# Patient Record
Sex: Female | Born: 1985 | Race: White | Hispanic: No | Marital: Married | Smoking: Former smoker
Health system: Southern US, Community
[De-identification: ages and names within clinical notes are randomized; demographics above are authoritative.]

## PROBLEM LIST (undated history)

## (undated) ENCOUNTER — Inpatient Hospital Stay (HOSPITAL_COMMUNITY): Payer: Self-pay

## (undated) DIAGNOSIS — G43909 Migraine, unspecified, not intractable, without status migrainosus: Secondary | ICD-10-CM

## (undated) DIAGNOSIS — E669 Obesity, unspecified: Secondary | ICD-10-CM

## (undated) DIAGNOSIS — D649 Anemia, unspecified: Secondary | ICD-10-CM

## (undated) DIAGNOSIS — N83209 Unspecified ovarian cyst, unspecified side: Secondary | ICD-10-CM

## (undated) DIAGNOSIS — I729 Aneurysm of unspecified site: Secondary | ICD-10-CM

## (undated) DIAGNOSIS — E079 Disorder of thyroid, unspecified: Secondary | ICD-10-CM

## (undated) DIAGNOSIS — E039 Hypothyroidism, unspecified: Secondary | ICD-10-CM

## (undated) DIAGNOSIS — N63 Unspecified lump in unspecified breast: Secondary | ICD-10-CM

## (undated) DIAGNOSIS — B9689 Other specified bacterial agents as the cause of diseases classified elsewhere: Secondary | ICD-10-CM

## (undated) DIAGNOSIS — N760 Acute vaginitis: Secondary | ICD-10-CM

## (undated) DIAGNOSIS — F32A Depression, unspecified: Secondary | ICD-10-CM

## (undated) DIAGNOSIS — F329 Major depressive disorder, single episode, unspecified: Secondary | ICD-10-CM

## (undated) DIAGNOSIS — K760 Fatty (change of) liver, not elsewhere classified: Secondary | ICD-10-CM

## (undated) DIAGNOSIS — N644 Mastodynia: Secondary | ICD-10-CM

## (undated) DIAGNOSIS — E063 Autoimmune thyroiditis: Secondary | ICD-10-CM

## (undated) DIAGNOSIS — N926 Irregular menstruation, unspecified: Secondary | ICD-10-CM

## (undated) DIAGNOSIS — Z3490 Encounter for supervision of normal pregnancy, unspecified, unspecified trimester: Secondary | ICD-10-CM

## (undated) DIAGNOSIS — K5090 Crohn's disease, unspecified, without complications: Secondary | ICD-10-CM

## (undated) DIAGNOSIS — M8580 Other specified disorders of bone density and structure, unspecified site: Secondary | ICD-10-CM

## (undated) DIAGNOSIS — M4850XA Collapsed vertebra, not elsewhere classified, site unspecified, initial encounter for fracture: Secondary | ICD-10-CM

## (undated) HISTORY — DX: Acute vaginitis: N76.0

## (undated) HISTORY — PX: COLONOSCOPY: SHX174

## (undated) HISTORY — DX: Primary biliary cirrhosis: K74.3

## (undated) HISTORY — DX: Unspecified lump in unspecified breast: N63.0

## (undated) HISTORY — DX: Other specified bacterial agents as the cause of diseases classified elsewhere: B96.89

## (undated) HISTORY — PX: WISDOM TOOTH EXTRACTION: SHX21

## (undated) HISTORY — DX: Autoimmune thyroiditis: E06.3

## (undated) HISTORY — DX: Unspecified ovarian cyst, unspecified side: N83.209

## (undated) HISTORY — DX: Other specified disorders of bone density and structure, unspecified site: M85.80

## (undated) HISTORY — DX: Irregular menstruation, unspecified: N92.6

## (undated) HISTORY — DX: Obesity, unspecified: E66.9

## (undated) HISTORY — DX: Mastodynia: N64.4

## (undated) HISTORY — DX: Fatty (change of) liver, not elsewhere classified: K76.0

## (undated) HISTORY — DX: Aneurysm of unspecified site: I72.9

## (undated) HISTORY — PX: TONSILLECTOMY: SUR1361

## (undated) HISTORY — DX: Hypothyroidism, unspecified: E03.9

## (undated) HISTORY — DX: Disorder of thyroid, unspecified: E07.9

## (undated) HISTORY — DX: Encounter for supervision of normal pregnancy, unspecified, unspecified trimester: Z34.90

## (undated) HISTORY — DX: Collapsed vertebra, not elsewhere classified, site unspecified, initial encounter for fracture: M48.50XA

## (undated) HISTORY — DX: Crohn's disease, unspecified, without complications: K50.90

## (undated) HISTORY — DX: Migraine, unspecified, not intractable, without status migrainosus: G43.909

## (undated) HISTORY — DX: Anemia, unspecified: D64.9

---

## 2007-07-23 ENCOUNTER — Other Ambulatory Visit: Admission: RE | Admit: 2007-07-23 | Discharge: 2007-07-23 | Payer: Self-pay | Admitting: Obstetrics and Gynecology

## 2009-10-09 ENCOUNTER — Other Ambulatory Visit: Admission: RE | Admit: 2009-10-09 | Discharge: 2009-10-09 | Payer: Self-pay | Admitting: Obstetrics & Gynecology

## 2009-11-03 ENCOUNTER — Ambulatory Visit (HOSPITAL_COMMUNITY): Admission: RE | Admit: 2009-11-03 | Discharge: 2009-11-03 | Payer: Self-pay | Admitting: Obstetrics & Gynecology

## 2009-11-08 ENCOUNTER — Ambulatory Visit (HOSPITAL_COMMUNITY): Admission: RE | Admit: 2009-11-08 | Discharge: 2009-11-08 | Payer: Self-pay | Admitting: Obstetrics & Gynecology

## 2009-12-11 ENCOUNTER — Ambulatory Visit (HOSPITAL_COMMUNITY): Admission: RE | Admit: 2009-12-11 | Discharge: 2009-12-11 | Payer: Self-pay | Admitting: Obstetrics & Gynecology

## 2009-12-26 ENCOUNTER — Ambulatory Visit (HOSPITAL_COMMUNITY): Admission: RE | Admit: 2009-12-26 | Discharge: 2009-12-26 | Payer: Self-pay | Admitting: Obstetrics & Gynecology

## 2009-12-27 ENCOUNTER — Inpatient Hospital Stay (HOSPITAL_COMMUNITY): Admission: AD | Admit: 2009-12-27 | Discharge: 2009-12-28 | Payer: Self-pay | Admitting: Obstetrics and Gynecology

## 2010-01-09 ENCOUNTER — Ambulatory Visit (HOSPITAL_COMMUNITY)
Admission: RE | Admit: 2010-01-09 | Discharge: 2010-01-09 | Payer: Self-pay | Source: Home / Self Care | Admitting: Dentistry

## 2010-01-23 ENCOUNTER — Ambulatory Visit (HOSPITAL_COMMUNITY)
Admission: RE | Admit: 2010-01-23 | Discharge: 2010-01-23 | Payer: Self-pay | Source: Home / Self Care | Attending: Obstetrics & Gynecology | Admitting: Obstetrics & Gynecology

## 2010-02-08 ENCOUNTER — Ambulatory Visit (HOSPITAL_COMMUNITY)
Admission: RE | Admit: 2010-02-08 | Discharge: 2010-02-08 | Payer: Self-pay | Source: Home / Self Care | Attending: Obstetrics & Gynecology | Admitting: Obstetrics & Gynecology

## 2010-02-22 ENCOUNTER — Ambulatory Visit (HOSPITAL_COMMUNITY)
Admission: RE | Admit: 2010-02-22 | Discharge: 2010-02-22 | Payer: Self-pay | Source: Home / Self Care | Attending: Obstetrics & Gynecology | Admitting: Obstetrics & Gynecology

## 2010-03-08 ENCOUNTER — Ambulatory Visit (HOSPITAL_COMMUNITY)
Admission: RE | Admit: 2010-03-08 | Discharge: 2010-03-08 | Payer: Self-pay | Source: Home / Self Care | Attending: Obstetrics & Gynecology | Admitting: Obstetrics & Gynecology

## 2010-03-08 ENCOUNTER — Other Ambulatory Visit: Payer: Self-pay | Admitting: Obstetrics & Gynecology

## 2010-03-08 DIAGNOSIS — IMO0001 Reserved for inherently not codable concepts without codable children: Secondary | ICD-10-CM

## 2010-03-29 ENCOUNTER — Ambulatory Visit (HOSPITAL_COMMUNITY): Payer: Self-pay

## 2010-03-29 ENCOUNTER — Other Ambulatory Visit: Payer: Self-pay | Admitting: Obstetrics & Gynecology

## 2010-03-29 ENCOUNTER — Ambulatory Visit (HOSPITAL_COMMUNITY)
Admission: RE | Admit: 2010-03-29 | Discharge: 2010-03-29 | Disposition: A | Discharge status: Home / Self Care | Payer: 59 | Source: Ambulatory Visit | Attending: Obstetrics & Gynecology | Admitting: Obstetrics & Gynecology

## 2010-03-29 DIAGNOSIS — O30049 Twin pregnancy, dichorionic/diamniotic, unspecified trimester: Secondary | ICD-10-CM

## 2010-03-29 DIAGNOSIS — IMO0001 Reserved for inherently not codable concepts without codable children: Secondary | ICD-10-CM

## 2010-03-29 DIAGNOSIS — Z3689 Encounter for other specified antenatal screening: Secondary | ICD-10-CM | POA: Insufficient documentation

## 2010-03-29 DIAGNOSIS — O30009 Twin pregnancy, unspecified number of placenta and unspecified number of amniotic sacs, unspecified trimester: Secondary | ICD-10-CM | POA: Insufficient documentation

## 2010-03-29 DIAGNOSIS — O26849 Uterine size-date discrepancy, unspecified trimester: Secondary | ICD-10-CM

## 2010-04-03 ENCOUNTER — Inpatient Hospital Stay (HOSPITAL_COMMUNITY)
Admission: AD | Admit: 2010-04-03 | Discharge: 2010-04-03 | Disposition: A | Discharge status: Home / Self Care | Payer: 59 | Source: Ambulatory Visit | Attending: Obstetrics & Gynecology | Admitting: Obstetrics & Gynecology

## 2010-04-03 DIAGNOSIS — F410 Panic disorder [episodic paroxysmal anxiety] without agoraphobia: Secondary | ICD-10-CM

## 2010-04-03 DIAGNOSIS — O99340 Other mental disorders complicating pregnancy, unspecified trimester: Secondary | ICD-10-CM

## 2010-04-19 ENCOUNTER — Ambulatory Visit (HOSPITAL_COMMUNITY)
Admission: RE | Admit: 2010-04-19 | Discharge: 2010-04-19 | Disposition: A | Discharge status: Home / Self Care | Payer: 59 | Source: Ambulatory Visit | Attending: Obstetrics & Gynecology | Admitting: Obstetrics & Gynecology

## 2010-04-19 DIAGNOSIS — IMO0001 Reserved for inherently not codable concepts without codable children: Secondary | ICD-10-CM

## 2010-04-19 DIAGNOSIS — O30009 Twin pregnancy, unspecified number of placenta and unspecified number of amniotic sacs, unspecified trimester: Secondary | ICD-10-CM | POA: Insufficient documentation

## 2010-04-19 DIAGNOSIS — Z3689 Encounter for other specified antenatal screening: Secondary | ICD-10-CM | POA: Insufficient documentation

## 2010-04-24 LAB — GC/CHLAMYDIA PROBE AMP, GENITAL
Chlamydia, DNA Probe: NEGATIVE
GC Probe Amp, Genital: NEGATIVE

## 2010-04-24 LAB — URINALYSIS, ROUTINE W REFLEX MICROSCOPIC
Bilirubin Urine: NEGATIVE
Glucose, UA: NEGATIVE mg/dL
Hgb urine dipstick: NEGATIVE
Specific Gravity, Urine: 1.01 (ref 1.005–1.030)
pH: 6 (ref 5.0–8.0)

## 2010-04-24 LAB — URINE MICROSCOPIC-ADD ON

## 2010-04-24 LAB — WET PREP, GENITAL

## 2010-04-30 ENCOUNTER — Other Ambulatory Visit (HOSPITAL_COMMUNITY): Payer: 59

## 2010-05-01 ENCOUNTER — Other Ambulatory Visit (HOSPITAL_COMMUNITY): Payer: 59

## 2010-05-01 ENCOUNTER — Encounter (HOSPITAL_COMMUNITY)
Admission: RE | Admit: 2010-05-01 | Discharge: 2010-05-01 | Disposition: A | Discharge status: Home / Self Care | Payer: 59 | Source: Ambulatory Visit | Attending: Obstetrics and Gynecology | Admitting: Obstetrics and Gynecology

## 2010-05-01 DIAGNOSIS — Z01812 Encounter for preprocedural laboratory examination: Secondary | ICD-10-CM | POA: Insufficient documentation

## 2010-05-01 LAB — CBC
HCT: 31.9 % — ABNORMAL LOW (ref 36.0–46.0)
Hemoglobin: 9.5 g/dL — ABNORMAL LOW (ref 12.0–15.0)
WBC: 11.1 10*3/uL — ABNORMAL HIGH (ref 4.0–10.5)

## 2010-05-01 LAB — RPR: RPR Ser Ql: NONREACTIVE

## 2010-05-01 LAB — SURGICAL PCR SCREEN: MRSA, PCR: NEGATIVE

## 2010-05-04 ENCOUNTER — Inpatient Hospital Stay (HOSPITAL_COMMUNITY)
Admission: RE | Admit: 2010-05-04 | Discharge: 2010-05-07 | DRG: 766 | Disposition: A | Discharge status: Home / Self Care | Payer: 59 | Source: Ambulatory Visit | Attending: Obstetrics and Gynecology | Admitting: Obstetrics and Gynecology

## 2010-05-04 ENCOUNTER — Inpatient Hospital Stay (HOSPITAL_COMMUNITY): Admission: RE | Admit: 2010-05-04 | Payer: 59 | Source: Ambulatory Visit | Admitting: Obstetrics and Gynecology

## 2010-05-04 DIAGNOSIS — O30039 Twin pregnancy, monochorionic/diamniotic, unspecified trimester: Secondary | ICD-10-CM

## 2010-05-04 DIAGNOSIS — Z01812 Encounter for preprocedural laboratory examination: Secondary | ICD-10-CM

## 2010-05-04 DIAGNOSIS — O3090 Multiple gestation, unspecified, unspecified trimester: Principal | ICD-10-CM | POA: Diagnosis present

## 2010-05-04 DIAGNOSIS — Z01818 Encounter for other preprocedural examination: Secondary | ICD-10-CM

## 2010-05-04 DIAGNOSIS — O30009 Twin pregnancy, unspecified number of placenta and unspecified number of amniotic sacs, unspecified trimester: Secondary | ICD-10-CM

## 2010-05-04 DIAGNOSIS — O329XX0 Maternal care for malpresentation of fetus, unspecified, not applicable or unspecified: Secondary | ICD-10-CM

## 2010-05-04 LAB — TYPE AND SCREEN
ABO/RH(D): O POS
Antibody Screen: NEGATIVE

## 2010-05-05 LAB — CBC
MCH: 23.3 pg — ABNORMAL LOW (ref 26.0–34.0)
MCHC: 30 g/dL (ref 30.0–36.0)
Platelets: 160 10*3/uL (ref 150–400)
RDW: 16 % — ABNORMAL HIGH (ref 11.5–15.5)

## 2010-05-06 NOTE — H&P (Signed)
+NAME:  [name], [name] NO.:  [id number]  MEDICAL RECORD NO.:  [id number]           PATIENT TYPE:  LOCATION:                                [facility]  [facility]  PHYSICIAN:  Tilda Burrow, M.D. DATE OF BIRTH:  17-Jan-1986  DATE OF ADMISSION:  05/04/2010 DATE OF DISCHARGE:  LH                             HISTORY & PHYSICAL   ADMITTING DIAGNOSIS:  Twin pregnancy, 38 weeks and 1 day, diamniotic monochorionic twins with symmetric growth.  Breech baby A.  Scheduled for primary cesarean section.  HISTORY OF PRESENT ILLNESS:  This 25 year old female, gravida 1, para 0- 0-0-0, LMP is August 01, 2009, placing menstrual [facility] May 08, 2010, with first trimester ultrasound 6 weeks 4 days on September 25, 2009, indicating [facility] of May 17, 2010, with ultrasound 10 weeks and 1 day on October 17, 2009, suggesting [facility] of May 14, 2010.  She will be 38 weeks and 1 day by the most reliable earliest ultrasound, and is therefore scheduled for primary cesarean section at 38 weeks and 1 day.  Prenatal course has been followed with serial [facility] with baby A being remaining breech with estimated fetal weight of 49% at 33 weeks.  The baby B was larger at the 71 percentile. The patient is not interested in vaginal delivery even if the babies convert position.  She is, therefore, scheduled for primary cesarean section on May 04, 2010.  Risks of procedure had been reviewed with the patient.  PAST MEDICAL HISTORY:  Positive for headaches, migraines without aura. She has had cold sores orally consistent with HSV-1.  SURGICAL HISTORY:  Positive for tonsillectomy and dental extractions.  SOCIAL HISTORY:  She is a nonsmoker, nondrinker.  Denies recreational drugs.  She is single, lives with baby's father, she is unemployed.  ALLERGIES:  She has an allergy to MAXALT and IMITREX.  MEDICATIONS:  Celexa, intermittently taking naproxen prior to  pregnancy.  FAMILY HISTORY:  Positive for hypertension, diabetes, stroke, heart attacks, and breast cancer.  PRENATAL COURSE:  Notable for serial ultrasounds through [facility] with acceptable growth.  The discordancy and estimated fetal weight is 11% at most recent ultrasound.  PRENATAL LABS:  Blood type O+, rubella immunity present.  Hemoglobin 12, hematocrit 38.  Hepatitis, HIV, RPR, GC/Chlamydia, and hepatitis C all negative.  She has had a Pap smear that was normal.  She has had steady fundal height growth and reactive NSTs done twice weekly on the babies.  PHYSICAL EXAM:  Height 5 feet 2 inches, weight 244.2, blood pressure 104/60 with 42 to 44 cm fundal height, with breech baby A and vertex B. Reactive NSTs with cervix long, thick, and closed.  Group B strep obtained, April 23, 2010.  Extremities are without cyanosis, clubbing, or edema.  ASSESSMENT:  Pregnancy at 65 plus weeks' gestation, twin gestation, breech baby A.  Fetal malpresentation, therefore, scheduled for cesarean section.     Tilda Burrow, M.D.     JVF/MEDQ  D:  04/27/2010  T:  04/28/2010  Job:  [id number]  cc:   [facility]  Electronically Signed  by Christin Bach M.D. on 05/06/2010 09:25:14 PM

## 2010-05-07 ENCOUNTER — Other Ambulatory Visit: Payer: Self-pay | Admitting: Obstetrics and Gynecology

## 2010-05-18 ENCOUNTER — Emergency Department (HOSPITAL_COMMUNITY)
Admission: EM | Admit: 2010-05-18 | Discharge: 2010-05-18 | Disposition: A | Discharge status: Home / Self Care | Payer: 59 | Attending: Emergency Medicine | Admitting: Emergency Medicine

## 2010-05-18 DIAGNOSIS — L299 Pruritus, unspecified: Secondary | ICD-10-CM | POA: Insufficient documentation

## 2010-05-18 DIAGNOSIS — L03319 Cellulitis of trunk, unspecified: Secondary | ICD-10-CM | POA: Insufficient documentation

## 2010-05-18 DIAGNOSIS — Z79899 Other long term (current) drug therapy: Secondary | ICD-10-CM | POA: Insufficient documentation

## 2010-05-18 DIAGNOSIS — L02219 Cutaneous abscess of trunk, unspecified: Secondary | ICD-10-CM | POA: Insufficient documentation

## 2010-05-18 LAB — DIFFERENTIAL
Basophils Relative: 1 % (ref 0–1)
Eosinophils Absolute: 0.8 10*3/uL — ABNORMAL HIGH (ref 0.0–0.7)
Monocytes Relative: 5 % (ref 3–12)
Neutrophils Relative %: 49 % (ref 43–77)

## 2010-05-18 LAB — CBC
MCH: 24.7 pg — ABNORMAL LOW (ref 26.0–34.0)
Platelets: 343 10*3/uL (ref 150–400)
RBC: 4.94 MIL/uL (ref 3.87–5.11)

## 2010-05-18 LAB — BASIC METABOLIC PANEL
Calcium: 8.8 mg/dL (ref 8.4–10.5)
Chloride: 103 mEq/L (ref 96–112)
Creatinine, Ser: 0.66 mg/dL (ref 0.4–1.2)
GFR calc Af Amer: >60 mL/min (ref >60)

## 2010-05-21 NOTE — Discharge Summary (Signed)
  NAMEJENNYLEE, [name]             ACCOUNT NO.:  [id number]  MEDICAL RECORD NO.:  [id number]           PATIENT TYPE:  I  LOCATION:  9105                          [facility]  [facility]  PHYSICIAN:  Tilda Burrow, M.D. DATE OF BIRTH:  1985-10-23  DATE OF ADMISSION:  05/04/2010 DATE OF DISCHARGE:  05/07/2010                              DISCHARGE SUMMARY   REASON FOR ADMISSION:  Twin pregnancy at 38 weeks and 1 day with vertex and breech presentation for low transverse cesarean section.  PROCEDURE:  The patient had a low transverse cesarean section by Dr. Emelda Fear and Dr. Orvan Falconer with 2 viable infants, female x2 with Apgars of 8 and 9 for baby A and have Apgars of 9 and 9 for baby B.  Postop course has been uneventful.  The patient is up ambulating well, taking p.o. fluids and solids well, and is ready for discharge today. She has been afebrile.  Discharge hemoglobin is 8.1, discharge hematocrit 27, white count 12.9.  DIET:  Diet as tolerated.  ACTIVITY LEVEL:  No heavy lifting or driving for at least 2 weeks.  FOLLOWUP:  She is to follow up at [facility] in 1 week for an incision check or p.r.n. any problems.  PHYSICAL EXAMINATION:  VITAL SIGNS:  Today, vital signs were stable. CARDIAC:  Heart is regular rhythm and rate. LUNGS:  Clear to auscultation bilaterally. ABDOMEN:  Soft.  Bowel sounds present x4.  Incision is intact.  There is no redness, swelling, or drainage.  Fundus is firm.  Lochia scant amount. EXTREMITIES:  Trace edema in the lower extremities.  ASSESSMENT:  Stable postop day #3.  PLAN:  We are going to discharge her home.  Follow up with [facility] in 1 week.  DISCHARGE MEDICATIONS:  As follows: 1. Percocet 5/325 one p.o. q.4 hours p.r.n. pain. 2. Motrin 600 p.o. q.6 hours p.r.n. cramping. 3. FeSO4 325 one p.o. b.i.d.     Zerita Boers, N.M.   ______________________________ Tilda Burrow, M.D.    DL/MEDQ  D:  78/29/5621  T:  05/08/2010   Job:  [id number]  cc:   [facility]  Electronically Signed by Wyvonnia Dusky N.M. on 05/13/2010 10:33:08 AM Electronically Signed by Christin Bach M.D. on 05/21/2010 05:21:29 PM

## 2010-05-21 NOTE — Op Note (Addendum)
[name], [name]             ACCOUNT NO.:  [id number]  MEDICAL RECORD NO.:  [id number]           PATIENT TYPE:  I  LOCATION:  9105                          [facility]  PHYSICIAN:  Tilda Burrow, M.D. DATE OF BIRTH:  1985-07-24  DATE OF PROCEDURE:  05/04/2010 DATE OF DISCHARGE:                              OPERATIVE REPORT   PREOPERATIVE DIAGNOSES: 1. A twin mono-di intrauterine pregnancy at 38 weeks and 1 day. 2. Breech twin A.  POSTOPERATIVE DIAGNOSES: 1. A twin mono-di intrauterine pregnancy at 38 weeks and 1 day. 2. Breech twin A.  PROCEDURE:  Primary low transverse cesarean section via Pfannenstiel.  SURGEON:  Dr. Emelda Fear and Dr. Orvan Falconer.  ANESTHESIA:  Spinal.  IV FLUIDS:  20000 mL.  URINE OUTPUT:  100 mL of clear urine at the end of procedure.  ESTIMATED BLOOD LOSS:  1000 mL.  FINDING:  Viable female infant x2, both in frank breech presentation x2 with clear amniotic fluid x2.  Baby A Apgars were 8 and 9 with a weight 6 pounds 6 ounces, baby B Apgars 9 and 9 with a weight 6 pounds 10 ounces.  Normal uterus and adnexa bilaterally.  SPECIMENS:  Placenta was sent to Pathology.  COMPLICATIONS:  None immediate.  INDICATIONS:  A 25 year old gravida 1, para 0 with an intrauterine pregnancy with mono-di twins, breech twin A who presented with a primary low transverse cesarean section who is currently not in labor for breech presentation and twin intrauterine pregnancy.  PROCEDURE NOTE IN DETAIL:  After informed consent was obtained, the patient was taken back to the operative suite where spinal anesthesia was placed after anesthesia was found to be adequate.  The patient was placed in dorsal supine position with leftward tilt.  The patient was prepped and draped in normal sterile fashion.  Again anesthesia was tested, was found to be adequate.  A Pfannenstiel skin incision was then made with scalpel and carried down through to the underlying  fascia. The fascia was then incised in the midline.  The fascial incision then extended laterally with the Mayo scissors.  The superior aspect of the fascial incision was then grasped with Kocher clamps, elevated, tented up and the rectus muscle was then dissected off bluntly.  Attention was then turned inferiorly to the lower edge of the fascial incision which was grasped with Kocher clamps, elevated, tented up and the rectus muscle then dissected off bluntly.  The rectus muscle was then separated in the midline.  The peritoneum was then entered bluntly.  The incision was then extended manually.  The bladder blade was then inserted in the vesicouterine.  Peritoneum was then identified, grasped with pickups and entered sharply with Metzenbaum scissors.  The bladder flap was then created digitally.  The bladder blade was then reinserted.  The lower uterine segment was then incised in a transverse fashion with a scalpel. The incision was tented laterally.  The.  Amniotomy was then performed, notable with clear fluid.  Baby A was delivered in frank breech presentation and otherwise atraumatically, mouth and nose were bulb suctioned.  Cord was cut and clamped and the infant was  handed off to awaiting NICU.  Apgars were 8 and 9, weight was 6 pounds 6 ounces. Subsequently baby B's amniotomy was then performed, was also found to be in frank breech presentation, was delivered otherwise atraumatically. Mouth and nose were bulb suctioned.  Cord was cut and clamped and infant was handed off to awaiting NICU.  Apgars were 9 and 9.  Weight was 6 pounds 10 ounces.  Cord blood was then sampled from both babies. Placenta was then delivered spontaneously intact with three-vessel cord for both babies.  The uterus was then cleared of all clots and debribed. The uterine incision was then repaired using a 0 chromic in a running locking fashion.  The second layer was then imbricated in a running fashion.  The  uterine incision was found to be hemostatic.  Bilateral adnexa were then visualized, were found to be normal.  The peritoneum was then cleared of all clots and debris.  The peritoneum was then reapproximated using 2-0 Vicryl.  The rectus muscles were then reapproximated as well using the same 2-0 Vicryl with an interrupted sutures.  The fascial incision was then closed using a O Vicryl in a running fashion.  The subcutaneous tissue was then irrigated, additional hemostasis was then found with the electrocautery.  A plain gut suture was then used for reapproximation x3 of the subcutaneous tissues.  The skin was then closed using a 4-0 Vicryl in a subcuticular fashion. Fascial incision was found to be reapproximated well.  Lap, needle, instrument and sponge counts were correct x2.  The patient received antibiotics perioperatively.  The patient was taken back to the recovery room in stable condition.    ______________________________ Maryelizabeth Kaufmann, MD   ______________________________ Tilda Burrow, M.D.    LC/MEDQ  D:  05/04/2010  T:  05/05/2010  Job:  [id number]  Electronically Signed by Maryelizabeth Kaufmann MD on 06/06/2010 03:46:44 PM Electronically Signed by Christin Bach M.D. on 06/10/2010 11:59:48 AM

## 2010-11-13 ENCOUNTER — Other Ambulatory Visit: Payer: Self-pay | Admitting: Adult Health

## 2010-11-13 ENCOUNTER — Other Ambulatory Visit (HOSPITAL_COMMUNITY)
Admission: RE | Admit: 2010-11-13 | Discharge: 2010-11-13 | Disposition: A | Discharge status: Home / Self Care | Payer: 59 | Source: Ambulatory Visit | Attending: Obstetrics and Gynecology | Admitting: Obstetrics and Gynecology

## 2010-11-13 DIAGNOSIS — Z01419 Encounter for gynecological examination (general) (routine) without abnormal findings: Secondary | ICD-10-CM | POA: Insufficient documentation

## 2010-11-13 DIAGNOSIS — Z113 Encounter for screening for infections with a predominantly sexual mode of transmission: Secondary | ICD-10-CM | POA: Insufficient documentation

## 2011-07-30 ENCOUNTER — Other Ambulatory Visit: Payer: Self-pay | Admitting: Advanced Practice Midwife

## 2011-08-01 ENCOUNTER — Other Ambulatory Visit: Payer: Self-pay | Admitting: Advanced Practice Midwife

## 2012-01-22 ENCOUNTER — Other Ambulatory Visit: Payer: Self-pay | Admitting: Adult Health

## 2012-01-22 ENCOUNTER — Other Ambulatory Visit (HOSPITAL_COMMUNITY)
Admission: RE | Admit: 2012-01-22 | Discharge: 2012-01-22 | Disposition: A | Discharge status: Home / Self Care | Payer: 59 | Source: Ambulatory Visit | Attending: Obstetrics and Gynecology | Admitting: Obstetrics and Gynecology

## 2012-01-22 DIAGNOSIS — Z01419 Encounter for gynecological examination (general) (routine) without abnormal findings: Secondary | ICD-10-CM | POA: Insufficient documentation

## 2012-01-22 DIAGNOSIS — Z113 Encounter for screening for infections with a predominantly sexual mode of transmission: Secondary | ICD-10-CM | POA: Insufficient documentation

## 2012-09-10 ENCOUNTER — Encounter: Payer: Self-pay | Admitting: Adult Health

## 2012-09-10 ENCOUNTER — Ambulatory Visit (INDEPENDENT_AMBULATORY_CARE_PROVIDER_SITE_OTHER): Payer: Medicare PPO | Admitting: Adult Health

## 2012-09-10 VITALS — BP 120/80 | Ht 63.0 in | Wt 267.0 lb

## 2012-09-10 DIAGNOSIS — Z3200 Encounter for pregnancy test, result unknown: Secondary | ICD-10-CM

## 2012-09-10 DIAGNOSIS — Z3202 Encounter for pregnancy test, result negative: Secondary | ICD-10-CM

## 2012-09-10 LAB — POCT URINE PREGNANCY: Preg Test, Ur: NEGATIVE

## 2012-09-10 NOTE — Progress Notes (Signed)
Patient ID: [name], female   DOB: Nov 10, 1985, 27 y.o.   MRN: [id number] Pt here today for UPT. UPT is negative. Pt states that she has been having normal periods every month but nothing this month. Pt is not on any BC. We will do a QHCG today to be sure.

## 2012-09-11 ENCOUNTER — Telehealth: Payer: Self-pay | Admitting: Adult Health

## 2012-09-11 LAB — HCG, QUANTITATIVE, PREGNANCY: hCG, Beta Chain, Quant, S: <2 m[IU]/mL

## 2012-09-11 NOTE — Telephone Encounter (Signed)
Pt informed of negative QHCG results.

## 2012-11-10 ENCOUNTER — Ambulatory Visit: Payer: 59 | Admitting: Adult Health

## 2012-11-12 ENCOUNTER — Encounter: Payer: Self-pay | Admitting: Adult Health

## 2012-11-12 ENCOUNTER — Ambulatory Visit (INDEPENDENT_AMBULATORY_CARE_PROVIDER_SITE_OTHER): Payer: Medicare PPO | Admitting: Adult Health

## 2012-11-12 VITALS — BP 112/66 | Ht 62.0 in | Wt 270.0 lb

## 2012-11-12 DIAGNOSIS — A499 Bacterial infection, unspecified: Secondary | ICD-10-CM

## 2012-11-12 DIAGNOSIS — N760 Acute vaginitis: Secondary | ICD-10-CM

## 2012-11-12 DIAGNOSIS — N898 Other specified noninflammatory disorders of vagina: Secondary | ICD-10-CM | POA: Insufficient documentation

## 2012-11-12 DIAGNOSIS — N949 Unspecified condition associated with female genital organs and menstrual cycle: Secondary | ICD-10-CM

## 2012-11-12 DIAGNOSIS — B9689 Other specified bacterial agents as the cause of diseases classified elsewhere: Secondary | ICD-10-CM

## 2012-11-12 DIAGNOSIS — R102 Pelvic and perineal pain: Secondary | ICD-10-CM | POA: Insufficient documentation

## 2012-11-12 HISTORY — DX: Other specified bacterial agents as the cause of diseases classified elsewhere: B96.89

## 2012-11-12 LAB — POCT WET PREP (WET MOUNT)

## 2012-11-12 MED ORDER — METRONIDAZOLE 500 MG PO TABS: 500.0000 mg | ORAL_TABLET | Freq: Two times a day (BID) | ORAL | Status: DC

## 2012-11-12 NOTE — Progress Notes (Signed)
Subjective:     Patient ID: [name], female   DOB: Jan 23, 1986, 27 y.o.   MRN: [id number]  HPI [name] is a 27 year old white female in complaining of vaginal pain and tampons hurt.She says sex is uncomfortable too, she missed a period in July then had one in august and then September heavy. Uses condoms.  Review of Systems See HPI Reviewed past medical,surgical, social and family history. Reviewed medications and allergies.     Objective:   Physical Exam BP 112/66  Ht 5\' 2"  (1.575 m)  Wt 270 lb (122.471 kg)  BMI 49.37 kg/m2  LMP 10/24/2012   Skin warm and dry.Pelvic: external genitalia is normal in appearance, vagina: yellowish creamy discharge with odor, cervix:smooth, uterus: normal size, shape and contour, non tender, no masses felt, adnexa: no masses or tenderness noted. Wet prep: + for clue cells and +WBCs. Has some folliculitis in groin.  Assessment:     Vaginal pain Vaginal discharge BV    Plan:     Rx flagyl 500 mg 1 bid x 7 days, no sex or alcohol x 10 days   Review handout on BV Use dial soap No shaving Follow up prn call any problems

## 2012-11-12 NOTE — Patient Instructions (Addendum)
Bacterial Vaginosis Bacterial vaginosis (BV) is a vaginal infection where the normal balance of bacteria in the vagina is disrupted. The normal balance is then replaced by an overgrowth of certain bacteria. There are several different kinds of bacteria that can cause BV. BV is the most common vaginal infection in women of childbearing age. CAUSES   The cause of BV is not fully understood. BV develops when there is an increase or imbalance of harmful bacteria.  Some activities or behaviors can upset the normal balance of bacteria in the vagina and put women at increased risk including:  Having a new sex partner or multiple sex partners.  Douching.  Using an intrauterine device (IUD) for contraception.  It is not clear what role sexual activity plays in the development of BV. However, women that have never had sexual intercourse are rarely infected with BV. Women do not get BV from toilet seats, bedding, swimming pools or from touching objects around them.  SYMPTOMS   Grey vaginal discharge.  A fish-like odor with discharge, especially after sexual intercourse.  Itching or burning of the vagina and vulva.  Burning or pain with urination.  Some women have no signs or symptoms at all. DIAGNOSIS  Your caregiver must examine the vagina for signs of BV. Your caregiver will perform lab tests and look at the sample of vaginal fluid through a microscope. They will look for bacteria and abnormal cells (clue cells), a pH test higher than 4.5, and a positive amine test all associated with BV.  RISKS AND COMPLICATIONS   Pelvic inflammatory disease (PID).  Infections following gynecology surgery.  Developing HIV.  Developing herpes virus. TREATMENT  Sometimes BV will clear up without treatment. However, all women with symptoms of BV should be treated to avoid complications, especially if gynecology surgery is planned. Female partners generally do not need to be treated. However, BV may spread  between female sex partners so treatment is helpful in preventing a recurrence of BV.   BV may be treated with antibiotics. The antibiotics come in either pill or vaginal cream forms. Either can be used with nonpregnant or pregnant women, but the recommended dosages differ. These antibiotics are not harmful to the baby.  BV can recur after treatment. If this happens, a second round of antibiotics will often be prescribed.  Treatment is important for pregnant women. If not treated, BV can cause a premature delivery, especially for a pregnant woman who had a premature birth in the past. All pregnant women who have symptoms of BV should be checked and treated.  For chronic reoccurrence of BV, treatment with a type of prescribed gel vaginally twice a week is helpful. HOME CARE INSTRUCTIONS   Finish all medication as directed by your caregiver.  Do not have sex until treatment is completed.  Tell your sexual partner that you have a vaginal infection. They should see their caregiver and be treated if they have problems, such as a mild rash or itching.  Practice safe sex. Use condoms. Only have 1 sex partner. PREVENTION  Basic prevention steps can help reduce the risk of upsetting the natural balance of bacteria in the vagina and developing BV:  Do not have sexual intercourse (be abstinent).  Do not douche.  Use all of the medicine prescribed for treatment of BV, even if the signs and symptoms go away.  Tell your sex partner if you have BV. That way, they can be treated, if needed, to prevent reoccurrence. SEEK MEDICAL CARE IF:     Your symptoms are not improving after 3 days of treatment.  You have increased discharge, pain, or fever. MAKE SURE YOU:   Understand these instructions.  Will watch your condition.  Will get help right away if you are not doing well or get worse. FOR MORE INFORMATION  Division of STD Prevention (DSTDP), Centers for Disease Control and Prevention:  SolutionApps.co.za American Social Health Association (ASHA): www.ashastd.org  Document Released: 01/28/2005 Document Revised: 04/22/2011 Document Reviewed: 07/21/2008 [facility] Patient Information 2014 [place], [place]. Try rephresh Follow up prn

## 2013-05-21 ENCOUNTER — Encounter: Payer: Self-pay | Admitting: Adult Health

## 2013-05-21 ENCOUNTER — Ambulatory Visit (INDEPENDENT_AMBULATORY_CARE_PROVIDER_SITE_OTHER): Payer: BC Managed Care – PPO | Admitting: Adult Health

## 2013-05-21 VITALS — BP 102/62 | Ht 62.0 in | Wt 272.5 lb

## 2013-05-21 DIAGNOSIS — N760 Acute vaginitis: Secondary | ICD-10-CM

## 2013-05-21 DIAGNOSIS — B9689 Other specified bacterial agents as the cause of diseases classified elsewhere: Secondary | ICD-10-CM

## 2013-05-21 DIAGNOSIS — A499 Bacterial infection, unspecified: Secondary | ICD-10-CM

## 2013-05-21 DIAGNOSIS — N898 Other specified noninflammatory disorders of vagina: Secondary | ICD-10-CM

## 2013-05-21 DIAGNOSIS — N926 Irregular menstruation, unspecified: Secondary | ICD-10-CM | POA: Insufficient documentation

## 2013-05-21 HISTORY — DX: Irregular menstruation, unspecified: N92.6

## 2013-05-21 LAB — POCT WET PREP (WET MOUNT): WBC WET PREP: POSITIVE

## 2013-05-21 LAB — POCT URINE PREGNANCY: Preg Test, Ur: NEGATIVE

## 2013-05-21 MED ORDER — METRONIDAZOLE 500 MG PO TABS: 500.0000 mg | ORAL_TABLET | Freq: Two times a day (BID) | ORAL | Status: DC

## 2013-05-21 NOTE — Patient Instructions (Addendum)
Bacterial Vaginosis Bacterial vaginosis is a vaginal infection that occurs when the normal balance of bacteria in the vagina is disrupted. It results from an overgrowth of certain bacteria. This is the most common vaginal infection in women of childbearing age. Treatment is important to prevent complications, especially in pregnant women, as it can cause a premature delivery. CAUSES  Bacterial vaginosis is caused by an increase in harmful bacteria that are normally present in smaller amounts in the vagina. Several different kinds of bacteria can cause bacterial vaginosis. However, the reason that the condition develops is not fully understood. RISK FACTORS Certain activities or behaviors can put you at an increased risk of developing bacterial vaginosis, including:  Having a new sex partner or multiple sex partners.  Douching.  Using an intrauterine device (IUD) for contraception. Women do not get bacterial vaginosis from toilet seats, bedding, swimming pools, or contact with objects around them. SIGNS AND SYMPTOMS  Some women with bacterial vaginosis have no signs or symptoms. Common symptoms include:  Grey vaginal discharge.  A fishlike odor with discharge, especially after sexual intercourse.  Itching or burning of the vagina and vulva.  Burning or pain with urination. DIAGNOSIS  Your health care provider will take a medical history and examine the vagina for signs of bacterial vaginosis. A sample of vaginal fluid may be taken. Your health care provider will look at this sample under a microscope to check for bacteria and abnormal cells. A vaginal pH test may also be done.  TREATMENT  Bacterial vaginosis may be treated with antibiotic medicines. These may be given in the form of a pill or a vaginal cream. A second round of antibiotics may be prescribed if the condition comes back after treatment.  HOME CARE INSTRUCTIONS   Only take over-the-counter or prescription medicines as  directed by your health care provider.  If antibiotic medicine was prescribed, take it as directed. Make sure you finish it even if you start to feel better.  Do not have sex until treatment is completed.  Tell all sexual partners that you have a vaginal infection. They should see their health care provider and be treated if they have problems, such as a mild rash or itching.  Practice safe sex by using condoms and only having one sex partner. SEEK MEDICAL CARE IF:   Your symptoms are not improving after 3 days of treatment.  You have increased discharge or pain.  You have a fever. MAKE SURE YOU:   Understand these instructions.  Will watch your condition.  Will get help right away if you are not doing well or get worse. FOR MORE INFORMATION  Centers for Disease Control and Prevention, Division of STD Prevention: SolutionApps.co.zawww.cdc.gov/std American Sexual Health Association (ASHA): www.ashastd.org  Document Released: 01/28/2005 Document Revised: 11/18/2012 Document Reviewed: 09/09/2012 [facility] Patient Information 2014 [place]. Take flagyl Return in 1 week for [place]

## 2013-05-21 NOTE — Progress Notes (Signed)
Subjective:     Patient ID: [name], female   DOB: 15-Feb-1985, 28 y.o.   MRN: [id number]  HPI [name] is a 28 year old white female in complaining of having her period every other month, it lasts 3 days and is light, no pain.This has been occuring since June.Also says can't lose weight.Partner cheated first of year.  Review of Systems See HPI Reviewed past medical,surgical, social and family history. Reviewed medications and allergies.     Objective:   Physical Exam BP 102/62  Ht 5\' 2"  (1.575 m)  Wt 272 lb 8 oz (123.605 kg)  BMI 49.83 kg/m2  LMP 02/12/2015UPT negative, Skin warm and dry.Pelvic: external genitalia is normal in appearance,Has skin tag right groin and top of clitoris,vagina: tannish discharge with odor, cervix:smooth and bulbous, uterus: normal size, shape and contour, mildly tender, no masses felt, adnexa: no masses or tenderness noted. Wet prep: + for clue cells and +WBCs. GC/CHL obtained.    She declines OCs to regulate period at this time.  Assessment:     Irregular periods Vaginal discharge  BV    Plan:     Rx flagyl 500 mg 1 bid x 7 days, no alcohol, review handout on BV   Check GC/CHL and TSH  Return in 1 week for US and see me

## 2013-05-22 LAB — GC/CHLAMYDIA PROBE AMP
CT Probe RNA: NEGATIVE
GC PROBE AMP APTIMA: NEGATIVE

## 2013-05-22 LAB — TSH: TSH: 2.998 u[IU]/mL (ref 0.350–4.500)

## 2013-05-24 ENCOUNTER — Telehealth: Payer: Self-pay | Admitting: Adult Health

## 2013-05-24 NOTE — Telephone Encounter (Signed)
Left message to call.

## 2013-05-31 ENCOUNTER — Encounter: Payer: Self-pay | Admitting: Adult Health

## 2013-05-31 ENCOUNTER — Ambulatory Visit (INDEPENDENT_AMBULATORY_CARE_PROVIDER_SITE_OTHER): Payer: BC Managed Care – PPO | Admitting: Adult Health

## 2013-05-31 ENCOUNTER — Ambulatory Visit (INDEPENDENT_AMBULATORY_CARE_PROVIDER_SITE_OTHER): Payer: BC Managed Care – PPO

## 2013-05-31 VITALS — BP 100/62 | Ht 62.0 in | Wt 271.0 lb

## 2013-05-31 DIAGNOSIS — N801 Endometriosis of ovary: Secondary | ICD-10-CM | POA: Insufficient documentation

## 2013-05-31 DIAGNOSIS — N83209 Unspecified ovarian cyst, unspecified side: Secondary | ICD-10-CM

## 2013-05-31 DIAGNOSIS — N926 Irregular menstruation, unspecified: Secondary | ICD-10-CM

## 2013-05-31 DIAGNOSIS — N80129 Deep endometriosis of ovary, unspecified ovary: Secondary | ICD-10-CM | POA: Insufficient documentation

## 2013-05-31 HISTORY — DX: Unspecified ovarian cyst, unspecified side: N83.209

## 2013-05-31 NOTE — Patient Instructions (Signed)
Ovarian Cystectomy Ovarian cystectomy is surgery to remove a fluid-filled sac (cyst) on an ovary. The ovaries are small organs that produce eggs in women. Various types of cysts can form on the ovaries. Most are not cancerous. Surgery may be done if a cyst is large or is causing symptoms such as pain. It may also be done for a cyst that is or might be cancerous. This surgery can be done using a laparoscopic technique or an open abdominal technique. The laparoscopic technique involves smaller cuts (incisions) and a faster recovery time. The technique used will depend on your age, the type of cyst, and whether the cyst is cancerous. The laparoscopic technique is not used for a cancerous cyst. LET YOUR HEALTH CARE PROVIDER KNOW ABOUT:   Any allergies you have.  All medicines you are taking, including vitamins, herbs, eye drops, creams, and over-the-counter medicines.  Previous problems you or members of your family have had with the use of anesthetics.  Any blood disorders you have.  Previous surgeries you have had.  Medical conditions you have.  Any chance you might be pregnant. RISKS AND COMPLICATIONS Generally, this is a safe procedure. However, as with any procedure, complications can occur. Possible complications include:  Excessive bleeding.  Infection.  Injury to other organs.  Blood clots.  Becoming incapable of getting pregnant (infertile). BEFORE THE PROCEDURE  Ask your health care provider about changing or stopping any regular medicines. Avoid taking aspirin, ibuprofen, or blood thinners as directed by your health care provider.  Do not eat or drink anything after midnight the night before surgery.  If you smoke, do not smoke for at least 2 weeks before your surgery.  Do not drink alcohol the day before your surgery.  Let your health care provider know if you develop a cold or any infection before your surgery.  Arrange for someone to drive you home after the  procedure or after your hospital stay. Also arrange for someone to help you with activities during recovery. PROCEDURE  Either a laparoscopic technique or an open abdominal technique may be used for this surgery.  Small monitors will be put on your body. They are used to check your heart, blood pressure, and oxygen level.   An IV access tube will be put into one of your veins. Medicine will be able to flow directly into your body through this IV tube.   You might be given a medicine to help you relax (sedative).   You will be given a medicine to make you sleep (general anesthetic). A breathing tube may be placed into your lungs during the procedure. Laparoscopic Technique  Several small cuts (incisions) are made in your abdomen. These are typically about 1 to 2 cm long.   Your abdomen will be filled with carbon dioxide gas so that it expands. This gives the surgeon more room to operate and makes your organs easier to see.   A thin, lighted tube with a tiny camera on the end (laparoscope) is put through one of the small incisions. The camera on the laparoscope sends a picture to a TV screen in the operating room. This gives the surgeon a good view inside your abdomen.   Hollow tubes are put through the other small incisions in your abdomen. The tools needed for the procedure are put through these tubes.  The ovary with the cyst is identified, and the cyst is removed. It is sent to the lab for testing. If it is cancer, both ovaries   may need to be removed during a different surgery.  Tools are removed. The incisions are then closed with stitches or skin glue, and dressings may be applied. Open Abdominal Technique  A single large incision is made along your bikini line or in the middle of your lower abdomen.  The ovary with the cyst is identified, and the cyst is removed. It is sent to the lab for testing. If it is cancer, both ovaries may need to be removed during a different  surgery.  The incision is then closed with stitches or staples. AFTER THE PROCEDURE   You will wake up from anesthesia and be taken to a recovery area.  If you had laparoscopic surgery, you may be able to go home the same day, or you may need to stay in the hospital overnight.  If you had open abdominal surgery, you will need to stay in the hospital for a few days.  Your IV access tube and catheter will be removed the first or second day, after you are able to eat and drink enough.  You may be given medicine to relieve pain or to help you sleep.  You may be given an antibiotic medicine if needed. Document Released: 11/25/2006 Document Revised: 11/18/2012 Document Reviewed: 09/09/2012 [facility] Patient Information 2014 [place], [place]. Ovarian Cyst An ovarian cyst is a fluid-filled sac that forms on an ovary. The ovaries are small organs that produce eggs in women. Various types of cysts can form on the ovaries. Most are not cancerous. Many do not cause problems, and they often go away on their own. Some may cause symptoms and require treatment. Common types of ovarian cysts include:  Functional cysts These cysts may occur every month during the menstrual cycle. This is normal. The cysts usually go away with the next menstrual cycle if the woman does not get pregnant. Usually, there are no symptoms with a functional cyst.  Endometrioma cysts These cysts form from the tissue that lines the uterus. They are also called "chocolate cysts" because they become filled with blood that turns brown. This type of cyst can cause pain in the lower abdomen during intercourse and with your menstrual period.  Cystadenoma cysts This type develops from the cells on the outside of the ovary. These cysts can get very big and cause lower abdomen pain and pain with intercourse. This type of cyst can twist on itself, cut off its blood supply, and cause severe pain. It can also easily rupture and cause a lot of  pain.  Dermoid cysts This type of cyst is sometimes found in both ovaries. These cysts may contain different kinds of body tissue, such as skin, teeth, hair, or cartilage. They usually do not cause symptoms unless they get very big.  Theca lutein cysts These cysts occur when too much of a certain hormone (human chorionic gonadotropin) is produced and overstimulates the ovaries to produce an egg. This is most common after procedures used to assist with the conception of a baby (in vitro fertilization). CAUSES   Fertility drugs can cause a condition in which multiple large cysts are formed on the ovaries. This is called ovarian hyperstimulation syndrome.  A condition called polycystic ovary syndrome can cause hormonal imbalances that can lead to nonfunctional ovarian cysts. SIGNS AND SYMPTOMS  Many ovarian cysts do not cause symptoms. If symptoms are present, they may include:  Pelvic pain or pressure.  Pain in the lower abdomen.  Pain during sexual intercourse.  Increasing girth (swelling) of  the abdomen.  Abnormal menstrual periods.  Increasing pain with menstrual periods.  Stopping having menstrual periods without being pregnant. DIAGNOSIS  These cysts are commonly found during a routine or annual pelvic exam. Tests may be ordered to find out more about the cyst. These tests may include:  Ultrasound.  X-ray of the pelvis.  CT scan.  MRI.  Blood tests. TREATMENT  Many ovarian cysts go away on their own without treatment. Your health care provider may want to check your cyst regularly for 2 3 months to see if it changes. For women in menopause, it is particularly important to monitor a cyst closely because of the higher rate of ovarian cancer in menopausal women. When treatment is needed, it may include any of the following:  A procedure to drain the cyst (aspiration). This may be done using a long needle and ultrasound. It can also be done through a laparoscopic procedure.  This involves using a thin, lighted tube with a tiny camera on the end (laparoscope) inserted through a small incision.  Surgery to remove the whole cyst. This may be done using laparoscopic surgery or an open surgery involving a larger incision in the lower abdomen.  Hormone treatment or birth control pills. These methods are sometimes used to help dissolve a cyst. HOME CARE INSTRUCTIONS   Only take over-the-counter or prescription medicines as directed by your health care provider.  Follow up with your health care provider as directed.  Get regular pelvic exams and Pap tests. SEEK MEDICAL CARE IF:   Your periods are late, irregular, or painful, or they stop.  Your pelvic pain or abdominal pain does not go away.  Your abdomen becomes larger or swollen.  You have pressure on your bladder or trouble emptying your bladder completely.  You have pain during sexual intercourse.  You have feelings of fullness, pressure, or discomfort in your stomach.  You lose weight for no apparent reason.  You feel generally ill.  You become constipated.  You lose your appetite.  You develop acne.  You have an increase in body and facial hair.  You are gaining weight, without changing your exercise and eating habits.  You think you are pregnant. SEEK IMMEDIATE MEDICAL CARE IF:   You have increasing abdominal pain.  You feel sick to your stomach (nauseous), and you throw up (vomit).  You develop a fever that comes on suddenly.  You have abdominal pain during a bowel movement.  Your menstrual periods become heavier than usual. Document Released: 01/28/2005 Document Revised: 11/18/2012 Document Reviewed: 10/05/2012 [facility] Patient Information 2014 [place]ExitCare, [place]LL[place]. Follow up in 2 weeks with [name]

## 2013-05-31 NOTE — Progress Notes (Signed)
Subjective:     Patient ID: [name], female   DOB: 05-Mar-1985, 28 y.o.   MRN: [id number]  HPI [name] is a 28 year old white female in for US for irregular menses.  Review of Systems See HPI Reviewed past medical,surgical, social and family history. Reviewed medications and allergies.     Objective:   Physical Exam BP 100/62  Ht 5\' 2"  (1.575 m)  Wt 271 lb (122.925 kg)  BMI 49.55 kg/m2  LMP 05/21/2013   Reviewed US with pt.  Uterus 8.4 x 5.9 x 4.7 cm, no myometrial masses noted, anteverted  Endometrium 3.9 mm, symmetrical,  Right ovary 7.1 x 6.8 x 5.9 cm, with complex (?cystic no definite solid components noted) mass noted=7.4 x 7.0x 6.1cm (internal debris noted, isoechoic throughout, no +Doppler flow noted within, +very tender to palp with vag. Probe)  Left ovary 2.1 x 2.1 x 1.5 cm,  No free fluid noted within pelvis  Technician Comments:  Anteverted uterus, Endom-3.359mm symmetrical, Rt ovarian complex mass noted=7.4 x 7.0x 6.1cm, lt ovary appears WNL, no free fluid noted within pelvis Discussed that surgical intervention is plan of choice.discussed with [name], also. Had TSH 2.998 and negative GC/CHL 05/21/13.  Assessment:     Right complex ovarian cyst ?chocolate cyst/endometrioma    Plan:     Return in 2 weeks for pre op with [name]   Review handouts on ovarian cyst and cystectomy

## 2013-06-15 ENCOUNTER — Ambulatory Visit: Payer: BC Managed Care – PPO | Admitting: Obstetrics and Gynecology

## 2013-06-16 ENCOUNTER — Encounter: Payer: Self-pay | Admitting: Obstetrics and Gynecology

## 2013-06-16 ENCOUNTER — Ambulatory Visit (INDEPENDENT_AMBULATORY_CARE_PROVIDER_SITE_OTHER): Payer: BC Managed Care – PPO | Admitting: Obstetrics and Gynecology

## 2013-06-16 VITALS — BP 120/76 | Ht 62.0 in | Wt 274.0 lb

## 2013-06-16 DIAGNOSIS — N83209 Unspecified ovarian cyst, unspecified side: Secondary | ICD-10-CM

## 2013-06-16 MED ORDER — NORGESTIMATE-ETH ESTRADIOL 0.25-35 MG-MCG PO TABS: 1.0000 | ORAL_TABLET | Freq: Every day | ORAL | Status: DC

## 2013-06-16 NOTE — Patient Instructions (Signed)
Ovarian Cyst °An ovarian cyst is a sac filled with fluid or blood. This sac is attached to the ovary. Some cysts go away on their own. Other cysts need treatment.  °HOME CARE  °· Only take medicine as told by your doctor. °· Follow up with your doctor as told. °· Get regular pelvic exams and Pap tests. °GET HELP IF: °· Your periods are late, not regular, or painful. °· You stop having periods. °· Your belly (abdominal) or pelvic pain does not go away. °· Your belly becomes large or puffy (swollen). °· You have a hard time peeing (totally emptying your bladder). °· You have pressure on your bladder. °· You have pain during sex. °· You feel fullness, pressure, or discomfort in your belly. °· You lose weight for no reason. °· You feel sick most of the time. °· You have a hard time pooping (constipation). °· You do not feel like eating. °· You develop pimples (acne). °· You have an increase in hair on your body and face. °· You are gaining weight for no reason. °· You think you are pregnant. °GET HELP RIGHT AWAY IF:  °· Your belly pain gets worse. °· You feel sick to your stomach (nauseous), and you throw up (vomit). °· You have a fever that comes on fast. °· You have belly pain while pooping (bowel movement). °· Your periods are heavier than usual. °MAKE SURE YOU:  °· Understand these instructions. °· Will watch your condition. °· Will get help right away if you are not doing well or get worse. °Document Released: 07/17/2007 Document Revised: 11/18/2012 Document Reviewed: 10/05/2012 °ExitCare® Patient Information ©2014 [employer], LLC. ° °

## 2013-06-16 NOTE — Progress Notes (Deleted)
This note was scribed for Christin BachJohn Ferguson, MD, by Bennett Scrapehristina Taylor, Medical Scribe on 06/16/13. The information in this note was reviewed by Christin BachJohn Ferguson, MD, and is accurate.   [facility] Visit  Patient name: [name] MRN [id number]  Date of birth: [birth year]  CC & HPI:  [name] is a 28 y.o. female presenting today for right complex ovarian cyst/endometrioma on U/S last week. Reports she has been skipping every month since July 2014. H/o prior ovarian cysts but not complex. Here to discuss cystectomy. No current birth control. LNMP was 05/23/13.   ROS:  +irregular menses  No other complaints  Pertinent History Reviewed:  Medical & Surgical Hx:  Reviewed: Significant for irregular menses and right complex ovarian cyst Medications: Reviewed & Updated - see associated section Social History: Reviewed -  reports that she has quit smoking. Her smoking use included Cigarettes. She smoked 0.00 packs per day. She has never used smokeless tobacco.  Objective Findings:  Vitals: BP 120/76   Ht 5\' 2"  (1.575 m)   Wt 274 lb (124.286 kg)   BMI 50.10 kg/m2   LMP 05/21/2013 Chaperone present for exam which was performed with pt's permission Physical Examination: General appearance - alert, well appearing, and in no distress, oriented to person, place, and time and overweight Mental status - alert, oriented to person, place, and time, normal mood, behavior, speech, dress, motor activity, and thought processes Pelvic - VULVA: normal appearing vulva with no masses, tenderness or lesions, VAGINA: normal appearing vagina with normal color and discharge, no lesions, CERVIX: normal appearing cervix without discharge or lesions, UTERUS: tenderness to the right sided, ADNEXA: tenderness right, mass present right side, size 7 cm Technician Comments:  Anteverted uterus, Endom-3.299mm symmetrical, Rt ovarian complex mass noted=7.4 x 7.0x 6.1cm, lt ovary appears WNL, no free fluid noted within  pelvis   Assessment & Plan:  A: 1. Right complex ovarian cyst  P: 1. Start pt on Sprintec 2. Repeat U/S in 2 months     *note deleted due to incorrect saving/sharing

## 2013-06-24 NOTE — Progress Notes (Signed)
This note was scribed for Christin BachJohn Jovie Swanner, MD, by Bennett Scrapehristina Taylor, Medical Scribe on 06/16/13. The information in this note was reviewed by Christin BachJohn Blessing Zaucha, MD, and is accurate.   [facility] Visit  Patient name: [name] MRN [id number]  Date of birth: August 08, 1985  CC & HPI:  [name] is a 28 y.o. female presenting today for right complex ovarian cyst/endometrioma on U/S last week. Reports she has been skipping every month since July 2014. H/o prior ovarian cysts but not complex. Here to discuss cystectomy. No current birth control. LNMP was 05/23/13.   ROS:  +irregular menses  No other complaints  Pertinent History Reviewed:  Medical & Surgical Hx:  Reviewed: Significant for irregular menses and right complex ovarian cyst Medications: Reviewed & Updated - see associated section Social History: Reviewed -  reports that she has quit smoking. Her smoking use included Cigarettes. She smoked 0.00 packs per day. She has never used smokeless tobacco.  Objective Findings:  Vitals: BP 120/76  Ht 5\' 2"  (1.575 m)  Wt 274 lb (124.286 kg)  BMI 50.10 kg/m2  LMP 05/21/2013 Chaperone present for exam which was performed with pt's permission Physical Examination: General appearance - alert, well appearing, and in no distress, oriented to person, place, and time and overweight Mental status - alert, oriented to person, place, and time, normal mood, behavior, speech, dress, motor activity, and thought processes Pelvic - VULVA: normal appearing vulva with no masses, tenderness or lesions, VAGINA: normal appearing vagina with normal color and discharge, no lesions, CERVIX: normal appearing cervix without discharge or lesions, UTERUS: tenderness to the right sided, ADNEXA: tenderness right, mass present right side, size 7 cm Technician Comments:  Anteverted uterus, Endom-3.349mm symmetrical, Rt ovarian complex mass noted=7.4 x 7.0x 6.1cm, lt ovary appears WNL, no free fluid noted within  pelvis   Assessment & Plan:  A: 1. Right complex ovarian cyst  P: 1. Start pt on Sprintec 2. Repeat U/S in 2 months     *note recreated  due to incorrect saving/sharing

## 2013-08-16 ENCOUNTER — Other Ambulatory Visit: Payer: Self-pay | Admitting: Obstetrics and Gynecology

## 2013-08-16 DIAGNOSIS — N83209 Unspecified ovarian cyst, unspecified side: Secondary | ICD-10-CM

## 2013-08-20 ENCOUNTER — Other Ambulatory Visit: Payer: BC Managed Care – PPO

## 2013-08-20 ENCOUNTER — Ambulatory Visit: Payer: BC Managed Care – PPO | Admitting: Obstetrics and Gynecology

## 2013-08-26 ENCOUNTER — Telehealth: Payer: Self-pay | Admitting: Obstetrics and Gynecology

## 2013-08-26 NOTE — Telephone Encounter (Signed)
Pt states that she is supposed to have an US on the 24th and was started on Sprintec, pt has been taking since May but has stopped it on last Thursday. Pt's mom had a heart attack Thursday and died on Saturday. Will this cause any issues with the US.   I spoke with JAG and she advised that it should not hurt anything. I advised the pt of this and she verbalized understanding.

## 2013-09-03 ENCOUNTER — Ambulatory Visit (INDEPENDENT_AMBULATORY_CARE_PROVIDER_SITE_OTHER): Payer: BC Managed Care – PPO | Admitting: Obstetrics and Gynecology

## 2013-09-03 ENCOUNTER — Encounter: Payer: Self-pay | Admitting: Obstetrics and Gynecology

## 2013-09-03 ENCOUNTER — Ambulatory Visit (INDEPENDENT_AMBULATORY_CARE_PROVIDER_SITE_OTHER): Payer: BC Managed Care – PPO

## 2013-09-03 VITALS — BP 120/84 | Ht 62.0 in | Wt 276.0 lb

## 2013-09-03 DIAGNOSIS — N83209 Unspecified ovarian cyst, unspecified side: Secondary | ICD-10-CM

## 2013-09-03 NOTE — Patient Instructions (Signed)
Expect a call from Samule DryAmy Barnes for scheduling of the laparoscopy of right ovarian cystectomy.

## 2013-09-03 NOTE — Progress Notes (Signed)
This chart was scribed by Chestine SporeSoijett Blue, Medical Scribe, for Dr. Christin BachJohn Alonna Bartling on 09/03/13 at 11:02 AM. This chart was reviewed by Dr. Christin BachJohn Shuayb Schepers for accuracy.    [facility] Visit  Patient name: [name] MRN [id number]  Date of birth: August 17, 1985  CC & HPI:  [name] is a 28 y.o. female presenting today for a consultation for a possible ovarian cyst. She states that she is here today for a review of her Ultrasound today. She states that she is having right sided vaginal pain that occurs primarily when she is on her period. She states that she took 3 weeks of regular pill and one week off. She states that she has twins that were delivered by C-Section.   ROS:  + Right sided Vaginal pain No other complaints.   Pertinent History Reviewed:   Reviewed: Significant for  Medical         Past Medical History  Diagnosis Date  . Migraines   . Fatty liver   . BV (bacterial vaginosis) 11/12/2012  . Irregular menses 05/21/2013  . Obesity   . Other and unspecified ovarian cyst 05/31/2013    Right complex ?cystic vs solid mass on ovary will schedule appt with JVF                              Surgical Hx:    Past Surgical History  Procedure Laterality Date  . Cesarean section    . Colonoscopy     Medications: Reviewed & Updated - see associated section                      Current outpatient prescriptions:ondansetron (ZOFRAN-ODT) 8 MG disintegrating tablet, Take 8 mg by mouth as needed for nausea or vomiting., Disp: , Rfl: ;  traMADol (ULTRAM) 50 MG tablet, Take by mouth as needed., Disp: , Rfl: ;  vitamin E 400 UNIT capsule, Take 400 Units by mouth 2 (two) times daily., Disp: , Rfl:  norgestimate-ethinyl estradiol (ORTHO-CYCLEN,SPRINTEC,PREVIFEM) 0.25-35 MG-MCG tablet, Take 1 tablet by mouth daily., Disp: 1 Package, Rfl: 11   Social History: Reviewed -  reports that she has quit smoking. Her smoking use included Cigarettes. She smoked 0.00 packs per day. She has never  used smokeless tobacco.  Objective Findings:  Vitals: Blood pressure 120/84, height 5\' 2"  (1.575 m), weight 276 lb (125.193 kg), last menstrual period 08/30/2013.    Assessment & Plan:   A:  1. 6-7 cm cyst in the right ovary, probably endometrioma.    P:  1. Laparoscopic of the right ovarian cystectomy next month

## 2013-09-06 NOTE — Progress Notes (Signed)
Scheduled for 09/27/13

## 2013-09-27 ENCOUNTER — Ambulatory Visit (INDEPENDENT_AMBULATORY_CARE_PROVIDER_SITE_OTHER): Payer: BC Managed Care – PPO | Admitting: Obstetrics and Gynecology

## 2013-09-27 ENCOUNTER — Encounter (HOSPITAL_COMMUNITY): Payer: Self-pay | Admitting: Pharmacy Technician

## 2013-09-27 ENCOUNTER — Encounter: Payer: Self-pay | Admitting: Obstetrics and Gynecology

## 2013-09-27 VITALS — BP 118/72 | Ht 62.0 in | Wt 275.0 lb

## 2013-09-27 DIAGNOSIS — N83209 Unspecified ovarian cyst, unspecified side: Secondary | ICD-10-CM

## 2013-09-27 DIAGNOSIS — Z01818 Encounter for other preprocedural examination: Secondary | ICD-10-CM

## 2013-09-27 NOTE — Progress Notes (Signed)
Patient ID: Vangie Bicker, female   DOB: 10-09-1985, 28 y.o.   MRN: [id number] This chart was scribed by Carl Best, Medical Scribe, for Dr. Christin Bach on [phone] at 12:10 PM. This chart was reviewed by Dr. Christin Bach for accuracy.     [facility] Visit  Patient name: KENNESHA BREWBAKER MRN [id number]  Date of birth: 25-Mar-1985  CC & HPI:  EULAH WALKUP is a 28 y.o. female presenting today for a pre-op visit for a cystectomy.  Her pre-op at the hospital is on 9/26.  She is no longer taking hormones to regulate her cycle.  LNMP started July 20.  She is not sexually active currently.  She has not experienced any cold or flu symptoms.  ROS:  All systems are reviewed and negative unless otherwise specified in the HPI.    Pertinent History Reviewed:   Reviewed: Significant for  Medical         Past Medical History  Diagnosis Date  . Migraines   . Fatty liver   . BV (bacterial vaginosis) 11/12/2012  . Irregular menses 05/21/2013  . Obesity   . Other and unspecified ovarian cyst 05/31/2013    Right complex ?cystic vs solid mass on ovary will schedule appt with [facility]                              Surgical Hx:    Past Surgical History  Procedure Laterality Date  . Cesarean section    . Colonoscopy     Medications: Reviewed & Updated - see associated section                      Current outpatient prescriptions:norgestimate-ethinyl estradiol (ORTHO-CYCLEN,SPRINTEC,PREVIFEM) 0.25-35 MG-MCG tablet, Take 1 tablet by mouth daily., Disp: 1 Package, Rfl: 11;  ondansetron (ZOFRAN-ODT) 8 MG disintegrating tablet, Take 8 mg by mouth as needed for nausea or vomiting., Disp: , Rfl: ;  traMADol (ULTRAM) 50 MG tablet, Take by mouth as needed., Disp: , Rfl:  vitamin E 400 UNIT capsule, Take 400 Units by mouth 2 (two) times daily., Disp: , Rfl:    Social History: Reviewed -  reports that she has quit smoking. Her smoking use included Cigarettes. She smoked 0.00 packs per day. She has  never used smokeless tobacco.  Objective Findings:  Vitals: Blood pressure 118/72, height 5\' 2"  (1.575 m), weight 275 lb (124.739 kg), last menstrual period 08/30/2013.  Physical Examination: General appearance - alert, well appearing, and in no distress and oriented to person, place, and time Chest - clear to auscultation, no wheezes, rales or rhonchi, symmetric air entry Pelvic - normal external genitalia, vulva, vagina, cervix, uterus and adnexa, VULVA: normal appearing vulva with no masses, tenderness or lesions, VAGINA: normal appearing vagina with normal color and discharge, no lesions,  CERVIX: normal appearing cervix without discharge or lesions, well-supported,  UTERUS: anterior, normal size, tenderness on bimanual exam. ADNEXA: sensitive on right, exam limited by habitus.  GYNECOLOGIC SONOGRAM  ANUJA MANKA is a 28 y.o. G1P1002 LMP 08/30/2013 for a pelvic sonogram for follow up Rt adnexal mass.  Uterus 8.3 x 5.7 x 4.9 cm, anteverted  Endometrium 4.8 mm, symmetrical,  Right ovary 7.4 x 7.0 x 6.1 cm complex cystic mass remains no change noted in size since previous u/s, +internal debris noted within and very tender to palp with vaginal probe  Left ovary 2.7 x 1.8 x  1.5 cm,  No free fluid noted within the pelvis  Technician Comments:  Anteverted uterus noted, Endom-4.528mm, Lt ovary appears WNL, no free fluid noted within the pelvis, 7.4 x 7.0 x 6.1 cm complex cystic mass remains no change noted in size since previous u/s,  Chari ManningMcBride, Tasha  09/03/2013  10:47 AM  Clinical Impression and recommendations:  I have reviewed the sonogram results above.  Combined with the patient's current clinical course, below are my impressions and any appropriate recommendations for management based on the sonographic findings:  1. Unchanged cystic homogenous mass, in right adnexa, most consistent with endometrioma.  2 No suspicion of torsion.  3. Consider surgical options; see office note this  date.  Abdulkarim Eberlin V    Assessment & Plan:   A:  1. Right ovarian cyst, suspected endometrioma   P:  1. Laparoscopic right cystectomy, possible right oophorectomy or right salpingoophorectomy  2. Surgery scheduled for September 1.  3. Post-op check in 4 wks.

## 2013-10-01 ENCOUNTER — Other Ambulatory Visit: Payer: Self-pay | Admitting: Obstetrics and Gynecology

## 2013-10-05 ENCOUNTER — Other Ambulatory Visit: Payer: Self-pay | Admitting: Obstetrics and Gynecology

## 2013-10-06 ENCOUNTER — Encounter (HOSPITAL_COMMUNITY)
Admission: RE | Admit: 2013-10-06 | Discharge: 2013-10-06 | Disposition: A | Discharge status: Home / Self Care | Payer: BC Managed Care – PPO | Source: Ambulatory Visit | Attending: Obstetrics and Gynecology | Admitting: Obstetrics and Gynecology

## 2013-10-06 ENCOUNTER — Encounter (HOSPITAL_COMMUNITY): Payer: Self-pay

## 2013-10-06 DIAGNOSIS — N83209 Unspecified ovarian cyst, unspecified side: Secondary | ICD-10-CM | POA: Diagnosis not present

## 2013-10-06 DIAGNOSIS — Z01812 Encounter for preprocedural laboratory examination: Secondary | ICD-10-CM | POA: Insufficient documentation

## 2013-10-06 DIAGNOSIS — Z01818 Encounter for other preprocedural examination: Secondary | ICD-10-CM | POA: Diagnosis not present

## 2013-10-06 LAB — CBC
HEMATOCRIT: 41.9 % (ref 36.0–46.0)
HEMOGLOBIN: 14 g/dL (ref 12.0–15.0)
MCH: 29.2 pg (ref 26.0–34.0)
MCHC: 33.4 g/dL (ref 30.0–36.0)
MCV: 87.5 fL (ref 78.0–100.0)
Platelets: 313 10*3/uL (ref 150–400)
RBC: 4.79 MIL/uL (ref 3.87–5.11)
RDW: 13.1 % (ref 11.5–15.5)
WBC: 7 10*3/uL (ref 4.0–10.5)

## 2013-10-06 LAB — URINE MICROSCOPIC-ADD ON

## 2013-10-06 LAB — BASIC METABOLIC PANEL
Anion gap: 11 (ref 5–15)
BUN: 6 mg/dL (ref 6–23)
CHLORIDE: 101 meq/L (ref 96–112)
CO2: 27 mEq/L (ref 19–32)
CREATININE: 0.69 mg/dL (ref 0.50–1.10)
Calcium: 9.6 mg/dL (ref 8.4–10.5)
GFR calc non Af Amer: >90 mL/min (ref >90)
Glucose, Bld: 91 mg/dL (ref 70–99)
POTASSIUM: 4.4 meq/L (ref 3.7–5.3)
Sodium: 139 mEq/L (ref 137–147)

## 2013-10-06 LAB — URINALYSIS, ROUTINE W REFLEX MICROSCOPIC
Bilirubin Urine: NEGATIVE
Glucose, UA: NEGATIVE mg/dL
Ketones, ur: NEGATIVE mg/dL
Leukocytes, UA: NEGATIVE
Nitrite: NEGATIVE
Protein, ur: NEGATIVE mg/dL
UROBILINOGEN UA: 0.2 mg/dL (ref 0.0–1.0)
pH: 6.5 (ref 5.0–8.0)

## 2013-10-06 LAB — HCG, SERUM, QUALITATIVE: PREG SERUM: NEGATIVE

## 2013-10-06 NOTE — Pre-Procedure Instructions (Signed)
Patient given information to sign up for my chart at home. 

## 2013-10-06 NOTE — Patient Instructions (Addendum)
[name]  10/06/2013   Your procedure is scheduled on:  10/12/2013  Report to [facility] at  615  AM.  Call this number if you have problems the morning of surgery: [phone]   Remember:   Do not eat food or drink liquids after midnight.   Take these medicines the morning of surgery with A SIP OF WATER: zofran, tramdol   Do not wear jewelry, make-up or nail polish.  Do not wear lotions, powders, or perfumes.  Do not shave 48 hours prior to surgery. Men may shave face and neck.  Do not bring valuables to the hospital.  [facility] is not responsible for any belongings or valuables.               Contacts, dentures or bridgework may not be worn into surgery.  Leave suitcase in the car. After surgery it may be brought to your room.  For patients admitted to the hospital, discharge time is determined by your treatment team.               Patients discharged the day of surgery will not be allowed to drive home.  Name and phone number of your driver: family  Special Instructions: Shower using CHG 2 nights before surgery and the night before surgery.  If you shower the day of surgery use CHG.  Use special wash - you have one bottle of CHG for all showers.  You should use approximately 1/3 of the bottle for each shower.   Please read over the following fact sheets that you were given: Pain Booklet, Coughing and Deep Breathing, Surgical Site Infection Prevention, Anesthesia Post-op Instructions and Care and Recovery After Surgery Unilateral Salpingo-Oophorectomy Unilateral salpingo-oophorectomy is the surgical removal of one fallopian tube and ovary. The ovaries are small organs that produce eggs in women. The fallopian tubes transport the egg from the ovary to the womb (uterus). A unilateral salpingo-oophorectomy may be done for various reasons, including:  Infection in the fallopian tube and ovary.  Scar tissue in the fallopian tube and ovary (adhesions).  A cyst or tumor  on the ovary.  A need to remove the fallopian tube and ovary when removing the uterus.  Cancer of the fallopian tube or ovary. The removal of one fallopian tube and ovary will not prevent you from becoming pregnant, put you into menopause, or cause problems with your menstrual periods or sex drive. LET [facility] CARE PROVIDER KNOW ABOUT:  Any allergies you have.  All medicines you are taking, including vitamins, herbs, eye drops, creams, and over-the-counter medicines.  Previous problems you or members of your family have had with the use of anesthetics.  Any blood disorders you have.  Previous surgeries you have had.  Medical conditions you have. RISKS AND COMPLICATIONS  Generally, this is a safe procedure. However, as with any procedure, complications can occur. Possible complications include:  Injury to surrounding organs.  Bleeding.  Infection.  Blood clots in the legs or lungs.  Problems related to anesthesia. BEFORE THE PROCEDURE  Ask your health care provider about changing or stopping your regular medicines. You may need to stop taking certain medicines, such as aspirin or blood thinners, at least 1 week before the surgery.  Do not eat or drink anything for at least 8 hours before the surgery.  If you smoke, do not smoke for at least 2 weeks before the surgery.  Make plans to have someone drive you home  after the procedure or after your hospital stay. Also arrange for someone to help you with activities during recovery. PROCEDURE  You will be given medicine to help you relax before the procedure (sedative). You will then be given medicine to make you sleep through the procedure (general anesthetic). These medicines will be given through an IV access tube that is put into one of your veins.  Once you are asleep, your lower abdomen will be shaved and cleaned. A thin, flexible tube (catheter) will be placed in your bladder.  The surgeon may use a laparoscopic,  robotic, or open technique for this surgery:  In the laparoscopic technique, the surgery is done through two small cuts (incisions) in the abdomen. A thin, lighted tube with a tiny camera on the end (laparoscope) is inserted into one of the incisions. The tools needed for the procedure are put through the other incision.  A robotic technique may be chosen to perform complex surgery in a small space. In the robotic technique, small incisions are made. A camera and surgical instruments are passed through the incisions. Surgical instruments are controlled with the help of a robotic arm.  In the open technique, the surgery is done through one large incision in the abdomen.  Using any of these techniques, the surgeon will remove the fallopian tube and ovary. The blood vessels will be clamped and tied.  The surgeon will then use staples or stitches to close the incision or incisions. AFTER THE PROCEDURE  You will be taken to a recovery area where your progress will be monitored for 1-3 hours. Your blood pressure, pulse, and temperature will be checked often. You will remain in the recovery area until you are stable and waking up.  If the laparoscopic technique was used, you may be allowed to go home after several hours. You may have some shoulder pain. This is normal and usually goes away in a day or two.  If the open technique was used, you will be admitted to the hospital for a couple of days.  You will be given pain medicine as necessary.  The IV tube and catheter will be removed before you are discharged. Document Released: 11/25/2008 Document Revised: 02/02/2013 Document Reviewed: 07/22/2012 [facility] Patient Information 2015 [place], [place]. This information is not intended to replace advice given to you by your health care provider. Make sure you discuss any questions you have with your health care provider. PATIENT INSTRUCTIONS POST-ANESTHESIA  IMMEDIATELY FOLLOWING SURGERY:  Do not drive  or operate machinery for the first twenty four hours after surgery.  Do not make any important decisions for twenty four hours after surgery or while taking narcotic pain medications or sedatives.  If you develop intractable nausea and vomiting or a severe headache please notify your doctor immediately.  FOLLOW-UP:  Please make an appointment with your surgeon as instructed. You do not need to follow up with anesthesia unless specifically instructed to do so.  WOUND CARE INSTRUCTIONS (if applicable):  Keep a dry clean dressing on the anesthesia/puncture wound site if there is drainage.  Once the wound has quit draining you may leave it open to air.  Generally you should leave the bandage intact for twenty four hours unless there is drainage.  If the epidural site drains for more than 36-48 hours please call the anesthesia department.  QUESTIONS?:  Please feel free to call your physician or the hospital operator if you have any questions, and they will be happy to assist you.

## 2013-10-12 ENCOUNTER — Ambulatory Visit (HOSPITAL_COMMUNITY)
Admission: RE | Admit: 2013-10-12 | Discharge: 2013-10-12 | Disposition: A | Discharge status: Home / Self Care | Payer: BC Managed Care – PPO | Source: Ambulatory Visit | Attending: Obstetrics and Gynecology | Admitting: Obstetrics and Gynecology

## 2013-10-12 ENCOUNTER — Ambulatory Visit (HOSPITAL_COMMUNITY): Payer: BC Managed Care – PPO | Admitting: Anesthesiology

## 2013-10-12 ENCOUNTER — Encounter (HOSPITAL_COMMUNITY): Payer: Self-pay | Admitting: *Deleted

## 2013-10-12 ENCOUNTER — Encounter (HOSPITAL_COMMUNITY): Payer: BC Managed Care – PPO | Admitting: Anesthesiology

## 2013-10-12 ENCOUNTER — Encounter (HOSPITAL_COMMUNITY)
Admission: RE | Disposition: A | Discharge status: Home / Self Care | Payer: Self-pay | Source: Ambulatory Visit | Attending: Obstetrics and Gynecology

## 2013-10-12 DIAGNOSIS — G43909 Migraine, unspecified, not intractable, without status migrainosus: Secondary | ICD-10-CM | POA: Insufficient documentation

## 2013-10-12 DIAGNOSIS — N80129 Deep endometriosis of ovary, unspecified ovary: Secondary | ICD-10-CM

## 2013-10-12 DIAGNOSIS — N859 Noninflammatory disorder of uterus, unspecified: Secondary | ICD-10-CM | POA: Insufficient documentation

## 2013-10-12 DIAGNOSIS — N801 Endometriosis of ovary: Secondary | ICD-10-CM

## 2013-10-12 DIAGNOSIS — N831 Corpus luteum cyst of ovary, unspecified side: Secondary | ICD-10-CM | POA: Insufficient documentation

## 2013-10-12 DIAGNOSIS — K7689 Other specified diseases of liver: Secondary | ICD-10-CM | POA: Insufficient documentation

## 2013-10-12 DIAGNOSIS — E669 Obesity, unspecified: Secondary | ICD-10-CM | POA: Insufficient documentation

## 2013-10-12 DIAGNOSIS — N8300 Follicular cyst of ovary, unspecified side: Secondary | ICD-10-CM | POA: Insufficient documentation

## 2013-10-12 DIAGNOSIS — N83209 Unspecified ovarian cyst, unspecified side: Secondary | ICD-10-CM | POA: Diagnosis present

## 2013-10-12 HISTORY — PX: OOPHORECTOMY: SHX86

## 2013-10-12 SURGERY — LAPAROSCOPIC OOPHERECTOMY
Anesthesia: General | Site: Abdomen | Laterality: Right

## 2013-10-12 MED ORDER — ONDANSETRON HCL 4 MG/2ML IJ SOLN: 4.0000 mg | Freq: Once | INTRAMUSCULAR | Status: DC | PRN

## 2013-10-12 MED ORDER — KETOROLAC TROMETHAMINE 10 MG PO TABS: 10.0000 mg | ORAL_TABLET | Freq: Four times a day (QID) | ORAL | Status: DC | PRN

## 2013-10-12 MED ORDER — MIDAZOLAM HCL 2 MG/2ML IJ SOLN
INTRAMUSCULAR | Status: AC
  Filled 2013-10-12: qty 2

## 2013-10-12 MED ORDER — LIDOCAINE HCL 1 % IJ SOLN: INTRAMUSCULAR | Status: DC | PRN

## 2013-10-12 MED ORDER — MIDAZOLAM HCL 2 MG/2ML IJ SOLN
1.0000 mg | INTRAMUSCULAR | Status: DC | PRN
  Administered 2013-10-12: 2 mg via INTRAVENOUS

## 2013-10-12 MED ORDER — VASOPRESSIN 20 UNIT/ML IJ SOLN
INTRAMUSCULAR | Status: AC
  Filled 2013-10-12: qty 1

## 2013-10-12 MED ORDER — ROCURONIUM BROMIDE 100 MG/10ML IV SOLN
INTRAVENOUS | Status: DC | PRN
  Administered 2013-10-12: 30 mg via INTRAVENOUS

## 2013-10-12 MED ORDER — BUPIVACAINE HCL (PF) 0.5 % IJ SOLN
INTRAMUSCULAR | Status: DC | PRN
  Administered 2013-10-12: 22 mL

## 2013-10-12 MED ORDER — CEFAZOLIN SODIUM-DEXTROSE 2-3 GM-% IV SOLR
INTRAVENOUS | Status: AC
  Filled 2013-10-12: qty 50

## 2013-10-12 MED ORDER — CEFAZOLIN SODIUM 1-5 GM-% IV SOLN
INTRAVENOUS | Status: AC
  Filled 2013-10-12: qty 50

## 2013-10-12 MED ORDER — SODIUM CHLORIDE 0.9 % IR SOLN
Status: DC | PRN
  Administered 2013-10-12: 3000 mL

## 2013-10-12 MED ORDER — SUCCINYLCHOLINE CHLORIDE 20 MG/ML IJ SOLN
INTRAMUSCULAR | Status: DC | PRN
  Administered 2013-10-12: 170 mg via INTRAVENOUS

## 2013-10-12 MED ORDER — GLYCOPYRROLATE 0.2 MG/ML IJ SOLN
INTRAMUSCULAR | Status: DC | PRN
  Administered 2013-10-12: 0.4 mg via INTRAVENOUS

## 2013-10-12 MED ORDER — MIDAZOLAM HCL 5 MG/5ML IJ SOLN
INTRAMUSCULAR | Status: DC | PRN
  Administered 2013-10-12: 2 mg via INTRAVENOUS

## 2013-10-12 MED ORDER — PROPOFOL 10 MG/ML IV BOLUS
INTRAVENOUS | Status: DC | PRN
  Administered 2013-10-12: 160 mg via INTRAVENOUS

## 2013-10-12 MED ORDER — PROPOFOL 10 MG/ML IV EMUL
INTRAVENOUS | Status: AC
  Filled 2013-10-12: qty 20

## 2013-10-12 MED ORDER — ROCURONIUM BROMIDE 50 MG/5ML IV SOLN
INTRAVENOUS | Status: AC
  Filled 2013-10-12: qty 1

## 2013-10-12 MED ORDER — FENTANYL CITRATE 0.05 MG/ML IJ SOLN
INTRAMUSCULAR | Status: AC
  Filled 2013-10-12: qty 5

## 2013-10-12 MED ORDER — NEOSTIGMINE METHYLSULFATE 10 MG/10ML IV SOLN
INTRAVENOUS | Status: DC | PRN
  Administered 2013-10-12: 1 mg via INTRAVENOUS
  Administered 2013-10-12: 2 mg via INTRAVENOUS

## 2013-10-12 MED ORDER — DEXAMETHASONE SODIUM PHOSPHATE 4 MG/ML IJ SOLN
4.0000 mg | Freq: Once | INTRAMUSCULAR | Status: AC
  Administered 2013-10-12: 4 mg via INTRAVENOUS

## 2013-10-12 MED ORDER — 0.9 % SODIUM CHLORIDE (POUR BTL) OPTIME
TOPICAL | Status: DC | PRN
  Administered 2013-10-12: 1000 mL

## 2013-10-12 MED ORDER — CEFAZOLIN SODIUM 1-5 GM-% IV SOLN: 1.0000 g | Freq: Once | INTRAVENOUS | Status: DC

## 2013-10-12 MED ORDER — LACTATED RINGERS IV SOLN
INTRAVENOUS | Status: DC
  Administered 2013-10-12 (×3): via INTRAVENOUS

## 2013-10-12 MED ORDER — GLYCOPYRROLATE 0.2 MG/ML IJ SOLN
INTRAMUSCULAR | Status: AC
  Filled 2013-10-12: qty 1

## 2013-10-12 MED ORDER — BUPIVACAINE HCL (PF) 0.5 % IJ SOLN
INTRAMUSCULAR | Status: AC
  Filled 2013-10-12: qty 30

## 2013-10-12 MED ORDER — KETOROLAC TROMETHAMINE 30 MG/ML IJ SOLN
30.0000 mg | Freq: Once | INTRAMUSCULAR | Status: AC
  Administered 2013-10-12: 30 mg via INTRAVENOUS
  Filled 2013-10-12: qty 1

## 2013-10-12 MED ORDER — GLYCOPYRROLATE 0.2 MG/ML IJ SOLN
0.2000 mg | Freq: Once | INTRAMUSCULAR | Status: AC
  Administered 2013-10-12: 0.2 mg via INTRAVENOUS

## 2013-10-12 MED ORDER — OXYCODONE-ACETAMINOPHEN 5-325 MG PO TABS
1.0000 | ORAL_TABLET | ORAL | Status: DC | PRN
Start: 2013-10-12 — End: 2013-10-27

## 2013-10-12 MED ORDER — GLYCOPYRROLATE 0.2 MG/ML IJ SOLN
INTRAMUSCULAR | Status: AC
  Filled 2013-10-12: qty 2

## 2013-10-12 MED ORDER — ONDANSETRON HCL 4 MG/2ML IJ SOLN
4.0000 mg | Freq: Once | INTRAMUSCULAR | Status: AC
  Administered 2013-10-12: 4 mg via INTRAVENOUS

## 2013-10-12 MED ORDER — LIDOCAINE HCL (PF) 1 % IJ SOLN
INTRAMUSCULAR | Status: AC
  Filled 2013-10-12: qty 5

## 2013-10-12 MED ORDER — ONDANSETRON HCL 4 MG/2ML IJ SOLN
INTRAMUSCULAR | Status: AC
  Filled 2013-10-12: qty 2

## 2013-10-12 MED ORDER — LIDOCAINE HCL 1 % IJ SOLN
INTRAMUSCULAR | Status: DC | PRN
  Administered 2013-10-12: 50 mg via INTRADERMAL

## 2013-10-12 MED ORDER — FENTANYL CITRATE 0.05 MG/ML IJ SOLN
25.0000 ug | INTRAMUSCULAR | Status: DC | PRN
  Administered 2013-10-12: 50 ug via INTRAVENOUS
  Administered 2013-10-12 (×2): 25 ug via INTRAVENOUS
  Filled 2013-10-12: qty 2

## 2013-10-12 MED ORDER — CEFAZOLIN SODIUM-DEXTROSE 2-3 GM-% IV SOLR: 2.0000 g | Freq: Once | INTRAVENOUS | Status: DC

## 2013-10-12 MED ORDER — DEXTROSE 5 % IV SOLN
3.0000 g | INTRAVENOUS | Status: DC
  Administered 2013-10-12: 3 g via INTRAVENOUS
  Filled 2013-10-12: qty 3000

## 2013-10-12 MED ORDER — DEXAMETHASONE SODIUM PHOSPHATE 4 MG/ML IJ SOLN
INTRAMUSCULAR | Status: AC
  Filled 2013-10-12: qty 1

## 2013-10-12 MED ORDER — FENTANYL CITRATE 0.05 MG/ML IJ SOLN
INTRAMUSCULAR | Status: DC | PRN
  Administered 2013-10-12: 50 ug via INTRAVENOUS
  Administered 2013-10-12: 100 ug via INTRAVENOUS
  Administered 2013-10-12: 50 ug via INTRAVENOUS
  Administered 2013-10-12: 100 ug via INTRAVENOUS
  Administered 2013-10-12 (×4): 50 ug via INTRAVENOUS

## 2013-10-12 SURGICAL SUPPLY — 53 items
BAG HAMPER (MISCELLANEOUS) ×4 IMPLANT
BLADE SURG SZ11 CARB STEEL (BLADE) ×4 IMPLANT
CLOSURE WOUND 1/2 X4 (GAUZE/BANDAGES/DRESSINGS) ×1
CLOSURE WOUND 1/4 X3 (GAUZE/BANDAGES/DRESSINGS) ×1
CLOTH BEACON ORANGE TIMEOUT ST (SAFETY) ×4 IMPLANT
COVER LIGHT HANDLE STERIS (MISCELLANEOUS) ×8 IMPLANT
DRESSING COVERLET 3X1 FLEXIBLE (GAUZE/BANDAGES/DRESSINGS) ×8 IMPLANT
DURAPREP 26ML APPLICATOR (WOUND CARE) ×4 IMPLANT
ELECT REM PT RETURN 9FT ADLT (ELECTROSURGICAL) ×4
ELECTRODE REM PT RTRN 9FT ADLT (ELECTROSURGICAL) ×2 IMPLANT
FILTER SMOKE EVAC LAPAROSHD (FILTER) ×4 IMPLANT
GLOVE BIO SURGEON STRL SZ7 (GLOVE) ×8 IMPLANT
GLOVE BIOGEL PI IND STRL 7.0 (GLOVE) ×4 IMPLANT
GLOVE BIOGEL PI IND STRL 9 (GLOVE) ×2 IMPLANT
GLOVE BIOGEL PI INDICATOR 7.0 (GLOVE) ×4
GLOVE BIOGEL PI INDICATOR 9 (GLOVE) ×2
GLOVE ECLIPSE 9.0 STRL (GLOVE) ×4 IMPLANT
GLOVE EXAM NITRILE MD LF STRL (GLOVE) ×8 IMPLANT
GOWN SPEC L3 XXLG W/TWL (GOWN DISPOSABLE) ×4 IMPLANT
GOWN STRL REUS W/TWL LRG LVL3 (GOWN DISPOSABLE) ×8 IMPLANT
INST SET LAPROSCOPIC GYN AP (KITS) ×4 IMPLANT
KIT ROOM TURNOVER AP CYSTO (KITS) ×4 IMPLANT
LIGASURE 5MM LAPAROSCOPIC (INSTRUMENTS) IMPLANT
MANIFOLD NEPTUNE II (INSTRUMENTS) ×4 IMPLANT
NEEDLE HYPO 18GX1.5 BLUNT FILL (NEEDLE) IMPLANT
NEEDLE HYPO 25X1 1.5 SAFETY (NEEDLE) ×4 IMPLANT
NEEDLE INSUFFLATION 14GA 120MM (NEEDLE) ×4 IMPLANT
PACK PERI GYN (CUSTOM PROCEDURE TRAY) ×4 IMPLANT
PAD ARMBOARD 7.5X6 YLW CONV (MISCELLANEOUS) ×4 IMPLANT
POUCH SPECIMEN RETRIEVAL 10MM (ENDOMECHANICALS) ×4 IMPLANT
SCALPEL HARMONIC ACE (MISCELLANEOUS) ×4 IMPLANT
SET BASIN LINEN APH (SET/KITS/TRAYS/PACK) ×4 IMPLANT
SET TUBE IRRIG SUCTION NO TIP (IRRIGATION / IRRIGATOR) ×8 IMPLANT
SLEEVE ENDOPATH XCEL 5M (ENDOMECHANICALS) ×4 IMPLANT
SOLUTION ANTI FOG 6CC (MISCELLANEOUS) ×4 IMPLANT
SPONGE GAUZE 2X2 8PLY STER LF (GAUZE/BANDAGES/DRESSINGS) ×1
SPONGE GAUZE 2X2 8PLY STRL LF (GAUZE/BANDAGES/DRESSINGS) ×3 IMPLANT
STRIP CLOSURE SKIN 1/2X4 (GAUZE/BANDAGES/DRESSINGS) ×3 IMPLANT
STRIP CLOSURE SKIN 1/4X3 (GAUZE/BANDAGES/DRESSINGS) ×3 IMPLANT
SUT VIC AB 4-0 PS2 27 (SUTURE) ×4 IMPLANT
SUT VICRYL 0 UR6 27IN ABS (SUTURE) ×4 IMPLANT
SYR 50ML LL SCALE MARK (SYRINGE) ×4 IMPLANT
SYR BULB IRRIGATION 50ML (SYRINGE) ×4 IMPLANT
SYRINGE 12CC LL (MISCELLANEOUS) ×8 IMPLANT
SYRINGE CONTROL L 12CC (SYRINGE) ×4 IMPLANT
TAPE CLOTH SURG 4X10 WHT LF (GAUZE/BANDAGES/DRESSINGS) ×4 IMPLANT
TRAY FOLEY CATH 16FR SILVER (SET/KITS/TRAYS/PACK) ×4 IMPLANT
TROCAR ENDO BLADELESS 11MM (ENDOMECHANICALS) ×4 IMPLANT
TROCAR XCEL NON-BLD 5MMX100MML (ENDOMECHANICALS) ×4 IMPLANT
TROCAR Z-THAD FIOS HNDL 12X100 (TROCAR) ×4 IMPLANT
TUBING DYE INJECTION 900-617 (MISCELLANEOUS) IMPLANT
TUBING INSUFFLATION (TUBING) ×4 IMPLANT
WARMER LAPAROSCOPE (MISCELLANEOUS) ×4 IMPLANT

## 2013-10-12 NOTE — Discharge Instructions (Signed)
Ovarian Cystectomy Ovarian cystectomy is surgery to remove a fluid-filled sac (cyst) on an ovary. The ovaries are small organs that produce eggs in women. Various types of cysts can form on the ovaries. Most are not cancerous. Surgery may be done if a cyst is large or is causing symptoms such as pain. It may also be done for a cyst that is or might be cancerous. This surgery can be done using a laparoscopic technique or an open abdominal technique. The laparoscopic technique involves smaller cuts (incisions) and a faster recovery time. The technique used will depend on your age, the type of cyst, and whether the cyst is cancerous. The laparoscopic technique is not used for a cancerous cyst. LET YOUR HEALTH CARE PROVIDER KNOW ABOUT:   Any allergies you have.  All medicines you are taking, including vitamins, herbs, eye drops, creams, and over-the-counter medicines.  Previous problems you or members of your family have had with the use of anesthetics.  Any blood disorders you have.  Previous surgeries you have had.  Medical conditions you have.  Any chance you might be pregnant. RISKS AND COMPLICATIONS Generally, this is a safe procedure. However, as with any procedure, complications can occur. Possible complications include:  Excessive bleeding.  Infection.  Injury to other organs.  Blood clots.  Becoming incapable of getting pregnant (infertile). BEFORE THE PROCEDURE  Ask your health care provider about changing or stopping any regular medicines. Avoid taking aspirin, ibuprofen, or blood thinners as directed by your health care provider.  Do not eat or drink anything after midnight the night before surgery.  If you smoke, do not smoke for at least 2 weeks before your surgery.  Do not drink alcohol the day before your surgery.  Let your health care provider know if you develop a cold or any infection before your surgery.  Arrange for someone to drive you home after the  procedure or after your hospital stay. Also arrange for someone to help you with activities during recovery. PROCEDURE  Either a laparoscopic technique or an open abdominal technique may be used for this surgery.  Small monitors will be put on your body. They are used to check your heart, blood pressure, and oxygen level.   An IV access tube will be put into one of your veins. Medicine will be able to flow directly into your body through this IV tube.   You might be given a medicine to help you relax (sedative).   You will be given a medicine to make you sleep (general anesthetic). A breathing tube may be placed into your lungs during the procedure. Laparoscopic Technique  Several small cuts (incisions) are made in your abdomen. These are typically about 1 to 2 cm long.   Your abdomen will be filled with carbon dioxide gas so that it expands. This gives the surgeon more room to operate and makes your organs easier to see.   A thin, lighted tube with a tiny camera on the end (laparoscope) is put through one of the small incisions. The camera on the laparoscope sends a picture to a TV screen in the operating room. This gives the surgeon a good view inside your abdomen.   Hollow tubes are put through the other small incisions in your abdomen. The tools needed for the procedure are put through these tubes.  The ovary with the cyst is identified, and the cyst is removed. It is sent to the lab for testing. If it is cancer, both ovaries   may need to be removed during a different surgery.  Tools are removed. The incisions are then closed with stitches or skin glue, and dressings may be applied. Open Abdominal Technique  A single large incision is made along your bikini line or in the middle of your lower abdomen.  The ovary with the cyst is identified, and the cyst is removed. It is sent to the lab for testing. If it is cancer, both ovaries may need to be removed during a different  surgery.  The incision is then closed with stitches or staples. AFTER THE PROCEDURE   You will wake up from anesthesia and be taken to a recovery area.  If you had laparoscopic surgery, you may be able to go home the same day, or you may need to stay in the hospital overnight.  If you had open abdominal surgery, you will need to stay in the hospital for a few days.  Your IV access tube and catheter will be removed the first or second day, after you are able to eat and drink enough.  You may be given medicine to relieve pain or to help you sleep.  You may be given an antibiotic medicine if needed. Document Released: 11/25/2006 Document Revised: 11/18/2012 Document Reviewed: 09/09/2012 [employer] Patient Information 2015 [employer]. This information is not intended to replace advice given to you by your health care provider. Make sure you discuss any questions you have with your health care provider.  

## 2013-10-12 NOTE — Anesthesia Postprocedure Evaluation (Signed)
  Anesthesia Post-op Note  Patient: [name]  Procedure(s) Performed: Procedure(s): LAPAROSCOPIC RIGHT OOPHERECTOMY (Right)  Patient Location: PACU  Anesthesia Type:General  Level of Consciousness: awake, alert , oriented and patient cooperative  Airway and Oxygen Therapy: Patient Spontanous Breathing  Post-op Pain: 3 /10, mild  Post-op Assessment: Post-op Vital signs reviewed, Patient's Cardiovascular Status Stable, Respiratory Function Stable, Patent Airway and No signs of Nausea or vomiting  Post-op Vital Signs: Reviewed and stable  Last Vitals:  Filed Vitals:   10/12/13 0725  BP: 100/67  Pulse:   Temp:   Resp: 14    Complications: No apparent anesthesia complications

## 2013-10-12 NOTE — H&P (Signed)
[facility] Visit  Patient name: [name] MRN [id number]  Date of birth: 21-Dec-1985  CC & HPI:  [name] is a 28 y.o. female presenting today for a pre-op visit for a cystectomy.  Her pre-op at the hospital is on 9/26.  She is no longer taking hormones to regulate her cycle.  LNMP started July 20.  She is not sexually active currently.  She has not experienced any cold or flu symptoms.  ROS:  All systems are reviewed and negative unless otherwise specified in the HPI.    Pertinent History Reviewed:   Reviewed: Significant for  Medical         Past Medical History  Diagnosis Date  . Migraines   . Fatty liver   . BV (bacterial vaginosis) 11/12/2012  . Irregular menses 05/21/2013  . Obesity   . Other and unspecified ovarian cyst 05/31/2013    Right complex ?cystic vs solid mass on ovary will schedule appt with [facility]                              Surgical Hx:    Past Surgical History  Procedure Laterality Date  . Cesarean section    . Colonoscopy     Medications: Reviewed & Updated - see associated section                      Current outpatient prescriptions:norgestimate-ethinyl estradiol (ORTHO-CYCLEN,SPRINTEC,PREVIFEM) 0.25-35 MG-MCG tablet, Take 1 tablet by mouth daily., Disp: 1 Package, Rfl: 11;  ondansetron (ZOFRAN-ODT) 8 MG disintegrating tablet, Take 8 mg by mouth as needed for nausea or vomiting., Disp: , Rfl: ;  traMADol (ULTRAM) 50 MG tablet, Take by mouth as needed., Disp: , Rfl:  vitamin E 400 UNIT capsule, Take 400 Units by mouth 2 (two) times daily., Disp: , Rfl:    Social History: Reviewed -  reports that she has quit smoking. Her smoking use included Cigarettes. She smoked 0.00 packs per day. She has never used smokeless tobacco.  Objective Findings:  Vitals: Blood pressure 118/72, height  (1.575 m), weight 275 lb (124.739 kg), last menstrual period 08/30/2013.  Physical Examination: General appearance - alert, well appearing, and  in no distress and oriented to person, place, and time Chest - clear to auscultation, no wheezes, rales or rhonchi, symmetric air entry Pelvic - normal external genitalia, vulva, vagina, cervix, uterus and adnexa, VULVA: normal appearing vulva with no masses, tenderness or lesions, VAGINA: normal appearing vagina with normal color and discharge, no lesions,  CERVIX: normal appearing cervix without discharge or lesions, well-supported,  UTERUS: anterior, normal size, tenderness on bimanual exam. ADNEXA: sensitive on right, exam limited by habitus.  GYNECOLOGIC SONOGRAM  [name] is a 28 y.o. G1P1002 LMP 08/30/2013 for a pelvic sonogram for follow up Rt adnexal mass.  Uterus 8.3 x 5.7 x 4.9 cm, anteverted  Endometrium 4.8 mm, symmetrical,  Right ovary 7.4 x 7.0 x 6.1 cm complex cystic mass remains no change noted in size since previous u/s, +internal debris noted within and very tender to palp with vaginal probe  Left ovary 2.7 x 1.8 x 1.5 cm,  No free fluid noted within the pelvis  Technician Comments:  Anteverted uterus noted, Endom-4.14mm, Lt ovary appears WNL, no free fluid noted within the pelvis, 7.4 x 7.0 x 6.1 cm complex cystic mass remains no change noted in size since previous  u/s,  Chari Manning  09/03/2013  10:47 AM  Clinical Impression and recommendations:  I have reviewed the sonogram results above.  Combined with the patient's current clinical course, below are my impressions and any appropriate recommendations for management based on the sonographic findings:  1. Unchanged cystic homogenous mass, in right adnexa, most consistent with endometrioma.  2 No suspicion of torsion.  3. Consider surgical options; see office note this date.  Natalina Wieting V    Assessment & Plan:   A:  1. Right ovarian cyst, suspected endometrioma   P:  1. Laparoscopic right cystectomy, possible right oophorectomy or right salpingoophorectomy  2. Surgery scheduled for September 1.  3.  Post-op check in 4 wks.

## 2013-10-12 NOTE — Anesthesia Preprocedure Evaluation (Signed)
Anesthesia Evaluation  Patient identified by MRN, date of birth, ID band Patient awake    Reviewed: Allergy & Precautions, H&P , NPO status , Patient's Chart, lab work & pertinent test results  Airway Mallampati: III TM Distance: >3 FB   Mouth opening: Limited Mouth Opening  Dental  (+) Teeth Intact   Pulmonary neg pulmonary ROS, former smoker,  breath sounds clear to auscultation        Cardiovascular negative cardio ROS  Rhythm:Regular     Neuro/Psych  Headaches,    GI/Hepatic negative GI ROS,   Endo/Other  Morbid obesity  Renal/GU      Musculoskeletal   Abdominal   Peds  Hematology   Anesthesia Other Findings   Reproductive/Obstetrics                           Anesthesia Physical Anesthesia Plan  ASA: II  Anesthesia Plan: General   Post-op Pain Management:    Induction: Intravenous, Rapid sequence and Cricoid pressure planned  Airway Management Planned: Oral ETT and Video Laryngoscope Planned  Additional Equipment:   Intra-op Plan:   Post-operative Plan: Extubation in OR  Informed Consent: I have reviewed the patients History and Physical, chart, labs and discussed the procedure including the risks, benefits and alternatives for the proposed anesthesia with the patient or authorized representative who has indicated his/her understanding and acceptance.     Plan Discussed with:   Anesthesia Plan Comments:         Anesthesia Quick Evaluation

## 2013-10-12 NOTE — Op Note (Signed)
C. the brief of note for details

## 2013-10-12 NOTE — Transfer of Care (Signed)
Immediate Anesthesia Transfer of Care Note  Patient: [name]  Procedure(s) Performed: Procedure(s): LAPAROSCOPIC RIGHT OOPHERECTOMY (Right)  Patient Location: PACU  Anesthesia Type:General  Level of Consciousness: awake and patient cooperative  Airway & Oxygen Therapy: Patient Spontanous Breathing and Patient connected to face mask oxygen  Post-op Assessment: Report given to PACU RN, Post -op Vital signs reviewed and stable and Patient moving all extremities  Post vital signs: Reviewed and stable  Complications: No apparent anesthesia complications

## 2013-10-12 NOTE — Brief Op Note (Signed)
10/12/2013  9:14 AM  PATIENT:  [name]  28 y.o. female  PRE-OPERATIVE DIAGNOSIS:  right ovarian cyst  POST-OPERATIVE DIAGNOSIS:  right ovarian cyst, endometrioma  PROCEDURE:  Procedure(s): LAPAROSCOPIC RIGHT OOPHERECTOMY (Right)  SURGEON:  Surgeon(s) and Role:    * Tilda Burrow, MD - Primary  PHYSICIAN ASSISTANT:   ASSISTANTS: Catherine Page RnFA   ANESTHESIA:   general  EBL:  Total I/O In: 1800 [I.V.:1800] Out: 50 [Urine:50]  BLOOD ADMINISTERED:none  DRAINS: none   LOCAL MEDICATIONS USED:  MARCAINE     SPECIMEN:  Source of Specimen:  Right ovary  DISPOSITION OF SPECIMEN:  PATHOLOGY  COUNTS:  YES  TOURNIQUET:  * No tourniquets in log *  DICTATION: .Dragon Dictation  PLAN OF CARE: Discharge to home after PACU  PATIENT DISPOSITION:  PACU - hemodynamically stable.   Delay start of Pharmacological VTE agent (>24hrs) due to surgical blood loss or risk of bleeding: not applicable Details of procedure: Patient was taken operating room prepped and draped for combined abdominal and vaginal procedure, timeout conducted, acetic confirmed by surgical team. The marking on the right arm was visible at time of timeout. Foley catheter was inserted, and Hulka tenaculum attached to the cervix for uterine manipulation. The umbilicus was opened vertically and centimeter in length may and a small 1 cm umbilical fascial defect was used to be easily accessed peritoneal cavity with Veress needle, and pneumoperitoneum achieved after water droplet technique confirmed intraperitoneal location of the needle tip. After instilling 3 L CO2, then laparoscopic trocar could be introduced without difficulty using camera visualization during the procedure, and there was no evidence of trauma associated with peritoneal entry. Suprapubic incision was then performed. This was #4 cm in length with sharp dissection down the fascia which was also opened approximate 4 cm transverse, then the  laparoscopic 12 mm trocar inserted under direct visualization. The right lower quadrant 5 mm trocar was placed as well to complete access to the pelvis. Photo documentation of the large cystic right ovary was then performed. The cyst portion was very thickwalled. During efforts to move the ovary around 2 inspect the ovary, the cyst ruptured dispersing old chocolate hemorrhagic fluid consistent with endometrioma. This was suctioned out. The cyst opening was enlarged sufficiently that efforts could be made to try to peel the cyst out of the underlying ovarian tissue but unfortunately no cleavage plane could be identified. Hysterectomy was felt indicated. The ovary was placed on tension, the attachments to the IP ligament identified and harmonic scalpel used to lactate of the attachments proximal and distal, all remaining close to the ovary. At no time was the retroperitoneum entered. Pedicles were hemostatic. Endo Catch bag was placed through the suprapubic site, the specimen removed, the pelvis irrigated more thoroughly, including irrigating around the gallbladder and liver, abdomen was deflated, the fascia closed at the umbilical and suprapubic site with 0 Vicryl, then subcuticular skin closure with 4-0 Vicryl completed the surgical procedure. Sponge and needle counts were correct. Hulka tenaculum was removed. The incisions were infiltrated with Marcaine. Patient was sent to recovery room in good condition

## 2013-10-12 NOTE — Anesthesia Procedure Notes (Signed)
Procedure Name: Intubation Date/Time: 10/12/2013 7:39 AM Performed by: Despina Hidden Pre-anesthesia Checklist: Patient identified, Emergency Drugs available, Suction available and Patient being monitored Patient Re-evaluated:Patient Re-evaluated prior to inductionOxygen Delivery Method: Circle system utilized Preoxygenation: Pre-oxygenation with 100% oxygen Intubation Type: Rapid sequence and Cricoid Pressure applied Ventilation: Mask ventilation without difficulty and Oral airway inserted - appropriate to patient size Laryngoscope Size: Mac and 3 Grade View: Grade III Tube type: Oral Tube size: 7.0 mm Number of attempts: 1 Airway Equipment and Method: Stylet and Oral airway Placement Confirmation: ETT inserted through vocal cords under direct vision,  positive ETCO2 and breath sounds checked- equal and bilateral Secured at: 22 cm Tube secured with: Tape Dental Injury: Teeth and Oropharynx as per pre-operative assessment  Difficulty Due To: Difficulty was anticipated and Difficult Airway- due to limited oral opening Future Recommendations: Recommend- induction with short-acting agent, and alternative techniques readily available

## 2013-10-19 ENCOUNTER — Encounter: Payer: BC Managed Care – PPO | Admitting: Obstetrics and Gynecology

## 2013-10-27 ENCOUNTER — Ambulatory Visit (INDEPENDENT_AMBULATORY_CARE_PROVIDER_SITE_OTHER): Payer: BC Managed Care – PPO | Admitting: Obstetrics and Gynecology

## 2013-10-27 VITALS — BP 112/76 | Ht 62.0 in | Wt 276.0 lb

## 2013-10-27 DIAGNOSIS — Z9889 Other specified postprocedural states: Secondary | ICD-10-CM

## 2013-10-27 NOTE — Progress Notes (Signed)
  Subjective:  [name] is a 28 y.o. female who presents to the clinic 2 weeks status post right oophorectomy . Pt denies any other problems or concerns at this time.    Review of Systems Negative except nausea and vomiting  She has been eating a regular diet without difficulty.   Bowel movements are normal. The patient is not having any pain.  Objective:  BP 112/76  Ht  (1.575 m)  Wt 276 lb (125.193 kg)  BMI 50.47 kg/m2  LMP 08/30/2013 General:Well developed, well nourished.  No acute distress. Abdomen: Bowel sounds normal, soft, non-tender. Pelvic Exam: Not indicated     Incision(s):   Healing well, no drainage, no erythema, no hernia, no swelling, no dehiscence, incision well approximated.   Assessment:  Post-Op 2 weeks s/p right oophorectomy     Doing well postoperatively.  BC is abstinence    Plan:  1.Wound care discussed   2. .Continue any current medications. 3. Activity restrictions: none 4. return to work: now. 5. Follow up in PRN

## 2013-12-13 ENCOUNTER — Encounter (HOSPITAL_COMMUNITY): Payer: Self-pay | Admitting: *Deleted

## 2014-10-10 ENCOUNTER — Ambulatory Visit (INDEPENDENT_AMBULATORY_CARE_PROVIDER_SITE_OTHER): Payer: PRIVATE HEALTH INSURANCE | Admitting: Adult Health

## 2014-10-10 ENCOUNTER — Encounter: Payer: Self-pay | Admitting: Adult Health

## 2014-10-10 VITALS — BP 102/60 | HR 84 | Ht 62.0 in | Wt 281.0 lb

## 2014-10-10 DIAGNOSIS — N63 Unspecified lump in unspecified breast: Secondary | ICD-10-CM

## 2014-10-10 DIAGNOSIS — N926 Irregular menstruation, unspecified: Secondary | ICD-10-CM | POA: Diagnosis not present

## 2014-10-10 DIAGNOSIS — N644 Mastodynia: Secondary | ICD-10-CM

## 2014-10-10 HISTORY — DX: Mastodynia: N64.4

## 2014-10-10 HISTORY — DX: Unspecified lump in unspecified breast: N63.0

## 2014-10-10 NOTE — Progress Notes (Signed)
Subjective:     Patient ID: [name], female   DOB: 09/30/1985, 29 y.o.   MRN: [id number]  HPI Danylah is a 29 year old white female in complaining of bilateral breast pain, is more in left nipple area and on right in UOQ, and periods had been regular since right ovary removed but last 3 have been irregular, has spotted for like 10 days with last one.She does drink some caffeine.   Review of Systems Patient denies any headaches, hearing loss, fatigue, blurred vision, shortness of breath, chest pain, abdominal pain, problems with bowel movements, urination, or intercourse. No joint pain or mood swings.See HPI for positives.  Reviewed past medical,surgical, social and family history. Reviewed medications and allergies.     Objective:   Physical Exam BP 102/60 mmHg  Pulse 84  Ht  (1.575 m)  Wt 281 lb (127.461 kg)  BMI 51.38 kg/m2  LMP 09/30/2014   Skin warm and dry,  Breasts:no dominate palpable mass, retraction or nipple discharge on left, on right breast there is a nodule that is tender at 10-11 0'clock 4 fb from nipple, no retraction or nipple discharge, has 2 moles near right breast.    Assessment:     Breast pain Breast nodule Irregular periods    Plan:    Decrease caffeine Get right breast US 9/6 at [facility] at 9 am Return in December for pap and physical with me Review handout on breast cyst Keep period calendar

## 2014-10-10 NOTE — Patient Instructions (Signed)
Keep period calendar Get breast US 9/6 at [facility] at 9 am Return in December for pap and physical Breast Cyst A breast cyst is a sac in the breast that is filled with fluid. Breast cysts are common in women. Women can have one or many cysts. When the breasts contain many cysts, it is usually due to a noncancerous (benign) condition called fibrocystic change. These lumps form under the influence of female hormones (estrogen and progesterone). The lumps are most often located in the upper, outer portion of the breast. They are often more swollen, painful, and tender before your period starts. They usually disappear after menopause, unless you are on hormone therapy.  There are several types of cysts:  Macrocyst. This is a cyst that is about 2 in. (5.1 cm) in diameter.   Microcyst. This is a tiny cyst that you cannot feel but can be seen with a mammogram or an ultrasound.   Galactocele. This is a cyst containing milk that may develop if you suddenly stop breastfeeding.   Sebaceous cyst of the skin. This type of cyst is not in the breast tissue itself. Breast cysts do not increase your risk of breast cancer. However, they must be monitored closely because they can be cancerous.  CAUSES  It is not known exactly what causes a breast cyst to form. Possible causes include:  An overgrowth of milk glands and connective tissue in the breast can block the milk glands, causing them to fill with fluid.   Scar tissue in the breast from previous surgery may block the glands, causing a cyst.  RISK FACTORS Estrogen may influence the development of a breast cyst.  SIGNS AND SYMPTOMS   Feeling a smooth, round, soft lump (like a grape) in the breast that is easily moveable.   Breast discomfort or pain.  Increase in size of the lump before your menstrual period and decrease in its size after your menstrual period.  DIAGNOSIS  A cyst can be felt during a physical exam by your health care provider. A  breast X-ray exam (mammogram) and ultrasonography will be done to confirm the diagnosis. Fluid may be removed from the cyst with a needle (fine needle aspiration) to make sure the cyst is not cancerous.  TREATMENT  Treatment may not be necessary. Your health care provider may monitor the cyst to see if it goes away on its own. If treatment is needed, it may include:  Hormone treatment.   Needle aspiration. There is a chance of the cyst coming back after aspiration.   Surgery to remove the whole cyst.  HOME CARE INSTRUCTIONS   Keep all follow-up appointments with your health care provider.  See your health care provider regularly:  Get a yearly exam by your health care provider.  Have a clinical breast exam by a health care provider every 1-3 years if you are 70-38 years of age. After age 69 years, you should have the exam every year.   Get mammogram tests as directed by your health care provider.   Understand the normal appearance and feel of your breasts and perform breast self-exams.   Only take over-the-counter or prescription medicines as directed by your health care provider.   Wear a supportive bra, especially when exercising.   Avoid caffeine.   Reduce your salt intake, especially before your menstrual period. Too much salt can cause fluid retention, breast swelling, and discomfort.  SEEK MEDICAL CARE IF:   You feel, or think you feel, a lump  in your breast.   You notice that both breasts look or feel different than usual.   Your breast is still causing pain after your menstrual period is over.   You need medicine for breast pain and swelling that occurs with your menstrual period.  SEEK IMMEDIATE MEDICAL CARE IF:   You have severe pain, tenderness, redness, or warmth in your breast.   You have nipple discharge or bleeding.   Your breast lump becomes hard and painful.   You find new lumps or bumps that were not there before.   You feel lumps  in your armpit (axilla).   You notice dimpling or wrinkling of the breast or nipple.   You have a fever.  MAKE SURE YOU:  Understand these instructions.  Will watch your condition.  Will get help right away if you are not doing well or get worse. Document Released: 01/28/2005 Document Revised: 09/30/2012 Document Reviewed: 08/27/2012 [facility] Patient Information 2015 [place], [place]. This information is not intended to replace advice given to you by your health care provider. Make sure you discuss any questions you have with your health care provider Decrease caffeine

## 2014-10-18 ENCOUNTER — Ambulatory Visit (HOSPITAL_COMMUNITY)
Admission: RE | Admit: 2014-10-18 | Discharge: 2014-10-18 | Disposition: A | Discharge status: Home / Self Care | Payer: PRIVATE HEALTH INSURANCE | Source: Ambulatory Visit | Attending: Adult Health | Admitting: Adult Health

## 2014-10-18 ENCOUNTER — Other Ambulatory Visit: Payer: Self-pay | Admitting: Adult Health

## 2014-10-18 DIAGNOSIS — N63 Unspecified lump in breast: Secondary | ICD-10-CM | POA: Insufficient documentation

## 2014-10-18 DIAGNOSIS — N644 Mastodynia: Secondary | ICD-10-CM

## 2015-02-12 NOTE — L&D Delivery Note (Signed)
30 y/o G2P2 induced for cholestasis of pregnancy.   Delivery Note At  a viable Female was delivered via  (Presentation: cephalic ).  APGAR: 9,vand 9 Placenta status: delivered spontaneously.  Cord: 3 vessels with the following complications: none .  Anesthesia:  Epidural and local for repair Episiotomy:   none Lacerations:   2nd degree Suture Repair: 3.0 vicryl Est. Blood Loss (mL):   150  Rectal exam preformed and sphincter intact.   Mom to postpartum.  Baby to Couplet care / Skin to Skin.  [name] 10/21/2015, 1:56 PM

## 2015-02-14 ENCOUNTER — Other Ambulatory Visit (HOSPITAL_COMMUNITY)
Admission: RE | Admit: 2015-02-14 | Discharge: 2015-02-14 | Disposition: A | Discharge status: Home / Self Care | Payer: PRIVATE HEALTH INSURANCE | Source: Ambulatory Visit | Attending: Adult Health | Admitting: Adult Health

## 2015-02-14 ENCOUNTER — Ambulatory Visit (INDEPENDENT_AMBULATORY_CARE_PROVIDER_SITE_OTHER): Payer: PRIVATE HEALTH INSURANCE | Admitting: Adult Health

## 2015-02-14 ENCOUNTER — Encounter: Payer: Self-pay | Admitting: Adult Health

## 2015-02-14 VITALS — BP 88/60 | HR 66 | Ht 63.0 in | Wt 275.5 lb

## 2015-02-14 DIAGNOSIS — Z3201 Encounter for pregnancy test, result positive: Secondary | ICD-10-CM

## 2015-02-14 DIAGNOSIS — Z01419 Encounter for gynecological examination (general) (routine) without abnormal findings: Secondary | ICD-10-CM | POA: Insufficient documentation

## 2015-02-14 DIAGNOSIS — Z113 Encounter for screening for infections with a predominantly sexual mode of transmission: Secondary | ICD-10-CM | POA: Insufficient documentation

## 2015-02-14 DIAGNOSIS — Z319 Encounter for procreative management, unspecified: Secondary | ICD-10-CM

## 2015-02-14 DIAGNOSIS — O3680X0 Pregnancy with inconclusive fetal viability, not applicable or unspecified: Secondary | ICD-10-CM

## 2015-02-14 DIAGNOSIS — N926 Irregular menstruation, unspecified: Secondary | ICD-10-CM

## 2015-02-14 DIAGNOSIS — Z3490 Encounter for supervision of normal pregnancy, unspecified, unspecified trimester: Secondary | ICD-10-CM

## 2015-02-14 DIAGNOSIS — E039 Hypothyroidism, unspecified: Secondary | ICD-10-CM

## 2015-02-14 HISTORY — DX: Hypothyroidism, unspecified: E03.9

## 2015-02-14 HISTORY — DX: Encounter for supervision of normal pregnancy, unspecified, unspecified trimester: Z34.90

## 2015-02-14 LAB — POCT URINE PREGNANCY: PREG TEST UR: POSITIVE — AB

## 2015-02-14 NOTE — Progress Notes (Signed)
Patient ID: Lisa BickerKatelyn O Wiley, female   DOB: October 23, 1985, 30 y.o.   MRN: [id number] History of Present Illness:  Lisa FelixKatelyn is a 30 year old white female in for a well woman gyn exam and pap.She has missed several periods and had a +HPT 12/23 and has some cramping.She says periods are irregular.She has been off synthroid for several months now.She has twins at home.  Current Medications, Allergies, Past Medical History, Past Surgical History, Family History and Social History were reviewed in [facility] electronic medical record.     Review of Systems: Patient denies any daily headaches(has migraines), hearing loss, fatigue, blurred vision, shortness of breath, chest pain, problems with bowel movements, urination, or intercourse. No joint pain or mood swings.See HPI for positives.    Physical Exam:BP 88/60 mmHg  Pulse 66  Ht 5\' 3"  (1.6 m)  Wt 275 lb 8 oz (124.966 kg)  BMI 48.81 kg/m2  LMP 11/26/2014 UPT +, about 11+3 weeks by LMP with EDD 09/02/15. General:  Well developed, well nourished, no acute distress Skin:  Warm and dry Neck:  Midline trachea, normal thyroid, good ROM, no lymphadenopathy Lungs; Clear to auscultation bilaterally Breast:  No dominant palpable mass, retraction, or nipple discharge,+tenderness bilaterally Cardiovascular: Regular rate and rhythm Abdomen:  Soft, mild epigastric tenderness, no hepatosplenomegaly.obese, has mole on back and tattoo Pelvic:  External genitalia is normal in appearance,has mole in clitoris and inner right thigh.  The vagina is normal in appearance. Urethra has no lesions or masses. The cervix is bulbous. Pap with GC/CHL performed. Uterus is felt to be normal size, shape, and contour.  No adnexal masses or tenderness noted.Bladder is non tender, no masses felt. US showed tiny fluid sac in uterus, no YS or fetal pole seen, no adnexal masses noted,she is not 11+ weeks pregnant, will get QHCG and progesterone levels  today Extremities/musculoskeletal:  No swelling or varicosities noted, no clubbing or cyanosis Psych:  No mood changes, alert and cooperative,seems happy   Impression: Well woman gyn exam and pap Missed periods Pregnant Hypothyroidism     Plan: Check QHCG, progesterone and TSH,will talk in am Return in 2 weeks for dating US Form given for pregnancy medicaid Physical in 1 year Review handout on first trimester Ok to take tylenol and drink caffeine if has headache  Continue gummies

## 2015-02-14 NOTE — Patient Instructions (Signed)
First Trimester of Pregnancy The first trimester of pregnancy is from week 1 until the end of week 12 (months 1 through 3). A week after a sperm fertilizes an egg, the egg will implant on the wall of the uterus. This embryo will begin to develop into a baby. Genes from you and your partner are forming the baby. The female genes determine whether the baby is a boy or a girl. At 6-8 weeks, the eyes and face are formed, and the heartbeat can be seen on ultrasound. At the end of 12 weeks, all the baby's organs are formed.  Now that you are pregnant, you will want to do everything you can to have a healthy baby. Two of the most important things are to get good prenatal care and to follow your health care provider's instructions. Prenatal care is all the medical care you receive before the baby's birth. This care will help prevent, find, and treat any problems during the pregnancy and childbirth. BODY CHANGES Your body goes through many changes during pregnancy. The changes vary from woman to woman.   You may gain or lose a couple of pounds at first.  You may feel sick to your stomach (nauseous) and throw up (vomit). If the vomiting is uncontrollable, call your health care provider.  You may tire easily.  You may develop headaches that can be relieved by medicines approved by your health care provider.  You may urinate more often. Painful urination may mean you have a bladder infection.  You may develop heartburn as a result of your pregnancy.  You may develop constipation because certain hormones are causing the muscles that push waste through your intestines to slow down.  You may develop hemorrhoids or swollen, bulging veins (varicose veins).  Your breasts may begin to grow larger and become tender. Your nipples may stick out more, and the tissue that surrounds them (areola) may become darker.  Your gums may bleed and may be sensitive to brushing and flossing.  Dark spots or blotches (chloasma,  mask of pregnancy) may develop on your face. This will likely fade after the baby is born.  Your menstrual periods will stop.  You may have a loss of appetite.  You may develop cravings for certain kinds of food.  You may have changes in your emotions from day to day, such as being excited to be pregnant or being concerned that something may go wrong with the pregnancy and baby.  You may have more vivid and strange dreams.  You may have changes in your hair. These can include thickening of your hair, rapid growth, and changes in texture. Some women also have hair loss during or after pregnancy, or hair that feels dry or thin. Your hair will most likely return to normal after your baby is born. WHAT TO EXPECT AT YOUR PRENATAL VISITS During a routine prenatal visit:  You will be weighed to make sure you and the baby are growing normally.  Your blood pressure will be taken.  Your abdomen will be measured to track your baby's growth.  The fetal heartbeat will be listened to starting around week 10 or 12 of your pregnancy.  Test results from any previous visits will be discussed. Your health care provider may ask you:  How you are feeling.  If you are feeling the baby move.  If you have had any abnormal symptoms, such as leaking fluid, bleeding, severe headaches, or abdominal cramping.  If you are using any tobacco products,   including cigarettes, chewing tobacco, and electronic cigarettes.  If you have any questions. Other tests that may be performed during your first trimester include:  Blood tests to find your blood type and to check for the presence of any previous infections. They will also be used to check for low iron levels (anemia) and Rh antibodies. Later in the pregnancy, blood tests for diabetes will be done along with other tests if problems develop.  Urine tests to check for infections, diabetes, or protein in the urine.  An ultrasound to confirm the proper growth  and development of the baby.  An amniocentesis to check for possible genetic problems.  Fetal screens for spina bifida and Down syndrome.  You may need other tests to make sure you and the baby are doing well.  HIV (human immunodeficiency virus) testing. Routine prenatal testing includes screening for HIV, unless you choose not to have this test. HOME CARE INSTRUCTIONS  Medicines  Follow your health care provider's instructions regarding medicine use. Specific medicines may be either safe or unsafe to take during pregnancy.  Take your prenatal vitamins as directed.  If you develop constipation, try taking a stool softener if your health care provider approves. Diet  Eat regular, well-balanced meals. Choose a variety of foods, such as meat or vegetable-based protein, fish, milk and low-fat dairy products, vegetables, fruits, and whole grain breads and cereals. Your health care provider will help you determine the amount of weight gain that is right for you.  Avoid raw meat and uncooked cheese. These carry germs that can cause birth defects in the baby.  Eating four or five small meals rather than three large meals a day may help relieve nausea and vomiting. If you start to feel nauseous, eating a few soda crackers can be helpful. Drinking liquids between meals instead of during meals also seems to help nausea and vomiting.  If you develop constipation, eat more high-fiber foods, such as fresh vegetables or fruit and whole grains. Drink enough fluids to keep your urine clear or pale yellow. Activity and Exercise  Exercise only as directed by your health care provider. Exercising will help you:  Control your weight.  Stay in shape.  Be prepared for labor and delivery.  Experiencing pain or cramping in the lower abdomen or low back is a good sign that you should stop exercising. Check with your health care provider before continuing normal exercises.  Try to avoid standing for long  periods of time. Move your legs often if you must stand in one place for a long time.  Avoid heavy lifting.  Wear low-heeled shoes, and practice good posture.  You may continue to have sex unless your health care provider directs you otherwise. Relief of Pain or Discomfort  Wear a good support bra for breast tenderness.   Take warm sitz baths to soothe any pain or discomfort caused by hemorrhoids. Use hemorrhoid cream if your health care provider approves.   Rest with your legs elevated if you have leg cramps or low back pain.  If you develop varicose veins in your legs, wear support hose. Elevate your feet for 15 minutes, 3-4 times a day. Limit salt in your diet. Prenatal Care  Schedule your prenatal visits by the twelfth week of pregnancy. They are usually scheduled monthly at first, then more often in the last 2 months before delivery.  Write down your questions. Take them to your prenatal visits.  Keep all your prenatal visits as directed by your   health care provider. Safety  Wear your seat belt at all times when driving.  Make a list of emergency phone numbers, including numbers for family, friends, the hospital, and police and fire departments. General Tips  Ask your health care provider for a referral to a local prenatal education class. Begin classes no later than at the beginning of month 6 of your pregnancy.  Ask for help if you have counseling or nutritional needs during pregnancy. Your health care provider can offer advice or refer you to specialists for help with various needs.  Do not use hot tubs, steam rooms, or saunas.  Do not douche or use tampons or scented sanitary pads.  Do not cross your legs for long periods of time.  Avoid cat litter boxes and soil used by cats. These carry germs that can cause birth defects in the baby and possibly loss of the fetus by miscarriage or stillbirth.  Avoid all smoking, herbs, alcohol, and medicines not prescribed by  your health care provider. Chemicals in these affect the formation and growth of the baby.  Do not use any tobacco products, including cigarettes, chewing tobacco, and electronic cigarettes. If you need help quitting, ask your health care provider. You may receive counseling support and other resources to help you quit.  Schedule a dentist appointment. At home, brush your teeth with a soft toothbrush and be gentle when you floss. SEEK MEDICAL CARE IF:   You have dizziness.  You have mild pelvic cramps, pelvic pressure, or nagging pain in the abdominal area.  You have persistent nausea, vomiting, or diarrhea.  You have a bad smelling vaginal discharge.  You have pain with urination.  You notice increased swelling in your face, hands, legs, or ankles. SEEK IMMEDIATE MEDICAL CARE IF:   You have a fever.  You are leaking fluid from your vagina.  You have spotting or bleeding from your vagina.  You have severe abdominal cramping or pain.  You have rapid weight gain or loss.  You vomit blood or material that looks like coffee grounds.  You are exposed to German measles and have never had them.  You are exposed to fifth disease or chickenpox.  You develop a severe headache.  You have shortness of breath.  You have any kind of trauma, such as from a fall or a car accident.   This information is not intended to replace advice given to you by your health care provider. Make sure you discuss any questions you have with your health care provider.   Document Released: 01/22/2001 Document Revised: 02/18/2014 Document Reviewed: 12/08/2012 [employer] Interactive Patient Education 2016 [employer] Return in 2 weeks for dating US 

## 2015-02-15 ENCOUNTER — Telehealth: Payer: Self-pay | Admitting: Adult Health

## 2015-02-15 LAB — TSH: TSH: 12.66 u[IU]/mL — ABNORMAL HIGH (ref 0.450–4.500)

## 2015-02-15 LAB — CYTOLOGY - PAP

## 2015-02-15 LAB — BETA HCG QUANT (REF LAB): HCG QUANT: 6333 m[IU]/mL

## 2015-02-15 LAB — PROGESTERONE: PROGESTERONE: 17 ng/mL

## 2015-02-15 MED ORDER — LEVOTHYROXINE SODIUM 50 MCG PO TABS: 50.0000 ug | ORAL_TABLET | Freq: Every day | ORAL | Status: DC

## 2015-02-15 NOTE — Telephone Encounter (Signed)
Pt aware of labs, will rx synthroid 50 mcg 1 daily and move US up to this Friday

## 2015-02-17 ENCOUNTER — Other Ambulatory Visit: Payer: Self-pay | Admitting: Adult Health

## 2015-02-17 ENCOUNTER — Ambulatory Visit (INDEPENDENT_AMBULATORY_CARE_PROVIDER_SITE_OTHER): Payer: PRIVATE HEALTH INSURANCE

## 2015-02-17 DIAGNOSIS — O3491 Maternal care for abnormality of pelvic organ, unspecified, first trimester: Secondary | ICD-10-CM

## 2015-02-17 DIAGNOSIS — Z3A01 Less than 8 weeks gestation of pregnancy: Secondary | ICD-10-CM | POA: Diagnosis not present

## 2015-02-17 DIAGNOSIS — O3680X0 Pregnancy with inconclusive fetal viability, not applicable or unspecified: Secondary | ICD-10-CM

## 2015-02-17 NOTE — Progress Notes (Signed)
US 6+2wks GS with YS,rt adnexa wnl (rt oophorectomy), lt ov simple corpus luteal cyst 2.7 x 2.3 x 2.5cm,pt will come back for f/u ultrasound in 10 days per [name].

## 2015-02-24 ENCOUNTER — Other Ambulatory Visit: Payer: Self-pay | Admitting: Obstetrics & Gynecology

## 2015-02-24 DIAGNOSIS — O3680X0 Pregnancy with inconclusive fetal viability, not applicable or unspecified: Secondary | ICD-10-CM

## 2015-02-27 ENCOUNTER — Ambulatory Visit (INDEPENDENT_AMBULATORY_CARE_PROVIDER_SITE_OTHER): Payer: PRIVATE HEALTH INSURANCE

## 2015-02-27 DIAGNOSIS — O3491 Maternal care for abnormality of pelvic organ, unspecified, first trimester: Secondary | ICD-10-CM

## 2015-02-27 DIAGNOSIS — O3680X0 Pregnancy with inconclusive fetal viability, not applicable or unspecified: Secondary | ICD-10-CM

## 2015-02-27 DIAGNOSIS — Z3A01 Less than 8 weeks gestation of pregnancy: Secondary | ICD-10-CM

## 2015-02-27 NOTE — Progress Notes (Signed)
US 6+4wks,single IUP pos FHT 136 bpm,rt adnexa wnl (rt oophorectomy),simple corpus luteal cyst lt ov 3 x 2.9 x 2.4cm,crl 7.497mm

## 2015-03-02 ENCOUNTER — Other Ambulatory Visit: Payer: PRIVATE HEALTH INSURANCE

## 2015-03-07 ENCOUNTER — Ambulatory Visit (INDEPENDENT_AMBULATORY_CARE_PROVIDER_SITE_OTHER): Payer: PRIVATE HEALTH INSURANCE | Admitting: Women's Health

## 2015-03-07 ENCOUNTER — Encounter: Payer: Self-pay | Admitting: Women's Health

## 2015-03-07 VITALS — BP 122/72 | HR 76 | Ht 62.0 in | Wt 271.0 lb

## 2015-03-07 DIAGNOSIS — Z0283 Encounter for blood-alcohol and blood-drug test: Secondary | ICD-10-CM

## 2015-03-07 DIAGNOSIS — O34219 Maternal care for unspecified type scar from previous cesarean delivery: Secondary | ICD-10-CM | POA: Diagnosis not present

## 2015-03-07 DIAGNOSIS — Z3491 Encounter for supervision of normal pregnancy, unspecified, first trimester: Secondary | ICD-10-CM | POA: Diagnosis not present

## 2015-03-07 DIAGNOSIS — Z3490 Encounter for supervision of normal pregnancy, unspecified, unspecified trimester: Secondary | ICD-10-CM | POA: Insufficient documentation

## 2015-03-07 DIAGNOSIS — O99281 Endocrine, nutritional and metabolic diseases complicating pregnancy, first trimester: Secondary | ICD-10-CM

## 2015-03-07 DIAGNOSIS — Z1389 Encounter for screening for other disorder: Secondary | ICD-10-CM

## 2015-03-07 DIAGNOSIS — E039 Hypothyroidism, unspecified: Secondary | ICD-10-CM

## 2015-03-07 DIAGNOSIS — Z3682 Encounter for antenatal screening for nuchal translucency: Secondary | ICD-10-CM

## 2015-03-07 DIAGNOSIS — Z331 Pregnant state, incidental: Secondary | ICD-10-CM

## 2015-03-07 DIAGNOSIS — Z90721 Acquired absence of ovaries, unilateral: Secondary | ICD-10-CM

## 2015-03-07 DIAGNOSIS — Z369 Encounter for antenatal screening, unspecified: Secondary | ICD-10-CM

## 2015-03-07 LAB — POCT URINALYSIS DIPSTICK
GLUCOSE UA: NEGATIVE
Ketones, UA: NEGATIVE
LEUKOCYTES UA: NEGATIVE
NITRITE UA: NEGATIVE
Protein, UA: NEGATIVE

## 2015-03-07 MED ORDER — DOXYLAMINE-PYRIDOXINE 10-10 MG PO TBEC: DELAYED_RELEASE_TABLET | ORAL | Status: DC

## 2015-03-07 NOTE — Progress Notes (Signed)
Subjective:  [name] is a 30 y.o. G71P1002 Caucasian female at [redacted]w[redacted]d by 6wk u/s, being seen today for her first obstetrical visit.  Her obstetrical history is significant for term twin c/s d/t breech/breech presentation.  Pregnancy history fully reviewed. Hypothyroidism, just resumed synthroid a few weeks ago  Patient reports nausea-no vomiting- requests meds, headaches. Denies vb, cramping, uti s/s, abnormal/malodorous vag d/c, or vulvovaginal itching/irritation.  BP 122/72 mmHg  Pulse 76  Wt 271 lb (122.925 kg)  LMP 11/26/2014 (Approximate)  HISTORY: OB History  Gravida Para Term Preterm AB SAB TAB Ectopic Multiple Living  # Outcome Date GA Lbr Len/2nd Weight Sex Delivery Anes PTL Lv  2 Current           1A Term 05/04/10 [redacted]w[redacted]d  6 lb 6 oz (2.892 kg) F CS-LTranv  N Y  1B Term 05/04/10 [redacted]w[redacted]d  6 lb 10 oz (3.005 kg) F CS-LTranv  N Y     Past Medical History  Diagnosis Date  . Migraines   . Fatty liver   . BV (bacterial vaginosis) 11/12/2012  . Irregular menses 05/21/2013  . Obesity   . Other and unspecified ovarian cyst 05/31/2013    Right complex ?cystic vs solid mass on ovary will schedule appt with [facility]  . Thyroid disease   . Breast nodule 10/10/2014  . Breast pain 10/10/2014  . Pregnant 02/14/2015  . Hypothyroid 02/14/2015   Past Surgical History  Procedure Laterality Date  . Cesarean section    . Colonoscopy    . Tonsillectomy    . Wisdom tooth extraction    . Oophorectomy     Family History  Problem Relation Age of Onset  . Diabetes Maternal Grandmother   . Hypertension Maternal Grandmother   . Cancer Maternal Grandmother     breast  . Diabetes Maternal Grandfather   . Hypertension Maternal Grandfather   . Leukemia Maternal Grandfather   . Diabetes Mother   . Hypertension Mother   . Heart attack Mother   . Stroke Mother   . Kidney disease Paternal Grandfather   . Seizures Father   . Other Father     back problems  . Dementia Paternal  Grandmother     Exam   System:     General: Well developed & nourished, no acute distress   Skin: Warm & dry, normal coloration and turgor, no rashes   Neurologic: Alert & oriented, normal mood   Cardiovascular: Regular rate & rhythm   Respiratory: Effort & rate normal, LCTAB, acyanotic   Abdomen: Soft, non tender   Extremities: normal strength, tone  Thin prep pap smear 02/14/15-neg     Assessment:   Pregnancy: [id number] Patient Active Problem List   Diagnosis Date Noted  . Supervision of normal pregnancy 03/07/2015    Priority: High  . Previous cesarean section complicating pregnancy 03/07/2015    Priority: High  . Hypothyroid in pregnancy, antepartum 02/14/2015    Priority: High  . S/P right oophorectomy 03/07/2015  . Breast nodule 10/10/2014  . Breast pain 10/10/2014  . 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 Irregular menses 05/21/2013  . BV (bacterial vaginosis) 11/12/2012    [redacted]w[redacted]d G2P1002 New OB visit Hypothyroidism H/O c/s for breech twins  Plan:  Initial labs drawn Continue prenatal vitamins Problem list reviewed and updated Reviewed n/v relief measures and warning s/s to report Rx diclegis, prior auth approved through [employer] today,  also gave coupon card in case runs through primary insurance  Reviewed recommended weight gain based on pre-gravid BMI Encouraged well-balanced diet Genetic Screening discussed Integrated Screen: requested Cystic fibrosis screening discussed declined Ultrasound discussed; fetal survey: requested Follow up in 4 weeks for 1st it/nt and visit [facility] completed Discussed VBAC, gave consent to take home and review  Booker, Kimberly Randall [facility] 03/07/2015 9:19 AM

## 2015-03-07 NOTE — Patient Instructions (Addendum)
For Headaches:   Stay well hydrated, drink enough water so that your urine is clear, sometimes if you are dehydrated you can get headaches  Eat small frequent meals and snacks, sometimes if you are hungry you can get headaches  Sometimes you get headaches during pregnancy from the pregnancy hormones  You can try tylenol (1-2 regular strength  or 1-2 extra strength ) as directed on the box. The least amount of medication that works is best.   Cool compresses (Immunologist or ice pack) to area of head that is hurting  You can also try drinking a caffeinated drink to see if this will help  If these things aren't helping- try below  For Prevention of Headaches/Migraines:  CoQ10  three times daily  Vitamin B2  daily  Magnesium Oxide 400-600mg  daily  If You Get a Bad Headache/Migraine:  Benadryl    Magnesium Oxide  1 large Gatorade  2 extra strength Tylenol (1,000mg  total)  1 cup coffee or Coke  If this doesn't help please call us @ [phone]   Nausea & Vomiting  Have saltine crackers or pretzels by your bed and eat a few bites before you raise your head out of bed in the morning  Eat small frequent meals throughout the day instead of large meals  Drink plenty of fluids throughout the day to stay hydrated, just don't drink a lot of fluids with your meals.  This can make your stomach fill up faster making you feel sick  Do not brush your teeth right after you eat  Products with real ginger are good for nausea, like ginger ale and ginger hard candy Make sure it says made with real ginger!  Sucking on sour candy like lemon heads is also good for nausea  If your prenatal vitamins make you nauseated, take them at night so you will sleep through the nausea  Sea Bands  If you feel like you need medicine for the nausea & vomiting please let us know  If you are unable to keep any fluids or food down please let us know    Constipation  Drink plenty of fluid, preferably water, throughout the day  Eat foods high in fiber such as fruits, vegetables, and grains  Exercise, such as walking, is a good way to keep your bowels regular  Drink warm fluids, especially warm prune juice, or decaf coffee  Eat a 1/2 cup of real oatmeal (not instant), 1/2 cup applesauce, and 1/2-1 cup warm prune juice every day  If needed, you may take Colace (docusate sodium) stool softener once or twice a day to help keep the stool soft. If you are pregnant, wait until you are out of your first trimester (12-14 weeks of pregnancy)  If you still are having problems with constipation, you may take Miralax once daily as needed to help keep your bowels regular.  If you are pregnant, wait until you are out of your first trimester (12-14 weeks of pregnancy)   First Trimester of Pregnancy The first trimester of pregnancy is from week 1 until the end of week 12 (months 1 through 3). A week after a sperm fertilizes an egg, the egg will implant on the wall of the uterus. This embryo will begin to develop into a baby. Genes from you and your partner are forming the baby. The female genes determine whether the baby is a boy or a girl. At 6-8 weeks, the eyes and face are formed, and the heartbeat can  be seen on ultrasound. At the end of 12 weeks, all the baby's organs are formed.  Now that you are pregnant, you will want to do everything you can to have a healthy baby. Two of the most important things are to get good prenatal care and to follow your health care provider's instructions. Prenatal care is all the medical care you receive before the baby's birth. This care will help prevent, find, and treat any problems during the pregnancy and childbirth. BODY CHANGES Your body goes through many changes during pregnancy. The changes vary from woman to woman.  21. You may gain or lose a couple of pounds at first. 22. You may feel sick to your stomach (nauseous)  and throw up (vomit). If the vomiting is uncontrollable, call your health care provider. 23. You may tire easily. 24. You may develop headaches that can be relieved by medicines approved by your health care provider. 25. You may urinate more often. Painful urination may mean you have a bladder infection. 26. You may develop heartburn as a result of your pregnancy. 27. You may develop constipation because certain hormones are causing the muscles that push waste through your intestines to slow down. 28. You may develop hemorrhoids or swollen, bulging veins (varicose veins). 29. Your breasts may begin to grow larger and become tender. Your nipples may stick out more, and the tissue that surrounds them (areola) may become darker. 30. Your gums may bleed and may be sensitive to brushing and flossing. 31. Dark spots or blotches (chloasma, mask of pregnancy) may develop on your face. This will likely fade after the baby is born. 32. Your menstrual periods will stop. 33. You may have a loss of appetite. 34. You may develop cravings for certain kinds of food. 35. You may have changes in your emotions from day to day, such as being excited to be pregnant or being concerned that something may go wrong with the pregnancy and baby. 36. You may have more vivid and strange dreams. 37. You may have changes in your hair. These can include thickening of your hair, rapid growth, and changes in texture. Some women also have hair loss during or after pregnancy, or hair that feels dry or thin. Your hair will most likely return to normal after your baby is born. WHAT TO EXPECT AT YOUR PRENATAL VISITS During a routine prenatal visit: 13. You will be weighed to make sure you and the baby are growing normally. 14. Your blood pressure will be taken. 15. Your abdomen will be measured to track your baby's growth. 16. The fetal heartbeat will be listened to starting around week 10 or 12 of your pregnancy. 17. Test results  from any previous visits will be discussed. Your health care provider may ask you:  How you are feeling.  If you are feeling the baby move.  If you have had any abnormal symptoms, such as leaking fluid, bleeding, severe headaches, or abdominal cramping.  If you have any questions. Other tests that may be performed during your first trimester include:  Blood tests to find your blood type and to check for the presence of any previous infections. They will also be used to check for low iron levels (anemia) and Rh antibodies. Later in the pregnancy, blood tests for diabetes will be done along with other tests if problems develop.  Urine tests to check for infections, diabetes, or protein in the urine.  An ultrasound to confirm the proper growth and development of the  baby.  An amniocentesis to check for possible genetic problems.  Fetal screens for spina bifida and Down syndrome.  You may need other tests to make sure you and the baby are doing well. HOME CARE INSTRUCTIONS  Medicines  Follow your health care provider's instructions regarding medicine use. Specific medicines may be either safe or unsafe to take during pregnancy.  Take your prenatal vitamins as directed.  If you develop constipation, try taking a stool softener if your health care provider approves. Diet  Eat regular, well-balanced meals. Choose a variety of foods, such as meat or vegetable-based protein, fish, milk and low-fat dairy products, vegetables, fruits, and whole grain breads and cereals. Your health care provider will help you determine the amount of weight gain that is right for you.  Avoid raw meat and uncooked cheese. These carry germs that can cause birth defects in the baby.  Eating four or five small meals rather than three large meals a day may help relieve nausea and vomiting. If you start to feel nauseous, eating a few soda crackers can be helpful. Drinking liquids between meals instead of during  meals also seems to help nausea and vomiting.  If you develop constipation, eat more high-fiber foods, such as fresh vegetables or fruit and whole grains. Drink enough fluids to keep your urine clear or pale yellow. Activity and Exercise  Exercise only as directed by your health care provider. Exercising will help you:  Control your weight.  Stay in shape.  Be prepared for labor and delivery.  Experiencing pain or cramping in the lower abdomen or low back is a good sign that you should stop exercising. Check with your health care provider before continuing normal exercises.  Try to avoid standing for long periods of time. Move your legs often if you must stand in one place for a long time.  Avoid heavy lifting.  Wear low-heeled shoes, and practice good posture.  You may continue to have sex unless your health care provider directs you otherwise. Relief of Pain or Discomfort  Wear a good support bra for breast tenderness.   Take warm sitz baths to soothe any pain or discomfort caused by hemorrhoids. Use hemorrhoid cream if your health care provider approves.   Rest with your legs elevated if you have leg cramps or low back pain.  If you develop varicose veins in your legs, wear support hose. Elevate your feet for 15 minutes, 3-4 times a day. Limit salt in your diet. Prenatal Care  Schedule your prenatal visits by the twelfth week of pregnancy. They are usually scheduled monthly at first, then more often in the last 2 months before delivery.  Write down your questions. Take them to your prenatal visits.  Keep all your prenatal visits as directed by your health care provider. Safety  Wear your seat belt at all times when driving.  Make a list of emergency phone numbers, including numbers for family, friends, the hospital, and police and fire departments. General Tips  Ask your health care provider for a referral to a local prenatal education class. Begin classes no later  than at the beginning of month 6 of your pregnancy.  Ask for help if you have counseling or nutritional needs during pregnancy. Your health care provider can offer advice or refer you to specialists for help with various needs.  Do not use hot tubs, steam rooms, or saunas.  Do not douche or use tampons or scented sanitary pads.  Do not cross your  legs for long periods of time.  Avoid cat litter boxes and soil used by cats. These carry germs that can cause birth defects in the baby and possibly loss of the fetus by miscarriage or stillbirth.  Avoid all smoking, herbs, alcohol, and medicines not prescribed by your health care provider. Chemicals in these affect the formation and growth of the baby.  Schedule a dentist appointment. At home, brush your teeth with a soft toothbrush and be gentle when you floss. SEEK MEDICAL CARE IF:   You have dizziness.  You have mild pelvic cramps, pelvic pressure, or nagging pain in the abdominal area.  You have persistent nausea, vomiting, or diarrhea.  You have a bad smelling vaginal discharge.  You have pain with urination.  You notice increased swelling in your face, hands, legs, or ankles. SEEK IMMEDIATE MEDICAL CARE IF:   You have a fever.  You are leaking fluid from your vagina.  You have spotting or bleeding from your vagina.  You have severe abdominal cramping or pain.  You have rapid weight gain or loss.  You vomit blood or material that looks like coffee grounds.  You are exposed to [place] measles and have never had them.  You are exposed to fifth disease or chickenpox.  You develop a severe headache.  You have shortness of breath.  You have any kind of trauma, such as from a fall or a car accident. Document Released: 01/22/2001 Document Revised: 06/14/2013 Document Reviewed: 12/08/2012 [facility] Patient Information 2015 [place], [place]. This information is not intended to replace advice given to you by your health care  provider. Make sure you discuss any questions you have with your health care provider.

## 2015-03-08 ENCOUNTER — Telehealth: Payer: Self-pay | Admitting: *Deleted

## 2015-03-08 ENCOUNTER — Other Ambulatory Visit: Payer: Self-pay | Admitting: Women's Health

## 2015-03-08 LAB — PMP SCREEN PROFILE (10S), URINE
Amphetamine Screen, Ur: NEGATIVE ng/mL
BARBITURATE SCRN UR: NEGATIVE ng/mL
BENZODIAZEPINE SCREEN, URINE: NEGATIVE ng/mL
CANNABINOIDS UR QL SCN: NEGATIVE ng/mL
CREATININE(CRT), U: 185.3 mg/dL (ref 20.0–300.0)
Cocaine(Metab.)Screen, Urine: NEGATIVE ng/mL
Methadone Scn, Ur: NEGATIVE ng/mL
Opiate Scrn, Ur: NEGATIVE ng/mL
Oxycodone+Oxymorphone Ur Ql Scn: NEGATIVE ng/mL
PCP Scrn, Ur: NEGATIVE ng/mL
PH UR, DRUG SCRN: 5.7 (ref 4.5–8.9)
Propoxyphene, Screen: NEGATIVE ng/mL

## 2015-03-08 LAB — CBC
HEMATOCRIT: 40 % (ref 34.0–46.6)
Hemoglobin: 13.4 g/dL (ref 11.1–15.9)
MCH: 29.5 pg (ref 26.6–33.0)
MCHC: 33.5 g/dL (ref 31.5–35.7)
MCV: 88 fL (ref 79–97)
Platelets: 318 10*3/uL (ref 150–379)
RBC: 4.55 x10E6/uL (ref 3.77–5.28)
RDW: 13.8 % (ref 12.3–15.4)
WBC: 8.1 10*3/uL (ref 3.4–10.8)

## 2015-03-08 LAB — MICROSCOPIC EXAMINATION: Casts: NONE SEEN /lpf

## 2015-03-08 LAB — ABO/RH: Rh Factor: POSITIVE

## 2015-03-08 LAB — URINALYSIS, ROUTINE W REFLEX MICROSCOPIC
BILIRUBIN UA: NEGATIVE
Glucose, UA: NEGATIVE
KETONES UA: NEGATIVE
LEUKOCYTES UA: NEGATIVE
NITRITE UA: NEGATIVE
PH UA: 6 (ref 5.0–7.5)
Protein, UA: NEGATIVE
SPEC GRAV UA: 1.023 (ref 1.005–1.030)
UUROB: 0.2 mg/dL (ref 0.2–1.0)

## 2015-03-08 LAB — VARICELLA ZOSTER ANTIBODY, IGG: VARICELLA: 2803 {index} (ref >165)

## 2015-03-08 LAB — RPR: RPR Ser Ql: NONREACTIVE

## 2015-03-08 LAB — TSH: TSH: 7.16 u[IU]/mL — ABNORMAL HIGH (ref 0.450–4.500)

## 2015-03-08 LAB — RUBELLA SCREEN: Rubella Antibodies, IGG: 3.14 index (ref >0.99)

## 2015-03-08 LAB — HIV ANTIBODY (ROUTINE TESTING W REFLEX): HIV SCREEN 4TH GENERATION: NONREACTIVE

## 2015-03-08 LAB — HEPATITIS B SURFACE ANTIGEN: HEP B S AG: NEGATIVE

## 2015-03-08 LAB — ANTIBODY SCREEN: ANTIBODY SCREEN: NEGATIVE

## 2015-03-08 MED ORDER — LEVOTHYROXINE SODIUM 75 MCG PO TABS: 75.0000 ug | ORAL_TABLET | Freq: Every day | ORAL | Status: DC

## 2015-03-08 NOTE — Telephone Encounter (Signed)
Pt informed to increase her Synthroid to 75 mcg from 50 mcg, will recheck TSH in 4 weeks. Pt also informed of 1 and 1/2 pills, will send in a new Rx for Synthroid 75 mcg. Pt verbalized understanding.

## 2015-03-08 NOTE — Telephone Encounter (Signed)
-----   Message from Cheral Marker, [place] sent at 03/08/2015  9:36 AM EST ----- Regarding: Meds Please tell her to increase synthroid to daily (from ) and we will recheck in 4wks. Can take 1 and 1/2 pills for now and I will also send in new rx.  Cheral Marker, CNM, [facility] 03/08/2015 9:37 AM  ----- Message -----    From: Britta Mccreedy, CMA    Sent: 03/07/2015   8:52 AM      To: Cheral Marker, CNM

## 2015-03-09 LAB — GC/CHLAMYDIA PROBE AMP
Chlamydia trachomatis, NAA: NEGATIVE
NEISSERIA GONORRHOEAE BY PCR: NEGATIVE

## 2015-03-09 LAB — URINE CULTURE

## 2015-03-28 ENCOUNTER — Telehealth: Payer: Self-pay | Admitting: Advanced Practice Midwife

## 2015-03-28 ENCOUNTER — Ambulatory Visit (INDEPENDENT_AMBULATORY_CARE_PROVIDER_SITE_OTHER): Payer: PRIVATE HEALTH INSURANCE | Admitting: Advanced Practice Midwife

## 2015-03-28 VITALS — BP 100/70 | HR 60 | Wt 266.0 lb

## 2015-03-28 DIAGNOSIS — Z3491 Encounter for supervision of normal pregnancy, unspecified, first trimester: Secondary | ICD-10-CM

## 2015-03-28 DIAGNOSIS — Z1389 Encounter for screening for other disorder: Secondary | ICD-10-CM

## 2015-03-28 DIAGNOSIS — Z331 Pregnant state, incidental: Secondary | ICD-10-CM

## 2015-03-28 DIAGNOSIS — R319 Hematuria, unspecified: Secondary | ICD-10-CM

## 2015-03-28 LAB — POCT URINALYSIS DIPSTICK
GLUCOSE UA: NEGATIVE
KETONES UA: NEGATIVE
LEUKOCYTES UA: NEGATIVE
Nitrite, UA: NEGATIVE
Protein, UA: NEGATIVE
RBC UA: 2

## 2015-03-28 NOTE — Progress Notes (Signed)
WORK IN  For lower abd pain, off and on, for a few days. It happens randomly, will have a 10 second or so stabbing type pain all across lower abdomen.  Does not radiate anywhere.  Denies constipation, diarrhea, N/V. Nothing makes it worse, nothing makes it better. Denies bleeding. Denies UTI sx.  Denies vaginal irritation/itching/discharge.  Has Lt ovarian cyst.  Culture urine (dip has large blood, no blood in vagina).

## 2015-03-28 NOTE — Telephone Encounter (Signed)
Pt states for the past few days sharp pains lower abdomen on and off, no constipation, no vaginal bleeding. Pt states  "can be walking and the pain will hit and stop her in mid walk." pt states she does not want to take tylenol for discomfort. Pt given an appt today for evaluation.

## 2015-03-30 LAB — URINE CULTURE

## 2015-04-04 ENCOUNTER — Encounter: Payer: Self-pay | Admitting: Women's Health

## 2015-04-04 ENCOUNTER — Ambulatory Visit (INDEPENDENT_AMBULATORY_CARE_PROVIDER_SITE_OTHER): Payer: PRIVATE HEALTH INSURANCE

## 2015-04-04 ENCOUNTER — Ambulatory Visit (INDEPENDENT_AMBULATORY_CARE_PROVIDER_SITE_OTHER): Payer: PRIVATE HEALTH INSURANCE | Admitting: Women's Health

## 2015-04-04 VITALS — BP 98/64 | HR 80 | Wt 265.0 lb

## 2015-04-04 DIAGNOSIS — E039 Hypothyroidism, unspecified: Secondary | ICD-10-CM

## 2015-04-04 DIAGNOSIS — Z3A12 12 weeks gestation of pregnancy: Secondary | ICD-10-CM

## 2015-04-04 DIAGNOSIS — O99281 Endocrine, nutritional and metabolic diseases complicating pregnancy, first trimester: Secondary | ICD-10-CM

## 2015-04-04 DIAGNOSIS — Z3491 Encounter for supervision of normal pregnancy, unspecified, first trimester: Secondary | ICD-10-CM

## 2015-04-04 DIAGNOSIS — Z3682 Encounter for antenatal screening for nuchal translucency: Secondary | ICD-10-CM

## 2015-04-04 DIAGNOSIS — Z36 Encounter for antenatal screening of mother: Secondary | ICD-10-CM

## 2015-04-04 DIAGNOSIS — Z1389 Encounter for screening for other disorder: Secondary | ICD-10-CM

## 2015-04-04 DIAGNOSIS — Z3492 Encounter for supervision of normal pregnancy, unspecified, second trimester: Secondary | ICD-10-CM

## 2015-04-04 DIAGNOSIS — Z331 Pregnant state, incidental: Secondary | ICD-10-CM

## 2015-04-04 LAB — POCT URINALYSIS DIPSTICK
GLUCOSE UA: NEGATIVE
KETONES UA: NEGATIVE
NITRITE UA: NEGATIVE
Protein, UA: NEGATIVE

## 2015-04-04 NOTE — Progress Notes (Signed)
Low-risk OB appointment G2P1002 [redacted]w[redacted]d Estimated Date of Delivery: 10/19/15 BP 98/64 mmHg  Pulse 80  Wt 265 lb (120.203 kg)  LMP 11/26/2014 (Approximate)  BP, weight, and urine reviewed.  Refer to obstetrical flow sheet for FH & FHR.  No fm yet. Denies cramping, lof, vb, or uti s/s. Still having occ sharp pain lower abd/pelvis- urine cx last vist neg, still has some blood in urine- states this is normal for her during pregnancy. Denies constipation. To increase fluids, reviewed warning s/s to report. Synthroid makes her sleepy. Thinks she wants to try for vag birth, but doesn't want epidural.  Reviewed today's normal nt u/s, s/s to report. Plan:  Continue routine obstetrical care  F/U in 4wks for OB appointment and 2nd IT 1st IT/NT and TSH recheck today

## 2015-04-04 NOTE — Progress Notes (Signed)
[place] 11+5wks,measurements c/w dates,normal lt ov,rt oophorectomy,crl 60.66mm,NT 1.106mm,NB present,fhr 156 bpm

## 2015-04-05 LAB — TSH: TSH: 2.56 u[IU]/mL (ref 0.450–4.500)

## 2015-04-06 LAB — MATERNAL SCREEN, INTEGRATED #1
Crown Rump Length: 60.8 mm
GEST. AGE ON COLLECTION DATE: 12.4 wk
MATERNAL AGE AT EDD: 27.8 a
NUCHAL TRANSLUCENCY (NT): 1.5 mm
NUMBER OF FETUSES: 1
PAPP-A VALUE: 451.3 ng/mL
WEIGHT: 265 [lb_av]

## 2015-05-02 ENCOUNTER — Ambulatory Visit (INDEPENDENT_AMBULATORY_CARE_PROVIDER_SITE_OTHER): Payer: PRIVATE HEALTH INSURANCE | Admitting: Women's Health

## 2015-05-02 ENCOUNTER — Encounter: Payer: Self-pay | Admitting: Women's Health

## 2015-05-02 VITALS — BP 100/58 | HR 84 | Wt 263.0 lb

## 2015-05-02 DIAGNOSIS — O99282 Endocrine, nutritional and metabolic diseases complicating pregnancy, second trimester: Secondary | ICD-10-CM

## 2015-05-02 DIAGNOSIS — O34219 Maternal care for unspecified type scar from previous cesarean delivery: Secondary | ICD-10-CM

## 2015-05-02 DIAGNOSIS — Z8619 Personal history of other infectious and parasitic diseases: Secondary | ICD-10-CM | POA: Insufficient documentation

## 2015-05-02 DIAGNOSIS — Z363 Encounter for antenatal screening for malformations: Secondary | ICD-10-CM

## 2015-05-02 DIAGNOSIS — Z3492 Encounter for supervision of normal pregnancy, unspecified, second trimester: Secondary | ICD-10-CM

## 2015-05-02 DIAGNOSIS — B001 Herpesviral vesicular dermatitis: Secondary | ICD-10-CM

## 2015-05-02 DIAGNOSIS — Z1389 Encounter for screening for other disorder: Secondary | ICD-10-CM

## 2015-05-02 DIAGNOSIS — E039 Hypothyroidism, unspecified: Secondary | ICD-10-CM

## 2015-05-02 DIAGNOSIS — Z3682 Encounter for antenatal screening for nuchal translucency: Secondary | ICD-10-CM

## 2015-05-02 DIAGNOSIS — Z331 Pregnant state, incidental: Secondary | ICD-10-CM

## 2015-05-02 LAB — POCT URINALYSIS DIPSTICK
GLUCOSE UA: NEGATIVE
KETONES UA: NEGATIVE
Leukocytes, UA: NEGATIVE
Nitrite, UA: NEGATIVE

## 2015-05-02 MED ORDER — VALACYCLOVIR HCL 1 G PO TABS: 2000.0000 mg | ORAL_TABLET | Freq: Two times a day (BID) | ORAL | Status: DC

## 2015-05-02 NOTE — Patient Instructions (Addendum)
Aquaphor or Eucerin lotion/ointments Hydrocortisone or Benadryl cream  Second Trimester of Pregnancy The second trimester is from week 13 through week 28, months 4 through 6. The second trimester is often a time when you feel your best. Your body has also adjusted to being pregnant, and you begin to feel better physically. Usually, morning sickness has lessened or quit completely, you may have more energy, and you may have an increase in appetite. The second trimester is also a time when the fetus is growing rapidly. At the end of the sixth month, the fetus is about 9 inches long and weighs about 1 pounds. You will likely begin to feel the baby move (quickening) between 18 and 20 weeks of the pregnancy. BODY CHANGES Your body goes through many changes during pregnancy. The changes vary from woman to woman.   Your weight will continue to increase. You will notice your lower abdomen bulging out.  You may begin to get stretch marks on your hips, abdomen, and breasts.  You may develop headaches that can be relieved by medicines approved by your health care provider.  You may urinate more often because the fetus is pressing on your bladder.  You may develop or continue to have heartburn as a result of your pregnancy.  You may develop constipation because certain hormones are causing the muscles that push waste through your intestines to slow down.  You may develop hemorrhoids or swollen, bulging veins (varicose veins).  You may have back pain because of the weight gain and pregnancy hormones relaxing your joints between the bones in your pelvis and as a result of a shift in weight and the muscles that support your balance.  Your breasts will continue to grow and be tender.  Your gums may bleed and may be sensitive to brushing and flossing.  Dark spots or blotches (chloasma, mask of pregnancy) may develop on your face. This will likely fade after the baby is born.  A dark line from your  belly button to the pubic area (linea nigra) may appear. This will likely fade after the baby is born.  You may have changes in your hair. These can include thickening of your hair, rapid growth, and changes in texture. Some women also have hair loss during or after pregnancy, or hair that feels dry or thin. Your hair will most likely return to normal after your baby is born. WHAT TO EXPECT AT YOUR PRENATAL VISITS During a routine prenatal visit:  You will be weighed to make sure you and the fetus are growing normally.  Your blood pressure will be taken.  Your abdomen will be measured to track your baby's growth.  The fetal heartbeat will be listened to.  Any test results from the previous visit will be discussed. Your health care provider may ask you:  How you are feeling.  If you are feeling the baby move.  If you have had any abnormal symptoms, such as leaking fluid, bleeding, severe headaches, or abdominal cramping.  If you are using any tobacco products, including cigarettes, chewing tobacco, and electronic cigarettes.  If you have any questions. Other tests that may be performed during your second trimester include:  Blood tests that check for:  Low iron levels (anemia).  Gestational diabetes (between 24 and 28 weeks).  Rh antibodies.  Urine tests to check for infections, diabetes, or protein in the urine.  An ultrasound to confirm the proper growth and development of the baby.  An amniocentesis to check for  possible genetic problems.  Fetal screens for spina bifida and Down syndrome.  HIV (human immunodeficiency virus) testing. Routine prenatal testing includes screening for HIV, unless you choose not to have this test. HOME CARE INSTRUCTIONS   Avoid all smoking, herbs, alcohol, and unprescribed drugs. These chemicals affect the formation and growth of the baby.  Do not use any tobacco products, including cigarettes, chewing tobacco, and electronic cigarettes.  If you need help quitting, ask your health care provider. You may receive counseling support and other resources to help you quit.  Follow your health care provider's instructions regarding medicine use. There are medicines that are either safe or unsafe to take during pregnancy.  Exercise only as directed by your health care provider. Experiencing uterine cramps is a good sign to stop exercising.  Continue to eat regular, healthy meals.  Wear a good support bra for breast tenderness.  Do not use hot tubs, steam rooms, or saunas.  Wear your seat belt at all times when driving.  Avoid raw meat, uncooked cheese, cat litter boxes, and soil used by cats. These carry germs that can cause birth defects in the baby.  Take your prenatal vitamins.  Take 1500-2000 mg of calcium daily starting at the 20th week of pregnancy until you deliver your baby.  Try taking a stool softener (if your health care provider approves) if you develop constipation. Eat more high-fiber foods, such as fresh vegetables or fruit and whole grains. Drink plenty of fluids to keep your urine clear or pale yellow.  Take warm sitz baths to soothe any pain or discomfort caused by hemorrhoids. Use hemorrhoid cream if your health care provider approves.  If you develop varicose veins, wear support hose. Elevate your feet for 15 minutes, 3-4 times a day. Limit salt in your diet.  Avoid heavy lifting, wear low heel shoes, and practice good posture.  Rest with your legs elevated if you have leg cramps or low back pain.  Visit your dentist if you have not gone yet during your pregnancy. Use a soft toothbrush to brush your teeth and be gentle when you floss.  A sexual relationship may be continued unless your health care provider directs you otherwise.  Continue to go to all your prenatal visits as directed by your health care provider. SEEK MEDICAL CARE IF:   You have dizziness.  You have mild pelvic cramps, pelvic  pressure, or nagging pain in the abdominal area.  You have persistent nausea, vomiting, or diarrhea.  You have a bad smelling vaginal discharge.  You have pain with urination. SEEK IMMEDIATE MEDICAL CARE IF:   You have a fever.  You are leaking fluid from your vagina.  You have spotting or bleeding from your vagina.  You have severe abdominal cramping or pain.  You have rapid weight gain or loss.  You have shortness of breath with chest pain.  You notice sudden or extreme swelling of your face, hands, ankles, feet, or legs.  You have not felt your baby move in over an hour.  You have severe headaches that do not go away with medicine.  You have vision changes.   This information is not intended to replace advice given to you by your health care provider. Make sure you discuss any questions you have with your health care provider.   Document Released: 01/22/2001 Document Revised: 02/18/2014 Document Reviewed: 03/31/2012 Elsevier Interactive Patient Education [employer]

## 2015-05-02 NOTE — Progress Notes (Signed)
Low-risk OB appointment [id number] Estimated Date of Delivery: 10/19/15 BP 100/58 mmHg  Pulse 84  Wt 263 lb (119.296 kg)  LMP 11/26/2014 (Approximate)  BP, weight, and urine reviewed.  Refer to obstetrical flow sheet for FH & FHR.  No fm yet. Denies cramping, lof, vb, or uti s/s. Hasn't taken synthroid in last week or so, moving and lost temporarily- will let us know if she doesn't find it. Will hold off on 2nd trimester TSH until next visit. Legs swelling and itching- scratching/clawing- making scabs. No recent change to environmental exposures. Swelling trace-1+. To try aquaphor/eucerin for hydration, hydrocortisone or benadryl cream for itching. Elevate legs for swelling.  Does want TOLAC, has reviewed consent, discussed risks/benefits, consent signed today. Fever blister just popped up today. Rx valtrex 2gm bid x 1 day, RF x 1.  Reviewed warning s/s to report. Plan:  Continue routine obstetrical care  F/U in 4wks for OB appointment and anatomy u/s 2nd IT today

## 2015-05-04 LAB — MATERNAL SCREEN, INTEGRATED #2
AFP MOM: 0.84
Alpha-Fetoprotein: 16.7 ng/mL
CROWN RUMP LENGTH: 60.8 mm
DIA MoM: 1.07
DIA Value: 136.9 pg/mL
Estriol, Unconjugated: 0.78 ng/mL
GEST. AGE ON COLLECTION DATE: 12.4 wk
GESTATIONAL AGE: 16.4 wk
HCG VALUE: 32 [IU]/mL
MATERNAL AGE AT EDD: 27.8 a
Nuchal Translucency (NT): 1.5 mm
Nuchal Translucency MoM: 0.97
Number of Fetuses: 1
PAPP-A MOM: 0.96
PAPP-A VALUE: 451.3 ng/mL
Test Results:: NEGATIVE
WEIGHT: 265 [lb_av]
WEIGHT: 265 [lb_av]
hCG MoM: 1.53
uE3 MoM: 1.06

## 2015-05-30 ENCOUNTER — Ambulatory Visit (INDEPENDENT_AMBULATORY_CARE_PROVIDER_SITE_OTHER): Payer: PRIVATE HEALTH INSURANCE

## 2015-05-30 ENCOUNTER — Ambulatory Visit (INDEPENDENT_AMBULATORY_CARE_PROVIDER_SITE_OTHER): Payer: PRIVATE HEALTH INSURANCE | Admitting: Women's Health

## 2015-05-30 ENCOUNTER — Encounter: Payer: Self-pay | Admitting: Women's Health

## 2015-05-30 VITALS — BP 112/62 | HR 72 | Wt 267.0 lb

## 2015-05-30 DIAGNOSIS — O99342 Other mental disorders complicating pregnancy, second trimester: Secondary | ICD-10-CM

## 2015-05-30 DIAGNOSIS — Z36 Encounter for antenatal screening of mother: Secondary | ICD-10-CM | POA: Diagnosis not present

## 2015-05-30 DIAGNOSIS — Z3492 Encounter for supervision of normal pregnancy, unspecified, second trimester: Secondary | ICD-10-CM

## 2015-05-30 DIAGNOSIS — Z363 Encounter for antenatal screening for malformations: Secondary | ICD-10-CM

## 2015-05-30 DIAGNOSIS — Z3A20 20 weeks gestation of pregnancy: Secondary | ICD-10-CM

## 2015-05-30 DIAGNOSIS — Z331 Pregnant state, incidental: Secondary | ICD-10-CM

## 2015-05-30 DIAGNOSIS — F419 Anxiety disorder, unspecified: Secondary | ICD-10-CM | POA: Insufficient documentation

## 2015-05-30 DIAGNOSIS — Z3482 Encounter for supervision of other normal pregnancy, second trimester: Secondary | ICD-10-CM

## 2015-05-30 DIAGNOSIS — L299 Pruritus, unspecified: Secondary | ICD-10-CM

## 2015-05-30 DIAGNOSIS — O99282 Endocrine, nutritional and metabolic diseases complicating pregnancy, second trimester: Secondary | ICD-10-CM

## 2015-05-30 DIAGNOSIS — E039 Hypothyroidism, unspecified: Secondary | ICD-10-CM

## 2015-05-30 DIAGNOSIS — Z1389 Encounter for screening for other disorder: Secondary | ICD-10-CM

## 2015-05-30 LAB — POCT URINALYSIS DIPSTICK
GLUCOSE UA: NEGATIVE
Ketones, UA: NEGATIVE
Leukocytes, UA: NEGATIVE
NITRITE UA: NEGATIVE
Protein, UA: NEGATIVE
RBC UA: NEGATIVE

## 2015-05-30 NOTE — Progress Notes (Signed)
US 19+5wks,measurements c/w dates,cx 3.9cm,ant fundal pl gr 0, rt oophorectomy,normal lt ov,breech,fhr 160 bpm,svp of fluid 5.4cm,efw 315 g,no obvious abnormalities seen

## 2015-05-30 NOTE — Patient Instructions (Addendum)
Nothing to eat or drink after midnight tonight (checking for cholestasis of pregnancy) Come first thing in the morning for labs (open at 8am)  Aquaphor or Eucerin for moisturization, Hydrocortisone or Benadryl cream, cool showers/compresses for itching  Second Trimester of Pregnancy The second trimester is from week 13 through week 28, months 4 through 6. The second trimester is often a time when you feel your best. Your body has also adjusted to being pregnant, and you begin to feel better physically. Usually, morning sickness has lessened or quit completely, you may have more energy, and you may have an increase in appetite. The second trimester is also a time when the fetus is growing rapidly. At the end of the sixth month, the fetus is about 9 inches long and weighs about 1 pounds. You will likely begin to feel the baby move (quickening) between 18 and 20 weeks of the pregnancy. BODY CHANGES Your body goes through many changes during pregnancy. The changes vary from woman to woman.   Your weight will continue to increase. You will notice your lower abdomen bulging out.  You may begin to get stretch marks on your hips, abdomen, and breasts.  You may develop headaches that can be relieved by medicines approved by your health care provider.  You may urinate more often because the fetus is pressing on your bladder.  You may develop or continue to have heartburn as a result of your pregnancy.  You may develop constipation because certain hormones are causing the muscles that push waste through your intestines to slow down.  You may develop hemorrhoids or swollen, bulging veins (varicose veins).  You may have back pain because of the weight gain and pregnancy hormones relaxing your joints between the bones in your pelvis and as a result of a shift in weight and the muscles that support your balance.  Your breasts will continue to grow and be tender.  Your gums may bleed and may be  sensitive to brushing and flossing.  Dark spots or blotches (chloasma, mask of pregnancy) may develop on your face. This will likely fade after the baby is born.  A dark line from your belly button to the pubic area (linea nigra) may appear. This will likely fade after the baby is born.  You may have changes in your hair. These can include thickening of your hair, rapid growth, and changes in texture. Some women also have hair loss during or after pregnancy, or hair that feels dry or thin. Your hair will most likely return to normal after your baby is born. WHAT TO EXPECT AT YOUR PRENATAL VISITS During a routine prenatal visit:  You will be weighed to make sure you and the fetus are growing normally.  Your blood pressure will be taken.  Your abdomen will be measured to track your baby's growth.  The fetal heartbeat will be listened to.  Any test results from the previous visit will be discussed. Your health care provider may ask you:  How you are feeling.  If you are feeling the baby move.  If you have had any abnormal symptoms, such as leaking fluid, bleeding, severe headaches, or abdominal cramping.  If you are using any tobacco products, including cigarettes, chewing tobacco, and electronic cigarettes.  If you have any questions. Other tests that may be performed during your second trimester include:  Blood tests that check for:  Low iron levels (anemia).  Gestational diabetes (between 24 and 28 weeks).  Rh antibodies.  Urine  tests to check for infections, diabetes, or protein in the urine.  An ultrasound to confirm the proper growth and development of the baby.  An amniocentesis to check for possible genetic problems.  Fetal screens for spina bifida and Down syndrome.  HIV (human immunodeficiency virus) testing. Routine prenatal testing includes screening for HIV, unless you choose not to have this test. HOME CARE INSTRUCTIONS   Avoid all smoking, herbs,  alcohol, and unprescribed drugs. These chemicals affect the formation and growth of the baby.  Do not use any tobacco products, including cigarettes, chewing tobacco, and electronic cigarettes. If you need help quitting, ask your health care provider. You may receive counseling support and other resources to help you quit.  Follow your health care provider's instructions regarding medicine use. There are medicines that are either safe or unsafe to take during pregnancy.  Exercise only as directed by your health care provider. Experiencing uterine cramps is a good sign to stop exercising.  Continue to eat regular, healthy meals.  Wear a good support bra for breast tenderness.  Do not use hot tubs, steam rooms, or saunas.  Wear your seat belt at all times when driving.  Avoid raw meat, uncooked cheese, cat litter boxes, and soil used by cats. These carry germs that can cause birth defects in the baby.  Take your prenatal vitamins.  Take 1500-2000 mg of calcium daily starting at the 20th week of pregnancy until you deliver your baby.  Try taking a stool softener (if your health care provider approves) if you develop constipation. Eat more high-fiber foods, such as fresh vegetables or fruit and whole grains. Drink plenty of fluids to keep your urine clear or pale yellow.  Take warm sitz baths to soothe any pain or discomfort caused by hemorrhoids. Use hemorrhoid cream if your health care provider approves.  If you develop varicose veins, wear support hose. Elevate your feet for 15 minutes, 3-4 times a day. Limit salt in your diet.  Avoid heavy lifting, wear low heel shoes, and practice good posture.  Rest with your legs elevated if you have leg cramps or low back pain.  Visit your dentist if you have not gone yet during your pregnancy. Use a soft toothbrush to brush your teeth and be gentle when you floss.  A sexual relationship may be continued unless your health care provider directs  you otherwise.  Continue to go to all your prenatal visits as directed by your health care provider. SEEK MEDICAL CARE IF:   You have dizziness.  You have mild pelvic cramps, pelvic pressure, or nagging pain in the abdominal area.  You have persistent nausea, vomiting, or diarrhea.  You have a bad smelling vaginal discharge.  You have pain with urination. SEEK IMMEDIATE MEDICAL CARE IF:   You have a fever.  You are leaking fluid from your vagina.  You have spotting or bleeding from your vagina.  You have severe abdominal cramping or pain.  You have rapid weight gain or loss.  You have shortness of breath with chest pain.  You notice sudden or extreme swelling of your face, hands, ankles, feet, or legs.  You have not felt your baby move in over an hour.  You have severe headaches that do not go away with medicine.  You have vision changes.   This information is not intended to replace advice given to you by your health care provider. Make sure you discuss any questions you have with your health care provider.  Document Released: 01/22/2001 Document Revised: 02/18/2014 Document Reviewed: 03/31/2012 Elsevier Interactive Patient Education 2016 [employer] Inc.  Generalized Anxiety Disorder Generalized anxiety disorder (GAD) is a mental disorder. It interferes with life functions, including relationships, work, and school. GAD is different from normal anxiety, which everyone experiences at some point in their lives in response to specific life events and activities. Normal anxiety actually helps us prepare for and get through these life events and activities. Normal anxiety goes away after the event or activity is over.  GAD causes anxiety that is not necessarily related to specific events or activities. It also causes excess anxiety in proportion to specific events or activities. The anxiety associated with GAD is also difficult to control. GAD can vary from mild to severe.  People with severe GAD can have intense waves of anxiety with physical symptoms (panic attacks).  SYMPTOMS The anxiety and worry associated with GAD are difficult to control. This anxiety and worry are related to many life events and activities and also occur more days than not for 6 months or longer. People with GAD also have three or more of the following symptoms (one or more in children):  Restlessness.   Fatigue.  Difficulty concentrating.   Irritability.  Muscle tension.  Difficulty sleeping or unsatisfying sleep. DIAGNOSIS GAD is diagnosed through an assessment by your health care provider. Your health care provider will ask you questions aboutyour mood,physical symptoms, and events in your life. Your health care provider may ask you about your medical history and use of alcohol or drugs, including prescription medicines. Your health care provider may also do a physical exam and blood tests. Certain medical conditions and the use of certain substances can cause symptoms similar to those associated with GAD. Your health care provider may refer you to a mental health specialist for further evaluation. TREATMENT The following therapies are usually used to treat GAD:   Medication. Antidepressant medication usually is prescribed for long-term daily control. Antianxiety medicines may be added in severe cases, especially when panic attacks occur.   Talk therapy (psychotherapy). Certain types of talk therapy can be helpful in treating GAD by providing support, education, and guidance. A form of talk therapy called cognitive behavioral therapy can teach you healthy ways to think about and react to daily life events and activities.  Stress managementtechniques. These include yoga, meditation, and exercise and can be very helpful when they are practiced regularly. A mental health specialist can help determine which treatment is best for you. Some people see improvement with one therapy.  However, other people require a combination of therapies.   This information is not intended to replace advice given to you by your health care provider. Make sure you discuss any questions you have with your health care provider.   Document Released: 05/25/2012 Document Revised: 02/18/2014 Document Reviewed: 05/25/2012 Elsevier Interactive Patient Education [employer]

## 2015-05-30 NOTE — Progress Notes (Signed)
Low-risk OB appointment [id number] Estimated Date of Delivery: 10/19/15 BP 112/62 mmHg  Pulse 72  Wt 267 lb (121.11 kg)  LMP 11/26/2014 (Approximate)  BP, weight, and urine reviewed.  Refer to obstetrical flow sheet for FH & FHR.  Reports good fm.  Denies regular uc's, lof, vb, or uti s/s. Itching all over, all day, worse after she gets off work, has woken her from sleep twice now- some on palms of hands, none on soles of feet. Has not tried hydrocortisone or benadryl cream as discussed for leg itching at last visit. To try that as well as cool showers/compresses. Will get fasting bile acids in the morning- npo after midnight tonight. Will also check 2nd trimester TSH at same time.  Anxiety- has h/o same, has been on celexa and lexapro in past. Does not feel she needs to be back on meds at this time. Is able to talk to others about other things to help get her mind off what's bothering her. Denies depression/SI. Denies counseling. To let us know if feels like needs to back on meds.  Reviewed today's anatomy u/s- got all but 2 vies needed- is going to stop back by on way out to see if can get remaining views- u/s was otherwise normal. Discussed warning s/s to report Plan:  Continue routine obstetrical care  F/U in the am for fasting bile acids/tsh (no visit), then 4wks for OB appointment

## 2015-05-31 ENCOUNTER — Other Ambulatory Visit: Payer: PRIVATE HEALTH INSURANCE

## 2015-05-31 ENCOUNTER — Telehealth: Payer: Self-pay | Admitting: Advanced Practice Midwife

## 2015-05-31 NOTE — Telephone Encounter (Signed)
Pt came into the office stating that she has a client that has been taking radiation and she would like to know if she should be around that being pregnant, Please contact pt

## 2015-05-31 NOTE — Telephone Encounter (Signed)
Spoke with pt giving her Kim's recommendations. Pt voiced understanding. JSY

## 2015-06-01 ENCOUNTER — Telehealth: Payer: Self-pay | Admitting: Women's Health

## 2015-06-01 ENCOUNTER — Telehealth: Payer: Self-pay | Admitting: Obstetrics & Gynecology

## 2015-06-01 LAB — BILE ACIDS, TOTAL: Bile Acids Total: 15.1 umol/L (ref 4.7–24.5)

## 2015-06-01 LAB — TSH: TSH: 6.34 u[IU]/mL — ABNORMAL HIGH (ref 0.450–4.500)

## 2015-06-01 MED ORDER — LEVOTHYROXINE SODIUM 100 MCG PO TABS: 100.0000 ug | ORAL_TABLET | Freq: Every day | ORAL | Status: DC

## 2015-06-02 NOTE — Telephone Encounter (Signed)
Pt informed of abnormal TSH, Dr. Despina HiddenEure increased Synthroid to 100 mcg, Rx sent to [facility]. Bile acids results WNL. Pt verbalized understanding.

## 2015-06-04 ENCOUNTER — Inpatient Hospital Stay (HOSPITAL_COMMUNITY)
Admission: AD | Admit: 2015-06-04 | Discharge: 2015-06-04 | Disposition: A | Discharge status: Home / Self Care | Payer: PRIVATE HEALTH INSURANCE | Source: Ambulatory Visit | Attending: Obstetrics & Gynecology | Admitting: Obstetrics & Gynecology

## 2015-06-04 ENCOUNTER — Encounter (HOSPITAL_COMMUNITY): Payer: Self-pay

## 2015-06-04 DIAGNOSIS — W19XXXA Unspecified fall, initial encounter: Secondary | ICD-10-CM | POA: Insufficient documentation

## 2015-06-04 DIAGNOSIS — Z3482 Encounter for supervision of other normal pregnancy, second trimester: Secondary | ICD-10-CM | POA: Diagnosis not present

## 2015-06-04 DIAGNOSIS — Z043 Encounter for examination and observation following other accident: Secondary | ICD-10-CM | POA: Insufficient documentation

## 2015-06-04 DIAGNOSIS — Y92009 Unspecified place in unspecified non-institutional (private) residence as the place of occurrence of the external cause: Secondary | ICD-10-CM | POA: Diagnosis not present

## 2015-06-04 DIAGNOSIS — Z3A20 20 weeks gestation of pregnancy: Secondary | ICD-10-CM | POA: Insufficient documentation

## 2015-06-04 DIAGNOSIS — Z3492 Encounter for supervision of normal pregnancy, unspecified, second trimester: Secondary | ICD-10-CM

## 2015-06-04 LAB — URINE MICROSCOPIC-ADD ON

## 2015-06-04 LAB — URINALYSIS, ROUTINE W REFLEX MICROSCOPIC
Bilirubin Urine: NEGATIVE
Glucose, UA: NEGATIVE mg/dL
KETONES UR: NEGATIVE mg/dL
NITRITE: NEGATIVE
PH: 5.5 (ref 5.0–8.0)
Protein, ur: NEGATIVE mg/dL

## 2015-06-04 NOTE — MAU Provider Note (Signed)
History     CSN: [id number]  Arrival date and time: 06/04/15 [id number]   First Provider Initiated Contact with Patient 06/04/15 2015      Chief Complaint  Patient presents with  . Fall   HPI  This is a 30 year old G2P1002 at 3722w3d who presented to the MAU after falling at home yesterday evening. She slipped on mud outside and landed on her left side/hip. She did not hit her head or lose consciousness. She did not directly hit her abdomen. She felt the baby move yesterday evening and this morning, but did not feel the baby move much during the day today. Today, she has also noticed that she is having some pain radiate from her left side to the middle of her abdomen. The pain is "sharp". She has been walking around like normal.   She denies any vaginal bleeding or leakage of fluids. She is not having any lower abdominal pain that feels like contractions.   Past Medical History  Diagnosis Date  . Migraines   . Fatty liver   . BV (bacterial vaginosis) 11/12/2012  . Irregular menses 05/21/2013  . Obesity   . Other and unspecified ovarian cyst 05/31/2013    Right complex ?cystic vs solid mass on ovary will schedule appt with [facility]  . Thyroid disease   . Breast nodule 10/10/2014  . Breast pain 10/10/2014  . Pregnant 02/14/2015  . Hypothyroid 02/14/2015    Past Surgical History  Procedure Laterality Date  . Cesarean section    . Colonoscopy    . Tonsillectomy    . Wisdom tooth extraction    . Oophorectomy      Family History  Problem Relation Age of Onset  . Diabetes Maternal Grandmother   . Hypertension Maternal Grandmother   . Cancer Maternal Grandmother     breast  . Diabetes Maternal Grandfather   . Hypertension Maternal Grandfather   . Leukemia Maternal Grandfather   . Diabetes Mother   . Hypertension Mother   . Heart attack Mother   . Stroke Mother   . Kidney disease Paternal Grandfather   . Seizures Father   . Other Father     back problems  . Dementia Paternal  Grandmother     Social History  Substance Use Topics  . Smoking status: Former Smoker -- 0.25 packs/day for 8 years    Types: Cigarettes    Quit date: 10/07/2010  . Smokeless tobacco: Never Used  . Alcohol Use: No     Comment: occ.; not now    Allergies:  Allergies  Allergen Reactions  . Imitrex [Sumatriptan]     Makes migraines worse.   . Maxalt [Rizatriptan]     Makes migraines worse  . Phenergan [Promethazine Hcl] Nausea And Vomiting  . Tape Other (See Comments)    Blisters on abdomen after C Section    Prescriptions prior to admission  Medication Sig Dispense Refill Last Dose  . acetaminophen (TYLENOL) 500 MG tablet Take 500 mg by mouth as needed. Reported on 05/02/2015   Taking  . Doxylamine-Pyridoxine (DICLEGIS) 10-10 MG TBEC 2 tabs q hs, if sx persist add 1 tab q am on day 3, if sx persist add 1 tab q afternoon on day 4 (Patient not taking: Reported on 03/28/2015) 100 tablet 6 Not Taking  . levothyroxine (SYNTHROID, LEVOTHROID) 100 MCG tablet Take 1 tablet (100 mcg total) by mouth daily before breakfast. 30 tablet 11   . Miconazole Nitrate (MONISTAT 7 SIMPLY CURE  VA) Place vaginally. Reported on 05/30/2015   Not Taking  . Pediatric Multiple Vit-C-FA (FLINSTONES GUMMIES OMEGA-3 DHA PO) Take by mouth. Reported on 05/30/2015   Not Taking  . valACYclovir (VALTREX) 1000 MG tablet Take 2 tablets (2,000 mg total) by mouth 2 (two) times daily. X 1 day (Patient not taking: Reported on 05/30/2015) 4 tablet 1 Not Taking    Review of Systems  Constitutional: Negative for fever and chills.  Eyes: Negative for blurred vision.  Respiratory: Negative for shortness of breath.   Cardiovascular: Negative for chest pain.  Gastrointestinal: Positive for abdominal pain. Negative for heartburn, nausea and vomiting.  Genitourinary: Positive for frequency. Negative for dysuria.  Musculoskeletal: Negative for myalgias.  Skin: Negative for rash.  Neurological: Negative for dizziness, weakness and  headaches.  Endo/Heme/Allergies: Negative for environmental allergies.   Physical Exam   Blood pressure 83/47, pulse 83, temperature 97.9 F (36.6 C), temperature source Oral, resp. rate 18, height 5' 3.5" (1.613 m), weight 266 lb 12.8 oz (121.02 kg), last menstrual period 11/26/2014, SpO2 99 %.  Physical Exam  Nursing note and vitals reviewed. Constitutional: She is oriented to person, place, and time. She appears well-developed and well-nourished. No distress.  Eyes: No scleral icterus.  Neck: Normal range of motion.  Cardiovascular: Normal rate.   Respiratory: Effort normal.  GI: Soft. There is no tenderness. There is no rebound and no guarding.  gravid  Genitourinary:  Cervix closed, thick, and posterior.  Musculoskeletal: Normal range of motion.  Left hip without ecchymoses, no tenderness to palpation  Neurological: She is alert and oriented to person, place, and time.  Skin: Skin is warm and dry. No rash noted.    MAU Course  Procedures  MDM This is a 30 year old G2P1002 at [redacted]w[redacted]d who presented to the MAU after falling on her left hip at home. Fetal heart rate was obtained. Pt has not had any vaginal bleeding or leakage of fluids. Cervix is closed on digital exam. No concerns for preterm labor.  Assessment and Plan  #Fall: Mild left-sided trauma. Fetal heart rate present. - Pt safe for discharge home - Advised to use Tylenol as needed and take warm baths to help soothe her sore muscles - Return precautions discussed - Continue routine prenatal care  Hilton Sinclair 06/04/2015, 8:28 PM

## 2015-06-04 NOTE — Discharge Instructions (Signed)
It was so nice to meet you!  Your baby had a good heartbeat in the MAU. Your cervix was closed, so we do not think you are in preterm labor.  For your abdominal pain, you can take Tylenol 650mg  every 6 hours as needed. I would also recommend taking warm baths to help soothe your sore muscles.  If you have any vaginal bleeding, gush of fluids, or severe crampy lower abdominal pain that comes and goes, please come back to the MAU.

## 2015-06-04 NOTE — MAU Note (Signed)
Pt states that she fell last night. States that she fell on left side. Having some lower abdominal "discomfort" and cramping that started today. Has felt a decrease in movement today. Denies vaginal bleeding or LOF.

## 2015-06-05 ENCOUNTER — Other Ambulatory Visit: Payer: Self-pay | Admitting: Women's Health

## 2015-06-05 ENCOUNTER — Telehealth: Payer: Self-pay | Admitting: Women's Health

## 2015-06-05 DIAGNOSIS — O99282 Endocrine, nutritional and metabolic diseases complicating pregnancy, second trimester: Secondary | ICD-10-CM

## 2015-06-05 DIAGNOSIS — O26649 Intrahepatic cholestasis of pregnancy, unspecified trimester: Secondary | ICD-10-CM | POA: Insufficient documentation

## 2015-06-05 DIAGNOSIS — E039 Hypothyroidism, unspecified: Secondary | ICD-10-CM

## 2015-06-05 DIAGNOSIS — O26619 Liver and biliary tract disorders in pregnancy, unspecified trimester: Secondary | ICD-10-CM

## 2015-06-05 DIAGNOSIS — K831 Obstruction of bile duct: Secondary | ICD-10-CM

## 2015-06-05 DIAGNOSIS — O26612 Liver and biliary tract disorders in pregnancy, second trimester: Principal | ICD-10-CM

## 2015-06-05 LAB — SPECIMEN STATUS REPORT

## 2015-06-05 MED ORDER — URSODIOL 300 MG PO CAPS: 300.0000 mg | ORAL_CAPSULE | Freq: Three times a day (TID) | ORAL | Status: DC

## 2015-06-05 NOTE — Telephone Encounter (Signed)
Pt notified bile acids are elevated for pregnancy, 15. Discussed w/ LHE, although hasn't seen ICP this early in pregnancy, not impossible. Will go ahead and treat as such since she is also symptomatic. Called lab, added CMP to last weeks labs. Rx ursodiol 300mg  TID. Added f/u u/s to next visit per guidelines. Already knows about elevated TSH and synthroid increased to 100mcg daily on 4/21- will check bile acids/cmp/tsh at next visit.  Cheral MarkerKimberly R. Terrace Chiem, CNM, [facility]NP-BC 06/05/2015 11:20 AM

## 2015-06-07 LAB — COMPREHENSIVE METABOLIC PANEL
A/G RATIO: 1.3 (ref 1.2–2.2)
ALT: 17 IU/L (ref 0–32)
AST: 24 IU/L (ref 0–40)
Albumin: 3.3 g/dL — ABNORMAL LOW (ref 3.5–5.5)
Alkaline Phosphatase: 186 IU/L — ABNORMAL HIGH (ref 39–117)
BILIRUBIN TOTAL: 0.2 mg/dL (ref 0.0–1.2)
BUN/Creatinine Ratio: 11 (ref 9–23)
BUN: 5 mg/dL — ABNORMAL LOW (ref 6–20)
CHLORIDE: 103 mmol/L (ref 96–106)
CO2: 19 mmol/L (ref 18–29)
Calcium: 8.4 mg/dL — ABNORMAL LOW (ref 8.7–10.2)
Creatinine, Ser: 0.45 mg/dL — ABNORMAL LOW (ref 0.57–1.00)
GFR calc non Af Amer: 138 mL/min/{1.73_m2} (ref >59)
GFR, EST AFRICAN AMERICAN: 159 mL/min/{1.73_m2} (ref >59)
GLOBULIN, TOTAL: 2.5 g/dL (ref 1.5–4.5)
Glucose: 81 mg/dL (ref 65–99)
POTASSIUM: 3.8 mmol/L (ref 3.5–5.2)
Sodium: 139 mmol/L (ref 134–144)
Total Protein: 5.8 g/dL — ABNORMAL LOW (ref 6.0–8.5)

## 2015-06-07 LAB — SPECIMEN STATUS REPORT

## 2015-06-12 ENCOUNTER — Telehealth: Payer: Self-pay | Admitting: Women's Health

## 2015-06-12 NOTE — Telephone Encounter (Signed)
Pt states increased her Levothyroxine to 100 mcg from 88  mcg, feeling drowsy, irritable, light headed and dizzy. Pt states she only has the symptoms after she takes the Levothyroxine. Pt also states she is taking the Ursodiol 300 mg as prescribed. Please advise.

## 2015-06-12 NOTE — Telephone Encounter (Signed)
Pt informed per Joellyn HaffKim Booker, CNM can take a few weeks for body to adjust to changes in dosage, Make sure eating small freque3snt snacks/meals w/protein, drinking plenty of fluids. Pt verbalized understanding.

## 2015-06-12 NOTE — Telephone Encounter (Signed)
Pt states that she would like a call back from Kim, Pt did not state the reason why. Please contact pt

## 2015-06-27 ENCOUNTER — Ambulatory Visit (INDEPENDENT_AMBULATORY_CARE_PROVIDER_SITE_OTHER): Payer: PRIVATE HEALTH INSURANCE | Admitting: Advanced Practice Midwife

## 2015-06-27 ENCOUNTER — Ambulatory Visit (INDEPENDENT_AMBULATORY_CARE_PROVIDER_SITE_OTHER): Payer: PRIVATE HEALTH INSURANCE

## 2015-06-27 ENCOUNTER — Encounter: Payer: PRIVATE HEALTH INSURANCE | Admitting: Advanced Practice Midwife

## 2015-06-27 ENCOUNTER — Encounter: Payer: Self-pay | Admitting: Advanced Practice Midwife

## 2015-06-27 VITALS — BP 112/70 | HR 83 | Wt 274.0 lb

## 2015-06-27 DIAGNOSIS — K831 Obstruction of bile duct: Secondary | ICD-10-CM

## 2015-06-27 DIAGNOSIS — Z1389 Encounter for screening for other disorder: Secondary | ICD-10-CM

## 2015-06-27 DIAGNOSIS — O09892 Supervision of other high risk pregnancies, second trimester: Secondary | ICD-10-CM

## 2015-06-27 DIAGNOSIS — O26612 Liver and biliary tract disorders in pregnancy, second trimester: Secondary | ICD-10-CM

## 2015-06-27 DIAGNOSIS — Z3A24 24 weeks gestation of pregnancy: Secondary | ICD-10-CM | POA: Diagnosis not present

## 2015-06-27 DIAGNOSIS — Z331 Pregnant state, incidental: Secondary | ICD-10-CM

## 2015-06-27 LAB — POCT URINALYSIS DIPSTICK
Glucose, UA: NEGATIVE
KETONES UA: NEGATIVE
LEUKOCYTES UA: NEGATIVE
Nitrite, UA: NEGATIVE
PROTEIN UA: NEGATIVE
RBC UA: NEGATIVE

## 2015-06-27 NOTE — Progress Notes (Signed)
Fetal Surveillance Testing today:  [name]   High Risk Pregnancy Diagnosis(es):   ICP, dx at 19 weeks  G2P1002 5725w5d Estimated Date of Delivery: 10/19/15  Blood pressure 112/70, pulse 83, weight 274 lb (124.286 kg), last menstrual period 11/26/2014.  Urinalysis: Negative   HPI: The patient is being seen today for ongoing management of the above. Today she reports feeling very tired after takng levothyroxine. Feels like dosage is too high. Feels fine when she doesn't take it, no energy when she takes it. Feels angry all the time, started when started both of her meds (synthroid and urisidol).     BP weight and urine results all reviewed and noted. Patient reports good fetal movement, denies any bleeding and no rupture of membranes symptoms or regular contractions.  Fetal Heart rate:  135 Edema:  no  Patient is without complaints other than noted in her HPI. All questions were answered.  All lab and sonogram results have been reviewed. Comments:  [name] 23+5 wks,cephalic,cx 4.2 cm,ant pl,rt oophorectomy,bilat adnexa's wnl,fhr 135 bpm,svp of fluid 5.2cm,efw 665 g 53%  Assessment:  1.  Pregnancy at 1525w5d,  Estimated Date of Delivery: 10/19/15 :                          2.  ICP                        3.  Mood changes, ? D/t meds  Medication(s) Plans:  Continue urisol 300mg  BID  Treatment Plan:  Repeat bile acids/cmp q 4wks: (ordered ) More often 3rd trimester:        U/S @ dx then q 3wks          Weekly BPP @ 28wks then 2x/wk testing @ 32wks       Deliver PRN or 37wks:____  Return in about 3 weeks (around 07/18/2015) for PN2/LROB, US:EFW. for appointment for high risk OB care  No orders of the defined types were placed in this encounter.   Orders Placed This Encounter  Procedures  . [name] OB Follow Up  . Comprehensive metabolic panel  . TSH  . Bile acids, total  . POCT urinalysis dipstick

## 2015-06-27 NOTE — Progress Notes (Signed)
Pt states that she needs to discuss her levothyroxine with Drenda FreezeFran.

## 2015-06-27 NOTE — Patient Instructions (Signed)

## 2015-06-27 NOTE — Progress Notes (Signed)
US 23+5 wks,cephalic,cx 4.2 cm,ant pl,rt oophorectomy,bilat adnexa's wnl,fhr 135 bpm,svp of fluid 5.2cm,efw 665 g 53%

## 2015-06-29 LAB — COMPREHENSIVE METABOLIC PANEL
A/G RATIO: 1.2 (ref 1.2–2.2)
ALT: 13 IU/L (ref 0–32)
AST: 13 IU/L (ref 0–40)
Albumin: 3.2 g/dL — ABNORMAL LOW (ref 3.5–5.5)
Alkaline Phosphatase: 170 IU/L — ABNORMAL HIGH (ref 39–117)
BILIRUBIN TOTAL: 0.3 mg/dL (ref 0.0–1.2)
BUN / CREAT RATIO: 14 (ref 9–23)
BUN: 6 mg/dL (ref 6–20)
CALCIUM: 8.4 mg/dL — AB (ref 8.7–10.2)
CHLORIDE: 104 mmol/L (ref 96–106)
CO2: 19 mmol/L (ref 18–29)
Creatinine, Ser: 0.43 mg/dL — ABNORMAL LOW (ref 0.57–1.00)
GFR, EST AFRICAN AMERICAN: 161 mL/min/{1.73_m2} (ref >59)
GFR, EST NON AFRICAN AMERICAN: 140 mL/min/{1.73_m2} (ref >59)
GLOBULIN, TOTAL: 2.7 g/dL (ref 1.5–4.5)
Glucose: 82 mg/dL (ref 65–99)
POTASSIUM: 3.9 mmol/L (ref 3.5–5.2)
SODIUM: 138 mmol/L (ref 134–144)
TOTAL PROTEIN: 5.9 g/dL — AB (ref 6.0–8.5)

## 2015-06-29 LAB — BILE ACIDS, TOTAL: Bile Acids Total: 6.9 umol/L (ref 4.7–24.5)

## 2015-06-29 LAB — TSH: TSH: 5.36 u[IU]/mL — ABNORMAL HIGH (ref 0.450–4.500)

## 2015-07-06 ENCOUNTER — Telehealth: Payer: Self-pay | Admitting: *Deleted

## 2015-07-06 ENCOUNTER — Other Ambulatory Visit: Payer: Self-pay | Admitting: Advanced Practice Midwife

## 2015-07-06 MED ORDER — THYROID 65 MG PO TABS: 65.0000 mg | ORAL_TABLET | Freq: Every day | ORAL | Status: DC

## 2015-07-19 NOTE — Telephone Encounter (Signed)
RX done. 

## 2015-07-20 ENCOUNTER — Ambulatory Visit (INDEPENDENT_AMBULATORY_CARE_PROVIDER_SITE_OTHER): Payer: PRIVATE HEALTH INSURANCE

## 2015-07-20 ENCOUNTER — Other Ambulatory Visit: Payer: PRIVATE HEALTH INSURANCE

## 2015-07-20 ENCOUNTER — Ambulatory Visit (INDEPENDENT_AMBULATORY_CARE_PROVIDER_SITE_OTHER): Payer: PRIVATE HEALTH INSURANCE | Admitting: Obstetrics & Gynecology

## 2015-07-20 ENCOUNTER — Encounter: Payer: Self-pay | Admitting: Obstetrics & Gynecology

## 2015-07-20 VITALS — BP 110/70 | HR 88 | Wt 271.0 lb

## 2015-07-20 DIAGNOSIS — Z131 Encounter for screening for diabetes mellitus: Secondary | ICD-10-CM

## 2015-07-20 DIAGNOSIS — Z331 Pregnant state, incidental: Secondary | ICD-10-CM

## 2015-07-20 DIAGNOSIS — Z3A27 27 weeks gestation of pregnancy: Secondary | ICD-10-CM

## 2015-07-20 DIAGNOSIS — O26612 Liver and biliary tract disorders in pregnancy, second trimester: Secondary | ICD-10-CM

## 2015-07-20 DIAGNOSIS — Z369 Encounter for antenatal screening, unspecified: Secondary | ICD-10-CM

## 2015-07-20 DIAGNOSIS — Z1389 Encounter for screening for other disorder: Secondary | ICD-10-CM

## 2015-07-20 DIAGNOSIS — O09892 Supervision of other high risk pregnancies, second trimester: Secondary | ICD-10-CM

## 2015-07-20 DIAGNOSIS — K831 Obstruction of bile duct: Secondary | ICD-10-CM | POA: Diagnosis not present

## 2015-07-20 LAB — POCT URINALYSIS DIPSTICK
GLUCOSE UA: NEGATIVE
KETONES UA: NEGATIVE
Leukocytes, UA: NEGATIVE
NITRITE UA: NEGATIVE
Protein, UA: NEGATIVE
RBC UA: NEGATIVE

## 2015-07-20 NOTE — Progress Notes (Signed)
[place] 27 wks,cephalic,cx 4.9 cm,ant pl gr 1,svp of fluid 6.6 cm,bilat adnexa's wnl,efw 1053 g 55%,fhr 142 bpm

## 2015-07-20 NOTE — Progress Notes (Signed)
Fetal Surveillance Testing today:  Sonogram EFW 55%  High Risk Pregnancy Diagnosis(es):   ICP  G2P1002 3961w0d Estimated Date of Delivery: 10/19/15  Blood pressure 110/70, pulse 88, weight 271 lb (122.925 kg), last menstrual period 11/26/2014.  Urinalysis: Negative   HPI: The patient is being seen today for ongoing management of ICP. Today she reports no itching currently   BP weight and urine results all reviewed and noted. Patient reports good fetal movement, denies any bleeding and no rupture of membranes symptoms or regular contractions.  Fundal Height:  30 Fetal Heart rate:  145 Edema:  1+  Patient is without complaints other than noted in her HPI. All questions were answered.  All lab and sonogram results have been reviewed. Comments: abnormal elevated bile acids  Assessment:  1.  Pregnancy at 1461w0d,  Estimated Date of Delivery: 10/19/15 :                          2.  ICP                        3.    Medication(s) Plans:  None currently  Treatment Plan:  Sonogram at 32 weeks also with twice weekly testing at 32 weeks, bile acids in 2 weeks  No Follow-up on file. for appointment for high risk OB care  No orders of the defined types were placed in this encounter.   Orders Placed This Encounter  Procedures  . POCT urinalysis dipstick

## 2015-07-21 LAB — CBC
HEMATOCRIT: 37.2 % (ref 34.0–46.6)
HEMOGLOBIN: 12.3 g/dL (ref 11.1–15.9)
MCH: 29.3 pg (ref 26.6–33.0)
MCHC: 33.1 g/dL (ref 31.5–35.7)
MCV: 89 fL (ref 79–97)
Platelets: 299 10*3/uL (ref 150–379)
RBC: 4.2 x10E6/uL (ref 3.77–5.28)
RDW: 13.8 % (ref 12.3–15.4)
WBC: 8.5 10*3/uL (ref 3.4–10.8)

## 2015-07-21 LAB — RPR: RPR Ser Ql: NONREACTIVE

## 2015-07-21 LAB — GLUCOSE TOLERANCE, 2 HOURS W/ 1HR
GLUCOSE, 1 HOUR: 87 mg/dL (ref 65–179)
Glucose, 2 hour: 84 mg/dL (ref 65–152)
Glucose, Fasting: 76 mg/dL (ref 65–91)

## 2015-07-21 LAB — HIV ANTIBODY (ROUTINE TESTING W REFLEX): HIV SCREEN 4TH GENERATION: NONREACTIVE

## 2015-07-21 LAB — ANTIBODY SCREEN: Antibody Screen: NEGATIVE

## 2015-07-26 ENCOUNTER — Telehealth: Payer: Self-pay | Admitting: *Deleted

## 2015-07-26 NOTE — Telephone Encounter (Signed)
Pt informed Glucose Tolerance test from 07/20/2015 WNL.

## 2015-07-31 ENCOUNTER — Ambulatory Visit (INDEPENDENT_AMBULATORY_CARE_PROVIDER_SITE_OTHER): Payer: PRIVATE HEALTH INSURANCE | Admitting: Women's Health

## 2015-07-31 ENCOUNTER — Encounter: Payer: Self-pay | Admitting: Women's Health

## 2015-07-31 VITALS — BP 104/66 | HR 88 | Wt 271.0 lb

## 2015-07-31 DIAGNOSIS — Z029 Encounter for administrative examinations, unspecified: Secondary | ICD-10-CM

## 2015-07-31 DIAGNOSIS — Z3A29 29 weeks gestation of pregnancy: Secondary | ICD-10-CM

## 2015-07-31 DIAGNOSIS — O26613 Liver and biliary tract disorders in pregnancy, third trimester: Secondary | ICD-10-CM

## 2015-07-31 DIAGNOSIS — R350 Frequency of micturition: Secondary | ICD-10-CM

## 2015-07-31 DIAGNOSIS — O09893 Supervision of other high risk pregnancies, third trimester: Secondary | ICD-10-CM

## 2015-07-31 DIAGNOSIS — O368130 Decreased fetal movements, third trimester, not applicable or unspecified: Secondary | ICD-10-CM | POA: Diagnosis not present

## 2015-07-31 DIAGNOSIS — K831 Obstruction of bile duct: Secondary | ICD-10-CM

## 2015-07-31 DIAGNOSIS — Z1389 Encounter for screening for other disorder: Secondary | ICD-10-CM

## 2015-07-31 DIAGNOSIS — Z331 Pregnant state, incidental: Secondary | ICD-10-CM

## 2015-07-31 NOTE — Progress Notes (Signed)
Work-in High Risk Pregnancy Diagnosis(es): ICP G2P1002 7337w4d Estimated Date of Delivery: 10/19/15 BP 104/66 mmHg  Pulse 88  Wt 271 lb (122.925 kg)  LMP 11/26/2014 (Approximate)  Urinalysis: Negative HPI:  Only felt baby move twice yesterday, none today. FOB states he's seen baby/belly moving- even at these times pt didn't feel fm BP, weight, and urine reviewed.  Denies regular uc's, lof, vb. Some urinary frequency, urine dip neg- will send cx.    Fundal Height:  34, efw 55%/svp 6.6 last week Fetal Heart rate:  145, reactive/reassuring for GA, fetal movement felt & heard during NST- none perceived by pt Edema:  none NST: reassuring for GA, audible fm- still not perceived by pt  Reviewed ptl s/s, fkc- gave printed info, Recommended Tdap at HD/PCP per CDC guidelines.  All questions were answered Assessment: 7637w4d ICP Medication(s) Plans:  None at present Treatment Plan:  Has appt Thurs to recheck bile acis/visit w/ LHE, U/S @ dx then q 3wks     Weekly BPP @ 28wks then 2x/wk testing @ 32wks   Deliver PRN or 37wks Follow up in 3d as scheduled for high-risk OB appt/fasting bile acis, add bpp

## 2015-07-31 NOTE — Patient Instructions (Signed)
Call the office ([phone]) or go to [facility] if:  You begin to have strong, frequent contractions  Your water breaks.  Sometimes it is a big gush of fluid, sometimes it is just a trickle that keeps getting your panties wet or running down your legs  You have vaginal bleeding.  It is normal to have a small amount of spotting if your cervix was checked.   You don't feel your baby moving like normal.  If you don't, get you something to eat and drink and lay down and focus on feeling your baby move.  You should feel at least 10 movements in 2 hours.  If you don't, you should call the office or go to [facility].    Tdap Vaccine  It is recommended that you get the Tdap vaccine during the third trimester of EACH pregnancy to help protect your baby from getting pertussis (whooping cough)  27-36 weeks is the BEST time to do this so that you can pass the protection on to your baby. During pregnancy is better than after pregnancy, but if you are unable to get it during pregnancy it will be offered at the hospital.   You can get this vaccine at the health department or your family doctor  Everyone who will be around your baby should also be up-to-date on their vaccines. Adults (who are not pregnant) only need 1 dose of Tdap during adulthood.   Third Trimester of Pregnancy The third trimester is from week 29 through week 42, months 7 through 9. The third trimester is a time when the fetus is growing rapidly. At the end of the ninth month, the fetus is about 20 inches in length and weighs 6-10 pounds.  BODY CHANGES Your body goes through many changes during pregnancy. The changes vary from woman to woman.   Your weight will continue to increase. You can expect to gain 25-35 pounds (11-16 kg) by the end of the pregnancy.  You may begin to get stretch marks on your hips, abdomen, and breasts.  You may urinate more often because the fetus is moving lower into your pelvis and pressing on  your bladder.  You may develop or continue to have heartburn as a result of your pregnancy.  You may develop constipation because certain hormones are causing the muscles that push waste through your intestines to slow down.  You may develop hemorrhoids or swollen, bulging veins (varicose veins).  You may have pelvic pain because of the weight gain and pregnancy hormones relaxing your joints between the bones in your pelvis. Backaches may result from overexertion of the muscles supporting your posture.  You may have changes in your hair. These can include thickening of your hair, rapid growth, and changes in texture. Some women also have hair loss during or after pregnancy, or hair that feels dry or thin. Your hair will most likely return to normal after your baby is born.  Your breasts will continue to grow and be tender. A yellow discharge may leak from your breasts called colostrum.  Your belly button may stick out.  You may feel short of breath because of your expanding uterus.  You may notice the fetus "dropping," or moving lower in your abdomen.  You may have a bloody mucus discharge. This usually occurs a few days to a week before labor begins.  Your cervix becomes thin and soft (effaced) near your due date. WHAT TO EXPECT AT YOUR PRENATAL EXAMS  You will have prenatal   exams every 2 weeks until week 36. Then, you will have weekly prenatal exams. During a routine prenatal visit: 2. You will be weighed to make sure you and the fetus are growing normally. 3. Your blood pressure is taken. 4. Your abdomen will be measured to track your baby's growth. 5. The fetal heartbeat will be listened to. 6. Any test results from the previous visit will be discussed. 7. You may have a cervical check near your due date to see if you have effaced. At around 36 weeks, your caregiver will check your cervix. At the same time, your caregiver will also perform a test on the secretions of the vaginal  tissue. This test is to determine if a type of bacteria, Group B streptococcus, is present. Your caregiver will explain this further. Your caregiver may ask you:  What your birth plan is.  How you are feeling.  If you are feeling the baby move.  If you have had any abnormal symptoms, such as leaking fluid, bleeding, severe headaches, or abdominal cramping.  If you have any questions. Other tests or screenings that may be performed during your third trimester include:  Blood tests that check for low iron levels (anemia).  Fetal testing to check the health, activity level, and growth of the fetus. Testing is done if you have certain medical conditions or if there are problems during the pregnancy. FALSE LABOR You may feel small, irregular contractions that eventually go away. These are called Braxton Hicks contractions, or false labor. Contractions may last for hours, days, or even weeks before true labor sets in. If contractions come at regular intervals, intensify, or become painful, it is best to be seen by your caregiver.  SIGNS OF LABOR   Menstrual-like cramps.  Contractions that are 5 minutes apart or less.  Contractions that start on the top of the uterus and spread down to the lower abdomen and back.  A sense of increased pelvic pressure or back pain.  A watery or bloody mucus discharge that comes from the vagina. If you have any of these signs before the 37th week of pregnancy, call your caregiver right away. You need to go to the hospital to get checked immediately. HOME CARE INSTRUCTIONS   Avoid all smoking, herbs, alcohol, and unprescribed drugs. These chemicals affect the formation and growth of the baby.  Follow your caregiver's instructions regarding medicine use. There are medicines that are either safe or unsafe to take during pregnancy.  Exercise only as directed by your caregiver. Experiencing uterine cramps is a good sign to stop exercising.  Continue to eat  regular, healthy meals.  Wear a good support bra for breast tenderness.  Do not use hot tubs, steam rooms, or saunas.  Wear your seat belt at all times when driving.  Avoid raw meat, uncooked cheese, cat litter boxes, and soil used by cats. These carry germs that can cause birth defects in the baby.  Take your prenatal vitamins.  Try taking a stool softener (if your caregiver approves) if you develop constipation. Eat more high-fiber foods, such as fresh vegetables or fruit and whole grains. Drink plenty of fluids to keep your urine clear or pale yellow.  Take warm sitz baths to soothe any pain or discomfort caused by hemorrhoids. Use hemorrhoid cream if your caregiver approves.  If you develop varicose veins, wear support hose. Elevate your feet for 15 minutes, 3-4 times a day. Limit salt in your diet.  Avoid heavy lifting, wear low heal  shoes, and practice good posture.  Rest a lot with your legs elevated if you have leg cramps or low back pain.  Visit your dentist if you have not gone during your pregnancy. Use a soft toothbrush to brush your teeth and be gentle when you floss.  A sexual relationship may be continued unless your caregiver directs you otherwise.  Do not travel far distances unless it is absolutely necessary and only with the approval of your caregiver.  Take prenatal classes to understand, practice, and ask questions about the labor and delivery.  Make a trial run to the hospital.  Pack your hospital bag.  Prepare the baby's nursery.  Continue to go to all your prenatal visits as directed by your caregiver. SEEK MEDICAL CARE IF:  You are unsure if you are in labor or if your water has broken.  You have dizziness.  You have mild pelvic cramps, pelvic pressure, or nagging pain in your abdominal area.  You have persistent nausea, vomiting, or diarrhea.  You have a bad smelling vaginal discharge.  You have pain with urination. SEEK IMMEDIATE MEDICAL  CARE IF:   You have a fever.  You are leaking fluid from your vagina.  You have spotting or bleeding from your vagina.  You have severe abdominal cramping or pain.  You have rapid weight loss or gain.  You have shortness of breath with chest pain.  You notice sudden or extreme swelling of your face, hands, ankles, feet, or legs.  You have not felt your baby move in over an hour.  You have severe headaches that do not go away with medicine.  You have vision changes. Document Released: 01/22/2001 Document Revised: 02/02/2013 Document Reviewed: 03/31/2012 [facility] Patient Information 2015 [place], [place]. This information is not intended to replace advice given to you by your health care provider. Make sure you discuss any questions you have with your health care provider.  Fetal Movement Counts Patient Name: __________________________________________________ Patient Due Date: ____________________ Performing a fetal movement count is highly recommended in high-risk pregnancies, but it is good for every pregnant woman to do. Your health care provider may ask you to start counting fetal movements at 28 weeks of the pregnancy. Fetal movements often increase:  After eating a full meal.  After physical activity.  After eating or drinking something sweet or cold.  At rest. Pay attention to when you feel the baby is most active. This will help you notice a pattern of your baby's sleep and wake cycles and what factors contribute to an increase in fetal movement. It is important to perform a fetal movement count at the same time each day when your baby is normally most active.  HOW TO COUNT FETAL MOVEMENTS 8. Find a quiet and comfortable area to sit or lie down on your left side. Lying on your left side provides the best blood and oxygen circulation to your baby. 9. Write down the day and time on a sheet of paper or in a journal. 10. Start counting kicks, flutters, swishes, rolls, or jabs  in a 2-hour period. You should feel at least 10 movements within 2 hours. 11. If you do not feel 10 movements in 2 hours, wait 2-3 hours and count again. Look for a change in the pattern or not enough counts in 2 hours. SEEK MEDICAL CARE IF:  You feel less than 10 counts in 2 hours, tried twice.  There is no movement in over an hour.  The pattern is changing  or taking longer each day to reach 10 counts in 2 hours.  You feel the baby is not moving as he or she usually does. Date: ____________ Movements: ____________ Start time: ____________ [name] time: ____________  Date: ____________ Movements: ____________ Start time: ____________ [name] time: ____________ Date: ____________ Movements: ____________ Start time: ____________ [name] time: ____________ Date: ____________ Movements: ____________ Start time: ____________ [name] time: ____________ Date: ____________ Movements: ____________ Start time: ____________ [name] time: ____________ Date: ____________ Movements: ____________ Start time: ____________ [name] time: ____________ Date: ____________ Movements: ____________ Start time: ____________ [name] time: ____________ Date: ____________ Movements: ____________ Start time: ____________ [name] time: ____________  Date: ____________ Movements: ____________ Start time: ____________ [name] time: ____________ Date: ____________ Movements: ____________ Start time: ____________ [name] time: ____________ Date: ____________ Movements: ____________ Start time: ____________ [name] time: ____________ Date: ____________ Movements: ____________ Start time: ____________ [name] time: ____________ Date: ____________ Movements: ____________ Start time: ____________ [name] time: ____________ Date: ____________ Movements: ____________ Start time: ____________ [name] time: ____________ Date: ____________ Movements: ____________ Start time: ____________ [name] time: ____________  Date: ____________ Movements:  ____________ Start time: ____________ [name] time: ____________ Date: ____________ Movements: ____________ Start time: ____________ [name] time: ____________ Date: ____________ Movements: ____________ Start time: ____________ [name] time: ____________ Date: ____________ Movements: ____________ Start time: ____________ [name] time: ____________ Date: ____________ Movements: ____________ Start time: ____________ [name] time: ____________ Date: ____________ Movements: ____________ Start time: ____________ [name] time: ____________ Date: ____________ Movements: ____________ Start time: ____________ [name] time: ____________  Date: ____________ Movements: ____________ Start time: ____________ [name] time: ____________ Date: ____________ Movements: ____________ Start time: ____________ [name] time: ____________ Date: ____________ Movements: ____________ Start time: ____________ [name] time: ____________ Date: ____________ Movements: ____________ Start time: ____________ [name] time: ____________ Date: ____________ Movements: ____________ Start time: ____________ [name] time: ____________ Date: ____________ Movements: ____________ Start time: ____________ [name] time: ____________ Date: ____________ Movements: ____________ Start time: ____________ [name] time: ____________  Date: ____________ Movements: ____________ Start time: ____________ [name] time: ____________ Date: ____________ Movements: ____________ Start time: ____________ [name] time: ____________ Date: ____________ Movements: ____________ Start time: ____________ [name] time: ____________ Date: ____________ Movements: ____________ Start time: ____________ [name] time: ____________ Date: ____________ Movements: ____________ Start time: ____________ [name] time: ____________ Date: ____________ Movements: ____________ Start time: ____________ [name] time: ____________ Date: ____________ Movements: ____________ Start time: ____________ [name]  time: ____________  Date: ____________ Movements: ____________ Start time: ____________ [name] time: ____________ Date: ____________ Movements: ____________ Start time: ____________ [name] time: ____________ Date: ____________ Movements: ____________ Start time: ____________ [name] time: ____________ Date: ____________ Movements: ____________ Start time: ____________ [name] time: ____________ Date: ____________ Movements: ____________ Start time: ____________ [name] time: ____________ Date: ____________ Movements: ____________ Start time: ____________ [name] time: ____________ Date: ____________ Movements: ____________ Start time: ____________ [name] time: ____________  Date: ____________ Movements: ____________ Start time: ____________ [name] time: ____________ Date: ____________ Movements: ____________ Start time: ____________ [name] time: ____________ Date: ____________ Movements: ____________ Start time: ____________ [name] time: ____________ Date: ____________ Movements: ____________ Start time: ____________ [name] time: ____________ Date: ____________ Movements: ____________ Start time: ____________ [name] time: ____________ Date: ____________ Movements: ____________ Start time: ____________ [name] time: ____________ Date: ____________ Movements: ____________ Start time: ____________ [name] time: ____________  Date: ____________ Movements: ____________ Start time: ____________ [name] time: ____________ Date: ____________ Movements: ____________ Start time: ____________ [name] time: ____________ Date: ____________ Movements: ____________ Start time: ____________ [name] time: ____________ Date: ____________ Movements: ____________ Start time: ____________ [name] time: ____________ Date: ____________ Movements: ____________ Start time: ____________ [name] time: ____________ Date: ____________ Movements: ____________ Start time: ____________ [name] time: ____________   This information  is not  intended to replace advice given to you by your health care provider. Make sure you discuss any questions you have with your health care provider.   Document Released: 02/27/2006 Document Revised: 02/18/2014 Document Reviewed: 11/25/2011 [employer] Interactive Patient Education [employer]

## 2015-08-01 LAB — URINE CULTURE

## 2015-08-03 ENCOUNTER — Encounter: Payer: Self-pay | Admitting: Obstetrics & Gynecology

## 2015-08-03 ENCOUNTER — Ambulatory Visit (INDEPENDENT_AMBULATORY_CARE_PROVIDER_SITE_OTHER): Payer: PRIVATE HEALTH INSURANCE

## 2015-08-03 ENCOUNTER — Other Ambulatory Visit: Payer: PRIVATE HEALTH INSURANCE

## 2015-08-03 ENCOUNTER — Ambulatory Visit (INDEPENDENT_AMBULATORY_CARE_PROVIDER_SITE_OTHER): Payer: PRIVATE HEALTH INSURANCE | Admitting: Obstetrics & Gynecology

## 2015-08-03 VITALS — BP 90/60 | HR 80 | Wt 272.0 lb

## 2015-08-03 DIAGNOSIS — Z1389 Encounter for screening for other disorder: Secondary | ICD-10-CM

## 2015-08-03 DIAGNOSIS — O26613 Liver and biliary tract disorders in pregnancy, third trimester: Secondary | ICD-10-CM | POA: Diagnosis not present

## 2015-08-03 DIAGNOSIS — Z1329 Encounter for screening for other suspected endocrine disorder: Secondary | ICD-10-CM

## 2015-08-03 DIAGNOSIS — K831 Obstruction of bile duct: Secondary | ICD-10-CM

## 2015-08-03 DIAGNOSIS — Z331 Pregnant state, incidental: Secondary | ICD-10-CM

## 2015-08-03 DIAGNOSIS — O26619 Liver and biliary tract disorders in pregnancy, unspecified trimester: Principal | ICD-10-CM

## 2015-08-03 DIAGNOSIS — O09892 Supervision of other high risk pregnancies, second trimester: Secondary | ICD-10-CM

## 2015-08-03 DIAGNOSIS — O09893 Supervision of other high risk pregnancies, third trimester: Secondary | ICD-10-CM

## 2015-08-03 DIAGNOSIS — Z3A29 29 weeks gestation of pregnancy: Secondary | ICD-10-CM | POA: Diagnosis not present

## 2015-08-03 LAB — POCT URINALYSIS DIPSTICK
Blood, UA: NEGATIVE
GLUCOSE UA: NEGATIVE
KETONES UA: NEGATIVE
LEUKOCYTES UA: NEGATIVE
NITRITE UA: NEGATIVE

## 2015-08-03 NOTE — Progress Notes (Signed)
US 29 wks,trans head lt,BPP 8/8,bilat adnexa's wnl,ant pl gr 1,cx 5.4 cm,fhr 141 bpm,afi 14.3 cm

## 2015-08-03 NOTE — Progress Notes (Signed)
Fetal Surveillance Testing today:  BPP 8/8   High Risk Pregnancy Diagnosis(es):   ICP  G2P1002 7026w0d Estimated Date of Delivery: 10/19/15  Blood pressure 90/60, pulse 80, weight 272 lb (123.378 kg), last menstrual period 11/26/2014.  Urinalysis: Negative   HPI: The patient is being seen today for ongoing management of ICP. Today she reports minimal itching, labs repeated today   BP weight and urine results all reviewed and noted. Patient reports good fetal movement, denies any bleeding and no rupture of membranes symptoms or regular contractions.  Fundal Height:  36 Fetal Heart rate:  141 Edema:  none  Patient is without complaints other than noted in her HPI. All questions were answered.  All lab and sonogram results have been reviewed. Comments:    Assessment:  1.  Pregnancy at 3726w0d,  Estimated Date of Delivery: 10/19/15 :                          2.  ICP                        3.    Medication(s) Plans:  synthroid  Treatment Plan:  Weekly BPP til 32 weeks then twice weekly evals  No Follow-up on file. for appointment for high risk OB care  No orders of the defined types were placed in this encounter.   Orders Placed This Encounter  Procedures  . POCT urinalysis dipstick

## 2015-08-04 LAB — BILE ACIDS, TOTAL: BILE ACIDS TOTAL: 6.5 umol/L (ref 4.7–24.5)

## 2015-08-04 LAB — TSH: TSH: 4.97 u[IU]/mL — AB (ref 0.450–4.500)

## 2015-08-07 ENCOUNTER — Telehealth: Payer: Self-pay | Admitting: *Deleted

## 2015-08-07 NOTE — Telephone Encounter (Signed)
Pt informed of Bile acid results from 08/03/2015 of 6.5, WNL. Pt verbalized understanding.

## 2015-08-17 ENCOUNTER — Encounter: Payer: Self-pay | Admitting: Obstetrics & Gynecology

## 2015-08-17 ENCOUNTER — Ambulatory Visit (INDEPENDENT_AMBULATORY_CARE_PROVIDER_SITE_OTHER): Payer: PRIVATE HEALTH INSURANCE | Admitting: Obstetrics & Gynecology

## 2015-08-17 ENCOUNTER — Ambulatory Visit (INDEPENDENT_AMBULATORY_CARE_PROVIDER_SITE_OTHER): Payer: PRIVATE HEALTH INSURANCE

## 2015-08-17 VITALS — BP 90/60 | HR 80 | Wt 276.0 lb

## 2015-08-17 DIAGNOSIS — O26613 Liver and biliary tract disorders in pregnancy, third trimester: Secondary | ICD-10-CM

## 2015-08-17 DIAGNOSIS — Z331 Pregnant state, incidental: Secondary | ICD-10-CM

## 2015-08-17 DIAGNOSIS — O09892 Supervision of other high risk pregnancies, second trimester: Secondary | ICD-10-CM

## 2015-08-17 DIAGNOSIS — K831 Obstruction of bile duct: Secondary | ICD-10-CM

## 2015-08-17 DIAGNOSIS — Z1389 Encounter for screening for other disorder: Secondary | ICD-10-CM

## 2015-08-17 DIAGNOSIS — O09893 Supervision of other high risk pregnancies, third trimester: Secondary | ICD-10-CM

## 2015-08-17 DIAGNOSIS — Z3A31 31 weeks gestation of pregnancy: Secondary | ICD-10-CM

## 2015-08-17 LAB — POCT URINALYSIS DIPSTICK
Glucose, UA: NEGATIVE
KETONES UA: NEGATIVE
Leukocytes, UA: NEGATIVE
Nitrite, UA: NEGATIVE
PROTEIN UA: NEGATIVE
RBC UA: NEGATIVE

## 2015-08-17 MED ORDER — THYROID 81.25 MG PO TABS: 65.0000 mg | ORAL_TABLET | Freq: Every day | ORAL | Status: DC

## 2015-08-17 NOTE — Progress Notes (Signed)
US 31 wks,cephalic,BPP 8/8,FHR 143 bpm,ant pl gr 1,cx 4.8 cm,afi 19.8 cm

## 2015-08-17 NOTE — Progress Notes (Signed)
Fetal Surveillance Testing today:  BPP 8/8 sonogram normal   High Risk Pregnancy Diagnosis(es):   ICP, resolved  G2P1002 4973w0d Estimated Date of Delivery: 10/19/15  Blood pressure 90/60, pulse 80, weight 276 lb (125.193 kg), last menstrual period 11/26/2014.  Urinalysis: Negative   HPI: The patient is being seen today for ongoing management of ICP, spontaneous resolution. Today she reports tired, back ache pelvic ache   BP weight and urine results all reviewed and noted. Patient reports good fetal movement, denies any bleeding and no rupture of membranes symptoms or regular contractions.  Fundal Height:  36 Fetal Heart rate:  143 Edema:  1+  Patient is without complaints other than noted in her HPI. All questions were answered.  All lab and sonogram results have been reviewed. Comments: abnormal   Assessment:  1.  Pregnancy at 4473w0d,  Estimated Date of Delivery: 10/19/15 :                          2.  ICP now with spontaneous normal bile acids                        3.    Medication(s) Plans:  None for now  Treatment Plan:  Debating management here out, will research spontaneous normalization of bile acids in pregnancy, probably does not exist  No Follow-up on file. for appointment for high risk OB care  Meds ordered this encounter  Medications  . thyroid 81.25 MG TABS    Sig: Take 65 mg by mouth daily.    Dispense:  30 tablet    Refill:  1   Orders Placed This Encounter  Procedures  . POCT urinalysis dipstick

## 2015-08-31 ENCOUNTER — Ambulatory Visit (INDEPENDENT_AMBULATORY_CARE_PROVIDER_SITE_OTHER): Payer: PRIVATE HEALTH INSURANCE | Admitting: Obstetrics & Gynecology

## 2015-08-31 ENCOUNTER — Encounter: Payer: Self-pay | Admitting: Obstetrics & Gynecology

## 2015-08-31 VITALS — BP 84/60 | HR 80 | Wt 273.0 lb

## 2015-08-31 DIAGNOSIS — Z331 Pregnant state, incidental: Secondary | ICD-10-CM

## 2015-08-31 DIAGNOSIS — Z3A33 33 weeks gestation of pregnancy: Secondary | ICD-10-CM

## 2015-08-31 DIAGNOSIS — Z1389 Encounter for screening for other disorder: Secondary | ICD-10-CM

## 2015-08-31 DIAGNOSIS — O09893 Supervision of other high risk pregnancies, third trimester: Secondary | ICD-10-CM

## 2015-08-31 LAB — POCT URINALYSIS DIPSTICK
Blood, UA: NEGATIVE
Glucose, UA: NEGATIVE
Ketones, UA: NEGATIVE
LEUKOCYTES UA: NEGATIVE
NITRITE UA: NEGATIVE
PROTEIN UA: NEGATIVE

## 2015-08-31 NOTE — Progress Notes (Signed)
Fetal Surveillance Testing today:  FHR   High Risk Pregnancy Diagnosis(es):   Resolved ICP, spontaneously  G2P1002 232w0d Estimated Date of Delivery: 10/19/15  Blood pressure 84/60, pulse 80, weight 273 lb (123.832 kg), last menstrual period 11/26/2014.  Urinalysis: Negative   HPI: The patient is being seen today for ongoing management of ICP which resolved. Today she reports no itching, back and pelvic pressure   BP weight and urine results all reviewed and noted. Patient reports good fetal movement, denies any bleeding and no rupture of membranes symptoms or regular contractions.  Fundal Height:  38 Fetal Heart rate:  163 Edema:  1+  Patient is without complaints other than noted in her HPI. All questions were answered.  All lab and sonogram results have been reviewed. Comments: normal   Assessment:  1.  Pregnancy at 882w0d,  Estimated Date of Delivery: 10/19/15 :                          2.  Resolved ICP                        3.    Medication(s) Plans:  none  Treatment Plan:  Recheck bile acids 2 weeks, sonogram at 36 weeks  No Follow-up on file. for appointment for high risk OB care  No orders of the defined types were placed in this encounter.   Orders Placed This Encounter  Procedures  . POCT urinalysis dipstick

## 2015-09-12 ENCOUNTER — Encounter (HOSPITAL_COMMUNITY): Payer: Self-pay | Admitting: *Deleted

## 2015-09-12 ENCOUNTER — Inpatient Hospital Stay (HOSPITAL_COMMUNITY)
Admission: AD | Admit: 2015-09-12 | Discharge: 2015-09-12 | Disposition: A | Discharge status: Home / Self Care | Payer: PRIVATE HEALTH INSURANCE | Source: Ambulatory Visit | Attending: Obstetrics & Gynecology | Admitting: Obstetrics & Gynecology

## 2015-09-12 DIAGNOSIS — E039 Hypothyroidism, unspecified: Secondary | ICD-10-CM | POA: Diagnosis not present

## 2015-09-12 DIAGNOSIS — Z3A34 34 weeks gestation of pregnancy: Secondary | ICD-10-CM | POA: Diagnosis not present

## 2015-09-12 DIAGNOSIS — O99283 Endocrine, nutritional and metabolic diseases complicating pregnancy, third trimester: Secondary | ICD-10-CM | POA: Diagnosis not present

## 2015-09-12 DIAGNOSIS — R109 Unspecified abdominal pain: Secondary | ICD-10-CM | POA: Diagnosis present

## 2015-09-12 DIAGNOSIS — Z87891 Personal history of nicotine dependence: Secondary | ICD-10-CM | POA: Insufficient documentation

## 2015-09-12 DIAGNOSIS — O26893 Other specified pregnancy related conditions, third trimester: Secondary | ICD-10-CM | POA: Diagnosis not present

## 2015-09-12 LAB — URINE MICROSCOPIC-ADD ON

## 2015-09-12 LAB — URINALYSIS, ROUTINE W REFLEX MICROSCOPIC
BILIRUBIN URINE: NEGATIVE
Glucose, UA: NEGATIVE mg/dL
KETONES UR: NEGATIVE mg/dL
NITRITE: NEGATIVE
PH: 6 (ref 5.0–8.0)
Protein, ur: NEGATIVE mg/dL
Specific Gravity, Urine: 1.025 (ref 1.005–1.030)

## 2015-09-12 LAB — WET PREP, GENITAL
Clue Cells Wet Prep HPF POC: NONE SEEN
Sperm: NONE SEEN
Trich, Wet Prep: NONE SEEN
YEAST WET PREP: NONE SEEN

## 2015-09-12 MED ORDER — LACTATED RINGERS IV BOLUS (SEPSIS)
1000.0000 mL | Freq: Once | INTRAVENOUS | Status: AC
  Administered 2015-09-12: 1000 mL via INTRAVENOUS

## 2015-09-12 NOTE — MAU Note (Signed)
Patient states she started having cramping last night in her abdomen and back that has been constant.  Denies LOF or vaginal bleeding.  Reports +fetal movement.

## 2015-09-12 NOTE — MAU Provider Note (Signed)
MAU HISTORY AND PHYSICAL  Chief Complaint:  Abdominal Cramping   [name] is a 30 y.o.  G2P1002  at [redacted]w[redacted]d presenting for Abdominal Cramping . Pt has a history of primary section for twin gestation, and she is not sure what contractions feel like. She reports feeling generally uncomfortable since 1am on 8/1. Subjective abdominal cramping off and on, irregularly. Patient states she has been having none vaginal bleeding, intact membranes, with active fetal movement.    Past Medical History:  Diagnosis Date  . Breast nodule 10/10/2014  . Breast pain 10/10/2014  . BV (bacterial vaginosis) 11/12/2012  . Fatty liver   . Hypothyroid 02/14/2015  . Irregular menses 05/21/2013  . Migraines   . Obesity   . Other and unspecified ovarian cyst 05/31/2013   Right complex ?cystic vs solid mass on ovary will schedule appt with [facility]  . Pregnant 02/14/2015  . Thyroid disease     Past Surgical History:  Procedure Laterality Date  . CESAREAN SECTION    . COLONOSCOPY    . OOPHORECTOMY    . TONSILLECTOMY    . WISDOM TOOTH EXTRACTION      Family History  Problem Relation Age of Onset  . Diabetes Maternal Grandmother   . Hypertension Maternal Grandmother   . Cancer Maternal Grandmother     breast  . Diabetes Maternal Grandfather   . Hypertension Maternal Grandfather   . Leukemia Maternal Grandfather   . Diabetes Mother   . Hypertension Mother   . Heart attack Mother   . Stroke Mother   . Kidney disease Paternal Grandfather   . Seizures Father   . Other Father     back problems  . Dementia Paternal Grandmother     Social History  Substance Use Topics  . Smoking status: Former Smoker    Packs/day: 0.25    Years: 8.00    Types: Cigarettes    Quit date: 10/07/2010  . Smokeless tobacco: Never Used  . Alcohol use No     Comment: occ.; not now    Allergies  Allergen Reactions  . Imitrex [Sumatriptan]     Makes migraines worse.   . Maxalt [Rizatriptan]     Makes migraines worse  .  Phenergan [Promethazine Hcl] Nausea And Vomiting  . Tape Other (See Comments)    Blisters on abdomen after C Section    No prescriptions prior to admission.    Review of Systems - Negative except for what is mentioned in HPI.  Physical Exam  Blood pressure 104/61, pulse 78, temperature 97.6 F (36.4 C), temperature source Oral, resp. rate 16, height  (1.575 m), last menstrual period 11/26/2014, SpO2 97 %. GENERAL: Well-developed, well-nourished female in no acute distress.  LUNGS: Clear to auscultation bilaterally.  HEART: Regular rate and rhythm. ABDOMEN: Soft, nontender, nondistended, gravid.  EXTREMITIES: Nontender, no edema, 2+ distal pulses. GU: white discharge present on speculum exam.  Presentation: cephalic FHT:  Cat 1 Contractions: irregular    Labs: Results for orders placed or performed during the hospital encounter of 09/12/15 (from the past 24 hour(s))  Urinalysis, Routine w reflex microscopic (not at [facility])   Collection Time: 09/12/15  8:56 AM  Result Value Ref Range   Color, Urine YELLOW YELLOW   APPearance CLEAR CLEAR   Specific Gravity, Urine 1.025 1.005 - 1.030   pH 6.0 5.0 - 8.0   Glucose, UA NEGATIVE NEGATIVE mg/dL   Hgb urine dipstick TRACE (A) NEGATIVE   Bilirubin Urine NEGATIVE NEGATIVE  Ketones, ur NEGATIVE NEGATIVE mg/dL   Protein, ur NEGATIVE NEGATIVE mg/dL   Nitrite NEGATIVE NEGATIVE   Leukocytes, UA TRACE (A) NEGATIVE  Urine microscopic-add on   Collection Time: 09/12/15  8:56 AM  Result Value Ref Range   Squamous Epithelial / LPF 6-30 (A) NONE SEEN   WBC, UA 6-30 0 - 5 WBC/hpf   RBC / HPF 6-30 0 - 5 RBC/hpf   Bacteria, UA MANY (A) NONE SEEN   Urine-Other MUCOUS PRESENT   Wet prep, genital   Collection Time: 09/12/15 10:30 AM  Result Value Ref Range   Yeast Wet Prep HPF POC NONE SEEN NONE SEEN   Trich, Wet Prep NONE SEEN NONE SEEN   Clue Cells Wet Prep HPF POC NONE SEEN NONE SEEN   WBC, Wet Prep HPF POC MODERATE (A) NONE SEEN    Sperm NONE SEEN     Imaging Studies:  US Fetal Bpp W/o Non Stress  Result Date: 08/17/2015 FOLLOW UP SONOGRAM [name] is in the office for a follow up sonogram for BPP. She is a 30 y.o. year old G64P1002 with Estimated Date of Delivery: 10/19/15 by early ultrasound now at  [redacted]w[redacted]d weeks gestation. Thus far the pregnancy has been complicated by cholestasis,hypothyroid. GESTATION: SINGLETON PRESENTATION: transverse FETAL ACTIVITY:          Heart rate         143          The fetus is active. AMNIOTIC FLUID: The amniotic fluid volume is  normal, 19.8 cm. PLACENTA LOCALIZATION:  anterior GRADE 1 CERVIX: Measures 4.8 cm ADNEXA: Rt oophorectomy,bilat adnexa wnl GESTATIONAL AGE AND  BIOMETRICS: Gestational criteria: Estimated Date of Delivery: 10/19/15 by early ultrasound now at [redacted]w[redacted]d Previous Scans:7 BIOPHYSCIAL PROFILE:                                                                                                      COMMENTS GROSS BODY MOVEMENT                 2  TONE                2  RESPIRATIONS                2  AMNIOTIC FLUID                2                                                          SCORE:  8/8 (Note: NST was not performed as part of this antepartum testing) ANATOMICAL SURVEY  COMMENTS CEREBRAL VENTRICLES    CHOROID PLEXUS    CEREBELLUM    CISTERNA MAGNA    NUCHAL REGION    ORBITS    NASAL BONE    NOSE/LIP    FACIAL PROFILE    4 CHAMBERED HEART    OUTFLOW TRACTS    DIAPHRAGM yes normal  STOMACH yes normal  RENAL REGION yes normal  BLADDER yes normal  CORD INSERTION    3 VESSEL CORD yes normal  SPINE    ARMS/HANDS    LEGS/FEET    GENITALIA yes normal female     SUSPECTED ABNORMALITIES:  no QUALITY OF SCAN: satisfactory TECHNICIAN COMMENTS: [place] 31 wks,cephalic,BPP 8/8,FHR 143 bpm,ant pl gr 1,cx 4.8 cm,afi 19.8 cm A copy of this report including all images has been saved and backed up to a second source for retrieval if  needed. All measures and details of the anatomical scan, placentation, fluid volume and pelvic anatomy are contained in that report. Amber Flora Lipps 08/17/2015 9:37 AM Clinical Impression and recommendations: I have reviewed the sonogram results above, combined with the patient's current clinical course, below are my impressions and any appropriate recommendations for management based on the sonographic findings. 1.  [id number] Estimated Date of Delivery: 10/19/15 by serial sonographic evaluations 2.  Fetal sonographic surveillance findings: a). Normal fluid volume b). Normal antepartum fetal assessment with BPP 8/8 c). Normal fetal Doppler ratios with consistent diastolic flow 3.  Normal general sonographic findings Recommend continued prenatal evaluations and care based on this sonogram and as clinically indicated from the patient's clinical course. Lazaro Arms 08/17/2015 9:56 AM    Assessment: [name] is  30 y.o. G2P1002 at [redacted]w[redacted]d presents with Abdominal Cramping .  Plan: Rule out labor: Not tracing significant contractions, pt felt better after 1 liter bolus. -negative pooling -moderate WBC on wet prep, many bacteria, neg leuks or nitrites, will send for culture and call if necessary -dispo home  Loni Muse 8/1/201712:42 PM   OB FELLOW MAU DISCHARGE ATTESTATION  I have seen and examined this patient; I agree with above documentation in the resident's note.    Jen Mow, DO 2:23 PM

## 2015-09-12 NOTE — Discharge Instructions (Signed)
Keep your next follow up appointment.  Preterm Labor Information Preterm labor is when labor starts at less than 37 weeks of pregnancy. The normal length of a pregnancy is 39 to 41 weeks. CAUSES Often, there is no identifiable underlying cause as to why a woman goes into preterm labor. One of the most common known causes of preterm labor is infection. Infections of the uterus, cervix, vagina, amniotic sac, bladder, kidney, or even the lungs (pneumonia) can cause labor to start. Other suspected causes of preterm labor include:   Urogenital infections, such as yeast infections and bacterial vaginosis.   Uterine abnormalities (uterine shape, uterine septum, fibroids, or bleeding from the placenta).   A cervix that has been operated on (it may fail to stay closed).   Malformations in the fetus.   Multiple gestations (twins, triplets, and so on).   Breakage of the amniotic sac.  RISK FACTORS  Having a previous history of preterm labor.   Having premature rupture of membranes (PROM).   Having a placenta that covers the opening of the cervix (placenta previa).   Having a placenta that separates from the uterus (placental abruption).   Having a cervix that is too weak to hold the fetus in the uterus (incompetent cervix).   Having too much fluid in the amniotic sac (polyhydramnios).   Taking illegal drugs or smoking while pregnant.   Not gaining enough weight while pregnant.   Being younger than 66 and older than 28 years old.   Having a low socioeconomic status.   Being African American. SYMPTOMS Signs and symptoms of preterm labor include:   Menstrual-like cramps, abdominal pain, or back pain.  Uterine contractions that are regular, as frequent as six in an hour, regardless of their intensity (may be mild or painful).  Contractions that start on the top of the uterus and spread down to the lower abdomen and back.   A sense of increased pelvic pressure.    A watery or bloody mucus discharge that comes from the vagina.  TREATMENT Depending on the length of the pregnancy and other circumstances, your health care provider may suggest bed rest. If necessary, there are medicines that can be given to stop contractions and to mature the fetal lungs. If labor happens before 34 weeks of pregnancy, a prolonged hospital stay may be recommended. Treatment depends on the condition of both you and the fetus.  WHAT SHOULD YOU DO IF YOU THINK YOU ARE IN PRETERM LABOR? Call your health care provider right away. You will need to go to the hospital to get checked immediately. HOW CAN YOU PREVENT PRETERM LABOR IN FUTURE PREGNANCIES? You should:   Stop smoking if you smoke.  Maintain healthy weight gain and avoid chemicals and drugs that are not necessary.  Be watchful for any type of infection.  Inform your health care provider if you have a known history of preterm labor.   This information is not intended to replace advice given to you by your health care provider. Make sure you discuss any questions you have with your health care provider.   Document Released: 04/20/2003 Document Revised: 09/30/2012 Document Reviewed: 03/02/2012 Elsevier Interactive Patient Education 2016 Elsevier Inc[employer] Fetal Movement Counts Patient Name: __________________________________________________ Patient Due Date: ____________________ Performing a fetal movement count is highly recommended in high-risk pregnancies, but it is good for every pregnant woman to do. Your health care provider may ask you to start counting fetal movements at 28 weeks of the pregnancy. Fetal movements often increase:  After eating a full meal.  After physical activity.  After eating or drinking something sweet or cold.  At rest. Pay attention to when you feel the baby is most active. This will help you notice a pattern of your baby's sleep and wake cycles and what factors contribute to an  increase in fetal movement. It is important to perform a fetal movement count at the same time each day when your baby is normally most active.  HOW TO COUNT FETAL MOVEMENTS 1. Find a quiet and comfortable area to sit or lie down on your left side. Lying on your left side provides the best blood and oxygen circulation to your baby. 2. Write down the day and time on a sheet of paper or in a journal. 3. Start counting kicks, flutters, swishes, rolls, or jabs in a 2-hour period. You should feel at least 10 movements within 2 hours. 4. If you do not feel 10 movements in 2 hours, wait 2-3 hours and count again. Look for a change in the pattern or not enough counts in 2 hours. SEEK MEDICAL CARE IF:  You feel less than 10 counts in 2 hours, tried twice.  There is no movement in over an hour.  The pattern is changing or taking longer each day to reach 10 counts in 2 hours.  You feel the baby is not moving as he or she usually does. Date: ____________ Movements: ____________ Start time: ____________ Doreatha Martin time: ____________  Date: ____________ Movements: ____________ Start time: ____________ Doreatha Martin time: ____________ Date: ____________ Movements: ____________ Start time: ____________ Doreatha Martin time: ____________ Date: ____________ Movements: ____________ Start time: ____________ Doreatha Martin time: ____________ Date: ____________ Movements: ____________ Start time: ____________ Doreatha Martin time: ____________ Date: ____________ Movements: ____________ Start time: ____________ Doreatha Martin time: ____________ Date: ____________ Movements: ____________ Start time: ____________ Doreatha Martin time: ____________ Date: ____________ Movements: ____________ Start time: ____________ Doreatha Martin time: ____________  Date: ____________ Movements: ____________ Start time: ____________ Doreatha Martin time: ____________ Date: ____________ Movements: ____________ Start time: ____________ Doreatha Martin time: ____________ Date: ____________ Movements: ____________  Start time: ____________ Doreatha Martin time: ____________ Date: ____________ Movements: ____________ Start time: ____________ Doreatha Martin time: ____________ Date: ____________ Movements: ____________ Start time: ____________ Doreatha Martin time: ____________ Date: ____________ Movements: ____________ Start time: ____________ Doreatha Martin time: ____________ Date: ____________ Movements: ____________ Start time: ____________ Doreatha Martin time: ____________  Date: ____________ Movements: ____________ Start time: ____________ Doreatha Martin time: ____________ Date: ____________ Movements: ____________ Start time: ____________ Doreatha Martin time: ____________ Date: ____________ Movements: ____________ Start time: ____________ Doreatha Martin time: ____________ Date: ____________ Movements: ____________ Start time: ____________ Doreatha Martin time: ____________ Date: ____________ Movements: ____________ Start time: ____________ Doreatha Martin time: ____________ Date: ____________ Movements: ____________ Start time: ____________ Doreatha Martin time: ____________ Date: ____________ Movements: ____________ Start time: ____________ Doreatha Martin time: ____________  Date: ____________ Movements: ____________ Start time: ____________ Doreatha Martin time: ____________ Date: ____________ Movements: ____________ Start time: ____________ Doreatha Martin time: ____________ Date: ____________ Movements: ____________ Start time: ____________ Doreatha Martin time: ____________ Date: ____________ Movements: ____________ Start time: ____________ Doreatha Martin time: ____________ Date: ____________ Movements: ____________ Start time: ____________ Doreatha Martin time: ____________ Date: ____________ Movements: ____________ Start time: ____________ Doreatha Martin time: ____________ Date: ____________ Movements: ____________ Start time: ____________ Doreatha Martin time: ____________  Date: ____________ Movements: ____________ Start time: ____________ Doreatha Martin time: ____________ Date: ____________ Movements: ____________ Start time: ____________ Doreatha Martin time:  ____________ Date: ____________ Movements: ____________ Start time: ____________ Doreatha Martin time: ____________ Date: ____________ Movements: ____________ Start time: ____________ Doreatha Martin time: ____________ Date: ____________ Movements: ____________ Start time: ____________ Doreatha Martin time: ____________ Date: ____________ Movements: ____________ Start time: ____________ Doreatha Martin time: ____________ Date:  ____________ Movements: ____________ Start time: ____________ Doreatha Martin time: ____________  Date: ____________ Movements: ____________ Start time: ____________ Doreatha Martin time: ____________ Date: ____________ Movements: ____________ Start time: ____________ Doreatha Martin time: ____________ Date: ____________ Movements: ____________ Start time: ____________ Doreatha Martin time: ____________ Date: ____________ Movements: ____________ Start time: ____________ Doreatha Martin time: ____________ Date: ____________ Movements: ____________ Start time: ____________ Doreatha Martin time: ____________ Date: ____________ Movements: ____________ Start time: ____________ Doreatha Martin time: ____________ Date: ____________ Movements: ____________ Start time: ____________ Doreatha Martin time: ____________  Date: ____________ Movements: ____________ Start time: ____________ Doreatha Martin time: ____________ Date: ____________ Movements: ____________ Start time: ____________ Doreatha Martin time: ____________ Date: ____________ Movements: ____________ Start time: ____________ Doreatha Martin time: ____________ Date: ____________ Movements: ____________ Start time: ____________ Doreatha Martin time: ____________ Date: ____________ Movements: ____________ Start time: ____________ Doreatha Martin time: ____________ Date: ____________ Movements: ____________ Start time: ____________ Doreatha Martin time: ____________ Date: ____________ Movements: ____________ Start time: ____________ Doreatha Martin time: ____________  Date: ____________ Movements: ____________ Start time: ____________ Doreatha Martin time: ____________ Date: ____________ Movements:  ____________ Start time: ____________ Doreatha Martin time: ____________ Date: ____________ Movements: ____________ Start time: ____________ Doreatha Martin time: ____________ Date: ____________ Movements: ____________ Start time: ____________ Doreatha Martin time: ____________ Date: ____________ Movements: ____________ Start time: ____________ Doreatha Martin time: ____________ Date: ____________ Movements: ____________ Start time: ____________ Doreatha Martin time: ____________   This information is not intended to replace advice given to you by your health care provider. Make sure you discuss any questions you have with your health care provider.   Document Released: 02/27/2006 Document Revised: 02/18/2014 Document Reviewed: 11/25/2011 Elsevier Interactive Patient Education 2016 Elsevier Inc[employer] of the uterus can occur throughout pregnancy. Contractions are not always a sign that you are in labor.  WHAT ARE BRAXTON HICKS CONTRACTIONS?  Contractions that occur before labor are called Braxton Hicks contractions, or false labor. Toward the end of pregnancy (32-34 weeks), these contractions can develop more often and may become more forceful. This is not true labor because these contractions do not result in opening (dilatation) and thinning of the cervix. They are sometimes difficult to tell apart from true labor because these contractions can be forceful and people have different pain tolerances. You should not feel embarrassed if you go to the hospital with false labor. Sometimes, the only way to tell if you are in true labor is for your health care provider to look for changes in the cervix. If there are no prenatal problems or other health problems associated with the pregnancy, it is completely safe to be sent home with false labor and await the onset of true labor. HOW CAN YOU TELL THE DIFFERENCE BETWEEN TRUE AND FALSE LABOR? False Labor  The contractions of false labor are usually shorter and not  as hard as those of true labor.   The contractions are usually irregular.   The contractions are often felt in the front of the lower abdomen and in the groin.   The contractions may go away when you walk around or change positions while lying down.   The contractions get weaker and are shorter lasting as time goes on.   The contractions do not usually become progressively stronger, regular, and closer together as with true labor.  True Labor  Contractions in true labor last 30-70 seconds, become very regular, usually become more intense, and increase in frequency.   The contractions do not go away with walking.   The discomfort is usually felt in the top of the uterus and spreads to the lower abdomen and low back.   True labor can be  determined by your health care provider with an exam. This will show that the cervix is dilating and getting thinner.  WHAT TO REMEMBER  Keep up with your usual exercises and follow other instructions given by your health care provider.   Take medicines as directed by your health care provider.   Keep your regular prenatal appointments.   Eat and drink lightly if you think you are going into labor.   If Braxton Hicks contractions are making you uncomfortable:   Change your position from lying down or resting to walking, or from walking to resting.   Sit and rest in a tub of warm water.   Drink 2-3 glasses of water. Dehydration may cause these contractions.   Do slow and deep breathing several times an hour.  WHEN SHOULD I SEEK IMMEDIATE MEDICAL CARE? Seek immediate medical care if:  Your contractions become stronger, more regular, and closer together.   You have fluid leaking or gushing from your vagina.   You have a fever.   You pass blood-tinged mucus.   You have vaginal bleeding.   You have continuous abdominal pain.   You have low back pain that you never had before.   You feel your baby's head  pushing down and causing pelvic pressure.   Your baby is not moving as much as it used to.    This information is not intended to replace advice given to you by your health care provider. Make sure you discuss any questions you have with your health care provider.   Document Released: 01/28/2005 Document Revised: 02/02/2013 Document Reviewed: 11/09/2012 Elsevier Interactive Patient Education [employer]

## 2015-09-13 LAB — CULTURE, OB URINE: Culture: <10000 — AB

## 2015-09-13 LAB — GC/CHLAMYDIA PROBE AMP (~~LOC~~) NOT AT ARMC
CHLAMYDIA, DNA PROBE: NEGATIVE
NEISSERIA GONORRHEA: NEGATIVE

## 2015-09-14 ENCOUNTER — Ambulatory Visit (INDEPENDENT_AMBULATORY_CARE_PROVIDER_SITE_OTHER): Payer: PRIVATE HEALTH INSURANCE | Admitting: Obstetrics & Gynecology

## 2015-09-14 ENCOUNTER — Encounter: Payer: Self-pay | Admitting: Obstetrics & Gynecology

## 2015-09-14 VITALS — BP 78/50 | HR 80 | Wt 277.0 lb

## 2015-09-14 DIAGNOSIS — O09893 Supervision of other high risk pregnancies, third trimester: Secondary | ICD-10-CM

## 2015-09-14 DIAGNOSIS — K831 Obstruction of bile duct: Secondary | ICD-10-CM

## 2015-09-14 DIAGNOSIS — Z331 Pregnant state, incidental: Secondary | ICD-10-CM

## 2015-09-14 DIAGNOSIS — O26613 Liver and biliary tract disorders in pregnancy, third trimester: Secondary | ICD-10-CM

## 2015-09-14 DIAGNOSIS — Z3A35 35 weeks gestation of pregnancy: Secondary | ICD-10-CM

## 2015-09-14 DIAGNOSIS — Z1389 Encounter for screening for other disorder: Secondary | ICD-10-CM

## 2015-09-14 LAB — POCT URINALYSIS DIPSTICK
Blood, UA: NEGATIVE
GLUCOSE UA: NEGATIVE
Glucose, UA: NEGATIVE
KETONES UA: NEGATIVE
KETONES UA: NEGATIVE
LEUKOCYTES UA: NEGATIVE
LEUKOCYTES UA: NEGATIVE
NITRITE UA: NEGATIVE
Nitrite, UA: NEGATIVE
PROTEIN UA: NEGATIVE
PROTEIN UA: NEGATIVE
RBC UA: NEGATIVE

## 2015-09-14 MED ORDER — HYDROCORTISONE 2.5 % EX CREA: TOPICAL_CREAM | Freq: Two times a day (BID) | CUTANEOUS | 0 refills | Status: DC

## 2015-09-14 NOTE — Progress Notes (Signed)
Fetal Surveillance Testing today:  FHR   High Risk Pregnancy Diagnosis(es):   Transient ICP(2nd trimester: resolved)  G2P1002 [redacted]w[redacted]d Estimated Date of Delivery: 10/19/15  Blood pressure (!) 78/50, pulse 80, weight 277 lb (125.6 kg), last menstrual period 11/26/2014.  Urinalysis: Negative   HPI: The patient is being seen today for ongoing management resolved ICP Today she reports of what appears to be delayed sensitivity contact like poison ivy on right leg.    BP weight and urine results all reviewed and noted. Patient reports good fetal movement, denies any bleeding and no rupture of membranes symptoms or regular contractions.  Fundal Height:  40 Fetal Heart rate:  145 Edema:  2+  Patient is without complaints other than noted in her HPI. All questions were answered.  All lab and sonogram results have been reviewed. Comments: normal   Assessment:  1.  Pregnancy at [redacted]w[redacted]d,  Estimated Date of Delivery: 10/19/15 :                          2.  Resolved ICP, spontaneously                        3.  Previous Caesarean section  Medication(s) Plans:  Hydrocortisone cream  Treatment Plan:  Sonogram in 2 weeks to check growth/EFW, bile acids today  Return in about 1 week (around 09/21/2015) for HROB. for appointment for high risk OB care  Meds ordered this encounter  Medications  . hydrocortisone 2.5 % cream    Sig: Apply topically 2 (two) times daily.    Dispense:  30 g    Refill:  0   Orders Placed This Encounter  Procedures  . Bile acids, total  . POCT urinalysis dipstick

## 2015-09-14 NOTE — Progress Notes (Signed)
Pt states that she has a rash on her legs, and she gets a sharp that shoots through her vagina when walking.

## 2015-09-15 LAB — BILE ACIDS, TOTAL: Bile Acids Total: 5.9 umol/L (ref 4.7–24.5)

## 2015-09-22 ENCOUNTER — Encounter: Payer: Self-pay | Admitting: Obstetrics & Gynecology

## 2015-09-22 ENCOUNTER — Ambulatory Visit (INDEPENDENT_AMBULATORY_CARE_PROVIDER_SITE_OTHER): Payer: PRIVATE HEALTH INSURANCE | Admitting: Obstetrics & Gynecology

## 2015-09-22 VITALS — BP 90/60 | HR 84 | Wt 278.0 lb

## 2015-09-22 DIAGNOSIS — IMO0002 Reserved for concepts with insufficient information to code with codable children: Secondary | ICD-10-CM

## 2015-09-22 DIAGNOSIS — O3663X1 Maternal care for excessive fetal growth, third trimester, fetus 1: Secondary | ICD-10-CM

## 2015-09-22 DIAGNOSIS — O09893 Supervision of other high risk pregnancies, third trimester: Secondary | ICD-10-CM

## 2015-09-22 DIAGNOSIS — Z331 Pregnant state, incidental: Secondary | ICD-10-CM

## 2015-09-22 DIAGNOSIS — Z1389 Encounter for screening for other disorder: Secondary | ICD-10-CM

## 2015-09-22 LAB — POCT URINALYSIS DIPSTICK
Glucose, UA: NEGATIVE
KETONES UA: NEGATIVE
Leukocytes, UA: NEGATIVE
Nitrite, UA: NEGATIVE
RBC UA: NEGATIVE

## 2015-09-22 NOTE — Progress Notes (Signed)
Fetal Surveillance Testing today:  FHR 145   High Risk Pregnancy Diagnosis(es):   ICP in 2nd trimester, resolved spontaneously  G2P1002 5447w1d Estimated Date of Delivery: 10/19/15  Blood pressure 90/60, pulse 84, weight 278 lb (126.1 kg), last menstrual period 11/26/2014.  Urinalysis: Negative   HPI: The patient is being seen today for ongoing management of as above. Today she reports some discharge exam reveals cervical mucous   BP weight and urine results all reviewed and noted. Patient reports good fetal movement, denies any bleeding and no rupture of membranes symptoms or regular contractions.  Fundal Height:  U+22 Fetal Heart rate:  145 Edema:  2+  Patient is without complaints other than noted in her HPI. All questions were answered.  All lab and sonogram results have been reviewed. Comments:    Assessment:  1.  Pregnancy at 5047w1d,  Estimated Date of Delivery: 10/19/15 :                          2.  ICP in 2nd trimester with spontaneous resolution                        3.  LGA  Medication(s) Plans:  none  Treatment Plan:  Sonogram next week for EFW  Return in about 1 week (around 09/29/2015) for Sonogram EFW, HROB, with Dr Despina HiddenEure. for appointment for high risk OB care  No orders of the defined types were placed in this encounter.  Orders Placed This Encounter  Procedures  . US OB Follow Up  . POCT urinalysis dipstick

## 2015-09-25 ENCOUNTER — Telehealth: Payer: Self-pay | Admitting: *Deleted

## 2015-09-25 NOTE — Telephone Encounter (Signed)
Spoke with pt. Pt is having nausea, vomiting, diarrhea and headache. Started last pm. Pt's mouth is moist and she is urinating more than normal. No fever. I spoke with Selena BattenKim, CNM and she advised [facility] need to [facility] to have BP checked. Pt states she has a BP cuff at home and can check it at home. I advised pt that BP cuff needs to fit arm right or she will get an abnormal reading. Pt states cuff fits her arm fine. I advised if BP is abnormal, [facility]. If BP is ok, then it's ok to take Zofran per Selena BattenKim, CNM. Pt voiced understanding. JSY

## 2015-09-29 ENCOUNTER — Ambulatory Visit (INDEPENDENT_AMBULATORY_CARE_PROVIDER_SITE_OTHER): Payer: PRIVATE HEALTH INSURANCE | Admitting: Obstetrics & Gynecology

## 2015-09-29 ENCOUNTER — Ambulatory Visit (INDEPENDENT_AMBULATORY_CARE_PROVIDER_SITE_OTHER): Payer: PRIVATE HEALTH INSURANCE

## 2015-09-29 ENCOUNTER — Encounter: Payer: Self-pay | Admitting: Obstetrics & Gynecology

## 2015-09-29 ENCOUNTER — Other Ambulatory Visit: Payer: Self-pay | Admitting: Obstetrics & Gynecology

## 2015-09-29 VITALS — BP 90/60 | HR 78 | Wt 277.3 lb

## 2015-09-29 DIAGNOSIS — O3663X1 Maternal care for excessive fetal growth, third trimester, fetus 1: Secondary | ICD-10-CM

## 2015-09-29 DIAGNOSIS — Z1159 Encounter for screening for other viral diseases: Secondary | ICD-10-CM

## 2015-09-29 DIAGNOSIS — Z118 Encounter for screening for other infectious and parasitic diseases: Secondary | ICD-10-CM

## 2015-09-29 DIAGNOSIS — O09893 Supervision of other high risk pregnancies, third trimester: Secondary | ICD-10-CM

## 2015-09-29 DIAGNOSIS — IMO0002 Reserved for concepts with insufficient information to code with codable children: Secondary | ICD-10-CM

## 2015-09-29 DIAGNOSIS — Z331 Pregnant state, incidental: Secondary | ICD-10-CM

## 2015-09-29 DIAGNOSIS — Z1389 Encounter for screening for other disorder: Secondary | ICD-10-CM

## 2015-09-29 DIAGNOSIS — Z3A37 37 weeks gestation of pregnancy: Secondary | ICD-10-CM

## 2015-09-29 DIAGNOSIS — Z3685 Encounter for antenatal screening for Streptococcus B: Secondary | ICD-10-CM

## 2015-09-29 LAB — POCT URINALYSIS DIPSTICK
Glucose, UA: NEGATIVE
Ketones, UA: NEGATIVE
LEUKOCYTES UA: NEGATIVE
NITRITE UA: NEGATIVE
Protein, UA: NEGATIVE

## 2015-09-29 NOTE — Progress Notes (Signed)
US 37+1 wks,cephalic,cx 4.6 cm,ant pl gr 2,afi 22 cm,fhr 148 bpm,bilat adnexa's wnl,EFW 3277g 65%

## 2015-09-29 NOTE — Progress Notes (Signed)
[id number] 8778w1d Estimated Date of Delivery: 10/19/15  Blood pressure 90/60, pulse 78, weight 277 lb 4.8 oz (125.8 kg), last menstrual period 11/26/2014.   BP weight and urine results all reviewed and noted.  Please refer to the obstetrical flow sheet for the fundal height and fetal heart rate documentation:  Patient reports good fetal movement, denies any bleeding and no rupture of membranes symptoms or regular contractions. Patient is without complaints. All questions were answered.  Orders Placed This Encounter  Procedures  . GC/Chlamydia Probe Amp  . Strep Gp B NAA  . POCT urinalysis dipstick    Plan:  Continued routine obstetrical care, EFW 65%, AFI 22 cm  Return in about 1 week (around 10/06/2015) for LROB, with Dr Despina HiddenEure.

## 2015-10-01 LAB — STREP GP B NAA: STREP GROUP B AG: POSITIVE — AB

## 2015-10-02 LAB — GC/CHLAMYDIA PROBE AMP
CHLAMYDIA, DNA PROBE: NEGATIVE
Neisseria gonorrhoeae by PCR: NEGATIVE

## 2015-10-06 ENCOUNTER — Encounter: Payer: Self-pay | Admitting: Obstetrics & Gynecology

## 2015-10-06 ENCOUNTER — Ambulatory Visit (INDEPENDENT_AMBULATORY_CARE_PROVIDER_SITE_OTHER): Payer: PRIVATE HEALTH INSURANCE | Admitting: Obstetrics & Gynecology

## 2015-10-06 VITALS — BP 102/70 | HR 74 | Wt 277.0 lb

## 2015-10-06 DIAGNOSIS — Z331 Pregnant state, incidental: Secondary | ICD-10-CM

## 2015-10-06 DIAGNOSIS — O3663X1 Maternal care for excessive fetal growth, third trimester, fetus 1: Secondary | ICD-10-CM

## 2015-10-06 DIAGNOSIS — Z1389 Encounter for screening for other disorder: Secondary | ICD-10-CM

## 2015-10-06 DIAGNOSIS — O09893 Supervision of other high risk pregnancies, third trimester: Secondary | ICD-10-CM

## 2015-10-06 DIAGNOSIS — Z3A39 39 weeks gestation of pregnancy: Secondary | ICD-10-CM

## 2015-10-06 DIAGNOSIS — IMO0002 Reserved for concepts with insufficient information to code with codable children: Secondary | ICD-10-CM

## 2015-10-06 LAB — POCT URINALYSIS DIPSTICK
GLUCOSE UA: NEGATIVE
Ketones, UA: NEGATIVE
LEUKOCYTES UA: NEGATIVE
NITRITE UA: NEGATIVE
Protein, UA: NEGATIVE
RBC UA: NEGATIVE

## 2015-10-06 NOTE — Progress Notes (Signed)
[id number] 5356w1d Estimated Date of Delivery: 10/19/15  Blood pressure 102/70, pulse 74, weight 277 lb (125.6 kg), last menstrual period 11/26/2014.   BP weight and urine results all reviewed and noted.  Please refer to the obstetrical flow sheet for the fundal height and fetal heart rate documentation:  Patient reports good fetal movement, denies any bleeding and no rupture of membranes symptoms or regular contractions. Patient is without complaints. All questions were answered.  Orders Placed This Encounter  Procedures  . POCT urinalysis dipstick    Plan:  Continued routine obstetrical care,   Return in about 1 week (around 10/13/2015) for LROB, with Dr Despina HiddenEure.

## 2015-10-08 ENCOUNTER — Inpatient Hospital Stay (HOSPITAL_COMMUNITY)
Admission: AD | Admit: 2015-10-08 | Discharge: 2015-10-08 | Disposition: A | Discharge status: Home / Self Care | Payer: PRIVATE HEALTH INSURANCE | Source: Ambulatory Visit | Attending: Obstetrics & Gynecology | Admitting: Obstetrics & Gynecology

## 2015-10-08 ENCOUNTER — Encounter (HOSPITAL_COMMUNITY): Payer: Self-pay | Admitting: *Deleted

## 2015-10-08 DIAGNOSIS — M546 Pain in thoracic spine: Secondary | ICD-10-CM | POA: Insufficient documentation

## 2015-10-08 DIAGNOSIS — E039 Hypothyroidism, unspecified: Secondary | ICD-10-CM | POA: Insufficient documentation

## 2015-10-08 DIAGNOSIS — N83209 Unspecified ovarian cyst, unspecified side: Secondary | ICD-10-CM | POA: Insufficient documentation

## 2015-10-08 DIAGNOSIS — E669 Obesity, unspecified: Secondary | ICD-10-CM | POA: Diagnosis not present

## 2015-10-08 DIAGNOSIS — G8929 Other chronic pain: Secondary | ICD-10-CM | POA: Insufficient documentation

## 2015-10-08 DIAGNOSIS — M549 Dorsalgia, unspecified: Secondary | ICD-10-CM

## 2015-10-08 LAB — URINALYSIS, ROUTINE W REFLEX MICROSCOPIC
Bilirubin Urine: NEGATIVE
GLUCOSE, UA: NEGATIVE mg/dL
Ketones, ur: NEGATIVE mg/dL
NITRITE: NEGATIVE
PH: 6 (ref 5.0–8.0)
PROTEIN: NEGATIVE mg/dL

## 2015-10-08 LAB — URINE MICROSCOPIC-ADD ON

## 2015-10-08 MED ORDER — CYCLOBENZAPRINE HCL 10 MG PO TABS: 10.0000 mg | ORAL_TABLET | Freq: Two times a day (BID) | ORAL | 0 refills | Status: DC | PRN

## 2015-10-08 MED ORDER — CYCLOBENZAPRINE HCL 10 MG PO TABS
10.0000 mg | ORAL_TABLET | Freq: Once | ORAL | Status: AC
  Administered 2015-10-08: 10 mg via ORAL
  Filled 2015-10-08: qty 1

## 2015-10-08 NOTE — Progress Notes (Signed)
Ok per Ivonne AndrewV. Smith CNM to dc efm per pt request

## 2015-10-08 NOTE — MAU Note (Signed)
Pt now states pain is upper back between shoulders.

## 2015-10-08 NOTE — Progress Notes (Signed)
Lisa DistanceStacy Wiley CNM student notified of pt's admission and status. Will see pt

## 2015-10-08 NOTE — Discharge Instructions (Signed)

## 2015-10-08 NOTE — Progress Notes (Signed)
J Rasch NP in earlier to discuss d/c plan. Written and verbal d/c instructions given and understanding vocied.

## 2015-10-08 NOTE — MAU Note (Signed)
Back pain for 3-4 hrs. Sharp pain that is constant. Denies LOF or bleeding.

## 2015-10-08 NOTE — MAU Provider Note (Signed)
History     CSN: [id number]  Arrival date and time: 10/08/15 0431   First Provider Initiated Contact with Patient 10/08/15 [phone]      Chief Complaint  Patient presents with  . Back Pain   HPI   [name] is a 30 y.o. female G2P1001 @ G2P1001 here with upper back pain. The pain is located above her bra strap and in the center of her back, sometimes worse toward the left side. . She has never had this pain before. The pain started at 10 pm last night. She took tylenol for pain last night and that did not touch the pain. No know injury to this area, no recent lifting or bending.   She currently rates her pain 7/10; the pain is constant.  Patient does not have a history of GERD, however knows what that feels like. She ate sausage for dinner last night.   She denies vaginal bleeding or leaking of fluid.    OB History    Gravida Para Term Preterm AB Living   2 1 1     2    SAB TAB Ectopic Multiple Live Births         1 2      Past Medical History:  Diagnosis Date  . Breast nodule 10/10/2014  . Breast pain 10/10/2014  . BV (bacterial vaginosis) 11/12/2012  . Fatty liver   . Hypothyroid 02/14/2015  . Irregular menses 05/21/2013  . Migraines   . Obesity   . Other and unspecified ovarian cyst 05/31/2013   Right complex ?cystic vs solid mass on ovary will schedule appt with [facility]  . Pregnant 02/14/2015  . Thyroid disease     Past Surgical History:  Procedure Laterality Date  . CESAREAN SECTION    . COLONOSCOPY    . OOPHORECTOMY    . TONSILLECTOMY    . WISDOM TOOTH EXTRACTION      Family History  Problem Relation Age of Onset  . Diabetes Maternal Grandmother   . Hypertension Maternal Grandmother   . Cancer Maternal Grandmother     breast  . Diabetes Maternal Grandfather   . Hypertension Maternal Grandfather   . Leukemia Maternal Grandfather   . Diabetes Mother   . Hypertension Mother   . Heart attack Mother   . Stroke Mother   . Kidney disease Paternal Grandfather    . Seizures Father   . Other Father     back problems  . Dementia Paternal Grandmother     Social History  Substance Use Topics  . Smoking status: Former Smoker    Packs/day: 0.25    Years: 8.00    Types: Cigarettes    Quit date: 10/07/2010  . Smokeless tobacco: Never Used  . Alcohol use No     Comment: occ.; not now    Allergies:  Allergies  Allergen Reactions  . Imitrex [Sumatriptan]     Makes migraines worse.   . Maxalt [Rizatriptan]     Makes migraines worse  . Phenergan [Promethazine Hcl] Nausea And Vomiting  . Tape Other (See Comments)    Blisters on abdomen after C Section    Prescriptions Prior to Admission  Medication Sig Dispense Refill Last Dose  . acetaminophen (TYLENOL) 500 MG tablet Take 1,000 mg by mouth every 6 (six) hours as needed for mild pain or headache. Reported on 05/02/2015   10/07/2015 at Unknown time  . nystatin cream (MYCOSTATIN) Apply 1 application topically 2 (two) times daily.   10/07/2015 at  Unknown time  . Pediatric Multiple Vit-C-FA (FLINSTONES GUMMIES OMEGA-3 DHA PO) Take 1 each by mouth daily. Reported on 05/30/2015   10/07/2015 at Unknown time  . thyroid 81.25 MG TABS Take 65 mg by mouth daily. (Patient taking differently: Take 81.25 mg by mouth daily. ) 30 tablet 1 10/07/2015 at Unknown time   Results for orders placed or performed during the hospital encounter of 10/08/15 (from the past 72 hour(s))  Urinalysis, Routine w reflex microscopic (not at [facility])     Status: Abnormal   Collection Time: 10/08/15  5:00 AM  Result Value Ref Range   Color, Urine YELLOW YELLOW   APPearance CLEAR CLEAR   Specific Gravity, Urine <1.005 (L) 1.005 - 1.030   pH 6.0 5.0 - 8.0   Glucose, UA NEGATIVE NEGATIVE mg/dL   Hgb urine dipstick TRACE (A) NEGATIVE   Bilirubin Urine NEGATIVE NEGATIVE   Ketones, ur NEGATIVE NEGATIVE mg/dL   Protein, ur NEGATIVE NEGATIVE mg/dL   Nitrite NEGATIVE NEGATIVE   Leukocytes, UA MODERATE (A) NEGATIVE  Urine microscopic-add on      Status: Abnormal   Collection Time: 10/08/15  5:00 AM  Result Value Ref Range   Squamous Epithelial / LPF 0-5 (A) NONE SEEN   WBC, UA 6-30 0 - 5 WBC/hpf   RBC / HPF 0-5 0 - 5 RBC/hpf   Bacteria, UA FEW (A) NONE SEEN   Urine-Other MUCOUS PRESENT     Review of Systems  Constitutional: Negative for chills and fever.  Genitourinary: Negative for dysuria, flank pain, frequency, hematuria and urgency.  Musculoskeletal: Positive for back pain.   Physical Exam   Blood pressure 99/68, pulse 97, temperature 97.5 F (36.4 C), resp. rate 20, height 5\' 2"  (1.575 m), weight 279 lb 3.2 oz (126.6 kg), last menstrual period 11/26/2014, SpO2 99 %.  Physical Exam  Constitutional: She is oriented to person, place, and time. She appears well-developed and well-nourished. No distress.  Respiratory: Effort normal.  GI: Soft.  Genitourinary:  Genitourinary Comments: Dilation: Fingertip Effacement (%): Thick Cervical Position: Posterior Station: Ballotable Presentation: Undeterminable Exam by:: Quintella BatonJo Barham RNC  Musculoskeletal: Normal range of motion.       Thoracic back: She exhibits tenderness and pain. She exhibits normal range of motion, no swelling, no edema and no spasm.       Back:  Neurological: She is alert and oriented to person, place, and time.  Skin: Skin is warm. She is not diaphoretic.  Psychiatric: Her behavior is normal.    MAU Course  Procedures  None  MDM  UA> patient is asymptomatic  Flexeril > patient declines narcotic pain medication.  Urine culture.   Assessment and Plan   A:  1. Acute upper back pain     P:  Discharge home in stable condition Rx: Flexeril Return to MAU if symptoms worsen Labor precautions Fetal kick counts  Duane LopeJennifer I Kolette Vey, NP 10/08/2015 7:38 AM

## 2015-10-09 ENCOUNTER — Ambulatory Visit (INDEPENDENT_AMBULATORY_CARE_PROVIDER_SITE_OTHER): Payer: PRIVATE HEALTH INSURANCE | Admitting: Women's Health

## 2015-10-09 VITALS — BP 98/62 | HR 92 | Wt 276.2 lb

## 2015-10-09 DIAGNOSIS — Z331 Pregnant state, incidental: Secondary | ICD-10-CM

## 2015-10-09 DIAGNOSIS — Z1389 Encounter for screening for other disorder: Secondary | ICD-10-CM

## 2015-10-09 DIAGNOSIS — Z3493 Encounter for supervision of normal pregnancy, unspecified, third trimester: Secondary | ICD-10-CM

## 2015-10-09 LAB — POCT URINALYSIS DIPSTICK
Blood, UA: NEGATIVE
Glucose, UA: NEGATIVE
KETONES UA: NEGATIVE
Leukocytes, UA: NEGATIVE
Nitrite, UA: NEGATIVE
PROTEIN UA: NEGATIVE

## 2015-10-09 LAB — CULTURE, OB URINE: SPECIAL REQUESTS: NORMAL

## 2015-10-09 NOTE — Patient Instructions (Signed)
Call the office [phone]) or go to [facility] if:  You begin to have strong, frequent contractions  Your water breaks.  Sometimes it is a big gush of fluid, sometimes it is just a trickle that keeps getting your panties wet or running down your legs  You have vaginal bleeding.  It is normal to have a small amount of spotting if your cervix was checked.   You don't feel your baby moving like normal.  If you don't, get you something to eat and drink and lay down and focus on feeling your baby move.  You should feel at least 10 movements in 2 hours.  If you don't, you should call the office or [facility] to [facility].    [facility] pain  For your lower back pain you may:  Purchase a pregnancy belt from [employer], [employer], [employer], etc and wear it while you are up and about  Take warm baths  Use a heating pad to your lower back for no longer than 20 minutes at a time, and do not place near abdomen Take tylenol as needed. Please follow directions on the bottle  Braxton Hicks Contractions Contractions of the uterus can occur throughout pregnancy. Contractions are not always a sign that you are in labor.  WHAT ARE BRAXTON HICKS CONTRACTIONS?  Contractions that occur before labor are called Braxton Hicks contractions, or false labor. Toward the end of pregnancy (32-34 weeks), these contractions can develop more often and may become more forceful. This is not true labor because these contractions do not result in opening (dilatation) and thinning of the cervix. They are sometimes difficult to tell apart from true labor because these contractions can be forceful and people have different pain tolerances. You should not feel embarrassed if you go to the hospital with false labor. Sometimes, the only way to tell if you are in true labor is for your health care provider to look for changes in the cervix. If there are no prenatal problems or other health problems  associated with the pregnancy, it is completely safe to be sent home with false labor and await the onset of true labor. HOW CAN YOU TELL THE DIFFERENCE BETWEEN TRUE AND FALSE LABOR? False Labor  The contractions of false labor are usually shorter and not as hard as those of true labor.   The contractions are usually irregular.   The contractions are often felt in the front of the lower abdomen and in the groin.   The contractions may go away when you walk around or change positions while lying down.   The contractions get weaker and are shorter lasting as time goes on.   The contractions do not usually become progressively stronger, regular, and closer together as with true labor.  True Labor  Contractions in true labor last 30-70 seconds, become very regular, usually become more intense, and increase in frequency.   The contractions do not go away with walking.   The discomfort is usually felt in the top of the uterus and spreads to the lower abdomen and low back.   True labor can be determined by your health care provider with an exam. This will show that the cervix is dilating and getting thinner.  WHAT TO REMEMBER  Keep up with your usual exercises and follow other instructions given by your health care provider.   Take medicines as directed by your health care provider.   Keep your regular prenatal appointments.  Eat and drink lightly if you think you are going into labor.   If Braxton Hicks contractions are making you uncomfortable:   Change your position from lying down or resting to walking, or from walking to resting.   Sit and rest in a tub of warm water.   Drink 2-3 glasses of water. Dehydration may cause these contractions.   Do slow and deep breathing several times an hour.  WHEN SHOULD I SEEK IMMEDIATE MEDICAL CARE? Seek immediate medical care if:  Your contractions become stronger, more regular, and closer together.   You have fluid  leaking or gushing from your vagina.   You have a fever.   You pass blood-tinged mucus.   You have vaginal bleeding.   You have continuous abdominal pain.   You have low back pain that you never had before.   You feel your baby's head pushing down and causing pelvic pressure.   Your baby is not moving as much as it used to.    This information is not intended to replace advice given to you by your health care provider. Make sure you discuss any questions you have with your health care provider.   Document Released: 01/28/2005 Document Revised: 02/02/2013 Document Reviewed: 11/09/2012 Elsevier Interactive Patient Education [employer]

## 2015-10-09 NOTE — Progress Notes (Signed)
Low-risk OB appointment G2P1002 8466w4d Estimated Date of Delivery: 10/19/15 BP 98/62   Pulse 92   Wt 276 lb 3.2 oz (125.3 kg)   LMP 11/26/2014 (Approximate)   BMI 50.52 kg/m   BP, weight, and urine reviewed.  Refer to obstetrical flow sheet for FH & FHR.  Reports good fm.  Denies regular uc's, lof, vb, or uti s/s. Mid back pain radiating down to lower back and out to sides- got rx [facility] flexeril [facility]- not helping much. Does have trigger tenderness just below bra strap- to continue flexeril- get lacrosse ball and massage areas against wall w/ ball. Still desires TOLAC. Cholestasis has resolved- bile acids on 8/3 were 5.9 SVE per request: ft/th/-3 Reviewed labor s/s, fkc. Recommended Tdap at HD/PCP per CDC guidelines.  Plan:  Continue routine obstetrical care  F/U keep appt on Fri as scheduled for OB appointment

## 2015-10-13 ENCOUNTER — Encounter: Payer: PRIVATE HEALTH INSURANCE | Admitting: Obstetrics & Gynecology

## 2015-10-13 ENCOUNTER — Telehealth (HOSPITAL_COMMUNITY): Payer: Self-pay | Admitting: *Deleted

## 2015-10-13 ENCOUNTER — Ambulatory Visit (INDEPENDENT_AMBULATORY_CARE_PROVIDER_SITE_OTHER): Payer: PRIVATE HEALTH INSURANCE | Admitting: Obstetrics & Gynecology

## 2015-10-13 ENCOUNTER — Encounter: Payer: Self-pay | Admitting: Obstetrics & Gynecology

## 2015-10-13 VITALS — BP 110/62 | HR 78 | Wt 279.0 lb

## 2015-10-13 DIAGNOSIS — Z3A40 40 weeks gestation of pregnancy: Secondary | ICD-10-CM

## 2015-10-13 DIAGNOSIS — Z3483 Encounter for supervision of other normal pregnancy, third trimester: Secondary | ICD-10-CM

## 2015-10-13 DIAGNOSIS — K831 Obstruction of bile duct: Secondary | ICD-10-CM

## 2015-10-13 DIAGNOSIS — Z1389 Encounter for screening for other disorder: Secondary | ICD-10-CM

## 2015-10-13 DIAGNOSIS — Z3493 Encounter for supervision of normal pregnancy, unspecified, third trimester: Secondary | ICD-10-CM

## 2015-10-13 DIAGNOSIS — Z331 Pregnant state, incidental: Secondary | ICD-10-CM

## 2015-10-13 DIAGNOSIS — O26613 Liver and biliary tract disorders in pregnancy, third trimester: Secondary | ICD-10-CM | POA: Diagnosis not present

## 2015-10-13 DIAGNOSIS — O26643 Intrahepatic cholestasis of pregnancy, third trimester: Secondary | ICD-10-CM

## 2015-10-13 LAB — POCT URINALYSIS DIPSTICK
Glucose, UA: NEGATIVE
KETONES UA: NEGATIVE
LEUKOCYTES UA: NEGATIVE
Nitrite, UA: NEGATIVE
PROTEIN UA: NEGATIVE
RBC UA: NEGATIVE

## 2015-10-13 NOTE — Progress Notes (Addendum)
   Induction Assessment Scheduling Form: [facility]ax to [facility]:  [phone]  [name]                                                                                   DOB:  03-23-1985                                                            MRN:  [id number]                                                                     Phone #:   [phone]                         Provider:  [facility]  GP:  [id number]                                                            Estimated Date of Delivery: 10/19/15  Dating Criteria: 6256w2d sonogram    Medical Indications for induction:  Maternal ICP, resolved, Previous Caesarean section Even with resolution of ICP(which is rare according to the literature) feel delivery at 40 weeks is most appropriate management course Admission Date/Time:  10/19/15 Gestational age on admission:  4958w0d   Filed Weights   10/13/15 [id number]  Weight: 279 lb (126.6 kg)   HIV:  Non Reactive (06/08 0931) GBS: Positive (08/18 1300)  0.5 cm dilated, 0 effaced, -3 station, cervical position post, consistency soft, Bishop score 2, presenting part vertex, EFW 65%   Method of induction(proposed):  foley   Scheduling Provider Signature:  Lazaro ArmsEURE,Mardi Cannady H, MD                                            Today's Date:  10/13/2015

## 2015-10-13 NOTE — Progress Notes (Signed)
[id number] 3534w1d Estimated Date of Delivery: 10/19/15  Blood pressure 110/62, pulse 78, weight 279 lb (126.6 kg), last menstrual period 11/26/2014.   BP weight and urine results all reviewed and noted.  Please refer to the obstetrical flow sheet for the fundal height and fetal heart rate documentation:  Patient reports good fetal movement, denies any bleeding and no rupture of membranes symptoms or regular contractions. Patient is without complaints. All questions were answered.  Orders Placed This Encounter  Procedures  . POCT urinalysis dipstick    Plan:  Continued routine obstetrical care,   No Follow-up on file.

## 2015-10-13 NOTE — Telephone Encounter (Signed)
Preadmission screen  

## 2015-10-19 ENCOUNTER — Encounter (HOSPITAL_COMMUNITY): Payer: Self-pay

## 2015-10-19 ENCOUNTER — Inpatient Hospital Stay (HOSPITAL_COMMUNITY)
Admission: RE | Admit: 2015-10-19 | Discharge: 2015-10-23 | DRG: 775 | Disposition: A | Discharge status: Home / Self Care | Payer: PRIVATE HEALTH INSURANCE | Source: Ambulatory Visit | Attending: Obstetrics and Gynecology | Admitting: Obstetrics and Gynecology

## 2015-10-19 ENCOUNTER — Other Ambulatory Visit: Payer: Self-pay | Admitting: Advanced Practice Midwife

## 2015-10-19 DIAGNOSIS — Z8249 Family history of ischemic heart disease and other diseases of the circulatory system: Secondary | ICD-10-CM | POA: Diagnosis not present

## 2015-10-19 DIAGNOSIS — Z87891 Personal history of nicotine dependence: Secondary | ICD-10-CM

## 2015-10-19 DIAGNOSIS — O26613 Liver and biliary tract disorders in pregnancy, third trimester: Secondary | ICD-10-CM

## 2015-10-19 DIAGNOSIS — Z3A40 40 weeks gestation of pregnancy: Secondary | ICD-10-CM | POA: Diagnosis not present

## 2015-10-19 DIAGNOSIS — O34211 Maternal care for low transverse scar from previous cesarean delivery: Secondary | ICD-10-CM | POA: Diagnosis present

## 2015-10-19 DIAGNOSIS — O99214 Obesity complicating childbirth: Secondary | ICD-10-CM | POA: Diagnosis present

## 2015-10-19 DIAGNOSIS — Z823 Family history of stroke: Secondary | ICD-10-CM | POA: Diagnosis not present

## 2015-10-19 DIAGNOSIS — K831 Obstruction of bile duct: Secondary | ICD-10-CM | POA: Diagnosis present

## 2015-10-19 DIAGNOSIS — O99824 Streptococcus B carrier state complicating childbirth: Secondary | ICD-10-CM | POA: Diagnosis present

## 2015-10-19 DIAGNOSIS — E039 Hypothyroidism, unspecified: Secondary | ICD-10-CM | POA: Diagnosis present

## 2015-10-19 DIAGNOSIS — O99284 Endocrine, nutritional and metabolic diseases complicating childbirth: Secondary | ICD-10-CM | POA: Diagnosis present

## 2015-10-19 DIAGNOSIS — Z833 Family history of diabetes mellitus: Secondary | ICD-10-CM

## 2015-10-19 DIAGNOSIS — O2662 Liver and biliary tract disorders in childbirth: Secondary | ICD-10-CM | POA: Diagnosis present

## 2015-10-19 DIAGNOSIS — Z3493 Encounter for supervision of normal pregnancy, unspecified, third trimester: Secondary | ICD-10-CM

## 2015-10-19 DIAGNOSIS — Z6841 Body Mass Index (BMI) 40.0 and over, adult: Secondary | ICD-10-CM

## 2015-10-19 LAB — TYPE AND SCREEN
ABO/RH(D): O POS
ANTIBODY SCREEN: NEGATIVE

## 2015-10-19 LAB — CBC
HCT: 36.1 % (ref 36.0–46.0)
HEMOGLOBIN: 12.3 g/dL (ref 12.0–15.0)
MCH: 29.9 pg (ref 26.0–34.0)
MCHC: 34.1 g/dL (ref 30.0–36.0)
MCV: 87.6 fL (ref 78.0–100.0)
PLATELETS: 281 10*3/uL (ref 150–400)
RBC: 4.12 MIL/uL (ref 3.87–5.11)
RDW: 15 % (ref 11.5–15.5)
WBC: 11.1 10*3/uL — ABNORMAL HIGH (ref 4.0–10.5)

## 2015-10-19 LAB — RPR: RPR Ser Ql: NONREACTIVE

## 2015-10-19 MED ORDER — OXYTOCIN BOLUS FROM INFUSION
500.0000 mL | Freq: Once | INTRAVENOUS | Status: AC
  Administered 2015-10-21: 500 mL via INTRAVENOUS

## 2015-10-19 MED ORDER — THYROID 81.25 MG PO TABS: 81.2500 mg | ORAL_TABLET | Freq: Every day | ORAL | Status: DC

## 2015-10-19 MED ORDER — PENICILLIN G POTASSIUM 5000000 UNITS IJ SOLR
5.0000 10*6.[IU] | Freq: Once | INTRAMUSCULAR | Status: AC
  Administered 2015-10-20: 5 10*6.[IU] via INTRAVENOUS
  Filled 2015-10-19 (×2): qty 5

## 2015-10-19 MED ORDER — ACETAMINOPHEN 325 MG PO TABS: 650.0000 mg | ORAL_TABLET | ORAL | Status: DC | PRN

## 2015-10-19 MED ORDER — OXYCODONE-ACETAMINOPHEN 5-325 MG PO TABS: 1.0000 | ORAL_TABLET | ORAL | Status: DC | PRN

## 2015-10-19 MED ORDER — ONDANSETRON HCL 4 MG/2ML IJ SOLN
4.0000 mg | Freq: Four times a day (QID) | INTRAMUSCULAR | Status: DC | PRN
  Administered 2015-10-21: 4 mg via INTRAVENOUS
  Filled 2015-10-19: qty 2

## 2015-10-19 MED ORDER — OXYTOCIN 40 UNITS IN LACTATED RINGERS INFUSION - SIMPLE MED
2.5000 [IU]/h | INTRAVENOUS | Status: DC
  Filled 2015-10-19 (×2): qty 1000

## 2015-10-19 MED ORDER — OXYTOCIN 40 UNITS IN LACTATED RINGERS INFUSION - SIMPLE MED: 1.0000 m[IU]/min | INTRAVENOUS | Status: DC

## 2015-10-19 MED ORDER — LACTATED RINGERS IV SOLN: 500.0000 mL | INTRAVENOUS | Status: DC | PRN

## 2015-10-19 MED ORDER — LIDOCAINE HCL (PF) 1 % IJ SOLN
30.0000 mL | INTRAMUSCULAR | Status: AC | PRN
  Administered 2015-10-21: 30 mL via SUBCUTANEOUS
  Filled 2015-10-19: qty 30

## 2015-10-19 MED ORDER — OXYCODONE-ACETAMINOPHEN 5-325 MG PO TABS: 2.0000 | ORAL_TABLET | ORAL | Status: DC | PRN

## 2015-10-19 MED ORDER — TERBUTALINE SULFATE 1 MG/ML IJ SOLN
0.2500 mg | Freq: Once | INTRAMUSCULAR | Status: DC | PRN
Start: 2015-10-19 — End: 2015-10-21
  Filled 2015-10-19: qty 1

## 2015-10-19 MED ORDER — OXYTOCIN 40 UNITS IN LACTATED RINGERS INFUSION - SIMPLE MED
1.0000 m[IU]/min | INTRAVENOUS | Status: DC
  Administered 2015-10-19: 1 m[IU]/min via INTRAVENOUS

## 2015-10-19 MED ORDER — LACTATED RINGERS IV SOLN
INTRAVENOUS | Status: DC
  Administered 2015-10-19 – 2015-10-21 (×3): via INTRAVENOUS

## 2015-10-19 MED ORDER — FENTANYL CITRATE (PF) 100 MCG/2ML IJ SOLN
100.0000 ug | INTRAMUSCULAR | Status: DC | PRN
  Administered 2015-10-20 (×5): 100 ug via INTRAVENOUS
  Filled 2015-10-19 (×5): qty 2

## 2015-10-19 MED ORDER — OXYTOCIN 40 UNITS IN LACTATED RINGERS INFUSION - SIMPLE MED
1.0000 m[IU]/min | INTRAVENOUS | Status: DC
  Administered 2015-10-20: 2 m[IU]/min via INTRAVENOUS

## 2015-10-19 MED ORDER — THYROID 81.25 MG PO TABS
81.2500 mg | ORAL_TABLET | Freq: Every day | ORAL | Status: DC
  Administered 2015-10-19 – 2015-10-21 (×3): 81.25 mg via ORAL
  Filled 2015-10-19 (×4): qty 1

## 2015-10-19 MED ORDER — SOD CITRATE-CITRIC ACID 500-334 MG/5ML PO SOLN: 30.0000 mL | ORAL | Status: DC | PRN

## 2015-10-19 MED ORDER — PENICILLIN G POTASSIUM 5000000 UNITS IJ SOLR
2.5000 10*6.[IU] | INTRAVENOUS | Status: DC
  Administered 2015-10-20: 2.5 10*6.[IU] via INTRAVENOUS
  Filled 2015-10-19 (×10): qty 2.5

## 2015-10-19 NOTE — Progress Notes (Signed)
Foley inserted into cx and inflated with 60cc H20.  Cx 1/thick/-3. FHR Cat 1.  Will stop pitocin and restart it when foley falls out.

## 2015-10-19 NOTE — Anesthesia Pain Management Evaluation Note (Signed)
  CRNA Pain Management Visit Note  Patient: [name], 30 y.o., female  "Hello I am a member of the anesthesia team at [facility]. We have an anesthesia team available at all times to provide care throughout the hospital, including epidural management and anesthesia for C-section. I don't know your plan for the delivery whether it a natural birth, water birth, IV sedation, nitrous supplementation, doula or epidural, but we want to meet your pain goals."   1.Was your pain managed to your expectations on prior hospitalizations?   Yes   2.What is your expectation for pain management during this hospitalization?     Epidural  3.How can we help you reach that goal? unsure  Record the patient's initial score and the patient's pain goal.   Pain: 0  Pain Goal: 7 [facility] wants you to be able to say your pain was always managed very well.  [name],[name] 10/19/2015

## 2015-10-19 NOTE — H&P (Signed)
LABOR AND DELIVERY ADMISSION HISTORY AND PHYSICAL NOTE  [name] is a 30 y.o. female G2P1002 with IUP at 7230w0d by 6-wk U/S presenting for IOL for cholestasis of pregnancy. Pregnancy was complicated prior h/o cesarean section (for breech twin), and cholestasis of pregnancy in second trimester, which seems to have had spontaneous resolution. She is GBS positive.   She reports positive fetal movement. She denies leakage of fluid or vaginal bleeding.  [facility]Com[facility] - [facility] ICP - Previous C/S - Hypothyroidism (on Naturethryroid 65 mg)  Past Medical History: Past Medical History:  Diagnosis Date  . Breast nodule 10/10/2014  . Breast pain 10/10/2014  . BV (bacterial vaginosis) 11/12/2012  . Fatty liver   . Hypothyroid 02/14/2015  . Irregular menses 05/21/2013  . Migraines   . Obesity   . Other and unspecified ovarian cyst 05/31/2013   Right complex ?cystic vs solid mass on ovary will schedule appt with [facility]  . Pregnant 02/14/2015  . Thyroid disease     Past Surgical History: Past Surgical History:  Procedure Laterality Date  . CESAREAN SECTION    . COLONOSCOPY    . OOPHORECTOMY    . TONSILLECTOMY    . WISDOM TOOTH EXTRACTION      Obstetrical History: OB History    Gravida Para Term Preterm AB Living   2 1 1     2    SAB TAB Ectopic Multiple Live Births         1 2      Social History: Social History   Social History  . Marital status: Single    Spouse name: N/A  . Number of children: N/A  . Years of education: N/A   Social History Main Topics  . Smoking status: Former Smoker    Packs/day: 0.25    Years: 8.00    Types: Cigarettes    Quit date: 10/07/2010  . Smokeless tobacco: Never Used  . Alcohol use No     Comment: occ.; not now  . Drug use: No  . Sexual activity: Yes    Birth control/ protection: None   Other Topics Concern  . None   Social History Narrative  . None    Family History: Family History  Problem  Relation Age of Onset  . Diabetes Maternal Grandmother   . Hypertension Maternal Grandmother   . Cancer Maternal Grandmother     breast  . Diabetes Maternal Grandfather   . Hypertension Maternal Grandfather   . Leukemia Maternal Grandfather   . Diabetes Mother   . Hypertension Mother   . Heart attack Mother   . Stroke Mother   . Kidney disease Paternal Grandfather   . Seizures Father   . Other Father     back problems  . Dementia Paternal Grandmother     Allergies: Allergies  Allergen Reactions  . Imitrex [Sumatriptan]     Makes migraines worse.   . Maxalt [Rizatriptan]     Makes migraines worse  . Phenergan [Promethazine Hcl] Nausea And Vomiting  . Tape Other (See Comments)    Blisters on abdomen after C Section    Prescriptions Prior to Admission  Medication Sig Dispense Refill Last Dose  . acetaminophen (TYLENOL) 500 MG tablet Take 1,000 mg by mouth every 6 (six) hours as needed for mild pain or headache. Reported on 05/02/2015   10/18/2015 at Unknown time  . cyclobenzaprine (FLEXERIL) 10 MG tablet Take 1 tablet (10 mg total) by mouth 2 (two)  times daily as needed for muscle spasms. 10 tablet 0 Past Week at Unknown time  . nystatin cream (MYCOSTATIN) Apply 1 application topically 2 (two) times daily.   10/12/2015 at Unknown time  . Pediatric Multiple Vit-C-FA (FLINSTONES GUMMIES OMEGA-3 DHA PO) Take 1 each by mouth daily. Reported on 05/30/2015   10/18/2015 at Unknown time  . PRESCRIPTION MEDICATION Take 1 tablet by mouth daily. Pt takes 81.25 mg Thyroid tablet. Was not found in the database.   10/18/2015 at Unknown time     Review of Systems  All systems reviewed and negative except as stated in HPI  Blood pressure 100/71, pulse (!) 137, temperature 98.1 F (36.7 C), temperature source Oral, resp. rate 18, height 5\' 2"  (1.575 m), weight 279 lb (126.6 kg), last menstrual period 11/26/2014. General appearance: alert, cooperative and no distress Lungs: clear to auscultation  bilaterally Heart: regular rate and rhythm Abdomen: soft, non-tender; bowel sounds normal Extremities: No calf swelling or tenderness Presentation: cephalic Fetal monitoring: baseline rate 140, moderate varibility, +acel, no decel Uterine activity: no ctx    Prenatal labs: ABO, Rh: --/--/O POS (09/07 0755) Antibody: NEG (09/07 0755) Rubella: !Error! RPR: Non Reactive (06/08 0931)  HBsAg: Negative (01/24 0946)  HIV: Non Reactive (06/08 0931)  GBS: Positive (08/18 1300)  2 hr Glucola: normal Genetic screening:  NT/IT neg Anatomy US: normal female  Prenatal Transfer Tool  Maternal Diabetes: No Genetic Screening: Normal Maternal Ultrasounds/Referrals: Normal Fetal Ultrasounds or other Referrals:  None Maternal Substance Abuse:  No Significant Maternal Medications:  None Significant Maternal Lab Results: None  Results for orders placed or performed during the hospital encounter of 10/19/15 (from the past 24 hour(s))  CBC   Collection Time: 10/19/15  7:55 AM  Result Value Ref Range   WBC 11.1 (H) 4.0 - 10.5 K/uL   RBC 4.12 3.87 - 5.11 MIL/uL   Hemoglobin 12.3 12.0 - 15.0 g/dL   HCT 16.1 09.6 - 04.5 %   MCV 87.6 78.0 - 100.0 fL   MCH 29.9 26.0 - 34.0 pg   MCHC 34.1 30.0 - 36.0 g/dL   RDW 40.9 81.1 - 91.4 %   Platelets 281 150 - 400 K/uL  Type and screen [facility]   Collection Time: 10/19/15  7:55 AM  Result Value Ref Range   ABO/RH(D) O POS    Antibody Screen NEG    Sample Expiration 10/22/2015     Patient Active Problem List   Diagnosis Date Noted  . Cholestasis of pregnancy in third trimester 10/19/2015  . Intrahepatic cholestasis of pregnancy, antepartum- RESOLVED 06/05/2015  . Anxiety 05/30/2015  . Herpes labialis 05/02/2015  . Supervision of normal pregnancy 03/07/2015  . Previous cesarean section complicating pregnancy 03/07/2015  . S/P right oophorectomy 03/07/2015  . Hypothyroid in pregnancy, antepartum 02/14/2015  . Breast nodule  10/10/2014  . Breast pain 10/10/2014  . 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 Irregular menses 05/21/2013  . BV (bacterial vaginosis) 11/12/2012    Assessment: DEMARIA DEENEY is a 30 y.o. G2P1002 at [redacted]w[redacted]d here for IOL for cholestasis. TOLAC. EFW 65%ile on 37-week U/S  #labor: Will start Pitocin. Place Foley when able #Pain: May have Epidural when in active labor  #FWB:  Category I  #ID: GBS +. Will need PCN #MOF: breast #MOC: POPs #Circ:  Outpatient at [facility] 10/19/2015, 10:53 AM  OB FELLOW HISTORY AND PHYSICAL ATTESTATION  I have seen and examined this patient; I agree with  above documentation in the resident's note.    Jen Mow, DO OB Fellow 10/19/2015, 12:34 PM

## 2015-10-19 NOTE — Progress Notes (Signed)
LABOR PROGRESS NOTE  Vangie BickerKatelyn O Compton is a 30 y.o. G2P1002 at 702w0d  admitted for IOL for cholestasis of pregnancy.   Subjective: Patient doing well. Starting to feel contractions  Objective: BP 120/74   Pulse 80   Temp 97.7 F (36.5 C) (Axillary)   Resp 16   Ht 5\' 2"  (1.575 m)   Wt 279 lb (126.6 kg)   LMP 11/26/2014 (Approximate)   BMI 51.03 kg/m  or  Vitals:   10/19/15 1601 10/19/15 1631 10/19/15 1701 10/19/15 1730  BP: 110/71 112/62 111/72 120/74  Pulse: 75 82 72 80  Resp:   18 16  Temp:      TempSrc:      Weight:      Height:        NAD.  Dilation: 1 (closed inner os) Presentation: Vertex Exam by:: Dr. Nira Retortegele  Toco: q2-3 min FHT: 135, mod variability, +acel, no decel  Labs: Lab Results  Component Value Date   WBC 11.1 (H) 10/19/2015   HGB 12.3 10/19/2015   HCT 36.1 10/19/2015   MCV 87.6 10/19/2015   PLT 281 10/19/2015    Patient Active Problem List   Diagnosis Date Noted  . Cholestasis of pregnancy in third trimester 10/19/2015  . Intrahepatic cholestasis of pregnancy, antepartum- RESOLVED 06/05/2015  . Anxiety 05/30/2015  . Herpes labialis 05/02/2015  . Supervision of normal pregnancy 03/07/2015  . Previous cesarean section complicating pregnancy 03/07/2015  . S/P right oophorectomy 03/07/2015  . Hypothyroid in pregnancy, antepartum 02/14/2015  . Breast nodule 10/10/2014  . Breast pain 10/10/2014  . 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 Irregular menses 05/21/2013  . BV (bacterial vaginosis) 11/12/2012    Assessment / Plan: 30 y.o. G2P1002 at 1102w0d here for IOL for cholestasis of pregnancy (resolved).   Labor: Medical laboratory scientific officerContinue Pit. Place foley when able Fetal Wellbeing:  Catg 1 Pain Control:  May have epidural when in active labor Anticipated MOD:  SVD  Frederik PearJulie P Krystie Leiter, MD 10/19/2015, 6:03 PM

## 2015-10-20 MED ORDER — FENTANYL 2.5 MCG/ML BUPIVACAINE 1/10 % EPIDURAL INFUSION (WH - ANES)
14.0000 mL/h | INTRAMUSCULAR | Status: DC | PRN
  Administered 2015-10-21 (×4): 14 mL/h via EPIDURAL
  Filled 2015-10-20 (×2): qty 125

## 2015-10-20 MED ORDER — PHENYLEPHRINE 40 MCG/ML (10ML) SYRINGE FOR IV PUSH (FOR BLOOD PRESSURE SUPPORT)
80.0000 ug | PREFILLED_SYRINGE | INTRAVENOUS | Status: DC | PRN
  Filled 2015-10-20: qty 5

## 2015-10-20 MED ORDER — DIPHENHYDRAMINE HCL 50 MG/ML IJ SOLN: 12.5000 mg | INTRAMUSCULAR | Status: DC | PRN

## 2015-10-20 MED ORDER — EPHEDRINE 5 MG/ML INJ
10.0000 mg | INTRAVENOUS | Status: DC | PRN
  Filled 2015-10-20: qty 4

## 2015-10-20 MED ORDER — PENICILLIN G POTASSIUM 5000000 UNITS IJ SOLR
2.5000 10*6.[IU] | INTRAVENOUS | Status: DC
  Administered 2015-10-20 – 2015-10-21 (×3): 2.5 10*6.[IU] via INTRAVENOUS
  Filled 2015-10-20 (×6): qty 2.5

## 2015-10-20 MED ORDER — LACTATED RINGERS IV SOLN: 500.0000 mL | Freq: Once | INTRAVENOUS | Status: DC

## 2015-10-20 NOTE — Progress Notes (Signed)
LABOR PROGRESS NOTE  [name] is a 30 y.o. G2P1002 at 1668w0d  admitted for IOL for cholestasis of pregnancy.   Subjective: Patient doing well.   Objective: BP 128/75   Pulse 93   Temp 98.2 F (36.8 C) (Oral)   Resp 18   Ht 5\' 2"  (1.575 m)   Wt 279 lb (126.6 kg)   LMP 11/26/2014 (Approximate)   BMI 51.03 kg/m  or  Vitals:   10/20/15 1513 10/20/15 1531 10/20/15 1601 10/20/15 1631  BP:  99/65 109/81 128/75  Pulse:  83 87 93  Resp:  18 18 18   Temp: 98.2 F (36.8 C)     TempSrc: Oral     Weight:      Height:        NAD. Appears comfortable Dilation: 5.5 Effacement (%): 80, 90 Cervical Position: Posterior Station: Ballotable Presentation: Vertex Exam by:: Mayanna Garlitz, MD  Toco: q2-3 min FHT: 130, mod variability, +acel, no decel  Labs: Lab Results  Component Value Date   WBC 11.1 (H) 10/19/2015   HGB 12.3 10/19/2015   HCT 36.1 10/19/2015   MCV 87.6 10/19/2015   PLT 281 10/19/2015    Patient Active Problem List   Diagnosis Date Noted  . Cholestasis of pregnancy in third trimester 10/19/2015  . Intrahepatic cholestasis of pregnancy, antepartum- RESOLVED 06/05/2015  . Anxiety 05/30/2015  . Herpes labialis 05/02/2015  . Supervision of normal pregnancy 03/07/2015  . Previous cesarean section complicating pregnancy 03/07/2015  . S/P right oophorectomy 03/07/2015  . Hypothyroid in pregnancy, antepartum 02/14/2015  . Breast nodule 10/10/2014  . Breast pain 10/10/2014  . 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 Irregular menses 05/21/2013  . BV (bacterial vaginosis) 11/12/2012    Assessment / Plan: 30 y.o. G2P1002 at 8068w0d here for IOL for cholestasis of pregnancy (resolved). TOLAC  Labor: Minimal change since this morning.  Consider AROM and IUPC Fetal Wellbeing:  Category I Pain Control:  Natural. May have epidural if patient desires  Anticipated MOD:  SVD  Frederik PearJulie P Donalee Gaumond, MD 10/20/2015, 4:58 PM

## 2015-10-20 NOTE — Progress Notes (Signed)
Patient ID: Lisa BickerKatelyn O Wiley, female   DOB: 1986/01/27, 30 y.o.   MRN: [id number]  S: Patient seen & examined for progress of labor. Patient is uncomfortable during contractions and during SVE but does not want epidural.  O:   Vitals:   10/20/15 2001 10/20/15 2031 10/20/15 2114 10/20/15 2119  BP: (!) 88/64 97/69 109/70   Pulse: 86 92 78   Resp:  16  16  Temp:  98.1 F (36.7 C)  98.4 F (36.9 C)  TempSrc:  Oral  Oral  Weight:      Height:       Dilation: 5.5 Effacement (%): 60 Cervical Position: Posterior Station: Ballotable Presentation: Vertex Exam by:: Dr Omer JackMumaw   AROM performed, clear fluid.  A/P: Continue with IOL. AROM performed Continue with pitocin.

## 2015-10-20 NOTE — Progress Notes (Signed)
LABOR PROGRESS NOTE  [name] is a 30 y.o. G2P1002 at 4980w0d  admitted for IOL for cholestasis of pregnancy.   Subjective: Patient doing well. Feeling contractions, but tolerating well.   Objective: BP (!) 91/51   Pulse 97   Temp 98.4 F (36.9 C) (Oral)   Resp 18   Ht 5\' 2"  (1.575 m)   Wt 279 lb (126.6 kg)   LMP 11/26/2014 (Approximate)   BMI 51.03 kg/m  or  Vitals:   10/20/15 0700 10/20/15 0738 10/20/15 0929 10/20/15 1045  BP: (!) 85/57 100/64 (!) 94/56 (!) 91/51  Pulse: 82 88 (!) 101 97  Resp: 18 18 16 18   Temp: 98.4 F (36.9 C)     TempSrc: Oral     Weight:      Height:        NAD. Appear comfortable Dilation: 4.5 Effacement (%): 50, 40 Cervical Position: Posterior Station: Ballotable Presentation: Vertex Exam by:: Devra DoppAmber Knox, RN   Toco: q2-5 min FHT: 150, mod variability, +acel, no decel  Labs: Lab Results  Component Value Date   WBC 11.1 (H) 10/19/2015   HGB 12.3 10/19/2015   HCT 36.1 10/19/2015   MCV 87.6 10/19/2015   PLT 281 10/19/2015    Patient Active Problem List   Diagnosis Date Noted  . Cholestasis of pregnancy in third trimester 10/19/2015  . Intrahepatic cholestasis of pregnancy, antepartum- RESOLVED 06/05/2015  . Anxiety 05/30/2015  . Herpes labialis 05/02/2015  . Supervision of normal pregnancy 03/07/2015  . Previous cesarean section complicating pregnancy 03/07/2015  . S/P right oophorectomy 03/07/2015  . Hypothyroid in pregnancy, antepartum 02/14/2015  . Breast nodule 10/10/2014  . Breast pain 10/10/2014  . 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 Irregular menses 05/21/2013  . BV (bacterial vaginosis) 11/12/2012    Assessment / Plan: 30 y.o. G2P1002 at 2280w0d here for IOL for cholestasis of pregnancy (resolved).   Labor: Medical laboratory scientific officerContinue Pit.  Fetal Wellbeing:  Catg I Pain Control:  May have epidural if patient desires  Anticipated MOD:  SVD  Frederik PearJulie P Chazlyn Cude, MD 10/20/2015, 11:20 AM

## 2015-10-21 ENCOUNTER — Inpatient Hospital Stay (HOSPITAL_COMMUNITY): Payer: PRIVATE HEALTH INSURANCE | Admitting: Anesthesiology

## 2015-10-21 ENCOUNTER — Encounter (HOSPITAL_COMMUNITY): Payer: Self-pay

## 2015-10-21 DIAGNOSIS — O99284 Endocrine, nutritional and metabolic diseases complicating childbirth: Secondary | ICD-10-CM

## 2015-10-21 DIAGNOSIS — O99824 Streptococcus B carrier state complicating childbirth: Secondary | ICD-10-CM

## 2015-10-21 DIAGNOSIS — O99214 Obesity complicating childbirth: Secondary | ICD-10-CM

## 2015-10-21 DIAGNOSIS — O2662 Liver and biliary tract disorders in childbirth: Secondary | ICD-10-CM

## 2015-10-21 DIAGNOSIS — Z87891 Personal history of nicotine dependence: Secondary | ICD-10-CM

## 2015-10-21 DIAGNOSIS — K831 Obstruction of bile duct: Secondary | ICD-10-CM

## 2015-10-21 DIAGNOSIS — Z3A40 40 weeks gestation of pregnancy: Secondary | ICD-10-CM

## 2015-10-21 MED ORDER — IBUPROFEN 600 MG PO TABS
600.0000 mg | ORAL_TABLET | Freq: Four times a day (QID) | ORAL | Status: DC
  Administered 2015-10-21 – 2015-10-23 (×7): 600 mg via ORAL
  Filled 2015-10-21 (×7): qty 1

## 2015-10-21 MED ORDER — ZOLPIDEM TARTRATE 5 MG PO TABS
5.0000 mg | ORAL_TABLET | Freq: Every evening | ORAL | Status: DC | PRN
Start: 2015-10-21 — End: 2015-10-23

## 2015-10-21 MED ORDER — DIPHENHYDRAMINE HCL 25 MG PO CAPS: 25.0000 mg | ORAL_CAPSULE | Freq: Four times a day (QID) | ORAL | Status: DC | PRN

## 2015-10-21 MED ORDER — OXYCODONE HCL 5 MG PO TABS: 5.0000 mg | ORAL_TABLET | ORAL | Status: DC | PRN

## 2015-10-21 MED ORDER — GENTAMICIN SULFATE 40 MG/ML IJ SOLN
180.0000 mg | Freq: Three times a day (TID) | INTRAVENOUS | Status: DC
  Administered 2015-10-21: 180 mg via INTRAVENOUS
  Filled 2015-10-21 (×2): qty 4.5

## 2015-10-21 MED ORDER — LIDOCAINE HCL (PF) 1 % IJ SOLN
INTRAMUSCULAR | Status: DC | PRN
  Administered 2015-10-21 (×2): 4 mL

## 2015-10-21 MED ORDER — WITCH HAZEL-GLYCERIN EX PADS
1.0000 "application " | MEDICATED_PAD | CUTANEOUS | Status: DC | PRN
  Administered 2015-10-21: 1 via TOPICAL

## 2015-10-21 MED ORDER — SIMETHICONE 80 MG PO CHEW: 80.0000 mg | CHEWABLE_TABLET | ORAL | Status: DC | PRN

## 2015-10-21 MED ORDER — FENTANYL 2.5 MCG/ML BUPIVACAINE 1/10 % EPIDURAL INFUSION (WH - ANES): 14.0000 mL/h | INTRAMUSCULAR | Status: DC | PRN

## 2015-10-21 MED ORDER — DIBUCAINE 1 % RE OINT
1.0000 "application " | TOPICAL_OINTMENT | RECTAL | Status: DC | PRN
  Administered 2015-10-21: 1 via RECTAL
  Filled 2015-10-21: qty 28

## 2015-10-21 MED ORDER — FENTANYL 2.5 MCG/ML BUPIVACAINE 1/10 % EPIDURAL INFUSION (WH - ANES)
INTRAMUSCULAR | Status: AC
Start: 2015-10-21 — End: 2015-10-21
  Administered 2015-10-21: 14 mL/h via EPIDURAL
  Filled 2015-10-21: qty 125

## 2015-10-21 MED ORDER — ACETAMINOPHEN 325 MG PO TABS
650.0000 mg | ORAL_TABLET | ORAL | Status: DC | PRN
  Administered 2015-10-21 – 2015-10-22 (×3): 650 mg via ORAL
  Filled 2015-10-21 (×3): qty 2

## 2015-10-21 MED ORDER — TETANUS-DIPHTH-ACELL PERTUSSIS 5-2.5-18.5 LF-MCG/0.5 IM SUSP: 0.5000 mL | Freq: Once | INTRAMUSCULAR | Status: DC

## 2015-10-21 MED ORDER — ONDANSETRON HCL 4 MG PO TABS: 4.0000 mg | ORAL_TABLET | ORAL | Status: DC | PRN

## 2015-10-21 MED ORDER — COCONUT OIL OIL
1.0000 "application " | TOPICAL_OIL | Status: DC | PRN
  Administered 2015-10-23: 1 via TOPICAL
  Filled 2015-10-21: qty 120

## 2015-10-21 MED ORDER — ONDANSETRON HCL 4 MG/2ML IJ SOLN: 4.0000 mg | INTRAMUSCULAR | Status: DC | PRN

## 2015-10-21 MED ORDER — PRENATAL MULTIVITAMIN CH
1.0000 | ORAL_TABLET | Freq: Every day | ORAL | Status: DC
  Administered 2015-10-22: 1 via ORAL
  Filled 2015-10-21: qty 1

## 2015-10-21 MED ORDER — SODIUM CHLORIDE 0.9 % IV SOLN
1.0000 g | Freq: Four times a day (QID) | INTRAVENOUS | Status: DC
  Administered 2015-10-21: 1 g via INTRAVENOUS
  Filled 2015-10-21 (×3): qty 1000

## 2015-10-21 MED ORDER — PHENYLEPHRINE 40 MCG/ML (10ML) SYRINGE FOR IV PUSH (FOR BLOOD PRESSURE SUPPORT)
PREFILLED_SYRINGE | INTRAVENOUS | Status: AC
  Filled 2015-10-21: qty 20

## 2015-10-21 MED ORDER — SENNOSIDES-DOCUSATE SODIUM 8.6-50 MG PO TABS
2.0000 | ORAL_TABLET | ORAL | Status: DC
  Administered 2015-10-21 – 2015-10-22 (×2): 2 via ORAL
  Filled 2015-10-21 (×2): qty 2

## 2015-10-21 MED ORDER — BENZOCAINE-MENTHOL 20-0.5 % EX AERO
1.0000 "application " | INHALATION_SPRAY | CUTANEOUS | Status: DC | PRN
  Administered 2015-10-21: 1 via TOPICAL
  Filled 2015-10-21: qty 56

## 2015-10-21 NOTE — Anesthesia Procedure Notes (Signed)
Epidural Patient location during procedure: OB  Staffing Anesthesiologist: Lewie LoronGERMEROTH, Jatorian Renault Performed: anesthesiologist   Preanesthetic Checklist Completed: patient identified, pre-op evaluation, timeout performed, IV checked, risks and benefits discussed and monitors and equipment checked  Epidural Patient position: sitting Prep: site prepped and draped and DuraPrep Patient monitoring: heart rate Approach: midline Location: L2-L3 Injection technique: LOR air and LOR saline  Needle:  Needle type: Tuohy  Needle gauge: 17 G Needle length: 9 cm Needle insertion depth: 9 cm Catheter type: closed end flexible Catheter size: 19 Gauge Catheter at skin depth: 15 cm Test dose: negative  Assessment Sensory level: T8 Events: blood not aspirated, injection not painful, no injection resistance, negative IV test and no paresthesia  Additional Notes Reason for block:procedure for pain

## 2015-10-21 NOTE — Progress Notes (Signed)
Patient ID: Lisa BickerKatelyn O Wiley, female   DOB: 07/22/1985, 30 y.o.   MRN: [id number]  S: Patient seen & examined for progress of labor. Patient is laying asleep in the room, with epidural anesthesia.   O:  Vitals:   10/21/15 0301 10/21/15 0331 10/21/15 0401 10/21/15 0431  BP: (!) 82/53 (!) 88/53 (!) 92/48 (!) 98/52  Pulse: 81 89 84 90  Resp:      Temp:      TempSrc:      SpO2:      Weight:      Height:       Dilation: 5 Effacement (%): 80 Cervical Position: Posterior Station: -2 Presentation: Vertex Exam by:: dr Omer Jackmumaw  IUPC placed with ease. Contractions are inadequate (150 MVU). Patient repositioned with peanut ball.  FHT: 130, mod var, +accels, no decels IUPC: 150 MVU, contracting q2-3 min.  A/P: 30 y.o. G2P1002 1781w2d here for IOL for cholestasis.  IUPC placed, contractions are inadequate Continue increasing pitocin Anticipate SVD

## 2015-10-21 NOTE — Anesthesia Postprocedure Evaluation (Signed)
Anesthesia Post Note  Patient: [name]  Procedure(s) Performed: * No procedures listed *  Patient location during evaluation: Mother Baby Anesthesia Type: Epidural Level of consciousness: awake and alert Pain management: pain level controlled Vital Signs Assessment: post-procedure vital signs reviewed and stable Respiratory status: spontaneous breathing, nonlabored ventilation and respiratory function stable Cardiovascular status: stable Postop Assessment: no headache, no backache and epidural receding Anesthetic complications: no     Last Vitals:  Vitals:   10/21/15 1600 10/21/15 1700  BP: 113/65 97/60  Pulse: 81 93  Resp: 20 20  Temp: 37.6 C 36.8 C    Last Pain:  Vitals:   10/21/15 1700  TempSrc: Axillary  PainSc: 0-No pain   Pain Goal: Patients Stated Pain Goal: 7 (10/20/15 1227)               [name],[name]

## 2015-10-21 NOTE — Anesthesia Preprocedure Evaluation (Addendum)
Anesthesia Evaluation  Patient identified by MRN, date of birth, ID band Patient awake    Reviewed: Allergy & Precautions, H&P , NPO status , Patient's Chart, lab work & pertinent test results  Airway Mallampati: III  TM Distance: >3 FB   Mouth opening: Limited Mouth Opening  Dental  (+) Teeth Intact   Pulmonary neg pulmonary ROS, former smoker,    breath sounds clear to auscultation       Cardiovascular negative cardio ROS   Rhythm:Regular     Neuro/Psych  Headaches,    GI/Hepatic negative GI ROS,   Endo/Other  Hypothyroidism Morbid obesity  Renal/GU      Musculoskeletal   Abdominal   Peds  Hematology   Anesthesia Other Findings   Reproductive/Obstetrics                             Anesthesia Physical  Anesthesia Plan  ASA: III  Anesthesia Plan: Epidural   Post-op Pain Management:    Induction:   Airway Management Planned:   Additional Equipment:   Intra-op Plan:   Post-operative Plan:   Informed Consent: I have reviewed the patients History and Physical, chart, labs and discussed the procedure including the risks, benefits and alternatives for the proposed anesthesia with the patient or authorized representative who has indicated his/her understanding and acceptance.     Plan Discussed with:   Anesthesia Plan Comments:        Anesthesia Quick Evaluation

## 2015-10-21 NOTE — Progress Notes (Signed)
ANTIBIOTIC CONSULT NOTE - INITIAL  Pharmacy Consult for Gentamicin Indication: Chorioamnionitis  Allergies  Allergen Reactions  . Imitrex [Sumatriptan]     Makes migraines worse.   . Maxalt [Rizatriptan]     Makes migraines worse  . Phenergan [Promethazine Hcl] Nausea And Vomiting  . Tape Other (See Comments)    Blisters on abdomen after C Section    Patient Measurements: Height: 5\' 2"  (157.5 cm) Weight: 279 lb (126.6 kg) IBW/kg (Calculated) : 50.1 Adjusted Body Weight: 73 kg  Vital Signs: Temp: 100.6 F (38.1 C) (09/09 1131) Temp Source: Axillary (09/09 1131) BP: 121/90 (09/09 1131) Pulse Rate: 92 (09/09 1131)  Labs:  Recent Labs  10/19/15 0755  WBC 11.1*  HGB 12.3  PLT 281     Medications:  Ampicillin 1g IV q6h  Assessment: 30 y.o. female G2P1002 at 5017w2d being initiated on ampicillin and gentamicin for Triple I/chorio.   Estimated Ke =0.287, Vd = 29.2L  Goal of Therapy:  Gentamicin 180 mg IV every 8 hrs  Check Scr with next labs if gentamicin continued. Will check gentamicin levels if continued > 72hr or clinically indicated.  Vincent GrosHolcombe, Bobbie Virden [place] 10/21/2015,12:10 PM

## 2015-10-22 NOTE — Lactation Note (Signed)
This note was copied from a baby's chart. Lactation Consultation Note  22 hours. Breastfed twins for one month. Mother states baby has been spitty and had difficulty sustaining latch. Left lactation phone number and suggest she call to assist w/ next feeding. Mom made aware of O/P services, breastfeeding support groups, community resources, and our phone # for post-discharge questions.    Patient Name: Lisa Miguel DibbleKatelyn Compton WUJWJ'XToday's Date: 10/22/2015 Reason for consult: Initial assessment   Maternal Data Does the patient have breastfeeding experience prior to this delivery?: Yes  Feeding Feeding Type: Breast Fed Length of feed: 10 min  LATCH Score/Interventions                      Lactation Tools Discussed/Used     Consult Status Consult Status: Follow-up Date: 10/23/15 Follow-up type: In-patient    Dahlia ByesBerkelhammer, [facility] 10/22/2015, 11:40 AM

## 2015-10-22 NOTE — Progress Notes (Signed)
Post Partum Day 1 Subjective: no complaints, up ad lib, voiding and tolerating PO  Objective: Blood pressure 96/61, pulse (!) 59, temperature 97.5 F (36.4 C), temperature source Oral, resp. rate 18, height 5\' 2"  (1.575 m), weight 279 lb (126.6 kg), last menstrual period 11/26/2014, SpO2 98 %, unknown if currently breastfeeding.  Physical Exam:  General: alert, cooperative, appears stated age and no distress Lochia: appropriate Uterine Fundus: firm Incision: n/a DVT Evaluation: No evidence of DVT seen on physical exam. Negative Homan's sign. No cords or calf tenderness.  No results for input(s): HGB, HCT in the last 72 hours.  Assessment/Plan: Plan for discharge tomorrow   LOS: 3 days   [name] 10/22/2015, 9:20 AM

## 2015-10-22 NOTE — Progress Notes (Signed)
CSW met with pt/FOB to discuss her hx of anxiety.  Pt confirmed that she had some anxiety while pregnant with first child, but reports that she is currently feeling "fine."  Pt takes no medications and does not participate in therapy.  Pt/FOB educated on PPD and pt agreeable to f/u with her MD for any changes in mood/behavior, post d/c.  No other social work needs identified.  CSW signing off.  Please re-consult as necessary.   Creta Levin, LCSW Weekend Coverage [phone]

## 2015-10-22 NOTE — Lactation Note (Signed)
This note was copied from a baby's chart. Lactation Consultation Note  Family called for assistance w/ latching. Reviewed hand expression and drops expressed. Baby latched in football hold.  Flanged lips and mother had improved comfort. Taught her to massage breast forward instead of pulling tissue back. Swallows observed when mother compressed breast. Discussed breastfeeding on both breasts per feeding.  Patient Name: Lisa Miguel DibbleKatelyn Wiley ZOXWR'UToday's Date: 10/22/2015 Reason for consult: Follow-up assessment   Maternal Data Does the patient have breastfeeding experience prior to this delivery?: Yes  Feeding Feeding Type: Breast Fed Length of feed: 10 min  LATCH Score/Interventions Latch: Grasps breast easily, tongue down, lips flanged, rhythmical sucking. Intervention(s): Skin to skin;Teach feeding cues;Waking techniques  Audible Swallowing: A few with stimulation Intervention(s): Hand expression  Type of Nipple: Everted at rest and after stimulation  Comfort (Breast/Nipple): Soft / non-tender     Hold (Positioning): Assistance needed to correctly position infant at breast and maintain latch.  LATCH Score: 8  Lactation Tools Discussed/Used     Consult Status Consult Status: Follow-up Date: 10/23/15 Follow-up type: In-patient    Dahlia ByesBerkelhammer, Ruth [facility] 10/22/2015, 12:47 PM

## 2015-10-23 MED ORDER — NORETHINDRONE 0.35 MG PO TABS: 1.0000 | ORAL_TABLET | Freq: Every day | ORAL | 11 refills | Status: DC

## 2015-10-23 MED ORDER — HYDROCORTISONE 1 % EX CREA
TOPICAL_CREAM | Freq: Three times a day (TID) | CUTANEOUS | Status: DC
  Administered 2015-10-23 (×2): via TOPICAL
  Filled 2015-10-23: qty 28

## 2015-10-23 MED ORDER — IBUPROFEN 600 MG PO TABS: 600.0000 mg | ORAL_TABLET | Freq: Four times a day (QID) | ORAL | 0 refills | Status: DC

## 2015-10-23 NOTE — Lactation Note (Signed)
This note was copied from a baby's chart. Lactation Consultation Note; Mom reports nipples are very sore and she has been pumping and bottle feeding formula. Reports she is only obtaining a few cc's when she pumps. Encouragement given. Suggested rubbing EBM into nipples after nursing or pumping. Baby just had formula. Plans to borrow pump from a friend . Will also call insurance company about a pump. Reviewed importance of frequent pumping to promote a good milk supply. States she plans to put baby back to breast when nipples heal. Offered OP appointment but mom refused states she will call if needs one. No questions at present To call prn  Patient Name: Lisa Miguel DibbleKatelyn Wiley JYNWG'NToday's Date: 10/23/2015 Reason for consult: Follow-up assessment   Maternal Data Formula Feeding for Exclusion: No Does the patient have breastfeeding experience prior to this delivery?: Yes  Feeding Feeding Type: Formula  LATCH Score/Interventions       Type of Nipple: Everted at rest and after stimulation  Comfort (Breast/Nipple): Filling, red/small blisters or bruises, mild/mod discomfort  Problem noted: Mild/Moderate discomfort Interventions (Mild/moderate discomfort): Hand expression        Lactation Tools Discussed/Used WIC Program: No   Consult Status Consult Status: Complete    Pamelia HoitWeeks, Daison Braxton D 10/23/2015, 12:25 PM

## 2015-10-23 NOTE — Lactation Note (Signed)
This note was copied from a baby's chart. Lactation Consultation Note Mom sleeping, FOB holding baby.  Patient Name: Lisa Miguel DibbleKatelyn Wiley [id number]'VToday's Date: 10/23/2015     Maternal Data    Feeding Feeding Type: Breast Fed Length of feed: 7 min  LATCH Score/Interventions                      Lactation Tools Discussed/Used     Consult Status      [name] 10/23/2015, 3:45 AM

## 2015-10-23 NOTE — Progress Notes (Signed)
Patient with red rash on back from tape removal. Dr. Nira Retortegele notified. Order for hydrocortisone TID

## 2015-10-23 NOTE — Discharge Summary (Signed)
OB Discharge Summary  Patient Name: Lisa BickerKatelyn O Wiley DOB: 10-24-1985 MRN: [id number]  Date of admission: 10/19/2015 Delivering MD: Lorne SkeensSCHENK, NICHOLAS MICHAEL   Date of discharge: 10/23/2015  Admitting diagnosis: induction Intrauterine pregnancy: 6581w2d     Secondary diagnosis:Active Problems:   Cholestasis of pregnancy in third trimester  Additional problems:triple I     Discharge diagnosis: Term Pregnancy Delivered                                                                     Post partum procedures:n/a  Augmentation: Pitocin  Complications: Intrauterine Inflammation or infection (Chorioamniotis)  Hospital course:  Onset of Labor With Vaginal Delivery     30 y.o. yo G2P2003 at 5081w2d was admitted in Latent Labor on 10/19/2015. Patient had an uncomplicated labor course as follows:  Membrane Rupture Time/Date: 9:19 PM ,10/20/2015   Intrapartum Procedures: Episiotomy: None [1]                                         Lacerations:  2nd degree [3];Perineal [11]  Patient had a delivery of a Viable infant. 10/21/2015  Information for the patient's newborn:  Yong ChannelCompton, Boy Monta [[id number]][[id number]]  Delivery Method: Vag-Spont    Pateint had an uncomplicated postpartum course.  She is ambulating, tolerating a regular diet, passing flatus, and urinating well. Patient is discharged home in stable condition on 10/23/15.    Physical exam Vitals:   10/21/15 0050 10/22/15 0520 10/22/15 1733 10/23/15 0541  BP:  96/61 109/62 116/76  Pulse:  (!) 59 69 71  Resp:  18 18 18   Temp:  97.5 F (36.4 C) 98.2 F (36.8 C) 98.3 F (36.8 C)  TempSrc:  Oral Oral Oral  SpO2: 98%     Weight:      Height:       General: alert, cooperative and no distress Lochia: appropriate Uterine Fundus: firm Incision: N/A DVT Evaluation: No evidence of DVT seen on physical exam. Negative Homan's sign. Labs: Lab Results  Component Value Date   WBC 11.1 (H) 10/19/2015   HGB 12.3 10/19/2015   HCT 36.1  10/19/2015   MCV 87.6 10/19/2015   PLT 281 10/19/2015   CMP Latest Ref Rng & Units 06/28/2015  Glucose 65 - 99 mg/dL 82  BUN 6 - 20 mg/dL 6  Creatinine 6.640.57 - 4.031.00 mg/dL 4.74(Q0.43(L)  Sodium 595134 - 638144 mmol/L 138  Potassium 3.5 - 5.2 mmol/L 3.9  Chloride 96 - 106 mmol/L 104  CO2 18 - 29 mmol/L 19  Calcium 8.7 - 10.2 mg/dL 7.5(I8.4(L)  Total Protein 6.0 - 8.5 g/dL 5.9(L)  Total Bilirubin 0.0 - 1.2 mg/dL 0.3  Alkaline Phos 39 - 117 IU/L 170(H)  AST 0 - 40 IU/L 13  ALT 0 - 32 IU/L 13    Discharge instruction: per After Visit Summary and "Baby and Me Booklet".    Diet: routine diet  Activity: Advance as tolerated. Pelvic rest for 6 weeks.   Outpatient follow up:6 weeks Follow up Appt:Future Appointments Date Time Provider Department Center  11/23/2015 10:15 AM Lazaro ArmsLuther H Eure, MD [facility]   Follow up  visit: No Follow-up on file.  Postpartum contraception: Progesterone only pills  Newborn Data: Live born female  Birth Weight: 7 lb 13 oz (3545 g) APGAR: 9, 9  Baby Feeding: Breast Disposition:home with mother   10/23/2015 Wyvonnia Dusky, CNM

## 2015-11-23 ENCOUNTER — Ambulatory Visit: Payer: PRIVATE HEALTH INSURANCE | Admitting: Obstetrics & Gynecology

## 2015-11-27 ENCOUNTER — Ambulatory Visit (INDEPENDENT_AMBULATORY_CARE_PROVIDER_SITE_OTHER): Payer: PRIVATE HEALTH INSURANCE | Admitting: Obstetrics & Gynecology

## 2015-11-27 ENCOUNTER — Encounter: Payer: Self-pay | Admitting: Obstetrics & Gynecology

## 2015-11-27 DIAGNOSIS — Z3202 Encounter for pregnancy test, result negative: Secondary | ICD-10-CM | POA: Diagnosis not present

## 2015-11-27 LAB — POCT URINE PREGNANCY: Preg Test, Ur: NEGATIVE

## 2015-11-27 MED ORDER — DESOGESTREL-ETHINYL ESTRADIOL 0.15-30 MG-MCG PO TABS
1.0000 | ORAL_TABLET | Freq: Every day | ORAL | 11 refills | Status: DC
Start: 2015-11-27 — End: 2017-06-09

## 2015-11-27 NOTE — Progress Notes (Signed)
Subjective:     [name] is a 30 y.o. female who presents for a postpartum visit. She is 6 weeks postpartum following a spontaneous vaginal delivery. I have fully reviewed the prenatal and intrapartum course. The delivery was at 40 gestational weeks. Outcome: spontaneous vaginal delivery. Anesthesia: epidural. Postpartum course has been normal. Baby's course has been normal. Baby is feeding by bottle. Bleeding no bleeding. Bowel function is normal. Bladder function is normal. Patient is not sexually active. Contraception method is oral progesterone-only contraceptive. Postpartum depression screening: negative.  The following portions of the patient's history were reviewed and updated as appropriate: allergies, current medications, past family history, past medical history, past social history, past surgical history and problem list.  Review of Systems Pertinent items are noted in HPI.   Objective:    BP 100/78 (BP Location: Left Arm, Patient Position: Sitting, Cuff Size: Large)   Pulse 64   Wt 255 lb (115.7 kg)   Breastfeeding? No   BMI 46.64 kg/m   General:  alert, cooperative and no distress   Breasts:    Lungs:   Heart:    Abdomen: soft, non-tender; bowel sounds normal; no masses,  no organomegaly   Vulva:  normal  Vagina: normal vagina  Cervix:  no cervical motion tenderness and no lesions  Corpus: normal size, contour, position, consistency, mobility, non-tender  Adnexa:  normal adnexa  Rectal Exam:         Assessment:     normal postpartum exam. Pap smear done at today's visit.   Plan:    1. Contraception: oral progesterone-only contraceptive 2. Wants to switch to combination pill, no longer breast feeding  Meds ordered this encounter  Medications  . desogestrel-ethinyl estradiol (APRI,EMOQUETTE,SOLIA) 0.15-30 MG-MCG tablet    Sig: Take 1 tablet by mouth daily.    Dispense:  1 Package    Refill:  11    3. Follow up in: 6 months or as needed.

## 2015-12-01 ENCOUNTER — Telehealth: Payer: Self-pay | Admitting: *Deleted

## 2015-12-01 ENCOUNTER — Telehealth: Payer: Self-pay | Admitting: Obstetrics & Gynecology

## 2015-12-01 NOTE — Telephone Encounter (Signed)
Pt requesting a return to work note for today, postpartum visit 11/27/2015, SVD 10/21/2015. Note left at front desk for pick up.

## 2015-12-01 NOTE — Telephone Encounter (Signed)
Called and left message to let patient know work note was completed by Dr Despina HiddenEure and left at front desk to be picked up before office hours end at 2pm today.

## 2016-05-27 ENCOUNTER — Ambulatory Visit: Payer: PRIVATE HEALTH INSURANCE | Admitting: Obstetrics & Gynecology

## 2017-02-11 NOTE — L&D Delivery Note (Signed)
[name] is a 32 y.o. female 463P2003 with IUP at 6368w0d admitted for spontaneous onset of labor.  She progressed withaugmentation to complete and pushed 3 times to deliver.  Cord clamping delayed by several minutes then clamped by CNM and cut by FOB.    Delivery Note At 1:38 AM a viable female was delivered via Vaginal, Spontaneous (Presentation: LOA).  APGAR: 9, 9; weight pending.   Placenta status: spontaneous, intact.  Cord: 3 vessels  Anesthesia:  Epidural, lidocaine Episiotomy:  none Lacerations:  2nd degree Suture Repair: 3.0 vicryl Est. Blood Loss (mL):    Mom to postpartum.  Baby to Couplet care / Skin to Skin.  Rolm BookbinderCaroline M Neill CNM 02/01/2018, 1:59 AM

## 2017-04-28 ENCOUNTER — Telehealth: Payer: Self-pay | Admitting: Obstetrics & Gynecology

## 2017-04-28 NOTE — Telephone Encounter (Signed)
Left message x 1. JSY 

## 2017-04-29 ENCOUNTER — Encounter: Payer: Self-pay | Admitting: "Endocrinology

## 2017-04-29 NOTE — Telephone Encounter (Signed)
Left message x 2. JSY 

## 2017-04-30 NOTE — Telephone Encounter (Signed)
Spoke with pt. She saw PCP yesterday for discharge. Encounter closed. JSY

## 2017-06-09 ENCOUNTER — Encounter: Payer: Self-pay | Admitting: Adult Health

## 2017-06-09 ENCOUNTER — Encounter (INDEPENDENT_AMBULATORY_CARE_PROVIDER_SITE_OTHER): Payer: Self-pay

## 2017-06-09 ENCOUNTER — Ambulatory Visit: Payer: Commercial Managed Care - PPO | Admitting: Adult Health

## 2017-06-09 VITALS — BP 98/60 | HR 100 | Resp 14 | Ht 63.0 in | Wt 298.0 lb

## 2017-06-09 DIAGNOSIS — R112 Nausea with vomiting, unspecified: Secondary | ICD-10-CM | POA: Diagnosis not present

## 2017-06-09 DIAGNOSIS — Z3201 Encounter for pregnancy test, result positive: Secondary | ICD-10-CM | POA: Diagnosis not present

## 2017-06-09 DIAGNOSIS — O3680X0 Pregnancy with inconclusive fetal viability, not applicable or unspecified: Secondary | ICD-10-CM

## 2017-06-09 DIAGNOSIS — N926 Irregular menstruation, unspecified: Secondary | ICD-10-CM | POA: Diagnosis not present

## 2017-06-09 DIAGNOSIS — Z3A01 Less than 8 weeks gestation of pregnancy: Secondary | ICD-10-CM

## 2017-06-09 LAB — POCT URINE PREGNANCY: Preg Test, Ur: POSITIVE — AB

## 2017-06-09 MED ORDER — OB COMPLETE PETITE 35-5-1-200 MG PO CAPS: 1.0000 | ORAL_CAPSULE | Freq: Every day | ORAL | 12 refills | Status: DC

## 2017-06-09 NOTE — Patient Instructions (Signed)
First Trimester of Pregnancy The first trimester of pregnancy is from week 1 until the end of week 13 (months 1 through 3). A week after a sperm fertilizes an egg, the egg will implant on the wall of the uterus. This embryo will begin to develop into a baby. Genes from you and your partner will form the baby. The female genes will determine whether the baby will be a boy or a girl. At 6-8 weeks, the eyes and face will be formed, and the heartbeat can be seen on ultrasound. At the end of 12 weeks, all the baby's organs will be formed. Now that you are pregnant, you will want to do everything you can to have a healthy baby. Two of the most important things are to get good prenatal care and to follow your health care provider's instructions. Prenatal care is all the medical care you receive before the baby's birth. This care will help prevent, find, and treat any problems during the pregnancy and childbirth. Body changes during your first trimester Your body goes through many changes during pregnancy. The changes vary from woman to woman.  You may gain or lose a couple of pounds at first.  You may feel sick to your stomach (nauseous) and you may throw up (vomit). If the vomiting is uncontrollable, call your health care provider.  You may tire easily.  You may develop headaches that can be relieved by medicines. All medicines should be approved by your health care provider.  You may urinate more often. Painful urination may mean you have a bladder infection.  You may develop heartburn as a result of your pregnancy.  You may develop constipation because certain hormones are causing the muscles that push stool through your intestines to slow down.  You may develop hemorrhoids or swollen veins (varicose veins).  Your breasts may begin to grow larger and become tender. Your nipples may stick out more, and the tissue that surrounds them (areola) may become darker.  Your gums may bleed and may be  sensitive to brushing and flossing.  Dark spots or blotches (chloasma, mask of pregnancy) may develop on your face. This will likely fade after the baby is born.  Your menstrual periods will stop.  You may have a loss of appetite.  You may develop cravings for certain kinds of food.  You may have changes in your emotions from day to day, such as being excited to be pregnant or being concerned that something may go wrong with the pregnancy and baby.  You may have more vivid and strange dreams.  You may have changes in your hair. These can include thickening of your hair, rapid growth, and changes in texture. Some women also have hair loss during or after pregnancy, or hair that feels dry or thin. Your hair will most likely return to normal after your baby is born.  What to expect at prenatal visits During a routine prenatal visit:  You will be weighed to make sure you and the baby are growing normally.  Your blood pressure will be taken.  Your abdomen will be measured to track your baby's growth.  The fetal heartbeat will be listened to between weeks 10 and 14 of your pregnancy.  Test results from any previous visits will be discussed.  Your health care provider may ask you:  How you are feeling.  If you are feeling the baby move.  If you have had any abnormal symptoms, such as leaking fluid, bleeding, severe headaches,   or abdominal cramping.  If you are using any tobacco products, including cigarettes, chewing tobacco, and electronic cigarettes.  If you have any questions.  Other tests that may be performed during your first trimester include:  Blood tests to find your blood type and to check for the presence of any previous infections. The tests will also be used to check for low iron levels (anemia) and protein on red blood cells (Rh antibodies). Depending on your risk factors, or if you previously had diabetes during pregnancy, you may have tests to check for high blood  sugar that affects pregnant women (gestational diabetes).  Urine tests to check for infections, diabetes, or protein in the urine.  An ultrasound to confirm the proper growth and development of the baby.  Fetal screens for spinal cord problems (spina bifida) and Down syndrome.  HIV (human immunodeficiency virus) testing. Routine prenatal testing includes screening for HIV, unless you choose not to have this test.  You may need other tests to make sure you and the baby are doing well.  Follow these instructions at home: Medicines  Follow your health care provider's instructions regarding medicine use. Specific medicines may be either safe or unsafe to take during pregnancy.  Take a prenatal vitamin that contains at least 600 micrograms (mcg) of folic acid.  If you develop constipation, try taking a stool softener if your health care provider approves. Eating and drinking  Eat a balanced diet that includes fresh fruits and vegetables, whole grains, good sources of protein such as meat, eggs, or tofu, and low-fat dairy. Your health care provider will help you determine the amount of weight gain that is right for you.  Avoid raw meat and uncooked cheese. These carry germs that can cause birth defects in the baby.  Eating four or five small meals rather than three large meals a day may help relieve nausea and vomiting. If you start to feel nauseous, eating a few soda crackers can be helpful. Drinking liquids between meals, instead of during meals, also seems to help ease nausea and vomiting.  Limit foods that are high in fat and processed sugars, such as fried and sweet foods.  To prevent constipation: ? Eat foods that are high in fiber, such as fresh fruits and vegetables, whole grains, and beans. ? Drink enough fluid to keep your urine clear or pale yellow. Activity  Exercise only as directed by your health care provider. Most women can continue their usual exercise routine during  pregnancy. Try to exercise for 30 minutes at least 5 days a week. Exercising will help you: ? Control your weight. ? Stay in shape. ? Be prepared for labor and delivery.  Experiencing pain or cramping in the lower abdomen or lower back is a good sign that you should stop exercising. Check with your health care provider before continuing with normal exercises.  Try to avoid standing for long periods of time. Move your legs often if you must stand in one place for a long time.  Avoid heavy lifting.  Wear low-heeled shoes and practice good posture.  You may continue to have sex unless your health care provider tells you not to. Relieving pain and discomfort  Wear a good support bra to relieve breast tenderness.  Take warm sitz baths to soothe any pain or discomfort caused by hemorrhoids. Use hemorrhoid cream if your health care provider approves.  Rest with your legs elevated if you have leg cramps or low back pain.  If you develop   varicose veins in your legs, wear support hose. Elevate your feet for 15 minutes, 3-4 times a day. Limit salt in your diet. Prenatal care  Schedule your prenatal visits by the twelfth week of pregnancy. They are usually scheduled monthly at first, then more often in the last 2 months before delivery.  Write down your questions. Take them to your prenatal visits.  Keep all your prenatal visits as told by your health care provider. This is important. Safety  Wear your seat belt at all times when driving.  Make a list of emergency phone numbers, including numbers for family, friends, the hospital, and police and fire departments. General instructions  Ask your health care provider for a referral to a local prenatal education class. Begin classes no later than the beginning of month 6 of your pregnancy.  Ask for help if you have counseling or nutritional needs during pregnancy. Your health care provider can offer advice or refer you to specialists for help  with various needs.  Do not use hot tubs, steam rooms, or saunas.  Do not douche or use tampons or scented sanitary pads.  Do not cross your legs for long periods of time.  Avoid cat litter boxes and soil used by cats. These carry germs that can cause birth defects in the baby and possibly loss of the fetus by miscarriage or stillbirth.  Avoid all smoking, herbs, alcohol, and medicines not prescribed by your health care provider. Chemicals in these products affect the formation and growth of the baby.  Do not use any products that contain nicotine or tobacco, such as cigarettes and e-cigarettes. If you need help quitting, ask your health care provider. You may receive counseling support and other resources to help you quit.  Schedule a dentist appointment. At home, brush your teeth with a soft toothbrush and be gentle when you floss. Contact a health care provider if:  You have dizziness.  You have mild pelvic cramps, pelvic pressure, or nagging pain in the abdominal area.  You have persistent nausea, vomiting, or diarrhea.  You have a bad smelling vaginal discharge.  You have pain when you urinate.  You notice increased swelling in your face, hands, legs, or ankles.  You are exposed to fifth disease or chickenpox.  You are exposed to German measles (rubella) and have never had it. Get help right away if:  You have a fever.  You are leaking fluid from your vagina.  You have spotting or bleeding from your vagina.  You have severe abdominal cramping or pain.  You have rapid weight gain or loss.  You vomit blood or material that looks like coffee grounds.  You develop a severe headache.  You have shortness of breath.  You have any kind of trauma, such as from a fall or a car accident. Summary  The first trimester of pregnancy is from week 1 until the end of week 13 (months 1 through 3).  Your body goes through many changes during pregnancy. The changes vary from  woman to woman.  You will have routine prenatal visits. During those visits, your health care provider will examine you, discuss any test results you may have, and talk with you about how you are feeling. This information is not intended to replace advice given to you by your health care provider. Make sure you discuss any questions you have with your health care provider. Document Released: 01/22/2001 Document Revised: 01/10/2016 Document Reviewed: 01/10/2016 Elsevier Interactive Patient Education  2018 [employer]   Inc[employer]  

## 2017-06-09 NOTE — Progress Notes (Signed)
  Subjective:     Patient ID: [name], female   DOB: May 14, 1985, 32 y.o.   MRN: [id number]  HPI [name] is a 32 year old white female in for UPT,has missed a period and had 3+HPTs.   Review of Systems +missed period, with 3+HPT +nasuea and some vomiting Pain RLQ at times, none today     Objective:   Physical Exam BP 98/60 (BP Location: Left Arm, Patient Position: Sitting, Cuff Size: Large)   Pulse 100   Resp 14   Ht  (1.6 m)   Wt 298 lb (135.2 kg)   LMP 04/27/2017 (Exact Date)   BMI 52.79 kg/m UPT +, about 6+1 week, by LMP with EDD 02/01/18.Skin warm and dry. Neck: mid line trachea, normal thyroid, good ROM, no lymphadenopathy noted. Lungs: clear to ausculation bilaterally. Cardiovascular: regular rate and rhythm. Abdomen is soft and non tender.     Assessment:     1. Pregnancy test positive   2. Less than [redacted] weeks gestation of pregnancy   3. Encounter to determine fetal viability of pregnancy, single or unspecified fetus       Plan:    Eat often Can take tylenol for pain  Meds ordered this encounter  Medications  . Prenat-FeCbn-FeAspGl-FA-Omega (OB COMPLETE PETITE) 35-5-1-200 MG CAPS    Sig: Take 1 capsule by mouth daily.    Dispense:  30 capsule    Refill:  12    Order Specific Question:   Supervising Provider    Answer:   Lazaro Arms [[id number]]  Return in 1 week for dating [place] Review handouts on First trimester and by [facility]

## 2017-06-16 ENCOUNTER — Ambulatory Visit (INDEPENDENT_AMBULATORY_CARE_PROVIDER_SITE_OTHER): Payer: Commercial Managed Care - PPO

## 2017-06-16 DIAGNOSIS — Z3A01 Less than 8 weeks gestation of pregnancy: Secondary | ICD-10-CM | POA: Diagnosis not present

## 2017-06-16 DIAGNOSIS — O3680X0 Pregnancy with inconclusive fetal viability, not applicable or unspecified: Secondary | ICD-10-CM

## 2017-06-16 NOTE — Progress Notes (Signed)
[place] 6+1 wks,single IUP w/ys,fhr 135 bpm,CRL 4.2 mm,normal ovaries bilat

## 2017-06-19 ENCOUNTER — Other Ambulatory Visit: Payer: Commercial Managed Care - PPO

## 2017-06-19 ENCOUNTER — Telehealth: Payer: Self-pay | Admitting: *Deleted

## 2017-06-19 DIAGNOSIS — R300 Dysuria: Secondary | ICD-10-CM

## 2017-06-19 MED ORDER — CEPHALEXIN 500 MG PO CAPS: 500.0000 mg | ORAL_CAPSULE | Freq: Four times a day (QID) | ORAL | 0 refills | Status: DC

## 2017-06-19 NOTE — Telephone Encounter (Addendum)
Patient states she is having burning with urination, frequency and lower abdominal pain.  Advised to come so we can dip urine.  Verbalized understanding.

## 2017-06-19 NOTE — Telephone Encounter (Signed)
Per RN:   Patient states she is having burning with urination, frequency and lower abdominal pain.  Advised to come so we can dip urine.  Verbalized understanding.   Rx keflex, send urine cx.  Cheral Marker, CNM, [facility] 06/19/2017 5:11 PM

## 2017-06-19 NOTE — Addendum Note (Signed)
Addended by: Cheral Marker on: 06/19/2017 05:11 PM   Modules accepted: Orders

## 2017-06-21 LAB — URINE CULTURE

## 2017-06-23 ENCOUNTER — Encounter: Payer: Self-pay | Admitting: Women's Health

## 2017-06-23 DIAGNOSIS — O2341 Unspecified infection of urinary tract in pregnancy, first trimester: Secondary | ICD-10-CM | POA: Insufficient documentation

## 2017-06-27 ENCOUNTER — Encounter: Payer: Self-pay | Admitting: "Endocrinology

## 2017-07-02 ENCOUNTER — Encounter: Payer: Self-pay | Admitting: Advanced Practice Midwife

## 2017-07-02 ENCOUNTER — Ambulatory Visit (INDEPENDENT_AMBULATORY_CARE_PROVIDER_SITE_OTHER): Payer: Commercial Managed Care - PPO | Admitting: Advanced Practice Midwife

## 2017-07-02 ENCOUNTER — Ambulatory Visit: Payer: Commercial Managed Care - PPO | Admitting: *Deleted

## 2017-07-02 VITALS — BP 92/70 | HR 65 | Wt 299.0 lb

## 2017-07-02 DIAGNOSIS — Z98891 History of uterine scar from previous surgery: Secondary | ICD-10-CM

## 2017-07-02 DIAGNOSIS — Z3490 Encounter for supervision of normal pregnancy, unspecified, unspecified trimester: Secondary | ICD-10-CM | POA: Insufficient documentation

## 2017-07-02 DIAGNOSIS — Z331 Pregnant state, incidental: Secondary | ICD-10-CM

## 2017-07-02 DIAGNOSIS — O99280 Endocrine, nutritional and metabolic diseases complicating pregnancy, unspecified trimester: Secondary | ICD-10-CM

## 2017-07-02 DIAGNOSIS — O09291 Supervision of pregnancy with other poor reproductive or obstetric history, first trimester: Secondary | ICD-10-CM

## 2017-07-02 DIAGNOSIS — O99281 Endocrine, nutritional and metabolic diseases complicating pregnancy, first trimester: Secondary | ICD-10-CM

## 2017-07-02 DIAGNOSIS — E039 Hypothyroidism, unspecified: Secondary | ICD-10-CM

## 2017-07-02 DIAGNOSIS — O34219 Maternal care for unspecified type scar from previous cesarean delivery: Secondary | ICD-10-CM

## 2017-07-02 DIAGNOSIS — Z3A08 8 weeks gestation of pregnancy: Secondary | ICD-10-CM

## 2017-07-02 DIAGNOSIS — Z1389 Encounter for screening for other disorder: Secondary | ICD-10-CM

## 2017-07-02 DIAGNOSIS — Z3481 Encounter for supervision of other normal pregnancy, first trimester: Secondary | ICD-10-CM

## 2017-07-02 DIAGNOSIS — Z3682 Encounter for antenatal screening for nuchal translucency: Secondary | ICD-10-CM

## 2017-07-02 LAB — POCT URINALYSIS DIPSTICK
Glucose, UA: NEGATIVE
Ketones, UA: NEGATIVE
LEUKOCYTES UA: NEGATIVE
NITRITE UA: NEGATIVE
Protein, UA: NEGATIVE

## 2017-07-02 NOTE — Patient Instructions (Signed)
 First Trimester of Pregnancy The first trimester of pregnancy is from week 1 until the end of week 12 (months 1 through 3). A week after a sperm fertilizes an egg, the egg will implant on the wall of the uterus. This embryo will begin to develop into a baby. Genes from you and your partner are forming the baby. The female genes determine whether the baby is a boy or a girl. At 6-8 weeks, the eyes and face are formed, and the heartbeat can be seen on ultrasound. At the end of 12 weeks, all the baby's organs are formed.  Now that you are pregnant, you will want to do everything you can to have a healthy baby. Two of the most important things are to get good prenatal care and to follow your health care provider's instructions. Prenatal care is all the medical care you receive before the baby's birth. This care will help prevent, find, and treat any problems during the pregnancy and childbirth. BODY CHANGES Your body goes through many changes during pregnancy. The changes vary from woman to woman.   You may gain or lose a couple of pounds at first.  You may feel sick to your stomach (nauseous) and throw up (vomit). If the vomiting is uncontrollable, call your health care provider.  You may tire easily.  You may develop headaches that can be relieved by medicines approved by your health care provider.  You may urinate more often. Painful urination may mean you have a bladder infection.  You may develop heartburn as a result of your pregnancy.  You may develop constipation because certain hormones are causing the muscles that push waste through your intestines to slow down.  You may develop hemorrhoids or swollen, bulging veins (varicose veins).  Your breasts may begin to grow larger and become tender. Your nipples may stick out more, and the tissue that surrounds them (areola) may become darker.  Your gums may bleed and may be sensitive to brushing and flossing.  Dark spots or blotches  (chloasma, mask of pregnancy) may develop on your face. This will likely fade after the baby is born.  Your menstrual periods will stop.  You may have a loss of appetite.  You may develop cravings for certain kinds of food.  You may have changes in your emotions from day to day, such as being excited to be pregnant or being concerned that something may go wrong with the pregnancy and baby.  You may have more vivid and strange dreams.  You may have changes in your hair. These can include thickening of your hair, rapid growth, and changes in texture. Some women also have hair loss during or after pregnancy, or hair that feels dry or thin. Your hair will most likely return to normal after your baby is born. WHAT TO EXPECT AT YOUR PRENATAL VISITS During a routine prenatal visit:  You will be weighed to make sure you and the baby are growing normally.  Your blood pressure will be taken.  Your abdomen will be measured to track your baby's growth.  The fetal heartbeat will be listened to starting around week 10 or 12 of your pregnancy.  Test results from any previous visits will be discussed. Your health care provider may ask you:  How you are feeling.  If you are feeling the baby move.  If you have had any abnormal symptoms, such as leaking fluid, bleeding, severe headaches, or abdominal cramping.  If you have any questions. Other   tests that may be performed during your first trimester include:  Blood tests to find your blood type and to check for the presence of any previous infections. They will also be used to check for low iron levels (anemia) and Rh antibodies. Later in the pregnancy, blood tests for diabetes will be done along with other tests if problems develop.  Urine tests to check for infections, diabetes, or protein in the urine.  An ultrasound to confirm the proper growth and development of the baby.  An amniocentesis to check for possible genetic problems.  Fetal  screens for spina bifida and Down syndrome.  You may need other tests to make sure you and the baby are doing well. HOME CARE INSTRUCTIONS  Medicines  Follow your health care provider's instructions regarding medicine use. Specific medicines may be either safe or unsafe to take during pregnancy.  Take your prenatal vitamins as directed.  If you develop constipation, try taking a stool softener if your health care provider approves. Diet  Eat regular, well-balanced meals. Choose a variety of foods, such as meat or vegetable-based protein, fish, milk and low-fat dairy products, vegetables, fruits, and whole grain breads and cereals. Your health care provider will help you determine the amount of weight gain that is right for you.  Avoid raw meat and uncooked cheese. These carry germs that can cause birth defects in the baby.  Eating four or five small meals rather than three large meals a day may help relieve nausea and vomiting. If you start to feel nauseous, eating a few soda crackers can be helpful. Drinking liquids between meals instead of during meals also seems to help nausea and vomiting.  If you develop constipation, eat more high-fiber foods, such as fresh vegetables or fruit and whole grains. Drink enough fluids to keep your urine clear or pale yellow. Activity and Exercise  Exercise only as directed by your health care provider. Exercising will help you:  Control your weight.  Stay in shape.  Be prepared for labor and delivery.  Experiencing pain or cramping in the lower abdomen or low back is a good sign that you should stop exercising. Check with your health care provider before continuing normal exercises.  Try to avoid standing for long periods of time. Move your legs often if you must stand in one place for a long time.  Avoid heavy lifting.  Wear low-heeled shoes, and practice good posture.  You may continue to have sex unless your health care provider directs you  otherwise. Relief of Pain or Discomfort  Wear a good support bra for breast tenderness.   Take warm sitz baths to soothe any pain or discomfort caused by hemorrhoids. Use hemorrhoid cream if your health care provider approves.   Rest with your legs elevated if you have leg cramps or low back pain.  If you develop varicose veins in your legs, wear support hose. Elevate your feet for 15 minutes, 3-4 times a day. Limit salt in your diet. Prenatal Care  Schedule your prenatal visits by the twelfth week of pregnancy. They are usually scheduled monthly at first, then more often in the last 2 months before delivery.  Write down your questions. Take them to your prenatal visits.  Keep all your prenatal visits as directed by your health care provider. Safety  Wear your seat belt at all times when driving.  Make a list of emergency phone numbers, including numbers for family, friends, the hospital, and police and fire departments. General   Tips  Ask your health care provider for a referral to a local prenatal education class. Begin classes no later than at the beginning of month 6 of your pregnancy.  Ask for help if you have counseling or nutritional needs during pregnancy. Your health care provider can offer advice or refer you to specialists for help with various needs.  Do not use hot tubs, steam rooms, or saunas.  Do not douche or use tampons or scented sanitary pads.  Do not cross your legs for long periods of time.  Avoid cat litter boxes and soil used by cats. These carry germs that can cause birth defects in the baby and possibly loss of the fetus by miscarriage or stillbirth.  Avoid all smoking, herbs, alcohol, and medicines not prescribed by your health care provider. Chemicals in these affect the formation and growth of the baby.  Schedule a dentist appointment. At home, brush your teeth with a soft toothbrush and be gentle when you floss. SEEK MEDICAL CARE IF:   You have  dizziness.  You have mild pelvic cramps, pelvic pressure, or nagging pain in the abdominal area.  You have persistent nausea, vomiting, or diarrhea.  You have a bad smelling vaginal discharge.  You have pain with urination.  You notice increased swelling in your face, hands, legs, or ankles. SEEK IMMEDIATE MEDICAL CARE IF:   You have a fever.  You are leaking fluid from your vagina.  You have spotting or bleeding from your vagina.  You have severe abdominal cramping or pain.  You have rapid weight gain or loss.  You vomit blood or material that looks like coffee grounds.  You are exposed to German measles and have never had them.  You are exposed to fifth disease or chickenpox.  You develop a severe headache.  You have shortness of breath.  You have any kind of trauma, such as from a fall or a car accident. Document Released: 01/22/2001 Document Revised: 06/14/2013 Document Reviewed: 12/08/2012 Exit[employer] Patient Information 2015 [employer]. This information is not intended to replace advice given to you by your health care provider. Make sure you discuss any questions you have with your health care provider.   Nausea & Vomiting  Have saltine crackers or pretzels by your bed and eat a few bites before you raise your head out of bed in the morning  Eat small frequent meals throughout the day instead of large meals  Drink plenty of fluids throughout the day to stay hydrated, just don't drink a lot of fluids with your meals.  This can make your stomach fill up faster making you feel sick  Do not brush your teeth right after you eat  Products with real ginger are good for nausea, like ginger ale and ginger hard candy Make sure it says made with real ginger!  Sucking on sour candy like lemon heads is also good for nausea  If your prenatal vitamins make you nauseated, take them at night so you will sleep through the nausea  Sea Bands  If you feel like you need  medicine for the nausea & vomiting please let us know  If you are unable to keep any fluids or food down please let us know   Constipation  Drink plenty of fluid, preferably water, throughout the day  Eat foods high in fiber such as fruits, vegetables, and grains  Exercise, such as walking, is a good way to keep your bowels regular  Drink warm fluids, especially warm   prune juice, or decaf coffee  Eat a 1/2 cup of real oatmeal (not instant), 1/2 cup applesauce, and 1/2-1 cup warm prune juice every day  If needed, you may take Colace (docusate sodium) stool softener once or twice a day to help keep the stool soft. If you are pregnant, wait until you are out of your first trimester (12-14 weeks of pregnancy)  If you still are having problems with constipation, you may take Miralax once daily as needed to help keep your bowels regular.  If you are pregnant, wait until you are out of your first trimester (12-14 weeks of pregnancy)  Safe Medications in Pregnancy   Acne: Benzoyl Peroxide Salicylic Acid  Backache/Headache: Tylenol: 2 regular strength every 4 hours OR              2 Extra strength every 6 hours  Colds/Coughs/Allergies: Benadryl (alcohol free) 25 mg every 6 hours as needed Breath right strips Claritin Cepacol throat lozenges Chloraseptic throat spray Cold-Eeze- up to three times per day Cough drops, alcohol free Flonase (by prescription only) Guaifenesin Mucinex Robitussin DM (plain only, alcohol free) Saline nasal spray/drops Sudafed (pseudoephedrine) & Actifed ** use only after [redacted] weeks gestation and if you do not have high blood pressure Tylenol Vicks Vaporub Zinc lozenges Zyrtec   Constipation: Colace Ducolax suppositories Fleet enema Glycerin suppositories Metamucil Milk of magnesia Miralax Senokot Smooth move tea  Diarrhea: Kaopectate Imodium A-D  *NO pepto Bismol  Hemorrhoids: Anusol Anusol HC Preparation  H Tucks  Indigestion: Tums Maalox Mylanta Zantac  Pepcid  Insomnia: Benadryl (alcohol free) 25mg every 6 hours as needed Tylenol PM Unisom, no Gelcaps  Leg Cramps: Tums MagGel  Nausea/Vomiting:  Bonine Dramamine Emetrol Ginger extract Sea bands Meclizine  Nausea medication to take during pregnancy:  Unisom (doxylamine succinate 25 mg tablets) Take one tablet daily at bedtime. If symptoms are not adequately controlled, the dose can be increased to a maximum recommended dose of two tablets daily (1/2 tablet in the morning, 1/2 tablet mid-afternoon and one at bedtime). Vitamin B6 100mg tablets. Take one tablet twice a day (up to 200 mg per day).  Skin Rashes: Aveeno products Benadryl cream or 25mg every 6 hours as needed Calamine Lotion 1% cortisone cream  Yeast infection: Gyne-lotrimin 7 Monistat 7   **If taking multiple medications, please check labels to avoid duplicating the same active ingredients **take medication as directed on the label ** Do not exceed 4000 mg of tylenol in 24 hours **Do not take medications that contain aspirin or ibuprofen      

## 2017-07-02 NOTE — Progress Notes (Signed)
Subjective:    [name] is a G3P2003 [redacted]w[redacted]d being seen today for her first obstetrical visit.  Her obstetrical history is significant for CS for breech twins and successful VBAC .  Pregnancy history fully reviewed. Had IOL for ICP last pg.  Has hypothyroidism, synthroid makes her feel bad, so has tried other meds, not working (TSH 12 last week at PCP). Is being referred to endocrinologist. Says liver enzymes have been up at times, says PCP says it's d/t hx of mono. To get copy of  labs.   Patient reports fatigue.  Vitals:   07/02/17 0928  Weight: 299 lb (135.6 kg)    HISTORY: OB History  Gravida Para Term Preterm AB Living  SAB TAB Ectopic Multiple Live Births        1 3    # Outcome Date GA Lbr Len/2nd Weight Sex Delivery Anes PTL Lv  3 Current           2 Term 10/21/15 [redacted]w[redacted]d 13:30 / 01:57 7 lb 13 oz (3.545 kg) M Vag-Spont EPI N LIV     Complications: Cholestasis  1A Term 05/04/10 [redacted]w[redacted]d  6 lb 6 oz (2.892 kg) F CS-LTranv Spinal N LIV  1B Term 05/04/10 [redacted]w[redacted]d  6 lb 10 oz (3.005 kg) F CS-LTranv Spinal N LIV   Past Medical History:  Diagnosis Date  . Breast nodule 10/10/2014  . Breast pain 10/10/2014  . BV (bacterial vaginosis) 11/12/2012  . Fatty liver   . Hypothyroid 02/14/2015  . Irregular menses 05/21/2013  . Migraines   . Obesity   . Other and unspecified ovarian cyst 05/31/2013   Right complex ?cystic vs solid mass on ovary will schedule appt with [facility]  . Pregnant 02/14/2015  . Thyroid disease    Past Surgical History:  Procedure Laterality Date  . CESAREAN SECTION    . COLONOSCOPY    . OOPHORECTOMY    . TONSILLECTOMY    . WISDOM TOOTH EXTRACTION     Family History  Problem Relation Age of Onset  . Diabetes Maternal Grandmother   . Hypertension Maternal Grandmother   . Cancer Maternal Grandmother        breast  . Diabetes Maternal Grandfather   . Hypertension Maternal Grandfather   . Leukemia Maternal Grandfather   . Diabetes Mother   .  Hypertension Mother   . Heart attack Mother   . Stroke Mother   . Kidney disease Paternal Grandfather   . Seizures Father   . Other Father        back problems  . Dementia Paternal Grandmother   . Cancer Maternal Aunt   . Diabetes Maternal Uncle      Exam                                      System:     Skin: normal coloration and turgor, no rashes    Neurologic: oriented, normal, normal mood   Extremities: normal strength, tone, and muscle mass   HEENT PERRLA   Mouth/Teeth mucous membranes moist, normla dentition   Neck supple and no masses   Cardiovascular: Was irregular at intake, regular rate and rhythm   Respiratory:  appears well, vitals normal, no respiratory distress, acyanotic   Abdomen: soft, non-tender;  FHR: 160 [place]        The nature of [facility] with multiple MDs and other Advanced Practice Providers was explained to patient; also emphasized that residents, students are part of our team.  Assessment:    Pregnancy: G3P2003 Patient Active Problem List   Diagnosis Date Noted  . History of vaginal delivery following previous cesarean delivery 07/02/2017  . Supervision of normal pregnancy 07/02/2017  . UTI (urinary tract infection) during pregnancy, first trimester 06/23/2017  . Cholestasis of pregnancy in third trimester 10/19/2015  . Intrahepatic cholestasis of pregnancy, antepartum- RESOLVED 06/05/2015  . Anxiety 05/30/2015  . 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 Previous cesarean section complicating pregnancy 03/07/2015  . S/P right oophorectomy 03/07/2015  . Hypothyroid in pregnancy, antepartum 02/14/2015  . Breast nodule 10/10/2014  . Breast pain 10/10/2014  . 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 To get labs from PCP Continue prenatal vitamins  Problem list reviewed and updated  Reviewed n/v relief measures and warning s/s to report  Reviewed recommended weight gain  based on pre-gravid BMI  Encouraged well-balanced diet Genetic Screening discussed Integrated Screen: requested.  Ultrasound discussed; fetal survey: requested.  Return in about 1 month (around 08/01/2017) for LROB.  Jacklyn Shell 07/02/2017

## 2017-07-03 LAB — URINALYSIS, ROUTINE W REFLEX MICROSCOPIC
Bilirubin, UA: NEGATIVE
Glucose, UA: NEGATIVE
Ketones, UA: NEGATIVE
NITRITE UA: NEGATIVE
PH UA: 5.5 (ref 5.0–7.5)
Protein, UA: NEGATIVE
Specific Gravity, UA: 1.021 (ref 1.005–1.030)
Urobilinogen, Ur: 0.2 mg/dL (ref 0.2–1.0)

## 2017-07-03 LAB — OBSTETRIC PANEL, INCLUDING HIV
ANTIBODY SCREEN: NEGATIVE
BASOS: 0 %
Basophils Absolute: 0 10*3/uL (ref 0.0–0.2)
EOS (ABSOLUTE): 0.3 10*3/uL (ref 0.0–0.4)
EOS: 3 %
HEMATOCRIT: 40.2 % (ref 34.0–46.6)
HEMOGLOBIN: 13.3 g/dL (ref 11.1–15.9)
HEP B S AG: NEGATIVE
HIV Screen 4th Generation wRfx: NONREACTIVE
Immature Grans (Abs): 0 10*3/uL (ref 0.0–0.1)
Immature Granulocytes: 0 %
Lymphocytes Absolute: 3.8 10*3/uL — ABNORMAL HIGH (ref 0.7–3.1)
Lymphs: 39 %
MCH: 29.2 pg (ref 26.6–33.0)
MCHC: 33.1 g/dL (ref 31.5–35.7)
MCV: 88 fL (ref 79–97)
MONOCYTES: 4 %
Monocytes Absolute: 0.4 10*3/uL (ref 0.1–0.9)
NEUTROS ABS: 5.3 10*3/uL (ref 1.4–7.0)
Neutrophils: 54 %
Platelets: 326 10*3/uL (ref 150–450)
RBC: 4.55 x10E6/uL (ref 3.77–5.28)
RDW: 14.1 % (ref 12.3–15.4)
RH TYPE: POSITIVE
RPR Ser Ql: NONREACTIVE
RUBELLA: 3.6 {index} (ref >0.99)
WBC: 9.8 10*3/uL (ref 3.4–10.8)

## 2017-07-03 LAB — MICROSCOPIC EXAMINATION: Casts: NONE SEEN /lpf

## 2017-07-03 LAB — PMP SCREEN PROFILE (10S), URINE
AMPHETAMINE SCREEN URINE: NEGATIVE ng/mL
BARBITURATE SCREEN URINE: NEGATIVE ng/mL
BENZODIAZEPINE SCREEN, URINE: NEGATIVE ng/mL
CANNABINOIDS UR QL SCN: NEGATIVE ng/mL
COCAINE(METAB.)SCREEN, URINE: NEGATIVE ng/mL
CREATININE(CRT), U: 171.3 mg/dL (ref 20.0–300.0)
METHADONE SCREEN, URINE: NEGATIVE ng/mL
OXYCODONE+OXYMORPHONE UR QL SCN: NEGATIVE ng/mL
Opiate Scrn, Ur: NEGATIVE ng/mL
PH UR, DRUG SCRN: 6 (ref 4.5–8.9)
PHENCYCLIDINE QUANTITATIVE URINE: NEGATIVE ng/mL
PROPOXYPHENE SCREEN URINE: NEGATIVE ng/mL

## 2017-07-03 LAB — TSH: TSH: 11.97 u[IU]/mL — ABNORMAL HIGH (ref 0.450–4.500)

## 2017-07-04 LAB — URINE CULTURE

## 2017-07-04 LAB — GC/CHLAMYDIA PROBE AMP
CHLAMYDIA, DNA PROBE: NEGATIVE
Neisseria gonorrhoeae by PCR: NEGATIVE

## 2017-07-11 ENCOUNTER — Telehealth: Payer: Self-pay | Admitting: Advanced Practice Midwife

## 2017-07-11 NOTE — Telephone Encounter (Signed)
Pt called stating that she had discussed her thyroid medication with Drenda Freeze. She states that they discussed switching back to synthroid. She would now like to switch. Advised pt that Drenda Freeze is out of the office until Tuesday and I would send her request to [place]. Pt verbalized understanding.

## 2017-07-15 ENCOUNTER — Encounter (INDEPENDENT_AMBULATORY_CARE_PROVIDER_SITE_OTHER): Payer: Self-pay

## 2017-07-15 ENCOUNTER — Other Ambulatory Visit: Payer: Self-pay | Admitting: Advanced Practice Midwife

## 2017-07-15 MED ORDER — LEVOTHYROXINE SODIUM 75 MCG PO TABS: 75.0000 ug | ORAL_TABLET | Freq: Every day | ORAL | 5 refills | Status: DC

## 2017-07-15 NOTE — Telephone Encounter (Signed)
Message sent to pt--she had appt w/endocrinologist

## 2017-07-15 NOTE — Progress Notes (Signed)
Synthroid 75 mcg

## 2017-07-30 ENCOUNTER — Other Ambulatory Visit (INDEPENDENT_AMBULATORY_CARE_PROVIDER_SITE_OTHER): Payer: Commercial Managed Care - PPO

## 2017-07-30 ENCOUNTER — Encounter: Payer: Self-pay | Admitting: Obstetrics and Gynecology

## 2017-07-30 ENCOUNTER — Ambulatory Visit (INDEPENDENT_AMBULATORY_CARE_PROVIDER_SITE_OTHER): Payer: Commercial Managed Care - PPO | Admitting: Obstetrics and Gynecology

## 2017-07-30 ENCOUNTER — Other Ambulatory Visit: Payer: Self-pay

## 2017-07-30 VITALS — BP 108/69 | HR 87 | Wt 290.0 lb

## 2017-07-30 DIAGNOSIS — O09291 Supervision of pregnancy with other poor reproductive or obstetric history, first trimester: Secondary | ICD-10-CM

## 2017-07-30 DIAGNOSIS — Z331 Pregnant state, incidental: Secondary | ICD-10-CM

## 2017-07-30 DIAGNOSIS — Z3682 Encounter for antenatal screening for nuchal translucency: Secondary | ICD-10-CM | POA: Diagnosis not present

## 2017-07-30 DIAGNOSIS — O34219 Maternal care for unspecified type scar from previous cesarean delivery: Secondary | ICD-10-CM

## 2017-07-30 DIAGNOSIS — Z3A12 12 weeks gestation of pregnancy: Secondary | ICD-10-CM

## 2017-07-30 DIAGNOSIS — Z1389 Encounter for screening for other disorder: Secondary | ICD-10-CM

## 2017-07-30 DIAGNOSIS — Z3481 Encounter for supervision of other normal pregnancy, first trimester: Secondary | ICD-10-CM

## 2017-07-30 LAB — POCT URINALYSIS DIPSTICK
Blood, UA: NEGATIVE
GLUCOSE UA: NEGATIVE
Ketones, UA: NEGATIVE
LEUKOCYTES UA: NEGATIVE
Nitrite, UA: NEGATIVE
Protein, UA: POSITIVE — AB

## 2017-07-30 NOTE — Progress Notes (Signed)
[id number]  Estimated Date of Delivery: 02/08/18 LROB 5431w3d, prior Low transverse C-section with successful TOLAC-> VBAC,after IOL at 37w for ICP. Also Hypothyroid  Desires PP BTL  Chief Complaint  Patient presents with  . Routine Prenatal Visit  ____  Patient complaints:none. Patient reports  Too early for good fetal movement,                           denies any bleeding , rupture of membranes,or regular contractions.  Blood pressure 108/69, pulse 87, weight 290 lb (131.5 kg), last menstrual period 04/27/2017, not currently breastfeeding.   Urine results:notable for neg GC andCHL at intake refer to the ob flow sheet for FH and FHR, ,                          Physical Examination: General appearance - alert, well appearing, and in no distress                                      Abdomen - FH n/a                                                        -FHR 152                                                         soft, nontender, nondistended, no masses or organomegaly Well healed pfannensteil                                      Pelvic -                                             Questions were answered. Assessment: LROB G3P2003 @ 2531w3d , prior c/s with successful 3 day IOL for ICP and VBAC Hypothyroid Body mass index is 51.37 kg/m.   Plan:  Continued routine obstetrical care, NT testing 13 wk  F/u in 1 weeks for NT first IT

## 2017-07-30 NOTE — Addendum Note (Signed)
Addended by: Tish FredericksonLANCASTER, Donavyn Fecher A on: 07/30/2017 09:36 AM   Modules accepted: Orders

## 2017-07-30 NOTE — Progress Notes (Signed)
US 12+3 wks,measurements c/w dates,normal left ovary,right oophorectomy,CRL 59.97 mm,NB present,NT 1.3 mm,fhr 157 bpm

## 2017-08-01 LAB — INTEGRATED 1
Crown Rump Length: 60 mm
Gest. Age on Collection Date: 12.3 weeks
Maternal Age at EDD: 30.1 yr
Nuchal Translucency (NT): 1.3 mm
Number of Fetuses: 1
PAPP-A VALUE: 437.3 ng/mL
Weight: 290 [lb_av]

## 2017-08-02 ENCOUNTER — Encounter: Payer: Self-pay | Admitting: Adult Health

## 2017-08-07 ENCOUNTER — Encounter: Payer: Self-pay | Admitting: "Endocrinology

## 2017-08-07 ENCOUNTER — Ambulatory Visit: Payer: Commercial Managed Care - PPO | Admitting: "Endocrinology

## 2017-08-07 ENCOUNTER — Other Ambulatory Visit: Payer: Self-pay | Admitting: "Endocrinology

## 2017-08-07 VITALS — BP 100/71 | HR 86 | Ht 63.0 in | Wt 290.0 lb

## 2017-08-07 DIAGNOSIS — E039 Hypothyroidism, unspecified: Secondary | ICD-10-CM

## 2017-08-07 MED ORDER — LEVOTHYROXINE SODIUM 100 MCG PO TABS: 100.0000 ug | ORAL_TABLET | Freq: Every day | ORAL | 2 refills | Status: DC

## 2017-08-07 NOTE — Progress Notes (Signed)
Endocrinology Consult Note                                            08/07/2017, 1:34 PM   Subjective:    Patient ID: [name], female    DOB: 1985/05/18, PCP Richardean Chimeraaniel, Terry G, MD   Past Medical History:  Diagnosis Date  . Breast nodule 10/10/2014  . Breast pain 10/10/2014  . BV (bacterial vaginosis) 11/12/2012  . Fatty liver   . Hypothyroid 02/14/2015  . Irregular menses 05/21/2013  . Migraines   . Obesity   . Other and unspecified ovarian cyst 05/31/2013   Right complex ?cystic vs solid mass on ovary will schedule appt with [facility]  . Pregnant 02/14/2015  . Thyroid disease    Past Surgical History:  Procedure Laterality Date  . CESAREAN SECTION    . COLONOSCOPY    . OOPHORECTOMY    . TONSILLECTOMY    . WISDOM TOOTH EXTRACTION     Social History   Socioeconomic History  . Marital status: Single    Spouse name: Not on file  . Number of children: Not on file  . Years of education: Not on file  . Highest education level: Not on file  Occupational History  . Not on file  Social Needs  . Financial resource strain: Not on file  . Food insecurity:    Worry: Not on file    Inability: Not on file  . Transportation needs:    Medical: Not on file    Non-medical: Not on file  Tobacco Use  . Smoking status: Former Smoker    Packs/day: 0.25    Years: 8.00    Pack years: 2.00    Types: Cigarettes    Last attempt to quit: 10/07/2010    Years since quitting: 6.8  . Smokeless tobacco: Never Used  Substance and Sexual Activity  . Alcohol use: No    Comment: occ.; not now  . Drug use: No  . Sexual activity: Yes    Birth control/protection: None  Lifestyle  . Physical activity:    Days per week: Not on file    Minutes per session: Not on file  . Stress: Not on file  Relationships  . Social connections:    Talks on phone: Not on file    Gets together: Not on file    Attends religious service: Not on file    Active member of club or organization: Not on file     Attends meetings of clubs or organizations: Not on file    Relationship status: Not on file  Other Topics Concern  . Not on file  Social History Narrative  . Not on file   Outpatient Encounter Medications as of 08/07/2017  Medication Sig  . levothyroxine (SYNTHROID, LEVOTHROID) 100 MCG tablet Take 1 tablet (100 mcg total) by mouth daily before breakfast.  . Prenat-FeCbn-FeAspGl-FA-Omega (OB COMPLETE PETITE) 35-5-1-200 MG CAPS Take 1 capsule by mouth daily.  . [DISCONTINUED] levothyroxine (SYNTHROID, LEVOTHROID) 75 MCG tablet Take 1 tablet (75 mcg total) by mouth daily before breakfast.   No facility-administered encounter medications on file as of 08/07/2017.    ALLERGIES: Allergies  Allergen Reactions  . Imitrex [Sumatriptan]     Makes migraines worse.   . Maxalt [Rizatriptan]     Makes migraines worse  . Phenergan [Promethazine Hcl]  Nausea And Vomiting  . Tape Other (See Comments)    Blisters on abdomen after C Section    VACCINATION STATUS: There is no immunization history for the selected administration types on file for this patient.  HPI [name] is 32 y.o. female who presents today with a medical history as above. she is being seen in consultation for hypothyroidism requested by Richardean Chimera, MD. -She is currently on her third pregnancy at 14 weeks of gestation. -He reports that she was diagnosed with hypothyroidism approximately at age 66 years.  Her thyroid hormone replacement have been interrupted several times for different reasons including lack of insurance. -She is currently on levothyroxine 75 mcg p.o. every morning, and reports that it has not been adjusted during this course of pregnancy. -Review of thyroid function tests include above target TSH of 11.97 on Jul 02, 2017 which would have been her teens week of gestation. -She denies cold intolerance.  Reportedly, her pregnancy is going well.  She lost 10 pounds over the last 5 to 6 weeks time.     -She denies any family history of thyroid dysfunction.  She denies dysphagia, odynophagia, hoarseness of voice.  Denies family history of thyroid cancer. -She has 2 healthy children at home. -She admits to have been inconsistent taking her thyroid hormone, even when she gets it.  Review of Systems  Constitutional: + weight loss, no fatigue, no subjective hyperthermia, no subjective hypothermia Eyes: no blurry vision, no xerophthalmia ENT: no sore throat, no nodules palpated in throat, no dysphagia/odynophagia, no hoarseness Cardiovascular: no Chest Pain, no Shortness of Breath, no palpitations, no leg swelling Respiratory: no cough, no SOB Gastrointestinal: no Nausea/Vomiting/Diarhhea Musculoskeletal: no muscle/joint aches Skin: no rashes Neurological: no tremors, no numbness, no tingling, no dizziness Psychiatric: no depression, no anxiety  Objective:    BP 100/71   Pulse 86   Ht 5\' 3"  (1.6 m)   Wt 290 lb (131.5 kg)   LMP 04/27/2017 (Exact Date)   BMI 51.37 kg/m   Wt Readings from Last 3 Encounters:  08/07/17 290 lb (131.5 kg)  07/30/17 290 lb (131.5 kg)  07/02/17 299 lb (135.6 kg)    Physical Exam  Constitutional: + Early second trimester pregnancy, not in acute distress, normal state of mind Eyes: PERRLA, EOMI, no exophthalmos ENT: moist mucous membranes, no thyromegaly, no cervical lymphadenopathy Cardiovascular: normal precordial activity, Regular Rate and Rhythm, no Murmur/Rubs/Gallops Respiratory:  adequate breathing efforts, no gross chest deformity, Clear to auscultation bilaterally Gastrointestinal: abdomen soft, Non -tender, No distension, Bowel Sounds present Musculoskeletal: no gross deformities, strength intact in all four extremities Skin: moist, warm, no rashes Neurological: no tremor with outstretched hands, Deep tendon reflexes normal in all four extremities.  CMP ( most recent) CMP     Component Value Date/Time   NA 138 06/28/2015 1124   K 3.9  06/28/2015 1124   CL 104 06/28/2015 1124   CO2 19 06/28/2015 1124   GLUCOSE 82 06/28/2015 1124   GLUCOSE 91 10/06/2013 1015   BUN 6 06/28/2015 1124   CREATININE 0.43 (L) 06/28/2015 1124   CALCIUM 8.4 (L) 06/28/2015 1124   PROT 5.9 (L) 06/28/2015 1124   ALBUMIN 3.2 (L) 06/28/2015 1124   AST 13 06/28/2015 1124   ALT 13 06/28/2015 1124   ALKPHOS 170 (H) 06/28/2015 1124   BILITOT 0.3 06/28/2015 1124   GFRNONAA 140 06/28/2015 1124   GFRAA 161 06/28/2015 1124     Lab Results  Component Value Date  TSH 11.970 (H) 07/02/2017   TSH 4.970 (H) 08/03/2015   TSH 5.360 (H) 06/28/2015   TSH 6.340 (H) 05/31/2015   TSH 2.560 04/04/2015   TSH 7.160 (H) 03/07/2015   TSH 12.660 (H) 02/14/2015   TSH 2.998 05/21/2013     Assessment & Plan:   1. Hypothyroidism, unspecified type  - [name]  is being seen at a kind request of Richardean Chimera, MD. - I have reviewed her available thyroid records and clinically evaluated the patient. - Based on reviews, she has both thyroidism and second trimester pregnancy. -Based on her most recent TSH of 11.97 on Jul 02, 2017, she is probably under replaced with risk of effect on pregnancy during the first trimester.  -I will also send her to lab today to obtain TSH/free T4, thyroglobulin antibodies and TPO antibodies.  -She has questionable compliance, even before we see her repeat labs, it is clear that she will need a higher dose of levothyroxine.   I discussed and increased her levothyroxine to 100 mcg p.o. daily before breakfast .  -I have asked her to return in 1 week to discuss her labs and make further adjustments if necessary.  -For the long-term, I discussed with her the need for these thyroid hormone and daily basis and the fact that she cannot come off of the thyroid hormone replacement.  For cost reasons, I advised her to stay on generic levothyroxine instead of brand Synthroid.   - We discussed about correct intake of levothyroxine, at  fasting, with water, separated by at least 30 minutes from breakfast, and separated by more than 4 hours from calcium, iron, multivitamins, acid reflux medications (PPIs). -Patient is made aware of the fact that thyroid hormone replacement is needed for life, dose to be adjusted by periodic monitoring of thyroid function tests.  - I advised her  to maintain close follow up with Richardean Chimera, MD for primary care needs.  - Time spent with the patient: 45 minutes, of which >50% was spent in obtaining information about her symptoms, reviewing her previous labs, evaluations, and treatments, counseling her about her hypothyroidism, and developing a plan to confirm the diagnosis and long term treatment as necessary.  Lisa Goodnight participated in the discussions, expressed understanding, and voiced agreement with the above plans.  All questions were answered to her satisfaction. she is encouraged to contact clinic should she have any questions or concerns prior to her return visit.  Follow up plan: Return in about 1 week (around 08/14/2017) for labs today.   Marquis Lunch, MD [facility] [address] [place], [place] [address] Phone: [phone]  Fax: [phone]     08/07/2017, 1:34 PM  This note was partially dictated with voice recognition software. Similar sounding words can be transcribed inadequately or may not  be corrected upon review.

## 2017-08-08 LAB — TSH: TSH: 12.26 u[IU]/mL — AB (ref 0.450–4.500)

## 2017-08-08 LAB — THYROID PEROXIDASE ANTIBODY: THYROID PEROXIDASE ANTIBODY: 54 [IU]/mL — AB (ref 0–34)

## 2017-08-08 LAB — THYROGLOBULIN ANTIBODY

## 2017-08-08 LAB — T4, FREE: Free T4: 1.09 ng/dL (ref 0.82–1.77)

## 2017-08-13 ENCOUNTER — Ambulatory Visit (INDEPENDENT_AMBULATORY_CARE_PROVIDER_SITE_OTHER): Payer: Commercial Managed Care - PPO | Admitting: *Deleted

## 2017-08-13 ENCOUNTER — Encounter: Payer: Self-pay | Admitting: *Deleted

## 2017-08-13 VITALS — BP 110/74 | HR 80 | Wt 290.0 lb

## 2017-08-13 DIAGNOSIS — O3680X0 Pregnancy with inconclusive fetal viability, not applicable or unspecified: Secondary | ICD-10-CM

## 2017-08-13 DIAGNOSIS — W501XXA Accidental kick by another person, initial encounter: Secondary | ICD-10-CM
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
## 2017-08-26 ENCOUNTER — Ambulatory Visit (INDEPENDENT_AMBULATORY_CARE_PROVIDER_SITE_OTHER): Payer: Commercial Managed Care - PPO | Admitting: "Endocrinology

## 2017-08-26 ENCOUNTER — Encounter: Payer: Self-pay | Admitting: "Endocrinology

## 2017-08-26 VITALS — BP 116/76 | HR 88 | Ht 63.0 in | Wt 289.0 lb

## 2017-08-26 DIAGNOSIS — E038 Other specified hypothyroidism: Secondary | ICD-10-CM | POA: Diagnosis not present

## 2017-08-26 DIAGNOSIS — E063 Autoimmune thyroiditis: Secondary | ICD-10-CM | POA: Diagnosis not present

## 2017-08-26 MED ORDER — LEVOTHYROXINE SODIUM 112 MCG PO TABS: 112.0000 ug | ORAL_TABLET | Freq: Every day | ORAL | 3 refills | Status: DC

## 2017-08-26 NOTE — Progress Notes (Signed)
Endocrinology follow-up note                                            08/26/2017, 6:51 PM   Subjective:    Patient ID: [name], female    DOB: 28-Jan-1986, PCP [name], Lisa G, MD   Past Medical History:  Diagnosis Date  . Breast nodule 10/10/2014  . Breast pain 10/10/2014  . BV (bacterial vaginosis) 11/12/2012  . Fatty liver   . Hypothyroid 02/14/2015  . Irregular menses 05/21/2013  . Migraines   . Obesity   . Other and unspecified ovarian cyst 05/31/2013   Right complex ?cystic vs solid mass on ovary will schedule appt with [facility]  . Pregnant 02/14/2015  . Thyroid disease    Past Surgical History:  Procedure Laterality Date  . CESAREAN SECTION    . COLONOSCOPY    . OOPHORECTOMY    . TONSILLECTOMY    . WISDOM TOOTH EXTRACTION     Social History   Socioeconomic History  . Marital status: Single    Spouse name: Not on file  . Number of children: Not on file  . Years of education: Not on file  . Highest education level: Not on file  Occupational History  . Not on file  Social Needs  . Financial resource strain: Not on file  . Food insecurity:    Worry: Not on file    Inability: Not on file  . Transportation needs:    Medical: Not on file    Non-medical: Not on file  Tobacco Use  . Smoking status: Former Smoker    Packs/day: 0.25    Years: 8.00    Pack years: 2.00    Types: Cigarettes    Last attempt to quit: 10/07/2010    Years since quitting: 6.8  . Smokeless tobacco: Never Used  Substance and Sexual Activity  . Alcohol use: No    Comment: occ.; not now  . Drug use: No  . Sexual activity: Yes    Birth control/protection: None  Lifestyle  . Physical activity:    Days per week: Not on file    Minutes per session: Not on file  . Stress: Not on file  Relationships  . Social connections:    Talks on phone: Not on file    Gets together: Not on file    Attends religious service: Not on file    Active member of club or organization: Not on file     Attends meetings of clubs or organizations: Not on file    Relationship status: Not on file  Other Topics Concern  . Not on file  Social History Narrative  . Not on file   Outpatient Encounter Medications as of 08/26/2017  Medication Sig  . levothyroxine (SYNTHROID, LEVOTHROID) 112 MCG tablet Take 1 tablet (112 mcg total) by mouth daily before breakfast.  . Prenat-FeCbn-FeAspGl-FA-Omega (OB COMPLETE PETITE) 35-5-1-200 MG CAPS Take 1 capsule by mouth daily.  . [DISCONTINUED] levothyroxine (SYNTHROID, LEVOTHROID) 100 MCG tablet Take 1 tablet (100 mcg total) by mouth daily before breakfast.  . [DISCONTINUED] levothyroxine (SYNTHROID, LEVOTHROID) 75 MCG tablet Take 1 tablet (75 mcg total) by mouth daily before breakfast.   No facility-administered encounter medications on file as of 08/26/2017.    ALLERGIES: Allergies  Allergen Reactions  . Imitrex [Sumatriptan]  Makes migraines worse.   . Maxalt [Rizatriptan]     Makes migraines worse  . Phenergan [Promethazine Hcl] Nausea And Vomiting  . Tape Other (See Comments)    Blisters on abdomen after C Section    VACCINATION STATUS: There is no immunization history for the selected administration types on file for this patient.  HPI [name] is 32 y.o. female who presents today with repeat thyroid function test.  She has a medical history of long-standing hypothyroidism now confirmed to be secondary to Hashimoto's thyroiditis.    -She is currently on her third pregnancy at 18 weeks of gestation. -She is more consistent taking her thyroid hormone this time.    -Historically, her thyroid hormone replacement have been interrupted several times for different reasons including lack of insurance.  -She denies cold intolerance.  her pregnancy is going well.  She has a steady weight since last visit, did not gain.    -She denies any family history of thyroid dysfunction.  She denies dysphagia, odynophagia, hoarseness of voice.   Denies family history of thyroid cancer. -She has 2 healthy children at home.  Review of Systems  Constitutional: + Steady weight ,  no fatigue, no subjective hyperthermia, no subjective hypothermia Eyes: no blurry vision, no xerophthalmia ENT: no sore throat, no nodules palpated in throat, no dysphagia/odynophagia, no hoarseness Cardiovascular: no Chest Pain, no Shortness of Breath, no palpitations, no leg swelling Respiratory: no cough, no SOB Gastrointestinal: no Nausea/Vomiting/Diarhhea Musculoskeletal: no muscle/joint aches Skin: no rashes Neurological: no tremors, no numbness, no tingling, no dizziness Psychiatric: no depression, no anxiety  Objective:    BP 116/76   Pulse 88   Ht 5\' 3"  (1.6 m)   Wt 289 lb (131.1 kg)   LMP 04/27/2017 (Exact Date)   BMI 51.19 kg/m   Wt Readings from Last 3 Encounters:  08/26/17 289 lb (131.1 kg)  08/13/17 290 lb (131.5 kg)  08/07/17 290 lb (131.5 kg)    Physical Exam  Constitutional: + Early second trimester pregnancy, not in acute distress, normal state of mind Eyes: PERRLA, EOMI, no exophthalmos ENT: moist mucous membranes, no thyromegaly, no cervical lymphadenopathy  Musculoskeletal: no gross deformities, strength intact in all four extremities Skin: moist, warm, no rashes Neurological: no tremor with outstretched hands, Deep tendon reflexes normal in all four extremities.  CMP ( most recent) CMP     Component Value Date/Time   NA 138 06/28/2015 1124   K 3.9 06/28/2015 1124   CL 104 06/28/2015 1124   CO2 19 06/28/2015 1124   GLUCOSE 82 06/28/2015 1124   GLUCOSE 91 10/06/2013 1015   BUN 6 06/28/2015 1124   CREATININE 0.43 (L) 06/28/2015 1124   CALCIUM 8.4 (L) 06/28/2015 1124   PROT 5.9 (L) 06/28/2015 1124   ALBUMIN 3.2 (L) 06/28/2015 1124   AST 13 06/28/2015 1124   ALT 13 06/28/2015 1124   ALKPHOS 170 (H) 06/28/2015 1124   BILITOT 0.3 06/28/2015 1124   GFRNONAA 140 06/28/2015 1124   GFRAA 161 06/28/2015 1124      Lab Results  Component Value Date   TSH 12.260 (H) 08/07/2017   TSH 11.970 (H) 07/02/2017   TSH 4.970 (H) 08/03/2015   TSH 5.360 (H) 06/28/2015   TSH 6.340 (H) 05/31/2015   TSH 2.560 04/04/2015   TSH 7.160 (H) 03/07/2015   TSH 12.660 (H) 02/14/2015   TSH 2.998 05/21/2013   FREET4 1.09 08/07/2017     Assessment & Plan:   1. Hypothyroidism due  to Hashimoto's thyroiditis  - [name]  Is currently pregnant at [redacted] weeks of gestation.  She has significant medical history of noncompliance/nonadherence and recurrent interruption seen her thyroid hormone replacement. -Based on her recent thyroid function tests, she will benefit from higher dose of thyroid hormone.  -I discussed and increase her levothyroxine to 112 mcg p.o. Daily before breakfast.  - We discussed about correct intake of levothyroxine, at fasting, with water, separated by at least 30 minutes from breakfast, and separated by more than 4 hours from calcium, iron, multivitamins, acid reflux medications (PPIs). -Patient is made aware of the fact that thyroid hormone replacement is needed for life, dose to be adjusted by periodic monitoring of thyroid function tests.  -For the long-term, I discussed with her the need for these thyroid hormone replacement on daily basis and the fact that she cannot come off of the thyroid hormone under any circumstances.  For cost reasons, I advised her to stay on generic levothyroxine instead of brand Synthroid.  - I advised her  to maintain close follow up with Lisa Chimera, MD for primary care needs.  Follow up plan: Return in about 8 weeks (around 10/21/2017) for follow up with pre-visit labs.   Marquis Lunch, MD [facility] [address] [place], [place] [address] Phone: [phone]  Fax: [phone]     08/26/2017, 6:51 PM  This note was partially dictated with voice recognition software. Similar sounding words can be  transcribed inadequately or may not  be corrected upon review.

## 2017-08-27 ENCOUNTER — Encounter: Payer: Commercial Managed Care - PPO | Admitting: Obstetrics and Gynecology

## 2017-08-27 ENCOUNTER — Encounter: Payer: Self-pay | Admitting: Advanced Practice Midwife

## 2017-08-27 ENCOUNTER — Ambulatory Visit (INDEPENDENT_AMBULATORY_CARE_PROVIDER_SITE_OTHER): Payer: Commercial Managed Care - PPO | Admitting: Advanced Practice Midwife

## 2017-08-27 VITALS — BP 114/73 | HR 81 | Wt 288.0 lb

## 2017-08-27 DIAGNOSIS — Z1379 Encounter for other screening for genetic and chromosomal anomalies: Secondary | ICD-10-CM

## 2017-08-27 DIAGNOSIS — Z1389 Encounter for screening for other disorder: Secondary | ICD-10-CM

## 2017-08-27 DIAGNOSIS — Z3482 Encounter for supervision of other normal pregnancy, second trimester: Secondary | ICD-10-CM

## 2017-08-27 DIAGNOSIS — Z331 Pregnant state, incidental: Secondary | ICD-10-CM

## 2017-08-27 DIAGNOSIS — Z363 Encounter for antenatal screening for malformations: Secondary | ICD-10-CM

## 2017-08-27 DIAGNOSIS — Z3A16 16 weeks gestation of pregnancy: Secondary | ICD-10-CM

## 2017-08-27 LAB — POCT URINALYSIS DIPSTICK OB
Blood, UA: NEGATIVE
Glucose, UA: NEGATIVE — AB
KETONES UA: NEGATIVE
LEUKOCYTES UA: NEGATIVE
NITRITE UA: NEGATIVE

## 2017-08-27 NOTE — Progress Notes (Signed)
  [id number] 4220w3d Estimated Date of Delivery: 02/08/18  Blood pressure 114/73, pulse 81, weight 288 lb (130.6 kg), last menstrual period 04/27/2017, not currently breastfeeding.   BP weight and urine results all reviewed and noted.  Please refer to the obstetrical flow sheet for the fundal height and fetal heart rate documentation:  Patient denies any bleeding and no rupture of membranes symptoms or regular contractions. Patient is without complaints. Appetite is still "meh". Sensitive to smells. Gets dizzy sometime. Tips given All questions were answered.  Dr. Fransico HimNida increased synthroid to 112mcg yesterday Physical Assessment:   Vitals:   08/27/17 1446  BP: 114/73  Pulse: 81  Weight: 288 lb (130.6 kg)  Body mass index is 51.02 kg/m.        Physical Examination:   General appearance: Well appearing, and in no distress  Mental status: Alert, oriented to person, place, and time  Skin: Warm & dry  Cardiovascular: Normal heart rate noted  Respiratory: Normal respiratory effort, no distress  Abdomen: Soft, gravid, nontender  Pelvic: Cervical exam deferred         Extremities: Edema: Trace  Fetal Status:     Movement: Present    Results for orders placed or performed in visit on 08/27/17 (from the past 24 hour(s))  POC Urinalysis Dipstick OB   Collection Time: 08/27/17  2:55 PM  Result Value Ref Range   Color, UA     Clarity, UA     Glucose, UA Negative (A) (none)   Bilirubin, UA     Ketones, UA neg    Spec Grav, UA  1.010 - 1.025   Blood, UA neg    pH, UA  5.0 - 8.0   POC Protein UA Trace Negative, Trace   Urobilinogen, UA  0.2 or 1.0 E.U./dL   Nitrite, UA neg    Leukocytes, UA Negative Negative   Appearance     Odor       Orders Placed This Encounter  Procedures  . US OB Comp + 14 Wk  . INTEGRATED 2  . POC Urinalysis Dipstick OB    Plan:  Continued routine obstetrical care,   Return in about 3 weeks (around 09/17/2017) for AV:[id number]:Anatomy, LROB.

## 2017-09-01 LAB — INTEGRATED 2
ADSF: 1.39
AFP MoM: 0.99
Alpha-Fetoprotein: 17.2 ng/mL
Crown Rump Length: 60 mm
DIA MoM: 1.37
DIA VALUE: 165.4 pg/mL
Estriol, Unconjugated: 0.96 ng/mL
Gest. Age on Collection Date: 12.3 weeks
Gestational Age: 16.3 weeks
HCG MOM: 2.06
HCG VALUE: 42.1 [IU]/mL
MATERNAL AGE AT EDD: 30.1 a
NUCHAL TRANSLUCENCY (NT): 1.3 mm
NUMBER OF FETUSES: 1
Nuchal Translucency MoM: 0.97
PAPP-A MoM: 1.18
PAPP-A Value: 437.3 ng/mL
Test Results:: NEGATIVE
WEIGHT: 290 [lb_av]
Weight: 290 [lb_av]

## 2017-09-17 ENCOUNTER — Other Ambulatory Visit: Payer: Self-pay

## 2017-09-17 ENCOUNTER — Ambulatory Visit (INDEPENDENT_AMBULATORY_CARE_PROVIDER_SITE_OTHER): Payer: Commercial Managed Care - PPO | Admitting: Advanced Practice Midwife

## 2017-09-17 ENCOUNTER — Ambulatory Visit (INDEPENDENT_AMBULATORY_CARE_PROVIDER_SITE_OTHER): Payer: Commercial Managed Care - PPO

## 2017-09-17 ENCOUNTER — Encounter: Payer: Self-pay | Admitting: Advanced Practice Midwife

## 2017-09-17 VITALS — BP 106/69 | HR 88 | Wt 292.0 lb

## 2017-09-17 DIAGNOSIS — Z331 Pregnant state, incidental: Secondary | ICD-10-CM

## 2017-09-17 DIAGNOSIS — Z363 Encounter for antenatal screening for malformations: Secondary | ICD-10-CM

## 2017-09-17 DIAGNOSIS — Z3402 Encounter for supervision of normal first pregnancy, second trimester: Secondary | ICD-10-CM

## 2017-09-17 DIAGNOSIS — Z1389 Encounter for screening for other disorder: Secondary | ICD-10-CM

## 2017-09-17 DIAGNOSIS — Z3A19 19 weeks gestation of pregnancy: Secondary | ICD-10-CM

## 2017-09-17 DIAGNOSIS — Z3482 Encounter for supervision of other normal pregnancy, second trimester: Secondary | ICD-10-CM

## 2017-09-17 LAB — POCT URINALYSIS DIPSTICK OB
Glucose, UA: NEGATIVE — AB
Ketones, UA: NEGATIVE
Leukocytes, UA: NEGATIVE
Nitrite, UA: NEGATIVE
POC,PROTEIN,UA: NEGATIVE
RBC UA: NEGATIVE

## 2017-09-17 NOTE — Progress Notes (Signed)
US 19+3 wks,cephalic,cx 4.2 cm,posterior pl gr 0,right oophorectomy,normal left ovary,svp of fluid 4.5 cm,fhr 140 bpm,efw 310 g,anatomy complete,no obvious abnormalities

## 2017-09-17 NOTE — Progress Notes (Signed)
  [id number] 2973w3d Estimated Date of Delivery: 02/08/18  Blood pressure 106/69, pulse 88, weight 292 lb (132.5 kg), last menstrual period 04/27/2017, not currently breastfeeding.   BP weight and urine results all reviewed and noted.  Please refer to the obstetrical flow sheet for the fundal height and fetal heart rate documentation:  Patient reports good fetal movement, denies any bleeding and no rupture of membranes symptoms or regular contractions. Patient is without complaints.other than normal pregnancy compliants All questions were answered.   Physical Assessment:   Vitals:   09/17/17 1422  BP: 106/69  Pulse: 88  Weight: 292 lb (132.5 kg)  Body mass index is 51.73 kg/m.        Physical Examination:   General appearance: Well appearing, and in no distress  Mental status: Alert, oriented to person, place, and time  Skin: Warm & dry  Cardiovascular: Normal heart rate noted  Respiratory: Normal respiratory effort, no distress  Abdomen: Soft, gravid, nontender  Pelvic: Cervical exam deferred         Extremities: Edema: Trace  Fetal Status: Fetal Heart Rate (bpm): 140   Movement: Present    Results for orders placed or performed in visit on 09/17/17 (from the past 24 hour(s))  POC Urinalysis Dipstick OB   Collection Time: 09/17/17  2:24 PM  Result Value Ref Range   Color, UA     Clarity, UA     Glucose, UA Negative (A) (none)   Bilirubin, UA     Ketones, UA neg    Spec Grav, UA  1.010 - 1.025   Blood, UA neg    pH, UA  5.0 - 8.0   POC Protein UA Negative Negative, Trace   Urobilinogen, UA  0.2 or 1.0 E.U./dL   Nitrite, UA neg    Leukocytes, UA Negative Negative   Appearance     Odor      US 19+3 wks,cephalic,cx 4.2 cm,posterior pl gr 0,right oophorectomy,normal left ovary,svp of fluid 4.5 cm,fhr 140 bpm,efw 310 g,anatomy complete,no obvious abnormalities    Orders Placed This Encounter  Procedures  . POC Urinalysis Dipstick OB    Plan:  Continued routine  obstetrical care,   No follow-ups on file.

## 2017-09-17 NOTE — Patient Instructions (Signed)
Restless leg syndrome   Kinesiology taping for pregnancy:  Youtube has good vidoes of "how tos" for lower back, pelvic, hip pain; swelling of feet, etc

## 2017-10-03 ENCOUNTER — Other Ambulatory Visit: Payer: Self-pay

## 2017-10-03 ENCOUNTER — Ambulatory Visit (INDEPENDENT_AMBULATORY_CARE_PROVIDER_SITE_OTHER): Payer: Commercial Managed Care - PPO | Admitting: Obstetrics & Gynecology

## 2017-10-03 ENCOUNTER — Encounter: Payer: Self-pay | Admitting: Obstetrics & Gynecology

## 2017-10-03 VITALS — BP 105/70 | HR 104 | Wt 291.0 lb

## 2017-10-03 DIAGNOSIS — Z1389 Encounter for screening for other disorder: Secondary | ICD-10-CM

## 2017-10-03 DIAGNOSIS — Z331 Pregnant state, incidental: Secondary | ICD-10-CM

## 2017-10-03 DIAGNOSIS — O9989 Other specified diseases and conditions complicating pregnancy, childbirth and the puerperium: Secondary | ICD-10-CM

## 2017-10-03 DIAGNOSIS — R87618 Other abnormal cytological findings on specimens from cervix uteri: Secondary | ICD-10-CM

## 2017-10-03 DIAGNOSIS — Z3482 Encounter for supervision of other normal pregnancy, second trimester: Secondary | ICD-10-CM

## 2017-10-03 DIAGNOSIS — N72 Inflammatory disease of cervix uteri: Secondary | ICD-10-CM | POA: Diagnosis not present

## 2017-10-03 DIAGNOSIS — Z3A21 21 weeks gestation of pregnancy: Secondary | ICD-10-CM

## 2017-10-03 LAB — POCT URINALYSIS DIPSTICK OB
Glucose, UA: NEGATIVE — AB
Ketones, UA: NEGATIVE
LEUKOCYTES UA: NEGATIVE
NITRITE UA: NEGATIVE
RBC UA: NEGATIVE

## 2017-10-03 MED ORDER — METRONIDAZOLE 500 MG PO TABS
500.0000 mg | ORAL_TABLET | Freq: Two times a day (BID) | ORAL | 0 refills | Status: DC
Start: 2017-10-03 — End: 2017-10-12

## 2017-10-03 NOTE — Progress Notes (Signed)
[id number] 552w5d Estimated Date of Delivery: 02/08/18  Blood pressure 105/70, pulse (!) 104, weight 291 lb (132 kg), last menstrual period 04/27/2017, not currently breastfeeding.   BP weight and urine results all reviewed and noted.  Please refer to the obstetrical flow sheet for the fundal height and fetal heart rate documentation:  Patient reports good fetal movement, denies any bleeding and no rupture of membranes symptoms or regular contractions. Patient is without complaints. All questions were answered.  Orders Placed This Encounter  Procedures  . POC Urinalysis Dipstick OB    Plan:  Continued routine obstetrical care,   Wet prep +WBC no yeast BV or trichomonas Ideally would treat with doxycycline but will treat with oral metronidazole 500 BID  Return in about 4 weeks (around 10/31/2017) for LROB.  Meds ordered this encounter  Medications  . metroNIDAZOLE (FLAGYL) 500 MG tablet    Sig: Take 1 tablet (500 mg total) by mouth 2 (two) times daily.    Dispense:  14 tablet    Refill:  0

## 2017-10-12 ENCOUNTER — Encounter (HOSPITAL_COMMUNITY): Payer: Self-pay | Admitting: *Deleted

## 2017-10-12 ENCOUNTER — Inpatient Hospital Stay (HOSPITAL_COMMUNITY)
Admission: AD | Admit: 2017-10-12 | Discharge: 2017-10-12 | Disposition: A | Discharge status: Home / Self Care | Payer: Commercial Managed Care - PPO | Source: Ambulatory Visit | Attending: Obstetrics and Gynecology | Admitting: Obstetrics and Gynecology

## 2017-10-12 DIAGNOSIS — M6208 Separation of muscle (nontraumatic), other site: Secondary | ICD-10-CM | POA: Diagnosis not present

## 2017-10-12 DIAGNOSIS — O9989 Other specified diseases and conditions complicating pregnancy, childbirth and the puerperium: Secondary | ICD-10-CM | POA: Diagnosis not present

## 2017-10-12 DIAGNOSIS — E039 Hypothyroidism, unspecified: Secondary | ICD-10-CM | POA: Insufficient documentation

## 2017-10-12 DIAGNOSIS — Z888 Allergy status to other drugs, medicaments and biological substances status: Secondary | ICD-10-CM | POA: Insufficient documentation

## 2017-10-12 DIAGNOSIS — Z3A23 23 weeks gestation of pregnancy: Secondary | ICD-10-CM | POA: Insufficient documentation

## 2017-10-12 DIAGNOSIS — Z87891 Personal history of nicotine dependence: Secondary | ICD-10-CM | POA: Insufficient documentation

## 2017-10-12 DIAGNOSIS — O99282 Endocrine, nutritional and metabolic diseases complicating pregnancy, second trimester: Secondary | ICD-10-CM | POA: Insufficient documentation

## 2017-10-12 DIAGNOSIS — O26892 Other specified pregnancy related conditions, second trimester: Secondary | ICD-10-CM | POA: Diagnosis not present

## 2017-10-12 DIAGNOSIS — Z79899 Other long term (current) drug therapy: Secondary | ICD-10-CM | POA: Insufficient documentation

## 2017-10-12 DIAGNOSIS — M549 Dorsalgia, unspecified: Secondary | ICD-10-CM | POA: Insufficient documentation

## 2017-10-12 DIAGNOSIS — Z79890 Hormone replacement therapy (postmenopausal): Secondary | ICD-10-CM | POA: Diagnosis not present

## 2017-10-12 DIAGNOSIS — Z3482 Encounter for supervision of other normal pregnancy, second trimester: Secondary | ICD-10-CM

## 2017-10-12 LAB — URINALYSIS, ROUTINE W REFLEX MICROSCOPIC
BILIRUBIN URINE: NEGATIVE
GLUCOSE, UA: NEGATIVE mg/dL
KETONES UR: NEGATIVE mg/dL
Nitrite: NEGATIVE
PH: 6 (ref 5.0–8.0)
Protein, ur: NEGATIVE mg/dL
Specific Gravity, Urine: 1.009 (ref 1.005–1.030)

## 2017-10-12 MED ORDER — CYCLOBENZAPRINE HCL 10 MG PO TABS
10.0000 mg | ORAL_TABLET | Freq: Once | ORAL | Status: AC
  Administered 2017-10-12: 10 mg via ORAL
  Filled 2017-10-12: qty 1

## 2017-10-12 NOTE — Discharge Instructions (Signed)
Purchase a maternity belt that comes up over the top of your abdomen and below your abdomen to be worn while awake and especially while at work. Also purchase support hose to wear work.

## 2017-10-12 NOTE — MAU Note (Signed)
Having pain in abd and back since Tues. Movement makes pain worse in general. Pain is mid abdomen and bra line of back. Pain is sharp and intermittent. Denies vag bleeding or LOF. Yellow mucous d/c. Just finished Flagyl for BV.

## 2017-10-12 NOTE — MAU Provider Note (Signed)
History     CSN: [id number]  Arrival date and time: 10/12/17 0020   First Provider Initiated Contact with Patient 10/12/17 0155      Chief Complaint  Patient presents with  . Contractions   HPI  Ms.  [name] is a 32 y.o. year old G53P2003 female at [redacted]w[redacted]d weeks gestation who presents to MAU reporting abdominal and back pain since Tuesday. She reports that FM makes the pain worse. The location of the pain is from her mid-abdomen (above bellybutton; "where by abdominal muscles separated when I was pregnant with my twins") radiating up to the bra line on her back. She describes the pain as sharp and intermittent. 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 She reports coming home She denies VB or LOF. She does have complaints of yellow mucous vaginal d/c, but she just finished taking Flagyl for BV.  Past Medical History:  Diagnosis Date  . Breast nodule 10/10/2014  . Breast pain 10/10/2014  . BV (bacterial vaginosis) 11/12/2012  . Fatty liver   . Hypothyroid 02/14/2015  . Irregular menses 05/21/2013  . Migraines   . Obesity   . Other and unspecified ovarian cyst 05/31/2013   Right complex ?cystic vs solid mass on ovary will schedule appt with JVF  . Pregnant 02/14/2015  . Thyroid disease     Past Surgical History:  Procedure Laterality Date  . CESAREAN SECTION    . COLONOSCOPY    . OOPHORECTOMY    . TONSILLECTOMY    . WISDOM TOOTH EXTRACTION      Family History  Problem Relation Age of Onset  . Diabetes Maternal Grandmother   . Hypertension Maternal Grandmother   . Cancer Maternal Grandmother        breast  . Diabetes Maternal Grandfather   . Hypertension Maternal Grandfather   . Leukemia Maternal Grandfather   . Diabetes Mother   . Hypertension Mother   . Heart attack Mother   . Stroke Mother   . Kidney disease Paternal Grandfather   . Seizures Father   . Other Father        back problems  .  Dementia Paternal Grandmother   . Cancer Maternal Aunt   . Diabetes Maternal Uncle     Social History   Tobacco Use  . Smoking status: Former Smoker    Packs/day: 0.25    Years: 8.00    Pack years: 2.00    Types: Cigarettes    Last attempt to quit: 10/07/2010    Years since quitting: 7.0  . Smokeless tobacco: Never Used  Substance Use Topics  . Alcohol use: No    Comment: occ.; not now  . Drug use: No    Allergies:  Allergies  Allergen Reactions  . Imitrex [Sumatriptan]     Makes migraines worse.   . Maxalt [Rizatriptan]     Makes migraines worse  . Phenergan [Promethazine Hcl] Nausea And Vomiting  . Tape Other (See Comments)    Blisters on abdomen after C Section    Medications Prior to Admission  Medication Sig Dispense Refill Last Dose  . acetaminophen (TYLENOL) 325 MG tablet Take 650 mg by mouth every 6 (six) hours as needed.   10/11/2017 at Unknown time  . levothyroxine (SYNTHROID, LEVOTHROID) 112 MCG tablet Take 1 tablet (112 mcg total) by mouth daily before breakfast. 30 tablet 3 10/11/2017 at Unknown time  . Prenat-FeCbn-FeAspGl-FA-Omega (  OB COMPLETE PETITE) 35-5-1-200 MG CAPS Take 1 capsule by mouth daily. 30 capsule 12 10/11/2017 at Unknown time  . metroNIDAZOLE (FLAGYL) 500 MG tablet Take 1 tablet (500 mg total) by mouth 2 (two) times daily. 14 tablet 0     Review of Systems  Constitutional: Negative.   HENT: Negative.   Eyes: Negative.   Respiratory: Negative.   Cardiovascular: Negative.   Gastrointestinal: Positive for abdominal pain.  Endocrine: Negative.   Genitourinary: Negative.   Musculoskeletal: Positive for back pain.  Skin: Negative.   Allergic/Immunologic: Negative.   Neurological: Negative.   Hematological: Negative.   Psychiatric/Behavioral: Negative.    Physical Exam   Blood pressure 114/69, pulse 87, temperature 98.1 F (36.7 C), resp. rate 20, height 5\' 2"  (1.575 m), weight 133.4 kg, last menstrual period 04/27/2017, not currently  breastfeeding.  Physical Exam  Nursing note and vitals reviewed. Constitutional: She is oriented to person, place, and time. She appears well-developed and well-nourished.  HENT:  Head: Normocephalic and atraumatic.  Eyes: Pupils are equal, round, and reactive to light.  Neck: Normal range of motion.  Cardiovascular: Normal rate.  Respiratory: Effort normal.  GI: Soft. There is tenderness.    Genitourinary:  Genitourinary Comments: deferred  Musculoskeletal: Normal range of motion.  Neurological: She is alert and oriented to person, place, and time.  Skin: Skin is warm and dry.  Psychiatric: She has a normal mood and affect. Her behavior is normal. Judgment and thought content normal.    MAU Course  Procedures  MDM CCUA Flexeril 10 mg po -- no improvement FHTs by doppler: 143 bpm  *Consult with Dr. Emelda Fear @ [phone] - notified of patient's complaints, assessments, lab & NST results, recommended tx plan OOW x 1 week & be reassessed at [facility] next week.  Results for orders placed or performed during the hospital encounter of 10/12/17 (from the past 24 hour(s))  Urinalysis, Routine w reflex microscopic     Status: Abnormal   Collection Time: 10/12/17  2:00 AM  Result Value Ref Range   Color, Urine YELLOW YELLOW   APPearance HAZY (A) CLEAR   Specific Gravity, Urine 1.009 1.005 - 1.030   pH 6.0 5.0 - 8.0   Glucose, UA NEGATIVE NEGATIVE mg/dL   Hgb urine dipstick SMALL (A) NEGATIVE   Bilirubin Urine NEGATIVE NEGATIVE   Ketones, ur NEGATIVE NEGATIVE mg/dL   Protein, ur NEGATIVE NEGATIVE mg/dL   Nitrite NEGATIVE NEGATIVE   Leukocytes, UA MODERATE (A) NEGATIVE   RBC / HPF 0-5 0 - 5 RBC/hpf   WBC, UA 6-10 0 - 5 WBC/hpf   Bacteria, UA FEW (A) NONE SEEN   Squamous Epithelial / LPF 11-20 0 - 5   Mucus PRESENT     Assessment and Plan  Diastasis of rectus abdominis  - Take Tylenol 1000 mg po every 6 hrs - Buy maternity support belt and wear support hose - Note to be OOW  x 1 wk per Dr. Emelda Fear - Discharge patient - Call to be seen at [facility] office later this week  Raelyn Mora, MSN, CNM 10/12/2017, 1:59 AM

## 2017-10-12 NOTE — Progress Notes (Signed)
Written and verbal d/c instructions given and understanding voiced. 

## 2017-10-15 ENCOUNTER — Encounter: Payer: Commercial Managed Care - PPO | Admitting: Women's Health

## 2017-10-20 ENCOUNTER — Encounter: Payer: Self-pay | Admitting: Obstetrics and Gynecology

## 2017-10-20 ENCOUNTER — Ambulatory Visit (INDEPENDENT_AMBULATORY_CARE_PROVIDER_SITE_OTHER): Payer: Commercial Managed Care - PPO | Admitting: Obstetrics and Gynecology

## 2017-10-20 VITALS — BP 116/79 | HR 93 | Wt 296.6 lb

## 2017-10-20 DIAGNOSIS — Z3A24 24 weeks gestation of pregnancy: Secondary | ICD-10-CM

## 2017-10-20 DIAGNOSIS — Z3482 Encounter for supervision of other normal pregnancy, second trimester: Secondary | ICD-10-CM

## 2017-10-20 DIAGNOSIS — Z1389 Encounter for screening for other disorder: Secondary | ICD-10-CM

## 2017-10-20 DIAGNOSIS — Z331 Pregnant state, incidental: Secondary | ICD-10-CM

## 2017-10-20 LAB — POCT URINALYSIS DIPSTICK OB
Glucose, UA: NEGATIVE
KETONES UA: NEGATIVE
NITRITE UA: NEGATIVE
PROTEIN: NEGATIVE

## 2017-10-20 NOTE — Progress Notes (Signed)
Patient ID: ERYKA DOLINGER, female   DOB: [birth year], 32 y.o.   MRN: [id number]    LOW-RISK PREGNANCY VISIT Patient name: [name] MRN [id number]  Date of birth: 1985-04-08 Chief Complaint:   Routine Prenatal Visit (went to [facility] last week with stomach pain; still having pain off and on )  History of Present Illness:   [name] is a 32 y.o. G18P2003 female at [redacted]w[redacted]d with an Estimated Date of Delivery: 02/08/18 being seen today for ongoing management of a low-risk pregnancy.Has diastasis of abdomen. Went to [facility] last week with abdominal; still having abdominal pain. If she stands too long, bends down or exerts too much energy she feels squeezing constant pain then it lets loose. Feels pain all in abdomen and in back. Had to leave work to go to emergency room, was given muscle relaxer but didn't help. Has tried tylenol to alleviate pain and helps her be able to walk through the house and sleep. Had chills when she went to hospital but hasn't had them since. She still feels the baby's movement. Has ordered maternity belt but hasn't come in yet. She hold up her stomach when she is at work and helps a little. When doing house chores she struggles and has to lay down afterwards. Before doc entering the room had trouble catching breath. Light palpating on abdomen is very uncomfortable but not painful.  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 She starts another job 9/16 with easier working condition. Would like to have her tubes tied Today she reports backache. Contractions: Irregular. Vag. Bleeding: None.  Movement: Present. denies leaking of fluid. Review of Systems:   Pertinent items are noted in HPI Denies abnormal vaginal discharge w/ itching/odor/irritation, headaches, visual changes, chest pain, severe nausea/vomiting, or problems with urination or bowel movements unless otherwise stated above. Pertinent History Reviewed:  Reviewed past medical,surgical,  social, obstetrical and family history.  Reviewed problem list, medications and allergies. Physical Assessment:   Vitals:   10/20/17 1436  BP: 116/79  Pulse: 93  Weight: 296 lb 9.6 oz (134.5 kg)  Body mass index is 54.25 kg/m.        Physical Examination:   General appearance: Well appearing, and in no distress  Mental status: Alert, oriented to person, place, and time  Skin: Warm & dry  Cardiovascular: Normal heart rate noted  Respiratory: Normal respiratory effort, no distress  Abdomen: Soft, gravid, nontender  Pelvic: Cervical exam deferred         Extremities: Edema: Trace  Fetal Status: Fetal Heart Rate (bpm): 150 Fundal Height: 24 cm Movement: Present    Results for orders placed or performed in visit on 10/20/17 (from the past 24 hour(s))  POC Urinalysis Dipstick OB   Collection Time: 10/20/17  2:32 PM  Result Value Ref Range   Color, UA     Clarity, UA     Glucose, UA Negative Negative   Bilirubin, UA     Ketones, UA neg    Spec Grav, UA     Blood, UA trace    pH, UA     POC Protein UA Negative Negative, Trace   Urobilinogen, UA     Nitrite, UA neg    Leukocytes, UA Trace (A) Negative   Appearance     Odor      Assessment & Plan:  1) Low-risk pregnancy G3P2003 at [redacted]w[redacted]d with an Estimated Date of Delivery: 02/08/18    Meds: No orders  of the defined types were placed in this encounter.  Labs/procedures today: none  Plan:  1.  Patient advised to pursue water aerobics to alleviate abdominal pain 2. Check thyroid function @28  weeks with 2nd PN2 and u/s:anatomy  Follow-up: No follow-ups on file.  Orders Placed This Encounter  Procedures  . POC Urinalysis Dipstick OB   By signing my name below, I, Arnette Norris, attest that this documentation has been prepared under the direction and in the presence of Tilda Burrow, MD. Electronically Signed: Arnette Norris Medical Scribe. 10/20/17. 3:09 PM.  I personally performed the services described in this  documentation, which was SCRIBED in my presence. The recorded information has been reviewed and considered accurate. It has been edited as necessary during review. Tilda Burrow, MD

## 2017-10-23 ENCOUNTER — Other Ambulatory Visit: Payer: Self-pay | Admitting: "Endocrinology

## 2017-10-24 LAB — T4, FREE: Free T4: 0.86 ng/dL (ref 0.82–1.77)

## 2017-10-24 LAB — TSH: TSH: 5.79 u[IU]/mL — AB (ref 0.450–4.500)

## 2017-10-27 ENCOUNTER — Other Ambulatory Visit: Payer: Self-pay | Admitting: "Endocrinology

## 2017-10-27 MED ORDER — LEVOTHYROXINE SODIUM 137 MCG PO TABS: 137.0000 ug | ORAL_TABLET | Freq: Every day | ORAL | 2 refills | Status: DC

## 2017-10-27 NOTE — Progress Notes (Signed)
Patient is aware of new dosage

## 2017-10-28 ENCOUNTER — Ambulatory Visit (INDEPENDENT_AMBULATORY_CARE_PROVIDER_SITE_OTHER): Payer: Commercial Managed Care - PPO | Admitting: "Endocrinology

## 2017-10-28 ENCOUNTER — Encounter: Payer: Self-pay | Admitting: "Endocrinology

## 2017-10-28 VITALS — BP 118/79 | HR 81 | Ht 63.0 in | Wt 293.0 lb

## 2017-10-28 DIAGNOSIS — E038 Other specified hypothyroidism: Secondary | ICD-10-CM

## 2017-10-28 DIAGNOSIS — E063 Autoimmune thyroiditis: Secondary | ICD-10-CM | POA: Diagnosis not present

## 2017-10-28 NOTE — Progress Notes (Signed)
Patient is aware of new dosage           Endocrinology follow-up note                                            10/28/2017, 12:36 PM   Subjective:    Patient ID: [name], female    DOB: 15-Jul-1985, PCP Richardean Chimera, MD   Past Medical History:  Diagnosis Date  . Breast nodule 10/10/2014  . Breast pain 10/10/2014  . BV (bacterial vaginosis) 11/12/2012  . Fatty liver   . Hypothyroid 02/14/2015  . Irregular menses 05/21/2013  . Migraines   . Obesity   . Other and unspecified ovarian cyst 05/31/2013   Right complex ?cystic vs solid mass on ovary will schedule appt with JVF  . Pregnant 02/14/2015  . Thyroid disease    Past Surgical History:  Procedure Laterality Date  . CESAREAN SECTION    . COLONOSCOPY    . OOPHORECTOMY    . TONSILLECTOMY    . WISDOM TOOTH EXTRACTION     Social History   Socioeconomic History  . Marital status: Single    Spouse name: Not on file  . Number of children: Not on file  . Years of education: Not on file  . Highest education level: Not on file  Occupational History  . Not on file  Social Needs  . Financial resource strain: Not on file  . Food insecurity:    Worry: Not on file    Inability: Not on file  . Transportation needs:    Medical: Not on file    Non-medical: Not on file  Tobacco Use  . Smoking status: Former Smoker    Packs/day: 0.25    Years: 8.00    Pack years: 2.00    Types: Cigarettes    Last attempt to quit: 10/07/2010    Years since quitting: 7.0  . Smokeless tobacco: Never Used  Substance and Sexual Activity  . Alcohol use: No    Comment: occ.; not now  . Drug use: No  . Sexual activity: Yes    Birth control/protection: None  Lifestyle  . Physical activity:    Days per week: Not on file    Minutes per session: Not on file  . Stress: Not on file  Relationships  . Social connections:    Talks on phone: Not on file    Gets together: Not on file    Attends religious service: Not on file    Active member of  club or organization: Not on file    Attends meetings of clubs or organizations: Not on file    Relationship status: Not on file  Other Topics Concern  . Not on file  Social History Narrative  . Not on file   Outpatient Encounter Medications as of 10/28/2017  Medication Sig  . acetaminophen (TYLENOL) 325 MG tablet Take 650 mg by mouth every 6 (six) hours as needed.  Marland Kitchen levothyroxine (SYNTHROID, LEVOTHROID) 137 MCG tablet Take 1 tablet (137 mcg total) by mouth daily before breakfast.  . Prenat-FeCbn-FeAspGl-FA-Omega (OB COMPLETE PETITE) 35-5-1-200 MG CAPS Take 1 capsule by mouth daily.   No facility-administered encounter medications on file as of 10/28/2017.    ALLERGIES: Allergies  Allergen Reactions  . Imitrex [Sumatriptan]     Makes migraines worse.   . Maxalt [Rizatriptan]  Makes migraines worse  . Phenergan [Promethazine Hcl] Nausea And Vomiting  . Tape Other (See Comments)    Blisters on abdomen after C Section    VACCINATION STATUS: There is no immunization history for the selected administration types on file for this patient.  HPI [name] is 32 y.o. female who presents today with repeat thyroid function test.  She has a medical history of long-standing hypothyroidism now confirmed to be secondary to Hashimoto's thyroiditis.   -She has been on levothyroxine 112 mcg p.o. every morning until she was called to pick up a higher dose of 137 mcg after her labs showed TSH still above target. -She is currently on her third pregnancy at 25 weeks of gestation, reportedly progressing very well.  She has gained 4 pounds since last visit. -She is more consistent taking her thyroid hormone this time.    -Historically, her thyroid hormone replacement have been interrupted several times for different reasons including lack of insurance.  -She denies cold intolerance.   -She denies any family history of thyroid dysfunction.  She denies dysphagia, odynophagia, hoarseness of  voice.  Denies family history of thyroid cancer. -She has 2 healthy children at home.  Review of Systems  Constitutional: + Gained 4 pounds,   no fatigue, no subjective hyperthermia, no subjective hypothermia Eyes: no blurry vision, no xerophthalmia ENT: no sore throat, no nodules palpated in throat, no dysphagia/odynophagia, no hoarseness Cardiovascular: no Chest Pain, no Shortness of Breath, no palpitations, no leg swelling Respiratory: no cough, no SOB Gastrointestinal: no nausea, vomiting, diarrhea.   Musculoskeletal: no muscle/joint aches Skin: no rashes Neurological: no tremors, no numbness, no tingling, no dizziness Psychiatric: no depression, no anxiety  Objective:    BP 118/79   Pulse 81   Ht 5\' 3"  (1.6 m)   Wt 293 lb (132.9 kg)   LMP 04/27/2017 (Exact Date)   BMI 51.90 kg/m   Wt Readings from Last 3 Encounters:  10/28/17 293 lb (132.9 kg)  10/20/17 296 lb 9.6 oz (134.5 kg)  10/12/17 294 lb (133.4 kg)    Physical Exam  Constitutional: + Second trimester pregnancy at 25 weeks,  not in acute distress, normal state of mind Eyes: PERRLA, EOMI, no exophthalmos ENT: moist mucous membranes, no thyromegaly, no cervical lymphadenopathy  Musculoskeletal: no gross deformities, strength intact in all four extremities Skin: moist, warm, no rashes Neurological: no tremor with outstretched hands, Deep tendon reflexes normal in all four extremities.  CMP ( most recent) CMP     Component Value Date/Time   NA 138 06/28/2015 1124   K 3.9 06/28/2015 1124   CL 104 06/28/2015 1124   CO2 19 06/28/2015 1124   GLUCOSE 82 06/28/2015 1124   GLUCOSE 91 10/06/2013 1015   BUN 6 06/28/2015 1124   CREATININE 0.43 (L) 06/28/2015 1124   CALCIUM 8.4 (L) 06/28/2015 1124   PROT 5.9 (L) 06/28/2015 1124   ALBUMIN 3.2 (L) 06/28/2015 1124   AST 13 06/28/2015 1124   ALT 13 06/28/2015 1124   ALKPHOS 170 (H) 06/28/2015 1124   BILITOT 0.3 06/28/2015 1124   GFRNONAA 140 06/28/2015 1124   GFRAA  161 06/28/2015 1124     Lab Results  Component Value Date   TSH 5.790 (H) 10/23/2017   TSH 12.260 (H) 08/07/2017   TSH 11.970 (H) 07/02/2017   TSH 4.970 (H) 08/03/2015   TSH 5.360 (H) 06/28/2015   TSH 6.340 (H) 05/31/2015   TSH 2.560 04/04/2015   TSH 7.160 (H) 03/07/2015  TSH 12.660 (H) 02/14/2015   TSH 2.998 05/21/2013   FREET4 0.86 10/23/2017   FREET4 1.09 08/07/2017     Assessment & Plan:   1. Hypothyroidism due to Hashimoto's thyroiditis  - [name]  is currently pregnant at [redacted] weeks of gestation.  She was called for higher dose of levothyroxine at 137 mcg p.o. every morning after her labs show high TSH of 5.7, although improving from 12.26.  Her free T4 is still low normal at 0.86.  She would benefit from higher dose of levothyroxine.  - We discussed about correct intake of levothyroxine, at fasting, with water, separated by at least 30 minutes from breakfast, and separated by more than 4 hours from calcium, iron, multivitamins, acid reflux medications (PPIs). -Patient is made aware of the fact that thyroid hormone replacement is needed for life, dose to be adjusted by periodic monitoring of thyroid function tests.   She has significant medical history of noncompliance/nonadherence and recurrent interruption seen her thyroid hormone replacement.  -For the long-term, I discussed with her the need for these thyroid hormone replacement on daily basis and the fact that she cannot come off of the thyroid hormone under any circumstances.  For cost reasons, I advised her to stay on generic levothyroxine instead of brand Synthroid.  - I advised her  to maintain close follow up with Richardean Chimera, MD for primary care needs as well as Dr. Emelda Fear for OB/GYN care.  Follow up plan: Return in about 5 weeks (around 12/02/2017) for Follow up with Pre-visit Labs.   Marquis Lunch, MD [facility] [address] [place],  [place] [address] Phone: [phone]  Fax: [phone]     10/28/2017, 12:36 PM  This note was partially dictated with voice recognition software. Similar sounding words can be transcribed inadequately or may not  be corrected upon review.

## 2017-10-31 ENCOUNTER — Ambulatory Visit (INDEPENDENT_AMBULATORY_CARE_PROVIDER_SITE_OTHER): Payer: Commercial Managed Care - PPO | Admitting: Women's Health

## 2017-10-31 ENCOUNTER — Encounter: Payer: Self-pay | Admitting: Women's Health

## 2017-10-31 VITALS — BP 106/71 | HR 95 | Temp 98.2°F | Wt 295.2 lb

## 2017-10-31 DIAGNOSIS — Z3A25 25 weeks gestation of pregnancy: Secondary | ICD-10-CM

## 2017-10-31 DIAGNOSIS — Z331 Pregnant state, incidental: Secondary | ICD-10-CM

## 2017-10-31 DIAGNOSIS — Z3482 Encounter for supervision of other normal pregnancy, second trimester: Secondary | ICD-10-CM

## 2017-10-31 DIAGNOSIS — Z1389 Encounter for screening for other disorder: Secondary | ICD-10-CM

## 2017-10-31 DIAGNOSIS — O34219 Maternal care for unspecified type scar from previous cesarean delivery: Secondary | ICD-10-CM

## 2017-10-31 DIAGNOSIS — E039 Hypothyroidism, unspecified: Secondary | ICD-10-CM

## 2017-10-31 DIAGNOSIS — O99282 Endocrine, nutritional and metabolic diseases complicating pregnancy, second trimester: Secondary | ICD-10-CM

## 2017-10-31 LAB — POCT URINALYSIS DIPSTICK OB
Blood, UA: NEGATIVE
Glucose, UA: NEGATIVE
KETONES UA: NEGATIVE
LEUKOCYTES UA: NEGATIVE
NITRITE UA: NEGATIVE

## 2017-10-31 NOTE — Progress Notes (Signed)
   LOW-RISK PREGNANCY VISIT Patient name: Lisa GoodnightKatelyn C Michaelson MRN [id number]  Date of birth: 1986/01/04 Chief Complaint:   Routine Prenatal Visit (head congestion)  History of Present Illness:   Lisa GoodnightKatelyn C Jeangilles is a 32 y.o. 593P2003 female at 366w5d with an Estimated Date of Delivery: 02/08/18 being seen today for ongoing management of a low-risk pregnancy.  Today she reports head congestion started this am, no other sx. Saw Dr. Fransico HimNida this week, TSH down to 5, he increased synthroid to 137mcg- sees him again in 5wks. Wants TOLAC. Contractions: Not present.  .  Movement: Present. denies leaking of fluid. Review of Systems:   Pertinent items are noted in HPI Denies abnormal vaginal discharge w/ itching/odor/irritation, headaches, visual changes, shortness of breath, chest pain, abdominal pain, severe nausea/vomiting, or problems with urination or bowel movements unless otherwise stated above. Pertinent History Reviewed:  Reviewed past medical,surgical, social, obstetrical and family history.  Reviewed problem list, medications and allergies. Physical Assessment:   Vitals:   10/31/17 0945  BP: 106/71  Pulse: 95  Temp: 98.2 F (36.8 C)  Weight: 295 lb 3.2 oz (133.9 kg)  Body mass index is 52.29 kg/m.        Physical Examination:   General appearance: Well appearing, and in no distress  Mental status: Alert, oriented to person, place, and time  Skin: Warm & dry  Cardiovascular: Normal heart rate noted  Respiratory: Normal respiratory effort, no distress  Abdomen: Soft, gravid, nontender  Pelvic: Cervical exam deferred         Extremities: Edema: Trace  Fetal Status: Fetal Heart Rate (bpm): 150 Fundal Height: 20 cm from U Movement: Present    Results for orders placed or performed in visit on 10/31/17 (from the past 24 hour(s))  POC Urinalysis Dipstick OB   Collection Time: 10/31/17  9:50 AM  Result Value Ref Range   Color, UA     Clarity, UA     Glucose, UA Negative Negative   Bilirubin, UA     Ketones, UA neg    Spec Grav, UA     Blood, UA neg    pH, UA     POC Protein UA Small (1+) (A) Negative, Trace   Urobilinogen, UA     Nitrite, UA neg    Leukocytes, UA Negative Negative   Appearance     Odor      Assessment & Plan:  1) Low-risk pregnancy G3P2003 at 376w5d with an Estimated Date of Delivery: 02/08/18   2) Prev c/s, then VBAC, wants TOLAC, reviewed risks/benefits- consent signed today  3) Head cold> gave printed relief measures, call if not improving/worsening in 7-10d  4) Hypothyroid> followed by Dr. Fransico HimNida, continue synthroid 137mcg daily, f/u w/ him as scheduled in 5wks   Meds: No orders of the defined types were placed in this encounter.  Labs/procedures today: getting flu shot at work  Plan:  Continue routine obstetrical care   Reviewed: Preterm labor symptoms and general obstetric precautions including but not limited to vaginal bleeding, contractions, leaking of fluid and fetal movement were reviewed in detail with the patient.  All questions were answered  Follow-up: Return in about 3 weeks (around 11/21/2017) for LROB, PN2.  Orders Placed This Encounter  Procedures  . POC Urinalysis Dipstick OB   Cheral MarkerKimberly R Keatin Benham CNM, [facility] CenterWHNP-BC 10/31/2017 10:32 AM

## 2017-10-31 NOTE — Patient Instructions (Addendum)
Neita Goodnight, I greatly value your feedback.  If you receive a survey following your visit with [place] today, we appreciate you taking the time to fill it out.  Thanks, [name], CNM, WHNP-BC   You will have your sugar test next visit.  Please do not eat or drink anything after midnight the night before you come, not even water.  You will be here for at least two hours.     Head cold Symptoms typically last 3-7 days but can stretch out to 2-3 weeks.  Unfortunately, antibiotics are not helpful for viral infections.  Humidifier and saline nasal spray for nasal congestion  Regular robitussin, cough drops for cough  Warm salt water gargles for sore throat  Mucinex with lots of water to help you cough up the mucous in your chest if needed  Sudafed  Drink plenty of fluids and stay hydrated!  Wash your hands frequently.  Call if you are not improving by 7-10 days.     Call the office [phone]) or go to [facility] if:  You begin to have strong, frequent contractions  Your water breaks.  Sometimes it is a big gush of fluid, sometimes it is just a trickle that keeps getting your panties wet or running down your legs  You have vaginal bleeding.  It is normal to have a small amount of spotting if your cervix was checked.   You don't feel your baby moving like normal.  If you don't, get you something to eat and drink and lay down and focus on feeling your baby move.   If your baby is still not moving like normal, you should call the office or go to [facility].  Second Trimester of Pregnancy The second trimester is from week 13 through week 28, months 4 through 6. The second trimester is often a time when you feel your best. Your body has also adjusted to being pregnant, and you begin to feel better physically. Usually, morning sickness has lessened or quit completely, you may have more energy, and you may have an increase in appetite. The second trimester is also a time when the  fetus is growing rapidly. At the end of the sixth month, the fetus is about 9 inches long and weighs about 1 pounds. You will likely begin to feel the baby move (quickening) between 18 and 20 weeks of the pregnancy. BODY CHANGES Your body goes through many changes during pregnancy. The changes vary from woman to woman.   Your weight will continue to increase. You will notice your lower abdomen bulging out.  You may begin to get stretch marks on your hips, abdomen, and breasts.  You may develop headaches that can be relieved by medicines approved by your health care provider.  You may urinate more often because the fetus is pressing on your bladder.  You may develop or continue to have heartburn as a result of your pregnancy.  You may develop constipation because certain hormones are causing the muscles that push waste through your intestines to slow down.  You may develop hemorrhoids or swollen, bulging veins (varicose veins).  You may have back pain because of the weight gain and pregnancy hormones relaxing your joints between the bones in your pelvis and as a result of a shift in weight and the muscles that support your balance.  Your breasts will continue to grow and be tender.  Your gums may bleed and may be sensitive to brushing and flossing.  Dark  spots or blotches (chloasma, mask of pregnancy) may develop on your face. This will likely fade after the baby is born.  A dark line from your belly button to the pubic area (linea nigra) may appear. This will likely fade after the baby is born.  You may have changes in your hair. These can include thickening of your hair, rapid growth, and changes in texture. Some women also have hair loss during or after pregnancy, or hair that feels dry or thin. Your hair will most likely return to normal after your baby is born. WHAT TO EXPECT AT YOUR PRENATAL VISITS During a routine prenatal visit:  You will be weighed to make sure you and the  fetus are growing normally.  Your blood pressure will be taken.  Your abdomen will be measured to track your baby's growth.  The fetal heartbeat will be listened to.  Any test results from the previous visit will be discussed. Your health care provider may ask you:  How you are feeling.  If you are feeling the baby move.  If you have had any abnormal symptoms, such as leaking fluid, bleeding, severe headaches, or abdominal cramping.  If you have any questions. Other tests that may be performed during your second trimester include:  Blood tests that check for:  Low iron levels (anemia).  Gestational diabetes (between 24 and 28 weeks).  Rh antibodies.  Urine tests to check for infections, diabetes, or protein in the urine.  An ultrasound to confirm the proper growth and development of the baby.  An amniocentesis to check for possible genetic problems.  Fetal screens for spina bifida and Down syndrome. HOME CARE INSTRUCTIONS   Avoid all smoking, herbs, alcohol, and unprescribed drugs. These chemicals affect the formation and growth of the baby.  Follow your health care provider's instructions regarding medicine use. There are medicines that are either safe or unsafe to take during pregnancy.  Exercise only as directed by your health care provider. Experiencing uterine cramps is a good sign to stop exercising.  Continue to eat regular, healthy meals.  Wear a good support bra for breast tenderness.  Do not use hot tubs, steam rooms, or saunas.  Wear your seat belt at all times when driving.  Avoid raw meat, uncooked cheese, cat litter boxes, and soil used by cats. These carry germs that can cause birth defects in the baby.  Take your prenatal vitamins.  Try taking a stool softener (if your health care provider approves) if you develop constipation. Eat more high-fiber foods, such as fresh vegetables or fruit and whole grains. Drink plenty of fluids to keep your urine  clear or pale yellow.  Take warm sitz baths to soothe any pain or discomfort caused by hemorrhoids. Use hemorrhoid cream if your health care provider approves.  If you develop varicose veins, wear support hose. Elevate your feet for 15 minutes, 3-4 times a day. Limit salt in your diet.  Avoid heavy lifting, wear low heel shoes, and practice good posture.  Rest with your legs elevated if you have leg cramps or low back pain.  Visit your dentist if you have not gone yet during your pregnancy. Use a soft toothbrush to brush your teeth and be gentle when you floss.  A sexual relationship may be continued unless your health care provider directs you otherwise.  Continue to go to all your prenatal visits as directed by your health care provider. SEEK MEDICAL CARE IF:   You have dizziness.  You  have mild pelvic cramps, pelvic pressure, or nagging pain in the abdominal area.  You have persistent nausea, vomiting, or diarrhea.  You have a bad smelling vaginal discharge.  You have pain with urination. SEEK IMMEDIATE MEDICAL CARE IF:   You have a fever.  You are leaking fluid from your vagina.  You have spotting or bleeding from your vagina.  You have severe abdominal cramping or pain.  You have rapid weight gain or loss.  You have shortness of breath with chest pain.  You notice sudden or extreme swelling of your face, hands, ankles, feet, or legs.  You have not felt your baby move in over an hour.  You have severe headaches that do not go away with medicine.  You have vision changes. Document Released: 01/22/2001 Document Revised: 02/02/2013 Document Reviewed: 03/31/2012 [facility] Patient Information 2015 [place]. This information is not intended to replace advice given to you by your health care provider. Make sure you discuss any questions you have with your health care provider.

## 2017-11-21 ENCOUNTER — Other Ambulatory Visit: Payer: Commercial Managed Care - PPO

## 2017-11-21 ENCOUNTER — Ambulatory Visit (INDEPENDENT_AMBULATORY_CARE_PROVIDER_SITE_OTHER): Payer: Commercial Managed Care - PPO | Admitting: Women's Health

## 2017-11-21 ENCOUNTER — Encounter: Payer: Self-pay | Admitting: Women's Health

## 2017-11-21 VITALS — BP 125/87 | HR 99 | Wt 295.6 lb

## 2017-11-21 DIAGNOSIS — Z331 Pregnant state, incidental: Secondary | ICD-10-CM

## 2017-11-21 DIAGNOSIS — Z3A28 28 weeks gestation of pregnancy: Secondary | ICD-10-CM | POA: Diagnosis not present

## 2017-11-21 DIAGNOSIS — Z131 Encounter for screening for diabetes mellitus: Secondary | ICD-10-CM

## 2017-11-21 DIAGNOSIS — Z3483 Encounter for supervision of other normal pregnancy, third trimester: Secondary | ICD-10-CM

## 2017-11-21 DIAGNOSIS — Z1389 Encounter for screening for other disorder: Secondary | ICD-10-CM

## 2017-11-21 DIAGNOSIS — O34219 Maternal care for unspecified type scar from previous cesarean delivery: Secondary | ICD-10-CM

## 2017-11-21 DIAGNOSIS — Z23 Encounter for immunization: Secondary | ICD-10-CM

## 2017-11-21 LAB — POCT URINALYSIS DIPSTICK OB
Glucose, UA: NEGATIVE
KETONES UA: NEGATIVE
LEUKOCYTES UA: NEGATIVE
NITRITE UA: NEGATIVE
RBC UA: NEGATIVE

## 2017-11-21 NOTE — Progress Notes (Signed)
   LOW-RISK PREGNANCY VISIT Patient name: [name] MRN [id number]  Date of birth: 23-Apr-1985 Chief Complaint:   Routine Prenatal Visit (PN2)  History of Present Illness:   [name] is a 32 y.o. G69P2003 female at [redacted]w[redacted]d with an Estimated Date of Delivery: 02/08/18 being seen today for ongoing management of a low-risk pregnancy.  Today she reports occ cramping, hips/pelvis hurt. Contractions: Not present.  .  Movement: Present. denies leaking of fluid. Review of Systems:   Pertinent items are noted in HPI Denies abnormal vaginal discharge w/ itching/odor/irritation, headaches, visual changes, shortness of breath, chest pain, abdominal pain, severe nausea/vomiting, or problems with urination or bowel movements unless otherwise stated above. Pertinent History Reviewed:  Reviewed past medical,surgical, social, obstetrical and family history.  Reviewed problem list, medications and allergies. Physical Assessment:   Vitals:   11/21/17 0914  BP: 125/87  Pulse: 99  Weight: 295 lb 9.6 oz (134.1 kg)  Body mass index is 52.36 kg/m.        Physical Examination:   General appearance: Well appearing, and in no distress  Mental status: Alert, oriented to person, place, and time  Skin: Warm & dry  Cardiovascular: Normal heart rate noted  Respiratory: Normal respiratory effort, no distress  Abdomen: Soft, gravid, nontender  Pelvic: Cervical exam deferred         Extremities: Edema: None  Fetal Status: Fetal Heart Rate (bpm): 138 Fundal Height: 25 cm from U Movement: Present    Results for orders placed or performed in visit on 11/21/17 (from the past 24 hour(s))  POC Urinalysis Dipstick OB   Collection Time: 11/21/17  9:16 AM  Result Value Ref Range   Color, UA     Clarity, UA     Glucose, UA Negative Negative   Bilirubin, UA     Ketones, UA neg    Spec Grav, UA     Blood, UA neg    pH, UA     POC Protein UA Trace Negative, Trace   Urobilinogen, UA     Nitrite, UA neg    Leukocytes, UA Negative Negative   Appearance     Odor      Assessment & Plan:  1) Low-risk pregnancy G3P2003 at [redacted]w[redacted]d with an Estimated Date of Delivery: 02/08/18   2) Prev c/s, for TOLAC, consent previously signed  3) Hypothyroidism> on synthroid followed by Dr. Fransico Him, goes back to him 10/22  4) Wants BTL> interval d/t hernia per pt, reviewed risks/benefits, consent signed today   Meds: No orders of the defined types were placed in this encounter.  Labs/procedures today: pn2, tdap, getting flu shot next week at work  Plan:  Continue routine obstetrical care   Reviewed: Preterm labor symptoms and general obstetric precautions including but not limited to vaginal bleeding, contractions, leaking of fluid and fetal movement were reviewed in detail with the patient.  All questions were answered  Follow-up: Return in about 4 weeks (around 12/19/2017) for LROB, Sign BTL consent today.  Orders Placed This Encounter  Procedures  . Tdap vaccine greater than or equal to 7yo IM  . POC Urinalysis Dipstick OB   Cheral Marker CNM, [facility] 11/21/2017 9:54 AM

## 2017-11-21 NOTE — Patient Instructions (Signed)
[name], I greatly value your feedback.  If you receive a survey following your visit with [place] today, we appreciate you taking the time to fill it out.  Thanks, [name], CNM, WHNP-BC   Call the office [phone]) or go to [facility] if:  You begin to have strong, frequent contractions  Your water breaks.  Sometimes it is a big gush of fluid, sometimes it is just a trickle that keeps getting your panties wet or running down your legs  You have vaginal bleeding.  It is normal to have a small amount of spotting if your cervix was checked.   You don't feel your baby moving like normal.  If you don't, get you something to eat and drink and lay down and focus on feeling your baby move.  You should feel at least 10 movements in 2 hours.  If you don't, you should call the office or go to [facility].    Tdap Vaccine  It is recommended that you get the Tdap vaccine during the third trimester of EACH pregnancy to help protect your baby from getting pertussis (whooping cough)  27-36 weeks is the BEST time to do this so that you can pass the protection on to your baby. During pregnancy is better than after pregnancy, but if you are unable to get it during pregnancy it will be offered at the hospital.   You can get this vaccine with [place], at the health department, your family doctor, or some local pharmacies  Everyone who will be around your baby should also be up-to-date on their vaccines before the baby comes. Adults (who are not pregnant) only need 1 dose of Tdap during adulthood.   Third Trimester of Pregnancy The third trimester is from week 29 through week 42, months 7 through 9. The third trimester is a time when the fetus is growing rapidly. At the end of the ninth month, the fetus is about 20 inches in length and weighs 6-10 pounds.  BODY CHANGES Your body goes through many changes during pregnancy. The changes vary from woman to woman.   Your weight will continue to  increase. You can expect to gain 25-35 pounds (11-16 kg) by the end of the pregnancy.  You may begin to get stretch marks on your hips, abdomen, and breasts.  You may urinate more often because the fetus is moving lower into your pelvis and pressing on your bladder.  You may develop or continue to have heartburn as a result of your pregnancy.  You may develop constipation because certain hormones are causing the muscles that push waste through your intestines to slow down.  You may develop hemorrhoids or swollen, bulging veins (varicose veins).  You may have pelvic pain because of the weight gain and pregnancy hormones relaxing your joints between the bones in your pelvis. Backaches may result from overexertion of the muscles supporting your posture.  You may have changes in your hair. These can include thickening of your hair, rapid growth, and changes in texture. Some women also have hair loss during or after pregnancy, or hair that feels dry or thin. Your hair will most likely return to normal after your baby is born.  Your breasts will continue to grow and be tender. A yellow discharge may leak from your breasts called colostrum.  Your belly button may stick out.  You may feel short of breath because of your expanding uterus.  You may notice the fetus "dropping," or moving lower  in your abdomen.  You may have a bloody mucus discharge. This usually occurs a few days to a week before labor begins.  Your cervix becomes thin and soft (effaced) near your due date. WHAT TO EXPECT AT YOUR PRENATAL EXAMS  You will have prenatal exams every 2 weeks until week 36. Then, you will have weekly prenatal exams. During a routine prenatal visit:  You will be weighed to make sure you and the fetus are growing normally.  Your blood pressure is taken.  Your abdomen will be measured to track your baby's growth.  The fetal heartbeat will be listened to.  Any test results from the previous visit  will be discussed.  You may have a cervical check near your due date to see if you have effaced. At around 36 weeks, your caregiver will check your cervix. At the same time, your caregiver will also perform a test on the secretions of the vaginal tissue. This test is to determine if a type of bacteria, Group B streptococcus, is present. Your caregiver will explain this further. Your caregiver may ask you:  What your birth plan is.  How you are feeling.  If you are feeling the baby move.  If you have had any abnormal symptoms, such as leaking fluid, bleeding, severe headaches, or abdominal cramping.  If you have any questions. Other tests or screenings that may be performed during your third trimester include:  Blood tests that check for low iron levels (anemia).  Fetal testing to check the health, activity level, and growth of the fetus. Testing is done if you have certain medical conditions or if there are problems during the pregnancy. FALSE LABOR You may feel small, irregular contractions that eventually go away. These are called Braxton Hicks contractions, or false labor. Contractions may last for hours, days, or even weeks before true labor sets in. If contractions come at regular intervals, intensify, or become painful, it is best to be seen by your caregiver.  SIGNS OF LABOR   Menstrual-like cramps.  Contractions that are 5 minutes apart or less.  Contractions that start on the top of the uterus and spread down to the lower abdomen and back.  A sense of increased pelvic pressure or back pain.  A watery or bloody mucus discharge that comes from the vagina. If you have any of these signs before the 37th week of pregnancy, call your caregiver right away. You need to go to the hospital to get checked immediately. HOME CARE INSTRUCTIONS   Avoid all smoking, herbs, alcohol, and unprescribed drugs. These chemicals affect the formation and growth of the baby.  Follow your  caregiver's instructions regarding medicine use. There are medicines that are either safe or unsafe to take during pregnancy.  Exercise only as directed by your caregiver. Experiencing uterine cramps is a good sign to stop exercising.  Continue to eat regular, healthy meals.  Wear a good support bra for breast tenderness.  Do not use hot tubs, steam rooms, or saunas.  Wear your seat belt at all times when driving.  Avoid raw meat, uncooked cheese, cat litter boxes, and soil used by cats. These carry germs that can cause birth defects in the baby.  Take your prenatal vitamins.  Try taking a stool softener (if your caregiver approves) if you develop constipation. Eat more high-fiber foods, such as fresh vegetables or fruit and whole grains. Drink plenty of fluids to keep your urine clear or pale yellow.  Take warm sitz baths   to soothe any pain or discomfort caused by hemorrhoids. Use hemorrhoid cream if your caregiver approves.  If you develop varicose veins, wear support hose. Elevate your feet for 15 minutes, 3-4 times a day. Limit salt in your diet.  Avoid heavy lifting, wear low heal shoes, and practice good posture.  Rest a lot with your legs elevated if you have leg cramps or low back pain.  Visit your dentist if you have not gone during your pregnancy. Use a soft toothbrush to brush your teeth and be gentle when you floss.  A sexual relationship may be continued unless your caregiver directs you otherwise.  Do not travel far distances unless it is absolutely necessary and only with the approval of your caregiver.  Take prenatal classes to understand, practice, and ask questions about the labor and delivery.  Make a trial run to the hospital.  Pack your hospital bag.  Prepare the baby's nursery.  Continue to go to all your prenatal visits as directed by your caregiver. SEEK MEDICAL CARE IF:  You are unsure if you are in labor or if your water has broken.  You have  dizziness.  You have mild pelvic cramps, pelvic pressure, or nagging pain in your abdominal area.  You have persistent nausea, vomiting, or diarrhea.  You have a bad smelling vaginal discharge.  You have pain with urination. SEEK IMMEDIATE MEDICAL CARE IF:   You have a fever.  You are leaking fluid from your vagina.  You have spotting or bleeding from your vagina.  You have severe abdominal cramping or pain.  You have rapid weight loss or gain.  You have shortness of breath with chest pain.  You notice sudden or extreme swelling of your face, hands, ankles, feet, or legs.  You have not felt your baby move in over an hour.  You have severe headaches that do not go away with medicine.  You have vision changes. Document Released: 01/22/2001 Document Revised: 02/02/2013 Document Reviewed: 03/31/2012 [facility] Patient Information 2015 [place], [place]. This information is not intended to replace advice given to you by your health care provider. Make sure you discuss any questions you have with your health care provider.

## 2017-11-22 LAB — CBC
HEMOGLOBIN: 12.2 g/dL (ref 11.1–15.9)
Hematocrit: 36.4 % (ref 34.0–46.6)
MCH: 29.4 pg (ref 26.6–33.0)
MCHC: 33.5 g/dL (ref 31.5–35.7)
MCV: 88 fL (ref 79–97)
PLATELETS: 299 10*3/uL (ref 150–450)
RBC: 4.15 x10E6/uL (ref 3.77–5.28)
RDW: 13.1 % (ref 12.3–15.4)
WBC: 9.1 10*3/uL (ref 3.4–10.8)

## 2017-11-22 LAB — GLUCOSE TOLERANCE, 2 HOURS W/ 1HR
GLUCOSE, 1 HOUR: 117 mg/dL (ref 65–179)
GLUCOSE, 2 HOUR: 71 mg/dL (ref 65–152)
GLUCOSE, FASTING: 78 mg/dL (ref 65–91)

## 2017-11-22 LAB — ANTIBODY SCREEN: ANTIBODY SCREEN: NEGATIVE

## 2017-11-22 LAB — HIV ANTIBODY (ROUTINE TESTING W REFLEX): HIV SCREEN 4TH GENERATION: NONREACTIVE

## 2017-11-22 LAB — RPR: RPR: NONREACTIVE

## 2017-11-27 ENCOUNTER — Other Ambulatory Visit: Payer: Self-pay | Admitting: "Endocrinology

## 2017-11-28 ENCOUNTER — Other Ambulatory Visit: Payer: Self-pay | Admitting: "Endocrinology

## 2017-11-28 MED ORDER — LEVOTHYROXINE SODIUM 150 MCG PO TABS: 150.0000 ug | ORAL_TABLET | Freq: Every day | ORAL | 3 refills | Status: DC

## 2017-11-29 LAB — T4F+T3FREE
FREE T-3: 2.7 pg/mL
Free Thyroxine: 0.97 ng/dL
TRIIODOTHYRONINE (T-3), SERUM: 204 ng/dL — AB
Thyroxine (T4): 10.6 ug/dL

## 2017-11-29 LAB — TSH: TSH: 5.47 u[IU]/mL — ABNORMAL HIGH (ref 0.450–4.500)

## 2017-12-03 ENCOUNTER — Encounter: Payer: Self-pay | Admitting: "Endocrinology

## 2017-12-03 ENCOUNTER — Ambulatory Visit (INDEPENDENT_AMBULATORY_CARE_PROVIDER_SITE_OTHER): Payer: Commercial Managed Care - PPO | Admitting: "Endocrinology

## 2017-12-03 VITALS — BP 116/81 | HR 83 | Ht 63.0 in | Wt 295.0 lb

## 2017-12-03 DIAGNOSIS — E038 Other specified hypothyroidism: Secondary | ICD-10-CM | POA: Diagnosis not present

## 2017-12-03 DIAGNOSIS — E063 Autoimmune thyroiditis: Secondary | ICD-10-CM

## 2017-12-03 NOTE — Progress Notes (Signed)
Patient is aware of new dosage           Endocrinology follow-up note                                            12/03/2017, 4:50 PM   Subjective:    Patient ID: [name], female    DOB: 01-12-1986, PCP Richardean Chimera, MD   Past Medical History:  Diagnosis Date  . Breast nodule 10/10/2014  . Breast pain 10/10/2014  . BV (bacterial vaginosis) 11/12/2012  . Fatty liver   . Hypothyroid 02/14/2015  . Irregular menses 05/21/2013  . Migraines   . Obesity   . Other and unspecified ovarian cyst 05/31/2013   Right complex ?cystic vs solid mass on ovary will schedule appt with [facility]  . Pregnant 02/14/2015  . Thyroid disease    Past Surgical History:  Procedure Laterality Date  . CESAREAN SECTION    . COLONOSCOPY    . OOPHORECTOMY    . TONSILLECTOMY    . WISDOM TOOTH EXTRACTION     Social History   Socioeconomic History  . Marital status: Single    Spouse name: Not on file  . Number of children: Not on file  . Years of education: Not on file  . Highest education level: Not on file  Occupational History  . Not on file  Social Needs  . Financial resource strain: Not on file  . Food insecurity:    Worry: Not on file    Inability: Not on file  . Transportation needs:    Medical: Not on file    Non-medical: Not on file  Tobacco Use  . Smoking status: Former Smoker    Packs/day: 0.25    Years: 8.00    Pack years: 2.00    Types: Cigarettes    Last attempt to quit: 10/07/2010    Years since quitting: 7.1  . Smokeless tobacco: Never Used  Substance and Sexual Activity  . Alcohol use: No    Comment: occ.; not now  . Drug use: No  . Sexual activity: Yes    Birth control/protection: None  Lifestyle  . Physical activity:    Days per week: Not on file    Minutes per session: Not on file  . Stress: Not on file  Relationships  . Social connections:    Talks on phone: Not on file    Gets together: Not on file    Attends religious service: Not on file    Active member of  club or organization: Not on file    Attends meetings of clubs or organizations: Not on file    Relationship status: Not on file  Other Topics Concern  . Not on file  Social History Narrative  . Not on file   Outpatient Encounter Medications as of 12/03/2017  Medication Sig  . acetaminophen (TYLENOL) 325 MG tablet Take 650 mg by mouth every 6 (six) hours as needed.  Marland Kitchen levothyroxine (SYNTHROID, LEVOTHROID) 150 MCG tablet Take 1 tablet (150 mcg total) by mouth daily before breakfast.  . Prenat-FeCbn-FeAspGl-FA-Omega (OB COMPLETE PETITE) 35-5-1-200 MG CAPS Take 1 capsule by mouth daily.   No facility-administered encounter medications on file as of 12/03/2017.    ALLERGIES: Allergies  Allergen Reactions  . Imitrex [Sumatriptan]     Makes migraines worse.   . Maxalt [Rizatriptan]  Makes migraines worse  . Phenergan [Promethazine Hcl] Nausea And Vomiting  . Tape Other (See Comments)    Blisters on abdomen after C Section    VACCINATION STATUS: Immunization History  Administered Date(s) Administered  . Tdap 11/21/2017    HPI [name] is 32 y.o. female who presents today with repeat thyroid function test.  She has a medical history of long-standing hypothyroidism now confirmed to be secondary to Hashimoto's thyroiditis.   -She has been on levothyroxine 137 mcg p.o. every morning until she was called to pick up a higher dose of 150 mcg after her labs showed TSH still above target. She has not picked up yet. -She is currently on her third pregnancy at 30 weeks of gestation, reportedly progressing very well.  She has gained 2 pounds since last visit. -She is more consistent taking her thyroid hormone this time.    -Historically, her thyroid hormone replacement have been interrupted several times for different reasons including lack of insurance.  -She denies cold intolerance.   -She denies any family history of thyroid dysfunction.  She denies dysphagia, odynophagia,  hoarseness of voice.  Denies family history of thyroid cancer. -She has 2 healthy children at home.  Review of Systems  Constitutional: + Gained 2 pounds ( overall 10 lbs),   + fatigue, no subjective hyperthermia, no subjective hypothermia Eyes: no blurry vision, no xerophthalmia ENT: no sore throat, no nodules palpated in throat, no dysphagia/odynophagia, no hoarseness Cardiovascular: no Chest Pain, no Shortness of Breath, no palpitations, no leg swelling    Musculoskeletal: no muscle/joint aches Skin: no rashes Neurological: no tremors, no numbness, no tingling, no dizziness Psychiatric: no depression, no anxiety  Objective:    BP 116/81   Pulse 83   Ht 5\' 3"  (1.6 m)   Wt 295 lb (133.8 kg)   LMP 04/27/2017 (Exact Date)   BMI 52.26 kg/m   Wt Readings from Last 3 Encounters:  12/03/17 295 lb (133.8 kg)  11/21/17 295 lb 9.6 oz (134.1 kg)  10/31/17 295 lb 3.2 oz (133.9 kg)    Physical Exam  Constitutional: + Second trimester pregnancy at 25 weeks,  not in acute distress, normal state of mind Abd: Gravid Uterus Eyes: PERRLA, EOMI, no exophthalmos ENT: moist mucous membranes, no thyromegaly, no cervical lymphadenopathy  Musculoskeletal: no gross deformities, strength intact in all four extremities Skin: moist, warm, no rashes Neurological: no tremor with outstretched hands, Deep tendon reflexes normal in all four extremities.  CMP ( most recent) CMP     Component Value Date/Time   NA 138 06/28/2015 1124   K 3.9 06/28/2015 1124   CL 104 06/28/2015 1124   CO2 19 06/28/2015 1124   GLUCOSE 82 06/28/2015 1124   GLUCOSE 91 10/06/2013 1015   BUN 6 06/28/2015 1124   CREATININE 0.43 (L) 06/28/2015 1124   CALCIUM 8.4 (L) 06/28/2015 1124   PROT 5.9 (L) 06/28/2015 1124   ALBUMIN 3.2 (L) 06/28/2015 1124   AST 13 06/28/2015 1124   ALT 13 06/28/2015 1124   ALKPHOS 170 (H) 06/28/2015 1124   BILITOT 0.3 06/28/2015 1124   GFRNONAA 140 06/28/2015 1124   GFRAA 161 06/28/2015 1124      Lab Results  Component Value Date   TSH 5.470 (H) 11/27/2017   TSH 5.790 (H) 10/23/2017   TSH 12.260 (H) 08/07/2017   TSH 11.970 (H) 07/02/2017   TSH 4.970 (H) 08/03/2015   TSH 5.360 (H) 06/28/2015   TSH 6.340 (H) 05/31/2015  TSH 2.560 04/04/2015   TSH 7.160 (H) 03/07/2015   TSH 12.660 (H) 02/14/2015   FREET4 0.86 10/23/2017   FREET4 1.09 08/07/2017     Assessment & Plan:   1. Hypothyroidism due to Hashimoto's thyroiditis  - LUA FENG  is currently pregnant at [redacted] weeks of gestation.  She was called for higher dose of levothyroxine at 150 mcg p.o. every morning after her labs show high TSH of 5.4, although improving from 12.26. -She has not picked up yet. -She is advised to continue on levothyroxine 150 mcg p.o. every morning.   - We discussed about correct intake of levothyroxine, at fasting, with water, separated by at least 30 minutes from breakfast, and separated by more than 4 hours from calcium, iron, multivitamins, acid reflux medications (PPIs). -Patient is made aware of the fact that thyroid hormone replacement is needed for life, dose to be adjusted by periodic monitoring of thyroid function tests.   She has significant medical history of noncompliance/nonadherence and recurrent interruption seen her thyroid hormone replacement.  -For the long-term, I discussed with her the need for these thyroid hormone replacement on daily basis and the fact that she cannot come off of the thyroid hormone under any circumstances.  For cost reasons, I advised her to stay on generic levothyroxine instead of brand Synthroid.  - I advised her  to maintain close follow up with Richardean Chimera, MD for primary care needs as well as Dr. Emelda Fear for OB/GYN care.  Follow up plan: Return in about 5 weeks (around 01/07/2018) for Follow up with Pre-visit Labs.   Marquis Lunch, MD [facility] [address] [place], [place]  [address] Phone: [phone]  Fax: [phone]     12/03/2017, 4:50 PM  This note was partially dictated with voice recognition software. Similar sounding words can be transcribed inadequately or may not  be corrected upon review.

## 2017-12-14 ENCOUNTER — Inpatient Hospital Stay (HOSPITAL_COMMUNITY)
Admission: AD | Admit: 2017-12-14 | Discharge: 2017-12-14 | Disposition: A | Discharge status: Home / Self Care | Payer: Commercial Managed Care - PPO | Source: Ambulatory Visit | Attending: Obstetrics and Gynecology | Admitting: Obstetrics and Gynecology

## 2017-12-14 ENCOUNTER — Encounter (HOSPITAL_COMMUNITY): Payer: Self-pay | Admitting: *Deleted

## 2017-12-14 DIAGNOSIS — O26893 Other specified pregnancy related conditions, third trimester: Secondary | ICD-10-CM | POA: Diagnosis not present

## 2017-12-14 DIAGNOSIS — O26892 Other specified pregnancy related conditions, second trimester: Secondary | ICD-10-CM | POA: Insufficient documentation

## 2017-12-14 DIAGNOSIS — R51 Headache: Secondary | ICD-10-CM | POA: Diagnosis not present

## 2017-12-14 DIAGNOSIS — Z3A32 32 weeks gestation of pregnancy: Secondary | ICD-10-CM | POA: Insufficient documentation

## 2017-12-14 DIAGNOSIS — Z87891 Personal history of nicotine dependence: Secondary | ICD-10-CM | POA: Insufficient documentation

## 2017-12-14 DIAGNOSIS — R519 Headache, unspecified: Secondary | ICD-10-CM

## 2017-12-14 HISTORY — DX: Major depressive disorder, single episode, unspecified: F32.9

## 2017-12-14 HISTORY — DX: Depression, unspecified: F32.A

## 2017-12-14 MED ORDER — ACETAMINOPHEN 500 MG PO TABS
1000.0000 mg | ORAL_TABLET | Freq: Once | ORAL | Status: AC
  Administered 2017-12-14: 1000 mg via ORAL
  Filled 2017-12-14: qty 2

## 2017-12-14 NOTE — MAU Note (Addendum)
Past couple wks have had headaches and during h/a I feel "off". Have hx migraines but usually with those I do not like anyone to touch me. These headaches are different. At times have no idea what I am talking about. Trying to call on -call doc this am could not go thru alphabet on phone. These symptoms have been going on for wks. And getting concerned with symptoms. Denies vag bleeding or LOF. White vag d/c which is not new. Pt drove self to hosp. Responds appropriately in Triage to questions. Has not taken med for h/a. "By the time I get to where I can take something h/a is gone." Occ itching "all over"

## 2017-12-14 NOTE — Progress Notes (Signed)
Written and verbal d/c instructions given and understanding voiced. To call neurologist and schedule an appt asap and pt voices understanding. Return to MAU if symptoms worsen

## 2017-12-14 NOTE — Progress Notes (Signed)
Venia Carbon NP notified pt feels some better after taking Tylenol. Pt stable for d/c home. NP had previously talked with pt regarding POC.

## 2017-12-14 NOTE — Progress Notes (Signed)
RN went in to give pt tylenol and pt had removed EFM. OK with J Rasch NP to leave pt off EFM. Pt aware

## 2017-12-14 NOTE — Discharge Instructions (Signed)

## 2017-12-14 NOTE — MAU Provider Note (Signed)
History     CSN: [id number]  Arrival date and time: 12/14/17 2956   First Provider Initiated Contact with Patient 12/14/17 0428      Chief Complaint  Patient presents with  . Headache  . Altered Mental Status   HPI   [name] is a 32 y.o. female G49P2003 @ [redacted]w[redacted]d with a history of migraines here with a HA that started 2 weeks ago. She has a history of migraine HA;s however this is different. At times when she has a HA she has confusion and feels like she can't remember things. She has not taken any medication for the HA because by the time she gets around to taking something the HA is gone. The HA comes and goes and has been doing this for 2 weeks. She has not seen neurology in years. Currently the HA is located in the front of her forehead. She currently rates her pain 8/10, She works 3rd shift and has been up all night. Multiple allergies to migraine medications.   Says she came in because she is worried about eclampsia.   OB History    Gravida  3   Para  2   Term  2   Preterm      AB      Living  3     SAB      TAB      Ectopic      Multiple  1   Live Births  3           Past Medical History:  Diagnosis Date  . Breast nodule 10/10/2014  . Breast pain 10/10/2014  . BV (bacterial vaginosis) 11/12/2012  . Depression   . Fatty liver   . Hypothyroid 02/14/2015  . Irregular menses 05/21/2013  . Migraines   . Obesity   . Other and unspecified ovarian cyst 05/31/2013   Right complex ?cystic vs solid mass on ovary will schedule appt with [facility]  . Pregnant 02/14/2015  . Thyroid disease     Past Surgical History:  Procedure Laterality Date  . CESAREAN SECTION    . COLONOSCOPY    . OOPHORECTOMY    . TONSILLECTOMY    . WISDOM TOOTH EXTRACTION      Family History  Problem Relation Age of Onset  . Diabetes Maternal Grandmother   . Hypertension Maternal Grandmother   . Cancer Maternal Grandmother        breast  . Diabetes Maternal Grandfather   .  Hypertension Maternal Grandfather   . Leukemia Maternal Grandfather   . Diabetes Mother   . Hypertension Mother   . Heart attack Mother   . Stroke Mother   . Kidney disease Paternal Grandfather   . Seizures Father   . Other Father        back problems  . Dementia Paternal Grandmother   . Cancer Maternal Aunt   . Diabetes Maternal Uncle     Social History   Tobacco Use  . Smoking status: Former Smoker    Packs/day: 0.25    Years: 8.00    Pack years: 2.00    Types: Cigarettes    Last attempt to quit: 10/07/2010    Years since quitting: 7.1  . Smokeless tobacco: Never Used  Substance Use Topics  . Alcohol use: No    Comment: occ.; not now  . Drug use: No    Allergies:  Allergies  Allergen Reactions  . Imitrex [Sumatriptan]     Makes migraines  worse.   . Maxalt [Rizatriptan]     Makes migraines worse  . Phenergan [Promethazine Hcl] Nausea And Vomiting  . Tape Other (See Comments)    Blisters on abdomen after C Section    Medications Prior to Admission  Medication Sig Dispense Refill Last Dose  . Lysine 1000 MG TABS Take by mouth.   Past Month at Unknown time  . acetaminophen (TYLENOL) 325 MG tablet Take 650 mg by mouth every 6 (six) hours as needed.   Taking  . levothyroxine (SYNTHROID, LEVOTHROID) 150 MCG tablet Take 1 tablet (150 mcg total) by mouth daily before breakfast. 30 tablet 3   . Prenat-FeCbn-FeAspGl-FA-Omega (OB COMPLETE PETITE) 35-5-1-200 MG CAPS Take 1 capsule by mouth daily. 30 capsule 12 Taking   No results found for this or any previous visit (from the past 48 hour(s)).  Review of Systems  Eyes: Negative for photophobia.  Neurological: Positive for headaches.   Physical Exam   Blood pressure (!) 111/50, pulse (!) 102, temperature 97.7 F (36.5 C), resp. rate 18, height 5\' 3"  (1.6 m), weight 135.6 kg, last menstrual period 04/27/2017, SpO2 98 %.  Physical Exam  Constitutional: She is oriented to person, place, and time. She appears  well-developed and well-nourished. No distress.  HENT:  Head: Normocephalic.  GI: Soft. She exhibits no distension. There is no tenderness. There is no rebound.  Musculoskeletal: Normal range of motion.  Neurological: She is alert and oriented to person, place, and time. She has normal strength and normal reflexes. GCS eye subscore is 4. GCS verbal subscore is 5. GCS motor subscore is 6.  Skin: Skin is warm. She is not diaphoretic.  Psychiatric: Her behavior is normal.   Fetal Tracing: Baseline: 130 bpm Variability: Moderate  Accelerations: 15x15 Decelerations: None Toco: None  MAU Course  Procedures  None  MDM  Patient without altered mental status at this time.  Tylenol given 1 gram PO. HA pain down from 8/10 to 5/10, patient says she feels better.  BP WNL   Assessment and Plan   A:  1. Headache in pregnancy, antepartum, third trimester   2. [redacted] weeks gestation of pregnancy    P:  Discharge home in stable condition Return to MAU if symptoms worsen F/U with [facility] Ok to use tylenol as needed, if symptoms worsen  Jovani Colquhoun, Harolyn Rutherford, NP 12/15/2017 11:59 AM

## 2017-12-15 NOTE — Telephone Encounter (Signed)
Patient seen in MAU 

## 2017-12-19 ENCOUNTER — Encounter: Payer: Self-pay | Admitting: Women's Health

## 2017-12-19 ENCOUNTER — Ambulatory Visit (INDEPENDENT_AMBULATORY_CARE_PROVIDER_SITE_OTHER): Payer: Commercial Managed Care - PPO | Admitting: Women's Health

## 2017-12-19 ENCOUNTER — Other Ambulatory Visit: Payer: Self-pay

## 2017-12-19 VITALS — BP 104/70 | HR 91 | Wt 294.0 lb

## 2017-12-19 DIAGNOSIS — L299 Pruritus, unspecified: Secondary | ICD-10-CM

## 2017-12-19 DIAGNOSIS — Z331 Pregnant state, incidental: Secondary | ICD-10-CM

## 2017-12-19 DIAGNOSIS — Z1389 Encounter for screening for other disorder: Secondary | ICD-10-CM

## 2017-12-19 DIAGNOSIS — Z3A32 32 weeks gestation of pregnancy: Secondary | ICD-10-CM

## 2017-12-19 DIAGNOSIS — Z3483 Encounter for supervision of other normal pregnancy, third trimester: Secondary | ICD-10-CM

## 2017-12-19 LAB — POCT URINALYSIS DIPSTICK OB
Glucose, UA: NEGATIVE
Ketones, UA: NEGATIVE
LEUKOCYTES UA: NEGATIVE
NITRITE UA: NEGATIVE
POC,PROTEIN,UA: NEGATIVE
RBC UA: NEGATIVE

## 2017-12-19 NOTE — Progress Notes (Signed)
   LOW-RISK PREGNANCY VISIT Patient name: [name] MRN [id number]  Date of birth: 31-Mar-1985 Chief Complaint:   Routine Prenatal Visit  History of Present Illness:   [name] is a 32 y.o. G60P2003 female at [redacted]w[redacted]d with an Estimated Date of Delivery: 02/08/18 being seen today for ongoing management of a low-risk pregnancy.  Today she reports itching 'all over', worse at night, not necessarily on soles/palms. More frequent headaches, some only last briefly- not even long enough to take meds, some longer and apap 1gm helps. Sometimes feels confused/disoriented during headaches. FOB states he hasn't noticed this. Contractions: Not present. Vag. Bleeding: None.  Movement: Present. denies leaking of fluid. Review of Systems:   Pertinent items are noted in HPI Denies abnormal vaginal discharge w/ itching/odor/irritation, headaches, visual changes, shortness of breath, chest pain, abdominal pain, severe nausea/vomiting, or problems with urination or bowel movements unless otherwise stated above. Pertinent History Reviewed:  Reviewed past medical,surgical, social, obstetrical and family history.  Reviewed problem list, medications and allergies. Physical Assessment:   Vitals:   12/19/17 1100  BP: 104/70  Pulse: 91  Weight: 294 lb (133.4 kg)  Body mass index is 52.08 kg/m.        Physical Examination:   General appearance: Well appearing, and in no distress  Mental status: Alert, oriented to person, place, and time  Skin: Warm & dry  Cardiovascular: Normal heart rate noted  Respiratory: Normal respiratory effort, no distress  Abdomen: Soft, gravid, nontender  Pelvic: Cervical exam deferred         Extremities: Edema: Trace  Fetal Status: Fetal Heart Rate (bpm): 135 Fundal Height: 26 cm from U Movement: Present    Results for orders placed or performed in visit on 12/19/17 (from the past 24 hour(s))  POC Urinalysis Dipstick OB   Collection Time: 12/19/17 10:59 AM  Result Value  Ref Range   Color, UA     Clarity, UA     Glucose, UA Negative Negative   Bilirubin, UA     Ketones, UA neg    Spec Grav, UA     Blood, UA neg    pH, UA     POC,PROTEIN,UA Negative Negative, Trace   Urobilinogen, UA     Nitrite, UA neg    Leukocytes, UA Negative Negative   Appearance     Odor      Assessment & Plan:  1) Low-risk pregnancy G3P2003 at [redacted]w[redacted]d with an Estimated Date of Delivery: 02/08/18   2) Generalized pruritus, will get fasting labs in am (not fasting today)  3) Headaches> w/ ?confusion/disorientation, keep journal, gave printed prevention/relief measures. May need neuro referral   Meds: No orders of the defined types were placed in this encounter.  Labs/procedures today: none  Plan:  Continue routine obstetrical care   Reviewed: Preterm labor symptoms and general obstetric precautions including but not limited to vaginal bleeding, contractions, leaking of fluid and fetal movement were reviewed in detail with the patient.  All questions were answered  Follow-up: Return for am labs, then 2wks LROB.  Orders Placed This Encounter  Procedures  . Comprehensive metabolic panel  . Bile acids, total  . POC Urinalysis Dipstick OB   Cheral Marker CNM, [facility] 12/19/2017 11:19 AM

## 2017-12-19 NOTE — Patient Instructions (Addendum)
Neita Goodnight, I greatly value your feedback.  If you receive a survey following your visit with [place] today, we appreciate you taking the time to fill it out.  Thanks, [name], CNM, WHNP-BC   Call the office [phone]) or go to [facility] if:  You begin to have strong, frequent contractions  Your water breaks.  Sometimes it is a big gush of fluid, sometimes it is just a trickle that keeps getting your panties wet or running down your legs  You have vaginal bleeding.  It is normal to have a small amount of spotting if your cervix was checked.   You don't feel your baby moving like normal.  If you don't, get you something to eat and drink and lay down and focus on feeling your baby move.  You should feel at least 10 movements in 2 hours.  If you don't, you should call the office or go to [facility].    Tdap Vaccine  It is recommended that you get the Tdap vaccine during the third trimester of EACH pregnancy to help protect your baby from getting pertussis (whooping cough)  27-36 weeks is the BEST time to do this so that you can pass the protection on to your baby. During pregnancy is better than after pregnancy, but if you are unable to get it during pregnancy it will be offered at the hospital.   You can get this vaccine with [place], at the health department, your family doctor, or some local pharmacies  Everyone who will be around your baby should also be up-to-date on their vaccines before the baby comes. Adults (who are not pregnant) only need 1 dose of Tdap during adulthood.   For Headaches:   Stay well hydrated, drink enough water so that your urine is clear, sometimes if you are dehydrated you can get headaches  Eat small frequent meals and snacks, sometimes if you are hungry you can get headaches  Sometimes you get headaches during pregnancy from the pregnancy hormones  You can try tylenol (1-2 regular strength 325mg  or 1-2 extra strength 500mg ) as directed on the  box. The least amount of medication that works is best.   Cool compresses (cool wet washcloth or ice pack) to area of head that is hurting  You can also try drinking a caffeinated drink to see if this will help  If not helping, try below:  For Prevention of Headaches/Migraines:  CoQ10 100mg  three times daily  Vitamin B2 400mg  daily  Magnesium Oxide 400-600mg  daily  If You Get a Bad Headache/Migraine:  Benadryl 25mg    Magnesium Oxide  1 large Gatorade  2 extra strength Tylenol (1,000mg  total)  1 cup coffee or Coke  If this doesn't help please call us @ [phone]    Third Trimester of Pregnancy The third trimester is from week 29 through week 42, months 7 through 9. The third trimester is a time when the fetus is growing rapidly. At the end of the ninth month, the fetus is about 20 inches in length and weighs 6-10 pounds.  BODY CHANGES Your body goes through many changes during pregnancy. The changes vary from woman to woman.   Your weight will continue to increase. You can expect to gain 25-35 pounds (11-16 kg) by the end of the pregnancy.  You may begin to get stretch marks on your hips, abdomen, and breasts.  You may urinate more often because the fetus is moving lower into your pelvis and pressing on  your bladder.  You may develop or continue to have heartburn as a result of your pregnancy.  You may develop constipation because certain hormones are causing the muscles that push waste through your intestines to slow down.  You may develop hemorrhoids or swollen, bulging veins (varicose veins).  You may have pelvic pain because of the weight gain and pregnancy hormones relaxing your joints between the bones in your pelvis. Backaches may result from overexertion of the muscles supporting your posture.  You may have changes in your hair. These can include thickening of your hair, rapid growth, and changes in texture. Some women also have hair loss during or after  pregnancy, or hair that feels dry or thin. Your hair will most likely return to normal after your baby is born.  Your breasts will continue to grow and be tender. A yellow discharge may leak from your breasts called colostrum.  Your belly button may stick out.  You may feel short of breath because of your expanding uterus.  You may notice the fetus "dropping," or moving lower in your abdomen.  You may have a bloody mucus discharge. This usually occurs a few days to a week before labor begins.  Your cervix becomes thin and soft (effaced) near your due date. WHAT TO EXPECT AT YOUR PRENATAL EXAMS  You will have prenatal exams every 2 weeks until week 36. Then, you will have weekly prenatal exams. During a routine prenatal visit:  You will be weighed to make sure you and the fetus are growing normally.  Your blood pressure is taken.  Your abdomen will be measured to track your baby's growth.  The fetal heartbeat will be listened to.  Any test results from the previous visit will be discussed.  You may have a cervical check near your due date to see if you have effaced. At around 36 weeks, your caregiver will check your cervix. At the same time, your caregiver will also perform a test on the secretions of the vaginal tissue. This test is to determine if a type of bacteria, Group B streptococcus, is present. Your caregiver will explain this further. Your caregiver may ask you:  What your birth plan is.  How you are feeling.  If you are feeling the baby move.  If you have had any abnormal symptoms, such as leaking fluid, bleeding, severe headaches, or abdominal cramping.  If you have any questions. Other tests or screenings that may be performed during your third trimester include:  Blood tests that check for low iron levels (anemia).  Fetal testing to check the health, activity level, and growth of the fetus. Testing is done if you have certain medical conditions or if there are  problems during the pregnancy. FALSE LABOR You may feel small, irregular contractions that eventually go away. These are called Braxton Hicks contractions, or false labor. Contractions may last for hours, days, or even weeks before true labor sets in. If contractions come at regular intervals, intensify, or become painful, it is best to be seen by your caregiver.  SIGNS OF LABOR   Menstrual-like cramps.  Contractions that are 5 minutes apart or less.  Contractions that start on the top of the uterus and spread down to the lower abdomen and back.  A sense of increased pelvic pressure or back pain.  A watery or bloody mucus discharge that comes from the vagina. If you have any of these signs before the 37th week of pregnancy, call your caregiver right away.  You need to go to the hospital to get checked immediately. HOME CARE INSTRUCTIONS   Avoid all smoking, herbs, alcohol, and unprescribed drugs. These chemicals affect the formation and growth of the baby.  Follow your caregiver's instructions regarding medicine use. There are medicines that are either safe or unsafe to take during pregnancy.  Exercise only as directed by your caregiver. Experiencing uterine cramps is a good sign to stop exercising.  Continue to eat regular, healthy meals.  Wear a good support bra for breast tenderness.  Do not use hot tubs, steam rooms, or saunas.  Wear your seat belt at all times when driving.  Avoid raw meat, uncooked cheese, cat litter boxes, and soil used by cats. These carry germs that can cause birth defects in the baby.  Take your prenatal vitamins.  Try taking a stool softener (if your caregiver approves) if you develop constipation. Eat more high-fiber foods, such as fresh vegetables or fruit and whole grains. Drink plenty of fluids to keep your urine clear or pale yellow.  Take warm sitz baths to soothe any pain or discomfort caused by hemorrhoids. Use hemorrhoid cream if your caregiver  approves.  If you develop varicose veins, wear support hose. Elevate your feet for 15 minutes, 3-4 times a day. Limit salt in your diet.  Avoid heavy lifting, wear low heal shoes, and practice good posture.  Rest a lot with your legs elevated if you have leg cramps or low back pain.  Visit your dentist if you have not gone during your pregnancy. Use a soft toothbrush to brush your teeth and be gentle when you floss.  A sexual relationship may be continued unless your caregiver directs you otherwise.  Do not travel far distances unless it is absolutely necessary and only with the approval of your caregiver.  Take prenatal classes to understand, practice, and ask questions about the labor and delivery.  Make a trial run to the hospital.  Pack your hospital bag.  Prepare the baby's nursery.  Continue to go to all your prenatal visits as directed by your caregiver. SEEK MEDICAL CARE IF:  You are unsure if you are in labor or if your water has broken.  You have dizziness.  You have mild pelvic cramps, pelvic pressure, or nagging pain in your abdominal area.  You have persistent nausea, vomiting, or diarrhea.  You have a bad smelling vaginal discharge.  You have pain with urination. SEEK IMMEDIATE MEDICAL CARE IF:   You have a fever.  You are leaking fluid from your vagina.  You have spotting or bleeding from your vagina.  You have severe abdominal cramping or pain.  You have rapid weight loss or gain.  You have shortness of breath with chest pain.  You notice sudden or extreme swelling of your face, hands, ankles, feet, or legs.  You have not felt your baby move in over an hour.  You have severe headaches that do not go away with medicine.  You have vision changes. Document Released: 01/22/2001 Document Revised: 02/02/2013 Document Reviewed: 03/31/2012 [facility] Patient Information 2015 [place], [place]. This information is not intended to replace advice given to  you by your health care provider. Make sure you discuss any questions you have with your health care provider.

## 2017-12-22 ENCOUNTER — Encounter: Payer: Self-pay | Admitting: Family Medicine

## 2017-12-22 ENCOUNTER — Ambulatory Visit (INDEPENDENT_AMBULATORY_CARE_PROVIDER_SITE_OTHER): Payer: Commercial Managed Care - PPO | Admitting: Family Medicine

## 2017-12-22 VITALS — BP 103/65 | HR 90 | Wt 296.6 lb

## 2017-12-22 DIAGNOSIS — E063 Autoimmune thyroiditis: Secondary | ICD-10-CM

## 2017-12-22 DIAGNOSIS — Z331 Pregnant state, incidental: Secondary | ICD-10-CM

## 2017-12-22 DIAGNOSIS — O99283 Endocrine, nutritional and metabolic diseases complicating pregnancy, third trimester: Secondary | ICD-10-CM

## 2017-12-22 DIAGNOSIS — Z3483 Encounter for supervision of other normal pregnancy, third trimester: Secondary | ICD-10-CM

## 2017-12-22 DIAGNOSIS — O0993 Supervision of high risk pregnancy, unspecified, third trimester: Secondary | ICD-10-CM

## 2017-12-22 DIAGNOSIS — Z1389 Encounter for screening for other disorder: Secondary | ICD-10-CM

## 2017-12-22 DIAGNOSIS — M545 Low back pain, unspecified: Secondary | ICD-10-CM

## 2017-12-22 DIAGNOSIS — O34219 Maternal care for unspecified type scar from previous cesarean delivery: Secondary | ICD-10-CM

## 2017-12-22 DIAGNOSIS — Z3A33 33 weeks gestation of pregnancy: Secondary | ICD-10-CM

## 2017-12-22 DIAGNOSIS — F419 Anxiety disorder, unspecified: Secondary | ICD-10-CM

## 2017-12-22 DIAGNOSIS — E038 Other specified hypothyroidism: Secondary | ICD-10-CM

## 2017-12-22 DIAGNOSIS — O99343 Other mental disorders complicating pregnancy, third trimester: Secondary | ICD-10-CM

## 2017-12-22 LAB — COMPREHENSIVE METABOLIC PANEL WITH GFR
ALT: 9 [IU]/L (ref 0–32)
AST: 13 [IU]/L (ref 0–40)
Albumin/Globulin Ratio: 1.2 (ref 1.2–2.2)
Albumin: 3.2 g/dL — ABNORMAL LOW (ref 3.5–5.5)
Alkaline Phosphatase: 246 [IU]/L — ABNORMAL HIGH (ref 39–117)
BUN/Creatinine Ratio: 9 (ref 9–23)
BUN: 4 mg/dL — ABNORMAL LOW (ref 6–20)
Bilirubin Total: 0.4 mg/dL (ref 0.0–1.2)
CO2: 19 mmol/L — ABNORMAL LOW (ref 20–29)
Calcium: 8.4 mg/dL — ABNORMAL LOW (ref 8.7–10.2)
Chloride: 105 mmol/L (ref 96–106)
Creatinine, Ser: 0.47 mg/dL — ABNORMAL LOW (ref 0.57–1.00)
GFR calc Af Amer: 154 mL/min/{1.73_m2}
GFR calc non Af Amer: 134 mL/min/{1.73_m2}
Globulin, Total: 2.6 g/dL (ref 1.5–4.5)
Glucose: 80 mg/dL (ref 65–99)
Potassium: 4.1 mmol/L (ref 3.5–5.2)
Sodium: 139 mmol/L (ref 134–144)
Total Protein: 5.8 g/dL — ABNORMAL LOW (ref 6.0–8.5)

## 2017-12-22 LAB — POCT URINALYSIS DIPSTICK OB
GLUCOSE, UA: NEGATIVE
NITRITE UA: NEGATIVE
PROTEIN: NEGATIVE

## 2017-12-22 LAB — BILE ACIDS, TOTAL: BILE ACIDS TOTAL: 2.9 umol/L (ref 0.0–10.0)

## 2017-12-22 MED ORDER — PREDNISONE 20 MG PO TABS: 40.0000 mg | ORAL_TABLET | Freq: Every day | ORAL | 0 refills | Status: AC

## 2017-12-22 MED ORDER — CYCLOBENZAPRINE HCL 10 MG PO TABS: 10.0000 mg | ORAL_TABLET | Freq: Three times a day (TID) | ORAL | 2 refills | Status: DC | PRN

## 2017-12-22 MED ORDER — TRAMADOL HCL 50 MG PO TABS: 50.0000 mg | ORAL_TABLET | Freq: Four times a day (QID) | ORAL | 0 refills | Status: DC | PRN

## 2017-12-22 NOTE — Progress Notes (Signed)
Pregnancy problem visit  Patient name: [name] MRN [id number]  Date of birth: 07-17-1985 Chief Complaint:   Routine Prenatal Visit  History of Present Illness:   [name] is a 32 y.o. G65P2003 female at [redacted]w[redacted]d  with an Estimated Date of Delivery: 02/08/18 being seen today for ongoing management of a high-risk pregnancy.   Today she reports: She was coughing and felt "a pop" and then had severe pain in low back. Worse with changes in position, worse with walking. Better with sitting still. Has been taking Tylenol 1g every 4 hours. She reports no loss of bowel or bladder function. Denies tingling in legs or shooting pain down leg. Reports low abdominal cramping but no bleeding, loss of fluid or contractions.  She is tearful and clearly uncomfortable. She broke into sobbing because she is "worried that she won't know when she is in labor" and just "worried all the time"   She reports a history of post-partum depression in the past. Reported history of hallucinations and thinks she was on a medication but does not remember the name.   Contractions: Irritability. Vag. Bleeding: None.  Movement: Present. denies leaking of fluid. Review of Systems:   Pertinent items are noted in HPI Denies abnormal vaginal discharge w/ itching/odor/irritation, headaches, visual changes, shortness of breath, chest pain, abdominal pain, severe nausea/vomiting, or problems with urination or bowel movements unless otherwise stated above. Pertinent History Reviewed:  Reviewed past medical,surgical, social, obstetrical and family history.  Reviewed problem list, medications and allergies. Physical Assessment:   Vitals:   12/22/17 1413  BP: 103/65  Pulse: 90  Weight: 296 lb 9.6 oz (134.5 kg)  Body mass index is 52.54 kg/m.        Physical Examination:   General appearance: Well appearing, and in no distress  Mental status: Alert, oriented to person, place, and time  Skin: Warm &  dry  Cardiovascular: Normal heart rate noted  Respiratory: Normal respiratory effort, no distress  Abdomen: Soft, gravid, nontender  Pelvic: Cervical exam deferred         Extremities: Edema: Trace  Fetal Status:     from U Movement: Present    No results found for this or any previous visit (from the past 24 hour(s)).  Assessment & Plan:  1) High-risk pregnancy G3P2003 at [redacted]w[redacted]d  with an Estimated Date of Delivery: 02/08/18   2) Anxiety/Depression- hard to tease out mood vs pain given back sx. Patient open to discuss possible anxiety component and has history of postpartum depression after twin delivery.   3)Generalized pruritus- still present Bile Acids pending  4) Acute back pain- possible bulging disc. No red flag sx and no radicular complaints today. Will treat with steroids, mild opiate  and muscle relaxant.  Return to clinic thurs to check in about sx  Meds:  Meds ordered this encounter  Medications  . cyclobenzaprine (FLEXERIL) 10 MG tablet    Sig: Take 1 tablet (10 mg total) by mouth 3 (three) times daily as needed for muscle spasms.    Dispense:  30 tablet    Refill:  2  . traMADol (ULTRAM) 50 MG tablet    Sig: Take 1 tablet (50 mg total) by mouth every 6 (six) hours as needed for severe pain.    Dispense:  15 tablet    Refill:  0  . predniSONE (DELTASONE) 20 MG tablet    Sig: Take 2 tablets (40 mg total) by mouth daily with breakfast for 5 days.  Dispense:  10 tablet    Refill:  0   Labs/procedures today: none  Plan:  Continue routine obstetrical care   Reviewed: Preterm labor symptoms and general obstetric precautions including but not limited to vaginal bleeding, contractions, leaking of fluid and fetal movement were reviewed in detail with the patient.  All questions were answered  Follow-up: Return in about 3 days (around 12/25/2017) for follow up back pain/mood.  Orders Placed This Encounter  Procedures  . POC Urinalysis Dipstick OB   Isa Rankin  [facility]  12/23/2017 4:49 PM

## 2017-12-22 NOTE — Progress Notes (Signed)
Pt reports that she coughed yesterday and felt a pop in her stomach. It hurt so much that it brought her to tears. She has been cramping since then.

## 2017-12-25 ENCOUNTER — Encounter: Payer: Commercial Managed Care - PPO | Admitting: Family Medicine

## 2018-01-02 ENCOUNTER — Encounter: Payer: Self-pay | Admitting: Obstetrics & Gynecology

## 2018-01-02 ENCOUNTER — Ambulatory Visit (INDEPENDENT_AMBULATORY_CARE_PROVIDER_SITE_OTHER): Payer: Commercial Managed Care - PPO | Admitting: Obstetrics & Gynecology

## 2018-01-02 VITALS — BP 104/71 | HR 85 | Wt 294.5 lb

## 2018-01-02 DIAGNOSIS — Z3A34 34 weeks gestation of pregnancy: Secondary | ICD-10-CM

## 2018-01-02 DIAGNOSIS — Z3483 Encounter for supervision of other normal pregnancy, third trimester: Secondary | ICD-10-CM

## 2018-01-02 DIAGNOSIS — Z331 Pregnant state, incidental: Secondary | ICD-10-CM

## 2018-01-02 DIAGNOSIS — Z1389 Encounter for screening for other disorder: Secondary | ICD-10-CM

## 2018-01-02 DIAGNOSIS — O34219 Maternal care for unspecified type scar from previous cesarean delivery: Secondary | ICD-10-CM

## 2018-01-02 LAB — POCT URINALYSIS DIPSTICK OB
Glucose, UA: NEGATIVE
Ketones, UA: NEGATIVE
NITRITE UA: NEGATIVE
PROTEIN: NEGATIVE

## 2018-01-02 NOTE — Progress Notes (Signed)
Clear vaginal discharge with no odor.  

## 2018-01-02 NOTE — Progress Notes (Signed)
Patient ID: [name], female   DOB: [birth year], 32 y.o.   MRN: [id number]   LOW-RISK PREGNANCY VISIT Patient name: [name] MRN [id number]  Date of birth: [birth year] Chief Complaint:   Routine Prenatal Visit (pelvic pain; vaginal discharge)  History of Present Illness:   [name] is a 32 y.o. 643P2003 female at 2152w5d with an Estimated Date of Delivery: 02/08/18 being seen today for ongoing management of a low-risk pregnancy.  Today she reports no complaints.  . Vag. Bleeding: None.  Movement: Present. denies leaking of fluid. Review of Systems:   Pertinent items are noted in HPI Denies abnormal vaginal discharge w/ itching/odor/irritation, headaches, visual changes, shortness of breath, chest pain, abdominal pain, severe nausea/vomiting, or problems with urination or bowel movements unless otherwise stated above. Pertinent History Reviewed:  Reviewed past medical,surgical, social, obstetrical and family history.  Reviewed problem list, medications and allergies. Physical Assessment:   Vitals:   01/02/18 1108  BP: 104/71  Pulse: 85  Weight: 294 lb 8 oz (133.6 kg)  Body mass index is 52.17 kg/m.        Physical Examination:   General appearance: Well appearing, and in no distress  Mental status: Alert, oriented to person, place, and time  Skin: Warm & dry  Cardiovascular: Normal heart rate noted  Respiratory: Normal respiratory effort, no distress  Abdomen: Soft, gravid, nontender  Pelvic: Cervical exam deferred       SSE cervical mucosu  Extremities: Edema: Trace  Fetal Status: Fetal Heart Rate (bpm): 170 Fundal Height: 30 cm Movement: Present    Results for orders placed or performed in visit on 01/02/18 (from the past 24 hour(s))  POC Urinalysis Dipstick OB   Collection Time: 01/02/18 11:10 AM  Result Value Ref Range   Color, UA     Clarity, UA     Glucose, UA Negative Negative   Bilirubin, UA     Ketones, UA neg    Spec Grav, UA     Blood, UA trace     pH, UA     POC,PROTEIN,UA Negative Negative, Trace, Small (1+), Moderate (2+), Large (3+), 4+   Urobilinogen, UA     Nitrite, UA neg    Leukocytes, UA Large (3+) (A) Negative   Appearance     Odor      Assessment & Plan:  1) Low-risk pregnancy G3P2003 at 6352w5d with an Estimated Date of Delivery: 02/08/18   2) Previous C sectio for VBAC   Meds: No orders of the defined types were placed in this encounter.  Labs/procedures today:   Plan:  Continue routine obstetrical care cervicl mucous discharge  Reviewed: Preterm labor symptoms and general obstetric precautions including but not limited to vaginal bleeding, contractions, leaking of fluid and fetal movement were reviewed in detail with the patient.  All questions were answered  Follow-up: Return in about 2 weeks (around 01/16/2018) for LROB.  Orders Placed This Encounter  Procedures  . POC Urinalysis Dipstick OB   Lazaro ArmsLuther H Phillips [place]  01/02/2018 11:42 AM

## 2018-01-13 ENCOUNTER — Ambulatory Visit: Payer: Commercial Managed Care - PPO | Admitting: "Endocrinology

## 2018-01-16 ENCOUNTER — Encounter: Payer: Self-pay | Admitting: *Deleted

## 2018-01-16 ENCOUNTER — Encounter: Payer: Self-pay | Admitting: Women's Health

## 2018-01-16 ENCOUNTER — Other Ambulatory Visit: Payer: Self-pay | Admitting: Advanced Practice Midwife

## 2018-01-16 ENCOUNTER — Other Ambulatory Visit: Payer: Self-pay

## 2018-01-16 ENCOUNTER — Ambulatory Visit (INDEPENDENT_AMBULATORY_CARE_PROVIDER_SITE_OTHER): Payer: Commercial Managed Care - PPO | Admitting: Women's Health

## 2018-01-16 ENCOUNTER — Telehealth: Payer: Self-pay | Admitting: Women's Health

## 2018-01-16 ENCOUNTER — Telehealth (HOSPITAL_COMMUNITY): Payer: Self-pay | Admitting: *Deleted

## 2018-01-16 VITALS — BP 120/72 | HR 97 | Wt 296.0 lb

## 2018-01-16 DIAGNOSIS — Z3A36 36 weeks gestation of pregnancy: Secondary | ICD-10-CM

## 2018-01-16 DIAGNOSIS — Z3483 Encounter for supervision of other normal pregnancy, third trimester: Secondary | ICD-10-CM

## 2018-01-16 DIAGNOSIS — O26843 Uterine size-date discrepancy, third trimester: Secondary | ICD-10-CM

## 2018-01-16 DIAGNOSIS — Z331 Pregnant state, incidental: Secondary | ICD-10-CM

## 2018-01-16 DIAGNOSIS — Z1389 Encounter for screening for other disorder: Secondary | ICD-10-CM

## 2018-01-16 LAB — POCT URINALYSIS DIPSTICK OB
Blood, UA: NEGATIVE
GLUCOSE, UA: NEGATIVE
Ketones, UA: NEGATIVE
LEUKOCYTES UA: NEGATIVE
Nitrite, UA: NEGATIVE
POC,PROTEIN,UA: NEGATIVE

## 2018-01-16 MED ORDER — AZITHROMYCIN 250 MG PO TABS: ORAL_TABLET | ORAL | 0 refills | Status: DC

## 2018-01-16 MED ORDER — PANTOPRAZOLE SODIUM 20 MG PO TBEC: 20.0000 mg | DELAYED_RELEASE_TABLET | Freq: Every day | ORAL | 6 refills | Status: DC

## 2018-01-16 NOTE — Progress Notes (Signed)
LOW-RISK PREGNANCY VISIT Patient name: [name] MRN [id number]  Date of birth: 17-Jun-1985 Chief Complaint:   Routine Prenatal Visit (GBS, CHL/GC, cough/cold)  History of Present Illness:   [name] is a 32 y.o. G31P2003 female at [redacted]w[redacted]d with an Estimated Date of Delivery: 02/08/18 being seen today for ongoing management of a low-risk pregnancy.  Today she reports cold x 2wks, nonproductive cough, fatigue, some congestion. No other s/s. Robitussin not working. Lots of reflux, eating TUMS. Contractions: Irregular. Vag. Bleeding: None.  Movement: Present. denies leaking of fluid. Review of Systems:   Pertinent items are noted in HPI Denies abnormal vaginal discharge w/ itching/odor/irritation, headaches, visual changes, shortness of breath, chest pain, abdominal pain, severe nausea/vomiting, or problems with urination or bowel movements unless otherwise stated above. Pertinent History Reviewed:  Reviewed past medical,surgical, social, obstetrical and family history.  Reviewed problem list, medications and allergies. Physical Assessment:   Vitals:   01/16/18 0916  BP: 120/72  Pulse: 97  Weight: 296 lb (134.3 kg)  Body mass index is 52.43 kg/m.        Physical Examination:   General appearance: Well appearing, and in no distress  Mental status: Alert, oriented to person, place, and time  Skin: Warm & dry  Cardiovascular: Normal heart rate noted, HRRR  Respiratory: Normal respiratory effort, no distress, LCTAB  Abdomen: Soft, gravid, nontender  Pelvic: Cervical exam performed  Dilation: Closed Effacement (%): Thick Station: Ballotable  Extremities: Edema: Trace  Fetal Status: Fetal Heart Rate (bpm): 153 Fundal Height: 32 cm from U Movement: Present Presentation: Complete Breech verified by informal u/s  Results for orders placed or performed in visit on 01/16/18 (from the past 24 hour(s))  POC Urinalysis Dipstick OB   Collection Time: 01/16/18  9:32 AM  Result Value  Ref Range   Color, UA     Clarity, UA     Glucose, UA Negative Negative   Bilirubin, UA     Ketones, UA neg    Spec Grav, UA     Blood, UA neg    pH, UA     POC,PROTEIN,UA Negative Negative, Trace, Small (1+), Moderate (2+), Large (3+), 4+   Urobilinogen, UA     Nitrite, UA neg    Leukocytes, UA Negative Negative   Appearance     Odor      Assessment & Plan:  1) Low-risk pregnancy G3P2003 at [redacted]w[redacted]d with an Estimated Date of Delivery: 02/08/18   2) Prev c/s, then successful VBAC, wants TOLAC  3) Respiratory infection> rx zpak  4) Reflux> rx protonix  5) Breech presentation> ECV 12/8 @ 0700, npo after MN, form faxed and orders placed  Meds:  Meds ordered this encounter  Medications  . azithromycin (ZITHROMAX Z-PAK) 250 MG tablet    Sig: Use as directed    Dispense:  6 each    Refill:  0    Order Specific Question:   Supervising Provider    Answer:   Despina Hidden, LUTHER H [[id number]]  . pantoprazole (PROTONIX) 20 MG tablet    Sig: Take 1 tablet (20 mg total) by mouth daily.    Dispense:  30 tablet    Refill:  6    Order Specific Question:   Supervising Provider    Answer:   Lazaro Arms [[id number]]   Labs/procedures today: gbs, gc/ct, sve, informal u/s  Plan:  Continue routine obstetrical care   Reviewed: Preterm labor symptoms and general obstetric precautions including but not limited to  vaginal bleeding, contractions, leaking of fluid and fetal movement were reviewed in detail with the patient.  All questions were answered  Follow-up: Return in about 1 week (around 01/23/2018) for LROB, US:EFW.  Orders Placed This Encounter  Procedures  . GC/Chlamydia Probe Amp  . Culture, beta strep (group b only)  . US OB Follow Up  . POC Urinalysis Dipstick OB   Cheral MarkerKimberly R Shannan Slinker CNM, [facility]NP-BC 01/16/2018 9:59 AM

## 2018-01-16 NOTE — Patient Instructions (Addendum)
[name], I greatly value your feedback.  If you receive a survey following your visit with [place] today, we appreciate you taking the time to fill it out.  Thanks, [name], CNM, WHNP-BC  Be at [facility] hospital Sunday 12/8 at 7am, nothing to eat or drink after midnight the night before   Call the office [phone]) or go to [facility] if:  You begin to have strong, frequent contractions  Your water breaks.  Sometimes it is a big gush of fluid, sometimes it is just a trickle that keeps getting your panties wet or running down your legs  You have vaginal bleeding.  It is normal to have a small amount of spotting if your cervix was checked.   You don't feel your baby moving like normal.  If you don't, get you something to eat and drink and lay down and focus on feeling your baby move.  You should feel at least 10 movements in 2 hours.  If you don't, you should call the office or go to [facility].    Humidifier and saline nasal spray for nasal congestion  Regular robitussin, cough drops for cough  Warm salt water gargles for sore throat  Mucinex with lots of water to help you cough up the mucous in your chest if needed  Drink plenty of fluids and stay hydrated!  Wash your hands frequently.[name] Contractions Contractions of the uterus can occur throughout pregnancy, but they are not always a sign that you are in labor. You may have practice contractions called Braxton Hicks contractions. These false labor contractions are sometimes confused with true labor. What are [name] contractions? Braxton Hicks contractions are tightening movements that occur in the muscles of the uterus before labor. Unlike true labor contractions, these contractions do not result in opening (dilation) and thinning of the cervix. Toward the end of pregnancy (32-34 weeks), Braxton Hicks contractions can happen more often and may become stronger. These contractions are  sometimes difficult to tell apart from true labor because they can be very uncomfortable. You should not feel embarrassed if you go to the hospital with false labor. Sometimes, the only way to tell if you are in true labor is for your health care provider to look for changes in the cervix. The health care provider will do a physical exam and may monitor your contractions. If you are not in true labor, the exam should show that your cervix is not dilating and your water has not broken. If there are other health problems associated with your pregnancy, it is completely safe for you to be sent home with false labor. You may continue to have Braxton Hicks contractions until you go into true labor. How to tell the difference between true labor and false labor True labor  Contractions last 30-70 seconds.  Contractions become very regular.  Discomfort is usually felt in the top of the uterus, and it spreads to the lower abdomen and low back.  Contractions do not go away with walking.  Contractions usually become more intense and increase in frequency.  The cervix dilates and gets thinner. False labor  Contractions are usually shorter and not as strong as true labor contractions.  Contractions are usually irregular.  Contractions are often felt in the front of the lower abdomen and in the groin.  Contractions may go away when you walk around or change positions while lying down.  Contractions get weaker and are  shorter-lasting as time goes on.  The cervix usually does not dilate or become thin. Follow these instructions at home:  Take over-the-counter and prescription medicines only as told by your health care provider.  Keep up with your usual exercises and follow other instructions from your health care provider.  Eat and drink lightly if you think you are going into labor.  If Braxton Hicks contractions are making you uncomfortable: ? Change your position from lying down or resting  to walking, or change from walking to resting. ? Sit and rest in a tub of warm water. ? Drink enough fluid to keep your urine pale yellow. Dehydration may cause these contractions. ? Do slow and deep breathing several times an hour.  Keep all follow-up prenatal visits as told by your health care provider. This is important. Contact a health care provider if:  You have a fever.  You have continuous pain in your abdomen. Get help right away if:  Your contractions become stronger, more regular, and closer together.  You have fluid leaking or gushing from your vagina.  You pass blood-tinged mucus (bloody show).  You have bleeding from your vagina.  You have low back pain that you never had before.  You feel your baby's head pushing down and causing pelvic pressure.  Your baby is not moving inside you as much as it used to. Summary  Contractions that occur before labor are called Braxton Hicks contractions, false labor, or practice contractions.  Braxton Hicks contractions are usually shorter, weaker, farther apart, and less regular than true labor contractions. True labor contractions usually become progressively stronger and regular and they become more frequent.  Manage discomfort from Columbia CenterBraxton Hicks contractions by changing position, resting in a warm bath, drinking plenty of water, or practicing deep breathing. This information is not intended to replace advice given to you by your health care provider. Make sure you discuss any questions you have with your health care provider. Document Released: 06/13/2016 Document Revised: 06/13/2016 Document Reviewed: 06/13/2016 Elsevier Interactive Patient Education  2018 [employer]

## 2018-01-16 NOTE — Telephone Encounter (Signed)
Patient called, stated she was just seen and was prescribed Protonix, but she stated that the pharmacy is stating that the insurance is requiring a prior autho.  [facility] [place]  [phone]

## 2018-01-16 NOTE — Treatment Plan (Signed)
ECV Scheduling Form Fax to [facility]:  [phone]  Neita GoodnightKatelyn C Mand                                                                                   DOB:  December 20, 1985                                                            MRN:  [id number]                                                                     Phone #:   [phone]                         Provider:  [facility]  GP:  [id number]                                                            Estimated Date of Delivery: 02/08/18  Dating Criteria: 6wk u/s    Medical Indications for ECV:  breech Admission Date/Time:  12/8 @0700  Gestational age on admission:  37.0   Filed Weights   01/16/18 0916  Weight: 296 lb (134.3 kg)   HIV:  Non Reactive (10/11 0845) GBS:  pending  Scheduling Provider Signature:  Cheral MarkerKimberly R Booker, CNM                                            Today's Date:  01/16/2018

## 2018-01-16 NOTE — Telephone Encounter (Signed)
Preadmission screen  

## 2018-01-16 NOTE — Telephone Encounter (Signed)
Phone busy. Will send mychart message.  

## 2018-01-18 ENCOUNTER — Encounter (HOSPITAL_COMMUNITY): Payer: Self-pay

## 2018-01-18 ENCOUNTER — Observation Stay (HOSPITAL_COMMUNITY)
Admission: RE | Admit: 2018-01-18 | Discharge: 2018-01-18 | Disposition: A | Discharge status: Home / Self Care | Payer: Commercial Managed Care - PPO | Source: Ambulatory Visit | Attending: Family Medicine | Admitting: Family Medicine

## 2018-01-18 DIAGNOSIS — Z3A37 37 weeks gestation of pregnancy: Secondary | ICD-10-CM | POA: Insufficient documentation

## 2018-01-18 DIAGNOSIS — M6208 Separation of muscle (nontraumatic), other site: Secondary | ICD-10-CM

## 2018-01-18 DIAGNOSIS — O321XX0 Maternal care for breech presentation, not applicable or unspecified: Secondary | ICD-10-CM | POA: Diagnosis not present

## 2018-01-18 DIAGNOSIS — Z3483 Encounter for supervision of other normal pregnancy, third trimester: Secondary | ICD-10-CM

## 2018-01-18 LAB — CBC WITH DIFFERENTIAL/PLATELET
Basophils Absolute: 0 10*3/uL (ref 0.0–0.1)
Basophils Relative: 0 %
Eosinophils Absolute: 0.1 10*3/uL (ref 0.0–0.5)
Eosinophils Relative: 1 %
HEMATOCRIT: 37.2 % (ref 36.0–46.0)
Hemoglobin: 11.8 g/dL — ABNORMAL LOW (ref 12.0–15.0)
Lymphocytes Relative: 20 %
Lymphs Abs: 1.9 10*3/uL (ref 0.7–4.0)
MCH: 28.6 pg (ref 26.0–34.0)
MCHC: 31.7 g/dL (ref 30.0–36.0)
MCV: 90.1 fL (ref 80.0–100.0)
Monocytes Absolute: 0.4 10*3/uL (ref 0.1–1.0)
Monocytes Relative: 4 %
Neutro Abs: 7.2 10*3/uL (ref 1.7–7.7)
Neutrophils Relative %: 75 %
Platelets: 281 10*3/uL (ref 150–400)
RBC: 4.13 MIL/uL (ref 3.87–5.11)
RDW: 14.7 % (ref 11.5–15.5)
WBC: 9.6 10*3/uL (ref 4.0–10.5)
nRBC: 0 % (ref 0.0–0.2)

## 2018-01-18 LAB — TYPE AND SCREEN
ABO/RH(D): O POS
Antibody Screen: NEGATIVE

## 2018-01-18 MED ORDER — SOD CITRATE-CITRIC ACID 500-334 MG/5ML PO SOLN
ORAL | Status: AC
  Filled 2018-01-18: qty 15

## 2018-01-18 MED ORDER — TERBUTALINE SULFATE 1 MG/ML IJ SOLN
0.2500 mg | Freq: Once | INTRAMUSCULAR | Status: AC
  Administered 2018-01-18: 0.25 mg via SUBCUTANEOUS
  Filled 2018-01-18: qty 1

## 2018-01-18 NOTE — Progress Notes (Signed)
9119w0d G3P2003 frank breech presentation Posterior placenta Excellent fluid Back to the left Vertex is midline, face toward back  Reactive NST with some contractions Given 0.25 SQ terbutaline with good resulting uterine relaxation  Attempted ECV with forward roll  Was able to turn baby straight transverse but not beyond Mother tolerating pretty well, normal response Baby was reactive immediately afterwards and continued to be for some time afterwards  Discharge for follow up as scheduled in office  Unless there is spontaneous version, will need scheduled c section at 39 weeks or later  Lazaro ArmsLuther H Eure, MD  01/18/2018 1:31 PM

## 2018-01-18 NOTE — Discharge Instructions (Signed)
Breech Birth What is a breech birth? A breech birth is when a baby is born with the buttocks or the feet first. Most babies are in a head down (vertex) position when they are born. There are three types of breech babies:  When the baby's buttocks are showing first in the birth canal (vagina) with the legs straight up and the feet at the baby's head (frank breech).  When the baby's buttocks shows first with the legs bent at the knees and the feet down near the buttocks (complete breech).  When one or both of the baby's feet are down below the buttocks (footling breech).  What are the risks of a breech birth? Having a breech birth increases the risk to your baby. A breech birth may cause the following:  Umbilical cord prolapse. This is when the umbilical cord is in front of the baby before or during labor. This can cause the cord to become pinched or compressed. This can reduce the flow of blood and oxygen to the baby.  The baby getting stuck in the birth canal, which can cause injury or, rarely, death.  Injury to the nerves in the shoulder, arm, and hand (brachial plexus injury) when delivered.  Your baby being born too early (prematurely).  An increased need for a cesarean delivery.  What increases the risk of having a breech baby? It is not known what causes your baby to be breech. However, risk factors that may increase your chances of having a breech baby include the following:  The mother having had several babies already.  The mother having twins or more.  The mother having a baby with certain congenital disabilities.  The mother going into labor early.  The mother having problems with her uterus, such as a tumor.  The mother having placenta problems (placenta previa) or too much or not enough fluid surrounding the baby (amniotic fluid).  How do I know if my baby is breech? There are no symptoms for you to know that your baby is breech. When you are close to your due date,  your health care provider can tell if your baby is breech by:  An abdominal or vaginal (pelvic) exam.  An ultrasound.  Your health care provider may also be able to tell that your baby is breech if your baby's heartbeat is heard above your belly button. What can be done if my baby is breech?  Your health care provider may try to turn the baby in your uterus. This is a procedure called external cephalic version (ECV). This is done by your health care provider. He or she will place both hands on your abdomen and gently and slowly turn the baby around. It is important to know that ECV can increase your chances of suddenly going into labor. If an ECV is done, it is done toward the end of a healthy pregnancy. The baby may remain in this position or he or she may turn back to the breech position. You and your health care provider will discuss if an ECV is recommended for you and your baby. How will I delivery my baby if my baby is breech? You and your health care provider will discuss the best way to deliver your baby. If your baby is breech, it is less likely that a vaginal delivery will be recommended due to the risks. Some breech babies may be delivered safely without a cesarean, while in other cases health care providers will recommend a cesarean delivery. This   information is not intended to replace advice given to you by your health care provider. Make sure you discuss any questions you have with your health care provider. Document Released: 03/21/2006 Document Revised: 01/15/2016 Document Reviewed: 12/02/2013 Elsevier Interactive Patient Education  2017 Elsevier Inc.  

## 2018-01-18 NOTE — Discharge Summary (Signed)
Physician Discharge Summary  Patient ID: Lisa GoodnightKatelyn C Wiley MRN: [id number] DOB/AGE: November 26, 1985 32 y.o.  Admit date: 01/18/2018 Discharge date: 01/18/2018  Admission Diagnoses: breech Discharge Diagnoses:  Active Problems:   Breech presentation unsuccessful version  Discharged Condition: good  Hospital Course: attempted but unsuccessfual ECV  Consults: None  Significant Diagnostic Studies:   Treatments: ECV  Discharge Exam: Blood pressure 119/71, pulse 96, temperature 97.7 F (36.5 C), temperature source Oral, height 5\' 2"  (1.575 m), weight 133.9 kg, last menstrual period 04/27/2017.   Disposition: Discharge disposition: 01-Home or Self Care       Discharge Instructions    Call MD for:  persistant nausea and vomiting   Complete by:  As directed    Call MD for:  severe uncontrolled pain   Complete by:  As directed    Call MD for:  temperature >100.4   Complete by:  As directed    Diet - low sodium heart healthy   Complete by:  As directed    Increase activity slowly   Complete by:  As directed      Allergies as of 01/18/2018      Reactions   Imitrex [sumatriptan]    Makes migraines worse.    Maxalt [rizatriptan]    Makes migraines worse   Phenergan [promethazine Hcl] Nausea And Vomiting   Tape Other (See Comments)   Blisters on abdomen after C Section      Medication List    TAKE these medications   acetaminophen 325 MG tablet Commonly known as:  TYLENOL Take 650 mg by mouth every 6 (six) hours as needed.   azithromycin 250 MG tablet Commonly known as:  ZITHROMAX Use as directed   cyclobenzaprine 10 MG tablet Commonly known as:  FLEXERIL Take 1 tablet (10 mg total) by mouth 3 (three) times daily as needed for muscle spasms.   guaifenesin 100 MG/5ML syrup Commonly known as:  ROBITUSSIN Take 200 mg by mouth 3 (three) times daily as needed for cough.   levothyroxine 150 MCG tablet Commonly known as:  SYNTHROID, LEVOTHROID Take 1 tablet (150 mcg  total) by mouth daily before breakfast.   Lysine 1000 MG Tabs Take by mouth as needed.   OB COMPLETE PETITE 35-5-1-200 MG Caps Take 1 capsule by mouth daily.   pantoprazole 20 MG tablet Commonly known as:  PROTONIX Take 1 tablet (20 mg total) by mouth daily.   traMADol 50 MG tablet Commonly known as:  ULTRAM Take 1 tablet (50 mg total) by mouth every 6 (six) hours as needed for severe pain.      Follow-up Information    [facility] Follow up.   Why:  keep scheduled appt Contact information: [address] [place] [phone]          Signed: Lazaro ArmsLuther H Angelynn Lemus 01/18/2018, 1:24 PM

## 2018-01-18 NOTE — Progress Notes (Signed)
Patient discharged per MD orders.  IV removed, and FHR monitors removed. Discharge instructions explained and patient verbally agreed.

## 2018-01-19 ENCOUNTER — Inpatient Hospital Stay (HOSPITAL_COMMUNITY): Payer: Commercial Managed Care - PPO

## 2018-01-20 ENCOUNTER — Telehealth: Payer: Self-pay | Admitting: *Deleted

## 2018-01-20 ENCOUNTER — Other Ambulatory Visit: Payer: Self-pay

## 2018-01-20 ENCOUNTER — Encounter (HOSPITAL_COMMUNITY): Payer: Self-pay

## 2018-01-20 ENCOUNTER — Inpatient Hospital Stay (HOSPITAL_COMMUNITY)
Admission: AD | Admit: 2018-01-20 | Discharge: 2018-01-20 | Disposition: A | Discharge status: Home / Self Care | Payer: Commercial Managed Care - PPO | Attending: Obstetrics & Gynecology | Admitting: Obstetrics & Gynecology

## 2018-01-20 DIAGNOSIS — O26893 Other specified pregnancy related conditions, third trimester: Secondary | ICD-10-CM | POA: Diagnosis not present

## 2018-01-20 DIAGNOSIS — Y92008 Other place in unspecified non-institutional (private) residence as the place of occurrence of the external cause: Secondary | ICD-10-CM | POA: Insufficient documentation

## 2018-01-20 DIAGNOSIS — Z3A37 37 weeks gestation of pregnancy: Secondary | ICD-10-CM

## 2018-01-20 DIAGNOSIS — W010XXA Fall on same level from slipping, tripping and stumbling without subsequent striking against object, initial encounter: Secondary | ICD-10-CM | POA: Diagnosis not present

## 2018-01-20 DIAGNOSIS — Z87891 Personal history of nicotine dependence: Secondary | ICD-10-CM | POA: Insufficient documentation

## 2018-01-20 DIAGNOSIS — R102 Pelvic and perineal pain: Secondary | ICD-10-CM | POA: Diagnosis present

## 2018-01-20 DIAGNOSIS — W19XXXA Unspecified fall, initial encounter: Secondary | ICD-10-CM | POA: Diagnosis not present

## 2018-01-20 LAB — URINALYSIS, ROUTINE W REFLEX MICROSCOPIC
Bilirubin Urine: NEGATIVE
Glucose, UA: NEGATIVE mg/dL
Hgb urine dipstick: NEGATIVE
Ketones, ur: 20 mg/dL — AB
Leukocytes, UA: NEGATIVE
Nitrite: NEGATIVE
Protein, ur: NEGATIVE mg/dL
Specific Gravity, Urine: 1.019 (ref 1.005–1.030)
pH: 7 (ref 5.0–8.0)

## 2018-01-20 LAB — CULTURE, BETA STREP (GROUP B ONLY): Strep Gp B Culture: NEGATIVE

## 2018-01-20 NOTE — Telephone Encounter (Signed)
Patient states she fell and did a "split" and felt something "pop".  Since then she has been having difficulty walking.  [facility]  Pt tearful but verbalized understanding.

## 2018-01-20 NOTE — MAU Provider Note (Signed)
History     CSN: [id number]  Arrival date and time: 01/20/18 1137   First Provider Initiated Contact with Patient 01/20/18 1249      Chief Complaint  Patient presents with  . Fall  . Pelvic Pain   HPI [name] is a 32 y.o. G3P2003 at [redacted]w[redacted]d who presents after a fall. She states at 0730 she slipped on her porch and did a split. She did not hit her abdomen. She denies any abdominal pain, vaginal bleeding or leaking of fluid. She reports pain in her groin. She reports normal fetal movement.   OB History    Gravida  3   Para  2   Term  2   Preterm      AB      Living  3     SAB      TAB      Ectopic      Multiple  1   Live Births  3           Past Medical History:  Diagnosis Date  . Breast nodule 10/10/2014  . Breast pain 10/10/2014  . BV (bacterial vaginosis) 11/12/2012  . Depression   . Fatty liver   . Hypothyroid 02/14/2015  . Irregular menses 05/21/2013  . Migraines   . Obesity   . Other and unspecified ovarian cyst 05/31/2013   Right complex ?cystic vs solid mass on ovary will schedule appt with [facility]  . Pregnant 02/14/2015  . Thyroid disease     Past Surgical History:  Procedure Laterality Date  . CESAREAN SECTION    . COLONOSCOPY    . OOPHORECTOMY    . TONSILLECTOMY    . WISDOM TOOTH EXTRACTION      Family History  Problem Relation Age of Onset  . Diabetes Maternal Grandmother   . Hypertension Maternal Grandmother   . Cancer Maternal Grandmother        breast  . Diabetes Maternal Grandfather   . Hypertension Maternal Grandfather   . Leukemia Maternal Grandfather   . Diabetes Mother   . Hypertension Mother   . Heart attack Mother   . Stroke Mother   . Kidney disease Paternal Grandfather   . Seizures Father   . Other Father        back problems  . Dementia Paternal Grandmother   . Cancer Maternal Aunt   . Diabetes Maternal Uncle     Social History   Tobacco Use  . Smoking status: Former Smoker    Packs/day: 0.25   Years: 8.00    Pack years: 2.00    Types: Cigarettes    Last attempt to quit: 10/07/2010    Years since quitting: 7.2  . Smokeless tobacco: Never Used  Substance Use Topics  . Alcohol use: No    Comment: occ.; not now  . Drug use: No    Allergies:  Allergies  Allergen Reactions  . Imitrex [Sumatriptan]     Makes migraines worse.   . Maxalt [Rizatriptan]     Makes migraines worse  . Phenergan [Promethazine Hcl] Nausea And Vomiting  . Tape Other (See Comments)    Blisters on abdomen after C Section    Medications Prior to Admission  Medication Sig Dispense Refill Last Dose  . acetaminophen (TYLENOL) 325 MG tablet Take 650 mg by mouth every 6 (six) hours as needed.   Past Week at Unknown time  . azithromycin (ZITHROMAX Z-PAK) 250 MG tablet Use as directed 6 each 0  01/18/2018 at Unknown time  . cyclobenzaprine (FLEXERIL) 10 MG tablet Take 1 tablet (10 mg total) by mouth 3 (three) times daily as needed for muscle spasms. 30 tablet 2 Past Week at Unknown time  . guaifenesin (ROBITUSSIN) 100 MG/5ML syrup Take 200 mg by mouth 3 (three) times daily as needed for cough.   01/18/2018 at Unknown time  . levothyroxine (SYNTHROID, LEVOTHROID) 150 MCG tablet Take 1 tablet (150 mcg total) by mouth daily before breakfast. 30 tablet 3 01/17/2018 at Unknown time  . Lysine 1000 MG TABS Take by mouth as needed.    Taking  . pantoprazole (PROTONIX) 20 MG tablet Take 1 tablet (20 mg total) by mouth daily. 30 tablet 6   . Prenat-FeCbn-FeAspGl-FA-Omega (OB COMPLETE PETITE) 35-5-1-200 MG CAPS Take 1 capsule by mouth daily. 30 capsule 12 01/17/2018 at Unknown time  . traMADol (ULTRAM) 50 MG tablet Take 1 tablet (50 mg total) by mouth every 6 (six) hours as needed for severe pain. 15 tablet 0 Past Week at Unknown time    Review of Systems  Constitutional: Negative.  Negative for fatigue and fever.  HENT: Negative.   Respiratory: Negative.  Negative for shortness of breath.   Cardiovascular: Negative.   Negative for chest pain.  Gastrointestinal: Negative.  Negative for abdominal pain, constipation, diarrhea, nausea and vomiting.  Genitourinary: Positive for pelvic pain. Negative for dysuria, vaginal bleeding and vaginal discharge.  Neurological: Negative.  Negative for dizziness and headaches.   Physical Exam   Blood pressure 114/71, pulse (!) 117, temperature 97.8 F (36.6 C), temperature source Oral, resp. rate 20, weight 134.7 kg, last menstrual period 04/27/2017.  Physical Exam  Nursing note and vitals reviewed. Constitutional: She is oriented to person, place, and time. She appears well-developed and well-nourished. No distress.  HENT:  Head: Normocephalic.  Eyes: Pupils are equal, round, and reactive to light.  Cardiovascular: Normal rate, regular rhythm and normal heart sounds.  Respiratory: Effort normal and breath sounds normal. No respiratory distress.  GI: Soft. Bowel sounds are normal. She exhibits no distension. There is no tenderness.  Neurological: She is alert and oriented to person, place, and time.  Skin: Skin is warm and dry.  Psychiatric: She has a normal mood and affect. Her behavior is normal. Judgment and thought content normal.   Fetal Tracing:  Baseline: 140 Variability: moderate Accels: 15x15 Decels: none  Toco: none   MAU Course  Procedures  MDM NST  Consulted with Dr. Macon LargeAnyanwu- fall was greater than 4 hours ago and patient not contracting, ok to discharge home with return precautions  Assessment and Plan   1. Fall, initial encounter   2. [redacted] weeks gestation of pregnancy    -Discharge home in stable condition -Abdominal pain and vaginal bleeding precautions discussed -[facility] -Patient may return to MAU as needed or if her condition were to change or worsen   [name] CNM 01/20/2018, 12:49 PM

## 2018-01-20 NOTE — MAU Note (Signed)
Pt states she fell, feet slipped, "I did a split," around 0730.  C/O pelvic pain & pressure, some in her back, hurts to walk.  Denies bleeding or LOF.  Reports good fetal movement.

## 2018-01-20 NOTE — Discharge Instructions (Signed)

## 2018-01-21 ENCOUNTER — Encounter: Payer: Self-pay | Admitting: Women's Health

## 2018-01-21 ENCOUNTER — Ambulatory Visit (INDEPENDENT_AMBULATORY_CARE_PROVIDER_SITE_OTHER): Payer: Commercial Managed Care - PPO

## 2018-01-21 ENCOUNTER — Ambulatory Visit (INDEPENDENT_AMBULATORY_CARE_PROVIDER_SITE_OTHER): Payer: Commercial Managed Care - PPO | Admitting: Women's Health

## 2018-01-21 VITALS — BP 122/83 | HR 96 | Wt 294.0 lb

## 2018-01-21 DIAGNOSIS — O26843 Uterine size-date discrepancy, third trimester: Secondary | ICD-10-CM | POA: Diagnosis not present

## 2018-01-21 DIAGNOSIS — Z331 Pregnant state, incidental: Secondary | ICD-10-CM

## 2018-01-21 DIAGNOSIS — Z3483 Encounter for supervision of other normal pregnancy, third trimester: Secondary | ICD-10-CM

## 2018-01-21 DIAGNOSIS — Z3A37 37 weeks gestation of pregnancy: Secondary | ICD-10-CM

## 2018-01-21 DIAGNOSIS — R3911 Hesitancy of micturition: Secondary | ICD-10-CM

## 2018-01-21 DIAGNOSIS — M6208 Separation of muscle (nontraumatic), other site: Secondary | ICD-10-CM

## 2018-01-21 DIAGNOSIS — Z1389 Encounter for screening for other disorder: Secondary | ICD-10-CM

## 2018-01-21 LAB — POCT URINALYSIS DIPSTICK OB
Blood, UA: NEGATIVE
GLUCOSE, UA: NEGATIVE
Ketones, UA: NEGATIVE
Leukocytes, UA: NEGATIVE
Nitrite, UA: NEGATIVE

## 2018-01-21 LAB — GC/CHLAMYDIA PROBE AMP
Chlamydia trachomatis, NAA: NEGATIVE
Neisseria gonorrhoeae by PCR: NEGATIVE

## 2018-01-21 NOTE — Progress Notes (Signed)
   LOW-RISK PREGNANCY VISIT Patient name: [name] MRN [id number]  Date of birth: 10/10/1985 Chief Complaint:   Routine Prenatal Visit  History of Present Illness:   [name] is a 32 y.o. 333P2003 female at 7492w3d with an Estimated Date of Delivery: 02/08/18 being seen today for ongoing management of a low-risk pregnancy.  Today she reports only small dribbles when voiding. Contractions: Not present. Vag. Bleeding: None.  Movement: Present. denies leaking of fluid. Review of Systems:   Pertinent items are noted in HPI Denies abnormal vaginal discharge w/ itching/odor/irritation, headaches, visual changes, shortness of breath, chest pain, abdominal pain, severe nausea/vomiting, or problems with urination or bowel movements unless otherwise stated above. Pertinent History Reviewed:  Reviewed past medical,surgical, social, obstetrical and family history.  Reviewed problem list, medications and allergies. Physical Assessment:   Vitals:   01/21/18 1507  BP: 122/83  Pulse: 96  Weight: 294 lb (133.4 kg)  Body mass index is 53.77 kg/m.        Physical Examination:   General appearance: Well appearing, and in no distress  Mental status: Alert, oriented to person, place, and time  Skin: Warm & dry  Cardiovascular: Normal heart rate noted  Respiratory: Normal respiratory effort, no distress  Abdomen: Soft, gravid, nontender  Pelvic: Cervical exam performed         Extremities: Edema: Trace  Fetal Status: Fetal Heart Rate (bpm): 152 u/s Fundal Height: 33 cm Movement: Present    US 37+3 wks,cephalic,fhr 152 bpm,bilat adnexa's wnl,right oophorectomy,posterior placenta gr 3,afi 22 cm,EFW 3606 g 88%,AC 95%  Results for orders placed or performed in visit on 01/21/18 (from the past 24 hour(s))  POC Urinalysis Dipstick OB   Collection Time: 01/21/18  3:14 PM  Result Value Ref Range   Color, UA     Clarity, UA     Glucose, UA Negative Negative   Bilirubin, UA     Ketones, UA neg     Spec Grav, UA     Blood, UA neg    pH, UA     POC,PROTEIN,UA Trace Negative, Trace, Small (1+), Moderate (2+), Large (3+), 4+   Urobilinogen, UA     Nitrite, UA neg    Leukocytes, UA Negative Negative   Appearance     Odor      Assessment & Plan:  1) Low-risk pregnancy G3P2003 at 7292w3d with an Estimated Date of Delivery: 02/08/18   2) Prev c/s then VBAC, wants TOLAC, baby VTX today!!! After failed ECV 12/8   Meds: No orders of the defined types were placed in this encounter.  Labs/procedures today: efw u/s  Plan:  Continue routine obstetrical care   Reviewed: Term labor symptoms and general obstetric precautions including but not limited to vaginal bleeding, contractions, leaking of fluid and fetal movement were reviewed in detail with the patient.  All questions were answered  Follow-up: Return in about 1 week (around 01/28/2018) for LROB.  Orders Placed This Encounter  Procedures  . Urine Culture  . POC Urinalysis Dipstick OB   Cheral MarkerKimberly R Rober Skeels CNM, [facility]NP-BC 01/21/2018 3:25 PM

## 2018-01-21 NOTE — Patient Instructions (Signed)
Lisa GoodnightKatelyn C Sparrow, I greatly value your feedback.  If you receive a survey following your visit with us today, we appreciate you taking the time to fill it out.  Thanks, Joellyn HaffKim Booker, CNM, WHNP-BC   Call the office [phone]) or go to [facility] if:  You begin to have strong, frequent contractions  Your water breaks.  Sometimes it is a big gush of fluid, sometimes it is just a trickle that keeps getting your panties wet or running down your legs  You have vaginal bleeding.  It is normal to have a small amount of spotting if your cervix was checked.   You don't feel your baby moving like normal.  If you don't, get you something to eat and drink and lay down and focus on feeling your baby move.  You should feel at least 10 movements in 2 hours.  If you don't, you should call the office or go to [facility].     [facility] Contractions of the uterus can occur throughout pregnancy, but they are not always a sign that you are in labor. You may have practice contractions called Braxton Hicks contractions. These false labor contractions are sometimes confused with true labor. What are Deberah PeltonBraxton Hicks contractions? Braxton Hicks contractions are tightening movements that occur in the muscles of the uterus before labor. Unlike true labor contractions, these contractions do not result in opening (dilation) and thinning of the cervix. Toward the end of pregnancy (32-34 weeks), Braxton Hicks contractions can happen more often and may become stronger. These contractions are sometimes difficult to tell apart from true labor because they can be very uncomfortable. You should not feel embarrassed if you go to the hospital with false labor. Sometimes, the only way to tell if you are in true labor is for your health care provider to look for changes in the cervix. The health care provider will do a physical exam and may monitor your contractions. If you are not in true labor, the exam should  show that your cervix is not dilating and your water has not broken. If there are other health problems associated with your pregnancy, it is completely safe for you to be sent home with false labor. You may continue to have Braxton Hicks contractions until you go into true labor. How to tell the difference between true labor and false labor True labor  Contractions last 30-70 seconds.  Contractions become very regular.  Discomfort is usually felt in the top of the uterus, and it spreads to the lower abdomen and low back.  Contractions do not go away with walking.  Contractions usually become more intense and increase in frequency.  The cervix dilates and gets thinner. False labor  Contractions are usually shorter and not as strong as true labor contractions.  Contractions are usually irregular.  Contractions are often felt in the front of the lower abdomen and in the groin.  Contractions may go away when you walk around or change positions while lying down.  Contractions get weaker and are shorter-lasting as time goes on.  The cervix usually does not dilate or become thin. Follow these instructions at home:  Take over-the-counter and prescription medicines only as told by your health care provider.  Keep up with your usual exercises and follow other instructions from your health care provider.  Eat and drink lightly if you think you are going into labor.  If Braxton Hicks contractions are making you uncomfortable: ? Change your position from lying  down or resting to walking, or change from walking to resting. ? Sit and rest in a tub of warm water. ? Drink enough fluid to keep your urine pale yellow. Dehydration may cause these contractions. ? Do slow and deep breathing several times an hour.  Keep all follow-up prenatal visits as told by your health care provider. This is important. Contact a health care provider if:  You have a fever.  You have continuous pain in  your abdomen. Get help right away if:  Your contractions become stronger, more regular, and closer together.  You have fluid leaking or gushing from your vagina.  You pass blood-tinged mucus (bloody show).  You have bleeding from your vagina.  You have low back pain that you never had before.  You feel your baby's head pushing down and causing pelvic pressure.  Your baby is not moving inside you as much as it used to. Summary  Contractions that occur before labor are called Braxton Hicks contractions, false labor, or practice contractions.  Braxton Hicks contractions are usually shorter, weaker, farther apart, and less regular than true labor contractions. True labor contractions usually become progressively stronger and regular and they become more frequent.  Manage discomfort from [facility] contractions by changing position, resting in a warm bath, drinking plenty of water, or practicing deep breathing. This information is not intended to replace advice given to you by your health care provider. Make sure you discuss any questions you have with your health care provider. Document Released: 06/13/2016 Document Revised: 06/13/2016 Document Reviewed: 06/13/2016 Elsevier Interactive Patient Education  2018 [employer].

## 2018-01-21 NOTE — Progress Notes (Addendum)
US 37+3 wks,cephalic,fhr 152 bpm,bilat adnexa's wnl,right oophorectomy,posterior placenta gr 3,afi 22 cm,EFW 3606 g 88%,AC 95%

## 2018-01-22 ENCOUNTER — Telehealth: Payer: Self-pay | Admitting: Obstetrics & Gynecology

## 2018-01-22 NOTE — Telephone Encounter (Signed)
Patient called, would like to speak to a nurse, she is having cramping pain.  [phone]

## 2018-01-22 NOTE — Telephone Encounter (Signed)
Patient states she has been cramping throughout the night and is still having some cramps about 15 minutes a part, no bleeding or leaking noted.  States the pain is worse when she lays down and feels better when she is walking. Informed patient she could be in early labor. If contractions became more consistent, painful making it difficult for her to walk, talk or breath, then [facility]  Pt verbalized understanding.

## 2018-01-23 LAB — URINE CULTURE

## 2018-01-28 ENCOUNTER — Ambulatory Visit (INDEPENDENT_AMBULATORY_CARE_PROVIDER_SITE_OTHER): Payer: Commercial Managed Care - PPO | Admitting: Obstetrics and Gynecology

## 2018-01-28 ENCOUNTER — Encounter: Payer: Self-pay | Admitting: Obstetrics and Gynecology

## 2018-01-28 VITALS — BP 117/82 | HR 102 | Wt 297.6 lb

## 2018-01-28 DIAGNOSIS — Z3483 Encounter for supervision of other normal pregnancy, third trimester: Secondary | ICD-10-CM | POA: Diagnosis not present

## 2018-01-28 DIAGNOSIS — Z1389 Encounter for screening for other disorder: Secondary | ICD-10-CM

## 2018-01-28 DIAGNOSIS — Z3A38 38 weeks gestation of pregnancy: Secondary | ICD-10-CM

## 2018-01-28 DIAGNOSIS — Z331 Pregnant state, incidental: Secondary | ICD-10-CM

## 2018-01-28 LAB — POCT URINALYSIS DIPSTICK OB
Glucose, UA: NEGATIVE
Ketones, UA: NEGATIVE
Leukocytes, UA: NEGATIVE
Nitrite, UA: NEGATIVE
POC,PROTEIN,UA: NEGATIVE

## 2018-01-28 NOTE — Progress Notes (Signed)
Patient ID: [name], female   DOB: 15-Feb-1985, 32 y.o.   MRN: [id number]    LOW-RISK PREGNANCY VISIT Patient name: [name] MRN [id number]  Date of birth: 15-Feb-1985 Chief Complaint:   Routine Prenatal Visit  History of Present Illness:   [name] is a 32 y.o. 463P2003 female at 4674w3d with an Estimated Date of Delivery: 02/08/18 being seen today for ongoing management of a low-risk pregnancy. Had previous C-section then vbac. Wants to delivery vaginally again.  Today she reports no complaints. Contractions: Irregular. Vag. Bleeding: None.  Movement: Present. denies leaking of fluid. Review of Systems:   Pertinent items are noted in HPI Denies abnormal vaginal discharge w/ itching/odor/irritation, headaches, visual changes, shortness of breath, chest pain, abdominal pain, severe nausea/vomiting, or problems with urination or bowel movements unless otherwise stated above. Pertinent History Reviewed:  Reviewed past medical,surgical, social, obstetrical and family history.  Reviewed problem list, medications and allergies. Physical Assessment:   Vitals:   01/28/18 0920  BP: 117/82  Pulse: (!) 102  Weight: 297 lb 9.6 oz (135 kg)  Body mass index is 54.43 kg/m.        Physical Examination:   General appearance: Well appearing, and in no distress  Mental status: Alert, oriented to person, place, and time  Skin: Warm & dry  Cardiovascular: Normal heart rate noted  Respiratory: Normal respiratory effort, no distress  Abdomen: Soft, gravid, nontender  Pelvic: Cervical exam performed head ballottable long vertex-2        Extremities: Edema: Trace  Fetal Status: Fetal Heart Rate (bpm): 135 Fundal Height: 42 cm Movement: Present   EFW last week 7+12  Results for orders placed or performed in visit on 01/28/18 (from the past 24 hour(s))  POC Urinalysis Dipstick OB   Collection Time: 01/28/18  9:16 AM  Result Value Ref Range   Color, UA     Clarity, UA     Glucose,  UA Negative Negative   Bilirubin, UA     Ketones, UA neg    Spec Grav, UA     Blood, UA trace    pH, UA     POC,PROTEIN,UA Negative Negative, Trace, Small (1+), Moderate (2+), Large (3+), 4+   Urobilinogen, UA     Nitrite, UA neg    Leukocytes, UA Negative Negative   Appearance     Odor      Assessment & Plan:  1) Low-risk pregnancy G3P2003 at 6774w3d with an Estimated Date of Delivery: 02/08/18     Meds: No orders of the defined types were placed in this encounter.  Labs/procedures today: None  Plan:    Follow-up: Return in about 1 week (around 02/04/2018) for LROB.  Orders Placed This Encounter  Procedures  . POC Urinalysis Dipstick OB   By signing my name below, I, Arnette NorrisMari Johnson, attest that this documentation has been prepared under the direction and in the presence of Tilda BurrowFerguson, Darcus Edds V, MD. Electronically Signed: Arnette NorrisMari Johnson Medical Scribe. 01/28/18. 9:44 AM.  I personally performed the services described in this documentation, which was SCRIBED in my presence. The recorded information has been reviewed and considered accurate. It has been edited as necessary during review. Tilda BurrowJohn V Toiya Morrish, MD

## 2018-01-31 ENCOUNTER — Inpatient Hospital Stay (HOSPITAL_COMMUNITY)
Admission: AD | Admit: 2018-01-31 | Discharge: 2018-02-02 | DRG: 807 | Disposition: A | Discharge status: Home / Self Care | Payer: Commercial Managed Care - PPO | Attending: Obstetrics and Gynecology | Admitting: Obstetrics and Gynecology

## 2018-01-31 ENCOUNTER — Encounter (HOSPITAL_COMMUNITY): Payer: Self-pay | Admitting: *Deleted

## 2018-01-31 ENCOUNTER — Inpatient Hospital Stay (HOSPITAL_COMMUNITY): Payer: Commercial Managed Care - PPO | Admitting: Anesthesiology

## 2018-01-31 ENCOUNTER — Other Ambulatory Visit: Payer: Self-pay

## 2018-01-31 DIAGNOSIS — O99214 Obesity complicating childbirth: Secondary | ICD-10-CM | POA: Diagnosis present

## 2018-01-31 DIAGNOSIS — E039 Hypothyroidism, unspecified: Secondary | ICD-10-CM | POA: Diagnosis present

## 2018-01-31 DIAGNOSIS — O99284 Endocrine, nutritional and metabolic diseases complicating childbirth: Secondary | ICD-10-CM | POA: Diagnosis present

## 2018-01-31 DIAGNOSIS — Z87891 Personal history of nicotine dependence: Secondary | ICD-10-CM | POA: Diagnosis not present

## 2018-01-31 DIAGNOSIS — O34219 Maternal care for unspecified type scar from previous cesarean delivery: Secondary | ICD-10-CM | POA: Diagnosis present

## 2018-01-31 DIAGNOSIS — F419 Anxiety disorder, unspecified: Secondary | ICD-10-CM | POA: Diagnosis present

## 2018-01-31 DIAGNOSIS — Z3A38 38 weeks gestation of pregnancy: Secondary | ICD-10-CM | POA: Diagnosis not present

## 2018-01-31 DIAGNOSIS — Z98891 History of uterine scar from previous surgery: Secondary | ICD-10-CM

## 2018-01-31 DIAGNOSIS — Z90721 Acquired absence of ovaries, unilateral: Secondary | ICD-10-CM

## 2018-01-31 DIAGNOSIS — Z3483 Encounter for supervision of other normal pregnancy, third trimester: Secondary | ICD-10-CM

## 2018-01-31 DIAGNOSIS — M6208 Separation of muscle (nontraumatic), other site: Secondary | ICD-10-CM

## 2018-01-31 LAB — CBC
HEMATOCRIT: 37.7 % (ref 36.0–46.0)
Hemoglobin: 12.1 g/dL (ref 12.0–15.0)
MCH: 28.6 pg (ref 26.0–34.0)
MCHC: 32.1 g/dL (ref 30.0–36.0)
MCV: 89.1 fL (ref 80.0–100.0)
Platelets: 347 10*3/uL (ref 150–400)
RBC: 4.23 MIL/uL (ref 3.87–5.11)
RDW: 14.7 % (ref 11.5–15.5)
WBC: 13.4 10*3/uL — ABNORMAL HIGH (ref 4.0–10.5)
nRBC: 0 % (ref 0.0–0.2)

## 2018-01-31 LAB — TYPE AND SCREEN
ABO/RH(D): O POS
Antibody Screen: NEGATIVE

## 2018-01-31 LAB — RPR: RPR Ser Ql: NONREACTIVE

## 2018-01-31 MED ORDER — LACTATED RINGERS IV SOLN
500.0000 mL | INTRAVENOUS | Status: DC | PRN
  Administered 2018-01-31: 500 mL via INTRAVENOUS
  Administered 2018-01-31: 250 mL via INTRAVENOUS

## 2018-01-31 MED ORDER — FENTANYL 2.5 MCG/ML BUPIVACAINE 1/10 % EPIDURAL INFUSION (WH - ANES)
14.0000 mL/h | INTRAMUSCULAR | Status: DC | PRN
  Administered 2018-01-31: 12 mL/h via EPIDURAL
  Administered 2018-02-01: 14 mL/h via EPIDURAL
  Filled 2018-01-31 (×2): qty 100

## 2018-01-31 MED ORDER — ONDANSETRON HCL 4 MG/2ML IJ SOLN
4.0000 mg | Freq: Four times a day (QID) | INTRAMUSCULAR | Status: DC | PRN
  Administered 2018-01-31: 4 mg via INTRAVENOUS
  Filled 2018-01-31: qty 2

## 2018-01-31 MED ORDER — DIPHENHYDRAMINE HCL 50 MG/ML IJ SOLN: 12.5000 mg | INTRAMUSCULAR | Status: DC | PRN

## 2018-01-31 MED ORDER — EPHEDRINE 5 MG/ML INJ
10.0000 mg | INTRAVENOUS | Status: DC | PRN
  Administered 2018-01-31: 10 mg via INTRAVENOUS
  Filled 2018-01-31 (×2): qty 4
  Filled 2018-01-31: qty 2

## 2018-01-31 MED ORDER — FLEET ENEMA 7-19 GM/118ML RE ENEM: 1.0000 | ENEMA | RECTAL | Status: DC | PRN

## 2018-01-31 MED ORDER — OXYTOCIN 40 UNITS IN LACTATED RINGERS INFUSION - SIMPLE MED
1.0000 m[IU]/min | INTRAVENOUS | Status: DC
  Administered 2018-01-31: 2 m[IU]/min via INTRAVENOUS
  Filled 2018-01-31: qty 1000

## 2018-01-31 MED ORDER — PHENYLEPHRINE 40 MCG/ML (10ML) SYRINGE FOR IV PUSH (FOR BLOOD PRESSURE SUPPORT)
80.0000 ug | PREFILLED_SYRINGE | INTRAVENOUS | Status: AC | PRN
  Administered 2018-01-31 (×3): 80 ug via INTRAVENOUS

## 2018-01-31 MED ORDER — EPHEDRINE 5 MG/ML INJ
10.0000 mg | INTRAVENOUS | Status: AC | PRN
  Administered 2018-01-31 (×2): 10 mg via INTRAVENOUS

## 2018-01-31 MED ORDER — ONDANSETRON 4 MG PO TBDP
4.0000 mg | ORAL_TABLET | Freq: Once | ORAL | Status: AC
  Administered 2018-01-31: 4 mg via ORAL
  Filled 2018-01-31: qty 1

## 2018-01-31 MED ORDER — OXYCODONE-ACETAMINOPHEN 5-325 MG PO TABS: 1.0000 | ORAL_TABLET | ORAL | Status: DC | PRN

## 2018-01-31 MED ORDER — FENTANYL CITRATE (PF) 100 MCG/2ML IJ SOLN
100.0000 ug | INTRAMUSCULAR | Status: DC | PRN
  Administered 2018-01-31 (×2): 100 ug via INTRAVENOUS
  Filled 2018-01-31 (×2): qty 2

## 2018-01-31 MED ORDER — PHENYLEPHRINE 40 MCG/ML (10ML) SYRINGE FOR IV PUSH (FOR BLOOD PRESSURE SUPPORT)
80.0000 ug | PREFILLED_SYRINGE | INTRAVENOUS | Status: DC | PRN
  Administered 2018-01-31: 80 ug via INTRAVENOUS
  Filled 2018-01-31 (×3): qty 10

## 2018-01-31 MED ORDER — LIDOCAINE HCL (PF) 1 % IJ SOLN
INTRAMUSCULAR | Status: DC | PRN
  Administered 2018-01-31 (×2): 5 mL via EPIDURAL

## 2018-01-31 MED ORDER — TERBUTALINE SULFATE 1 MG/ML IJ SOLN
0.2500 mg | Freq: Once | INTRAMUSCULAR | Status: DC | PRN
  Filled 2018-01-31: qty 1

## 2018-01-31 MED ORDER — LEVOTHYROXINE SODIUM 150 MCG PO TABS
150.0000 ug | ORAL_TABLET | Freq: Every day | ORAL | Status: DC
  Administered 2018-01-31: 150 ug via ORAL
  Filled 2018-01-31 (×2): qty 1

## 2018-01-31 MED ORDER — ACETAMINOPHEN 325 MG PO TABS: 650.0000 mg | ORAL_TABLET | ORAL | Status: DC | PRN

## 2018-01-31 MED ORDER — OXYTOCIN 40 UNITS IN LACTATED RINGERS INFUSION - SIMPLE MED
2.5000 [IU]/h | INTRAVENOUS | Status: DC
  Administered 2018-02-01: 2.5 [IU]/h via INTRAVENOUS

## 2018-01-31 MED ORDER — LIDOCAINE HCL (PF) 1 % IJ SOLN
30.0000 mL | INTRAMUSCULAR | Status: DC | PRN
  Administered 2018-02-01: 30 mL via SUBCUTANEOUS
  Filled 2018-01-31: qty 30

## 2018-01-31 MED ORDER — SOD CITRATE-CITRIC ACID 500-334 MG/5ML PO SOLN: 30.0000 mL | ORAL | Status: DC | PRN

## 2018-01-31 MED ORDER — LACTATED RINGERS IV SOLN
INTRAVENOUS | Status: DC
  Administered 2018-01-31 (×4): via INTRAVENOUS

## 2018-01-31 MED ORDER — SODIUM BICARBONATE 8.4 % IV SOLN
INTRAVENOUS | Status: DC | PRN
  Administered 2018-01-31: 5 mL via EPIDURAL

## 2018-01-31 MED ORDER — LACTATED RINGERS IV SOLN
500.0000 mL | Freq: Once | INTRAVENOUS | Status: AC
  Administered 2018-01-31: 500 mL via INTRAVENOUS

## 2018-01-31 MED ORDER — OXYCODONE-ACETAMINOPHEN 5-325 MG PO TABS
2.0000 | ORAL_TABLET | ORAL | Status: DC | PRN
  Administered 2018-02-01: 2 via ORAL
  Filled 2018-01-31: qty 2

## 2018-01-31 MED ORDER — OXYTOCIN BOLUS FROM INFUSION
500.0000 mL | Freq: Once | INTRAVENOUS | Status: AC
  Administered 2018-02-01: 500 mL via INTRAVENOUS

## 2018-01-31 NOTE — Progress Notes (Signed)
LABOR PROGRESS NOTE  [name] is a 32 y.o. G3P2003 at 8273w6d  admitted for SOL  Subjective: Feeling more uncomfortable with contractions  Objective: BP 99/76   Pulse (!) 102   Temp (!) 97.5 F (36.4 C) (Oral)   Resp 16   Ht 5\' 2"  (1.575 m)   Wt 136.1 kg   LMP 04/27/2017 (Exact Date)   BMI 54.87 kg/m  or  Vitals:   01/31/18 1136 01/31/18 1236 01/31/18 1344 01/31/18 1417  BP: (!) 99/57 103/67 111/78 99/76  Pulse: 83 98 82 (!) 102  Resp: 16  18 16   Temp: 97.7 F (36.5 C)   (!) 97.5 F (36.4 C)  TempSrc: Oral   Oral  Weight:      Height:        Dilation: 5 Effacement (%): 60 Cervical Position: Middle Station: Ballotable Presentation: Vertex(via US) Exam by:: Dr Janina MayoPhillip FHT: baseline rate 120s, moderate varibility, +acel, no decel Toco: regular q2-4262m  Labs: Lab Results  Component Value Date   WBC 13.4 (H) 01/31/2018   HGB 12.1 01/31/2018   HCT 37.7 01/31/2018   MCV 89.1 01/31/2018   PLT 347 01/31/2018    Patient Active Problem List   Diagnosis Date Noted  . Normal labor 01/31/2018  . Breech presentation 01/18/2018  . 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 History of vaginal delivery following previous cesarean delivery 07/02/2017  . Supervision of normal pregnancy 07/02/2017  . UTI (urinary tract infection) during pregnancy, first trimester 06/23/2017  . Anxiety 05/30/2015  . 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 Previous cesarean section complicating pregnancy 03/07/2015  . S/P right oophorectomy 03/07/2015  . Hypothyroidism 02/14/2015  . Breast pain 10/10/2014    Assessment / Plan: 32 y.o. G3P2003 at 773w6d here for SOL  Labor: FB out. Start pitocin.  Fetal Wellbeing:  Cat 1 Pain Control:  Desires IV pain meds prior to epidural Anticipated MOD:  SVD  Gwenevere AbbotNimeka Keiffer Piper, MD  OB Fellow  01/31/2018, 2:19 PM

## 2018-01-31 NOTE — Anesthesia Procedure Notes (Signed)
Epidural Patient location during procedure: OB Start time: 01/31/2018 8:00 PM End time: 01/31/2018 8:28 PM  Staffing Anesthesiologist: Jairo BenJackson, Evola Hollis, MD Performed: anesthesiologist   Preanesthetic Checklist Completed: patient identified, surgical consent, pre-op evaluation, timeout performed, IV checked, risks and benefits discussed and monitors and equipment checked  Epidural Patient position: sitting Prep: site prepped and draped and DuraPrep Patient monitoring: blood pressure, continuous pulse ox and heart rate Approach: midline Location: L2-L3 Injection technique: LOR air  Needle:  Needle type: Tuohy  Needle gauge: 17 G Needle length: 9 cm Needle insertion depth: 7 cm Catheter type: closed end flexible Catheter size: 19 Gauge Catheter at skin depth: 12 cm Test dose: negative (1% lidocaine)  Assessment Events: blood not aspirated, injection not painful, no injection resistance, negative IV test and no paresthesia  Additional Notes Pt identified in Labor room.  Monitors applied. Working IV access confirmed. Sterile prep, drape lumbar spine.  1% lido local L 2,3.  #17ga Touhy LOR air at 7 cm L 2,3, cath in easily to 12 cm skin. Test dose OK, cath dosed and infusion begun.  Patient asymptomatic, VSS, no heme aspirated, tolerated well.  Sandford Craze Jernee Murtaugh, MDReason for block:at surgeon's request and procedure for pain

## 2018-01-31 NOTE — Progress Notes (Addendum)
Labor Progress Note [name] is a 32 y.o. G3P2003 at 6230w6d presented for SOL  S:  Patient still having pain on left side after redose of epidural  O:  BP (!) 89/45   Pulse 95   Temp 98 F (36.7 C) (Oral)   Resp 17   Ht 5\' 2"  (1.575 m)   Wt 136.1 kg   LMP 04/27/2017 (Exact Date)   BMI 54.87 kg/m   Fetal Tracing:  Baseline: 135 Variability: moderate Accels: 15x15 Decels: none  Toco: 2-3   CVE: Dilation: 5 Effacement (%): 70 Cervical Position: Middle Station: -2 Presentation: Vertex Exam by:: Rayfield Citizenaroline, CNM   A&P: 32 y.o. G3P2003 7530w6d SOL #Labor: Progressing well. AROM risks and benefits reviewed with patient. Patient agreeable to plan of care. AROM with moderate amount of clear fluid. #Pain: epidural #FWB: Cat 1 #GBS negative  Rolm Bookbinderaroline M Antoney Biven, CNM 11:11 PM

## 2018-01-31 NOTE — Anesthesia Preprocedure Evaluation (Addendum)
Anesthesia Evaluation  Patient identified by MRN, date of birth, ID band Patient awake    Reviewed: Allergy & Precautions, NPO status , Patient's Chart, lab work & pertinent test results  History of Anesthesia Complications Negative for: history of anesthetic complications  Airway Mallampati: II  TM Distance: >3 FB Neck ROM: Full    Dental  (+) Dental Advisory Given, Poor Dentition   Pulmonary former smoker (quit 2012),    breath sounds clear to auscultation       Cardiovascular (-) hypertension Rhythm:Regular Rate:Normal     Neuro/Psych  Headaches, Anxiety Depression    GI/Hepatic Neg liver ROS, GERD  ,  Endo/Other  Hypothyroidism Morbid obesity  Renal/GU negative Renal ROS     Musculoskeletal   Abdominal (+) + obese,   Peds  Hematology plt 347k   Anesthesia Other Findings   Reproductive/Obstetrics (+) Pregnancy                            Anesthesia Physical Anesthesia Plan  ASA: III  Anesthesia Plan: Epidural   Post-op Pain Management:    Induction:   PONV Risk Score and Plan: 2 and Treatment may vary due to age or medical condition  Airway Management Planned: Natural Airway  Additional Equipment:   Intra-op Plan:   Post-operative Plan:   Informed Consent: I have reviewed the patients History and Physical, chart, labs and discussed the procedure including the risks, benefits and alternatives for the proposed anesthesia with the patient or authorized representative who has indicated his/her understanding and acceptance.   Dental advisory given  Plan Discussed with:   Anesthesia Plan Comments: (Patient identified. Risks/Benefits/Options discussed with patient including but not limited to bleeding, infection, nerve damage, paralysis, failed block, incomplete pain control, headache, blood pressure changes, nausea, vomiting, reactions to medication both or allergic, itching and  postpartum back pain. Confirmed with bedside nurse the patient's most recent platelet count. Confirmed with patient that they are not currently taking any anticoagulation, have any bleeding history or any family history of bleeding disorders. Patient expressed understanding and wished to proceed. All questions were answered. )       Anesthesia Quick Evaluation

## 2018-01-31 NOTE — H&P (Addendum)
LABOR AND DELIVERY ADMISSION HISTORY AND PHYSICAL NOTE  [name] is a 32 y.o. female G38P2003 with IUP at [redacted]w[redacted]d by LMP plus 6 week [place] presenting for contractions. Her pregnancy is complicated by hypothryoidism on Synthroid, history of VBAC, history of ICP,  She reports positive fetal movement. Contractions started around 9 pm on 01/30/18, and became more intense/painful. She continues to have painful contractions, but they are less painful than at home. She denies leakage of fluid or vaginal bleeding. Normal fetal movement.   Prenatal History/Complications: PNC at [facility] Pregnancy complications:  - hypothyroidism - history of TOLAC with successful VBAC - history of ICP  Past Medical History: Past Medical History:  Diagnosis Date  . Breast nodule 10/10/2014  . Breast pain 10/10/2014  . BV (bacterial vaginosis) 11/12/2012  . Depression   . Fatty liver   . Hypothyroid 02/14/2015  . Irregular menses 05/21/2013  . Migraines   . Obesity   . Other and unspecified ovarian cyst 05/31/2013   Right complex ?cystic vs solid mass on ovary will schedule appt with [facility]  . Pregnant 02/14/2015  . Thyroid disease     Past Surgical History: Past Surgical History:  Procedure Laterality Date  . CESAREAN SECTION    . COLONOSCOPY    . OOPHORECTOMY    . TONSILLECTOMY    . WISDOM TOOTH EXTRACTION      Obstetrical History: OB History    Gravida  3   Para  2   Term  2   Preterm      AB      Living  3     SAB      TAB      Ectopic      Multiple  1   Live Births  3           Social History: Social History   Socioeconomic History  . Marital status: Married    Spouse name: Not on file  . Number of children: Not on file  . Years of education: Not on file  . Highest education level: Not on file  Occupational History  . Not on file  Social Needs  . Financial resource strain: Not on file  . Food insecurity:    Worry: Not on file    Inability: Not on file  .  Transportation needs:    Medical: Not on file    Non-medical: Not on file  Tobacco Use  . Smoking status: Former Smoker    Packs/day: 0.25    Years: 8.00    Pack years: 2.00    Types: Cigarettes    Last attempt to quit: 10/07/2010    Years since quitting: 7.3  . Smokeless tobacco: Never Used  Substance and Sexual Activity  . Alcohol use: No    Comment: occ.; not now  . Drug use: No  . Sexual activity: Yes    Birth control/protection: None  Lifestyle  . Physical activity:    Days per week: Not on file    Minutes per session: Not on file  . Stress: Not on file  Relationships  . Social connections:    Talks on phone: Not on file    Gets together: Not on file    Attends religious service: Not on file    Active member of club or organization: Not on file    Attends meetings of clubs or organizations: Not on file    Relationship status: Not on file  Other Topics  Concern  . Not on file  Social History Narrative  . Not on file    Family History: Family History  Problem Relation Age of Onset  . Diabetes Maternal Grandmother   . Hypertension Maternal Grandmother   . Cancer Maternal Grandmother        breast  . Diabetes Maternal Grandfather   . Hypertension Maternal Grandfather   . Leukemia Maternal Grandfather   . Diabetes Mother   . Hypertension Mother   . Heart attack Mother   . Stroke Mother   . Kidney disease Paternal Grandfather   . Seizures Father   . Other Father        back problems  . Dementia Paternal Grandmother   . Cancer Maternal Aunt   . Diabetes Maternal Uncle     Allergies: Allergies  Allergen Reactions  . Imitrex [Sumatriptan]     Makes migraines worse.   . Maxalt [Rizatriptan]     Makes migraines worse  . Phenergan [Promethazine Hcl] Nausea And Vomiting  . Tape Other (See Comments)    Blisters on abdomen after C Section    Medications Prior to Admission  Medication Sig Dispense Refill Last Dose  . acetaminophen (TYLENOL) 325 MG tablet  Take 650 mg by mouth every 6 (six) hours as needed.   Taking  . Calcium Carbonate Antacid (TUMS PO) Take by mouth 3 (three) times daily.   Taking  . cyclobenzaprine (FLEXERIL) 10 MG tablet Take 1 tablet (10 mg total) by mouth 3 (three) times daily as needed for muscle spasms. 30 tablet 2 Taking  . Esomeprazole Magnesium (NEXIUM PO) Take by mouth daily.   Taking  . levothyroxine (SYNTHROID, LEVOTHROID) 150 MCG tablet Take 1 tablet (150 mcg total) by mouth daily before breakfast. 30 tablet 3 Taking  . Lysine 1000 MG TABS Take by mouth as needed.    Taking  . Prenat-FeCbn-FeAspGl-FA-Omega (OB COMPLETE PETITE) 35-5-1-200 MG CAPS Take 1 capsule by mouth daily. 30 capsule 12 Taking  . traMADol (ULTRAM) 50 MG tablet Take 1 tablet (50 mg total) by mouth every 6 (six) hours as needed for severe pain. 15 tablet 0 Taking     Review of Systems  All systems reviewed and negative except as stated in HPI  Physical Exam Blood pressure 96/80, pulse (!) 106, temperature 97.6 F (36.4 C), resp. rate 17, height 5\' 2"  (1.575 m), weight 136.1 kg, last menstrual period 04/27/2017. General appearance: alert, oriented, NAD Lungs: normal respiratory effort Heart: regular rate Abdomen: soft, non-tender; gravid, FH appropriate for GA Extremities: No calf swelling or tenderness Presentation: cephalic by US Fetal monitoring: baseline HR 125, moderate variability, + accels, - decels Uterine activity: difficult to trace Dilation: 3.5 Effacement (%): 40 Station: Ballotable Exam by:: lauren cox rn   Prenatal labs: ABO, Rh: --/--/O POS (12/08 16100905) Antibody: NEG (12/08 0905) Rubella: 3.60 (05/22 1044) RPR: Non Reactive (10/11 0845)  HBsAg: Negative (05/22 1044)  HIV: Non Reactive (10/11 0845)  GC/Chlamydia: neg/neg GBS:  NEG 2-hr GTT: 78/117/71 Genetic screening:  negative Anatomy US: normal female  Prenatal Transfer Tool  Maternal Diabetes: No Genetic Screening: Normal Maternal Ultrasounds/Referrals:  Normal Fetal Ultrasounds or other Referrals:  None Maternal Substance Abuse:  No Significant Maternal Medications:  Meds include: Other: Synthroid Significant Maternal Lab Results: None  No results found for this or any previous visit (from the past 24 hour(s)).  Patient Active Problem List   Diagnosis Date Noted  . Breech presentation 01/18/2018  . Diastasis of rectus abdominis  10/12/2017  . History of vaginal delivery following previous cesarean delivery 07/02/2017  . Supervision of normal pregnancy 07/02/2017  . UTI (urinary tract infection) during pregnancy, first trimester 06/23/2017  . Anxiety 05/30/2015  . 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 Previous cesarean section complicating pregnancy 03/07/2015  . S/P right oophorectomy 03/07/2015  . Hypothyroidism 02/14/2015  . Breast pain 10/10/2014    Assessment: [name] is a 32 y.o. G3P2003 at 7567w6d here for contractions in latent labor. Made significant cervical change in MAU, admission was requested given rate of change.   #Labor: latent labor at this time. Will allow to labor without augmentation at this time.  #Pain: currently declines pain medication. Open to epidural (had during last delivery) #FWB: Cat I #ID: GBS negative, intrapartum antibiotics not indicated #MOF: breast #MOC: would like BTL. Per patient she said that her physician told her an interval BTL would be better based on body habitus and concern for diastasis/hernia. She is open to either postpartum or interval BTL #Circ:  NA  Justine NullBridgid Wilson, MD Family Medicine PGY3 01/31/2018, 5:41 AM   CNM attestation:  I have seen and examined this patient; I agree with above documentation in the resident's note.   [name] is a 32 y.o. [id number] here for latent phase labor  PE: BP 109/75   Pulse 91   Temp 97.9 F (36.6 C) (Oral)   Resp 18   Ht 5\' 2"  (1.575 m)   Wt 136.1 kg   LMP 04/27/2017 (Exact Date)   BMI 54.87 kg/m   Resp: normal effort, no  distress Abd: gravid  ROS, labs, PMH reviewed  [facility]: [facility] Plan on IV pain meds initially if needed; hold off on epidural until labor progress seen  Arabella MerlesKimberly D Shaw CNM 01/31/2018, 9:15 AM

## 2018-01-31 NOTE — MAU Note (Signed)
Ctxs since 2100. Some clear to pink vag d/c. Ctxs stronger at home - have lightened up alittle. Nausea. Desires TOLAC

## 2018-01-31 NOTE — Progress Notes (Signed)
LABOR PROGRESS NOTE  [name] is a 32 y.o. G3P2003 at 777w6d  admitted for SOL  Subjective: Using intermittent IV fentanyl for pain   Objective: BP 109/71   Pulse 74   Temp (!) 97.5 F (36.4 C) (Oral)   Resp 16   Ht 5\' 2"  (1.575 m)   Wt 136.1 kg   LMP 04/27/2017 (Exact Date)   BMI 54.87 kg/m  or  Vitals:   01/31/18 1701 01/31/18 1731 01/31/18 1800 01/31/18 1830  BP: (!) 98/50 (!) 91/53 107/64 109/71  Pulse: 76 85 80 74  Resp: 16 16 16 16   Temp:    (!) 97.5 F (36.4 C)  TempSrc:    Oral  Weight:      Height:        Dilation: 5 Effacement (%): 60 Cervical Position: Middle Station: -2 Presentation: Vertex Exam by:: Dr Janina MayoPhillip FHT: baseline rate 120s, moderate varibility, + acel, no decel Toco: regular q2-2251m  Labs: Lab Results  Component Value Date   WBC 13.4 (H) 01/31/2018   HGB 12.1 01/31/2018   HCT 37.7 01/31/2018   MCV 89.1 01/31/2018   PLT 347 01/31/2018    Patient Active Problem List   Diagnosis Date Noted  . Normal labor 01/31/2018  . Breech presentation 01/18/2018  . 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 History of vaginal delivery following previous cesarean delivery 07/02/2017  . Supervision of normal pregnancy 07/02/2017  . UTI (urinary tract infection) during pregnancy, first trimester 06/23/2017  . Anxiety 05/30/2015  . 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 Previous cesarean section complicating pregnancy 03/07/2015  . S/P right oophorectomy 03/07/2015  . Hypothyroidism 02/14/2015  . Breast pain 10/10/2014    Assessment / Plan: 32 y.o. G3P2003 at 257w6d here for SOL  Labor: continue pitocin. Discussed AROM at next check with patient who is agreeable Fetal Wellbeing:  Cat 1 Pain Control:  Intermittent IV pain meds now. Epidural later Anticipated MOD:  SVD  Gwenevere AbbotNimeka Tonna Palazzi, MD  OB Fellow  01/31/2018, 7:01 PM

## 2018-01-31 NOTE — Progress Notes (Signed)
LABOR PROGRESS NOTE  [name] is a [age] y.o. G3P2003 at 6650w6d admitted for SOL  Subjective: Continues to have painful contractions. Discussed placing foley bulb, which patient is agreeable to  Objective: BP 109/75   Pulse 91   Temp 97.9 F (36.6 C) (Oral)   Resp 18   Ht 5\' 2"  (1.575 m)   Wt 136.1 kg   LMP 04/27/2017 (Exact Date)   BMI 54.87 kg/m  or  Vitals:   01/31/18 0615 01/31/18 0658 01/31/18 0720 01/31/18 0830  BP:  (!) 109/56 96/70 109/75  Pulse:  94 83 91  Resp:  16 18   Temp: 97.6 F (36.4 C)  97.9 F (36.6 C)   TempSrc: Oral  Oral   Weight:      Height:        Dilation: 2.5(2 inner os, 3 extermal os) Effacement (%): 40 Cervical Position: Posterior Station: Ballotable Presentation: Vertex Exam by:: Philipp DeputyKim Shaw, CNM FHT: baseline rate 120, moderate varibility, + acel, no decel Toco: regular q3-315m  Labs: Lab Results  Component Value Date   WBC 13.4 (H) 01/31/2018   HGB 12.1 01/31/2018   HCT 37.7 01/31/2018   MCV 89.1 01/31/2018   PLT 347 01/31/2018    Patient Active Problem List   Diagnosis Date Noted  . Normal labor 01/31/2018  . Breech presentation 01/18/2018  . 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 History of vaginal delivery following previous cesarean delivery 07/02/2017  . Supervision of normal pregnancy 07/02/2017  . UTI (urinary tract infection) during pregnancy, first trimester 06/23/2017  . Anxiety 05/30/2015  . 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 Previous cesarean section complicating pregnancy 03/07/2015  . S/P right oophorectomy 03/07/2015  . Hypothyroidism 02/14/2015  . Breast pain 10/10/2014    Assessment / Plan: 32 y.o. G3P2003 at 6550w6d here for SOL, however labor has not progressed, so will start induction with FB given pt at term and morbid obesity.   Labor: start induction with FB placed now. Add low dose pitocin if FB still in place at 4 hours  Fetal Wellbeing:  Cat 1 strip Pain Control:  Desires IV pain meds prior to  epidural Anticipated MOD:  SVD  Gwenevere AbbotNimeka Aryahna Spagna, MD   OB Fellow  01/31/2018, 9:36 AM

## 2018-01-31 NOTE — Discharge Instructions (Signed)
Braxton Hicks Contractions Contractions of the uterus can occur throughout pregnancy, but they are not always a sign that you are in labor. You may have practice contractions called Braxton Hicks contractions. These false labor contractions are sometimes confused with true labor.  Domestic Violence and Pregnancy Domestic violence is any type of physical, sexual, or emotional harm done by a current or former partner. Emotional abuse includes threatening and controlling behavior. Stalking is an example of threatening behavior. This type of abuse can happen to anyone, at any time in a relationship. Domestic violence is also called intimate partner violence. Intimate partner violence is especially dangerous during pregnancy because the abuse may affect both you and your developing baby. Call a domestic violence hotline or let your health care provider know if you are being physically or sexually abused, or if your partner's threatening or controlling behavior is making you feel unsafe. Getting help and support protects you, your pregnancy, and your baby. How does this affect me? For you, the negative effects of intimate partner violence can include:  Physical injury or death (homicide).  Emotional stress and fear.  Trouble sleeping.  Loss of appetite.  Digestion problems.  Frequent infections, including sexually transmitted infections.  High blood pressure.  Depression.  Anxiety.  Thoughts of hurting yourself or committing suicide. Intimate partner violence may negatively affect your pregnancy in these ways:  You are more likely to be injured or have poor health.  You may be less likely to get prenatal care.  You may not have good nutrition or gain a healthy amount of weight.  You may be more likely to smoke, use drugs, and drink alcohol during pregnancy to relieve stress.  You may be at higher risk of losing your pregnancy (miscarriage or stillbirth).  Your baby may be born  before 37 weeks of pregnancy (premature). How does this affect my baby? If you experience intimate partner violence during pregnancy:  Your baby may be injured.  Your baby may not survive pregnancy (miscarriage or stillbirth).  Your baby may be born premature, which can cause mental and physical problems.  Your baby may not grow well in your womb and may be born small (small for gestational age).  If you use alcohol, your baby may be born with fetal alcohol syndrome, which can cause birth defects and other problems.  You may have trouble bonding with your baby, which can lead to neglect.  You may be less likely to breastfeed, which is the healthiest way to feed your baby. Follow these instructions at home:   Let your health care provider know that you are experiencing intimate partner violence.  Do not bring an abusive partner with you to your prenatal visits. This will allow you to speak freely with your provider.  Learn about and use resources on intimate partner violence, such as the [employer] Violence Hotline: [place]fts.co.nz.  Consider counseling.  Consider getting a legal document that says your partner has to stay away from you (restraining order).  Have a support person stay with you in your home.  Have an escape plan to get to a safe place.  Do not smoke,use drugs, or drink alcohol to relieve stress.  Keep all your prenatal visits as told by your health care provider. This is important. Where to find more information  F[employer] Without Violence: futureswithoutviolence.org  [employer] Against Domestic Violence: CreditSham.com.pt  National Network to [employer] Violence: InsuranceSquad.es  [employer] on Domestic Violence: ARanked.fi  [employer]. Department  of Justice, Office on Violence Against Women: BeginnerSteps.com.ee Contact a health care provider if:  You experience any type of intimate partner violence at home.  You need help to quit smoking,  drinking, or taking drugs. Get help right away if:  You do not feel safe at home.  You have thoughts of hurting yourself or committing suicide. If you ever feel like you may hurt yourself or others, or have thoughts about taking your own life, get help right away. You can go to your nearest emergency department or call:  Your local emergency services (911 in the [place]  A suicide crisis helpline, such as the National Suicide Prevention Lifeline at (803)375-4969. This is open 24 hours a day. Summary  Domestic violence is also called intimate partner violence. Intimate partner violence can be physical, sexual, or emotional.  This type of abuse can happen to anyone at any time in a relationship, but the effects are especially dangerous during pregnancy.  Intimate partner violence increases your baby's risk of miscarriage, stillbirth, and premature birth.  Tell your health care provider if you experience any type of intimate partner violence. Get help right away if you do not feel safe at home. This information is not intended to replace advice given to you by your health care provider. Make sure you discuss any questions you have with your health care provider. Document Released: 10/16/2016 Document Revised: 10/16/2016 Document Reviewed: 10/16/2016 Elsevier Interactive Patient Education  2019 [employer].   First Stage of Labor Labor is your body's natural process of moving your baby and other structures, including the placenta and umbilical cord, out of your uterus. There are three stages of labor. How long each stage lasts is different for every woman. But certain events happen during each stage that are the same for everyone.  The first stage starts when true labor begins. This stage ends when your cervix, which is the opening from your uterus into your vagina, is completely open (dilated).  The second stage begins when your cervix is fully dilated and you start pushing. This stage  ends when your baby is born.  The third stage is the delivery of the organ that nourished your baby during pregnancy (placenta). First stage of labor As your due date gets closer, you may start to notice certain physical changes that mean labor is going to start soon. You may feel that your baby has dropped lower into your pelvis. You may experience irregular, often painless, contractions that go away when you walk around or lie down ([employer] contractions). This is also called false labor. The first stage of labor begins when you start having contractions that come at regular (evenly spaced) intervals and your cervix starts to get thinner and wider in preparation for your baby to pass through. Birth care providers measure the dilation of your cervix in centimeters (cm). One centimeter is a little less than one-half of an inch. The first stage ends when your cervix is dilated to 10 cm. The first stage of labor is divided into three phases:  Early phase.  Active phase.  Transitional phase. The length of the first stage of labor varies. It may be longer if this is your first pregnancy. You may spend most of this stage at home trying to relax and stay comfortable. How does this affect me? During the first stage of labor, you will move through three phases. What happens in the early phase?  You will start to have regular contractions that last  30-60 seconds. Contractions may come every 5-20 minutes. Keep track of your contractions and call your birth care provider.  Your water may break during this phase.  You may notice a clear or slightly bloody discharge of mucus (mucus plug) from your vagina.  Your cervix will dilate to 3-6 cm. What happens in the active phase? The active phase usually lasts 3-5 hours. You may go to the hospital or birth center around this time. During the active phase:  Your contractions will become stronger, longer, and more uncomfortable.  Your contractions may  last 45-90 seconds and come every 3-5 minutes.  You may feel lower back pain.  Your birth care providers may examine your cervix and feel your belly to find the position of your baby.  You may have a monitor strapped to your belly to measure your contractions and your baby's heart rate.  You may start using your pain management options.  Your cervix may be dilated to 6 cm and may start to dilate more quickly. What happens in the transitional phase? The transitional phase typically lasts from 30 minutes to 2 hours. At the end of this phase, your cervix will be fully dilated to 10 cm. During the transitional phase:  Contractions will get stronger and longer.  Contractions may last 60-90 seconds and come less than 2 minutes apart.  You may feel hot flashes, chills, or nausea. How does this affect my baby? During the first stage of labor, your baby will gradually move down into your birth canal. Follow these instructions at home and in the hospital or birth center:   When labor first begins, try to stay calm. You are still in the early phase. If it is night, try to get some sleep. If it is day, try to relax and save your energy. You may want to make some calls and get ready to go to the hospital or birth center.  When you are in the early phase, try these methods to help ease discomfort: ? Deep breathing and muscle relaxation. ? Taking a walk. ? Taking a warm bath or shower.  Drink some fluids and have a light snack if you feel like it.  Keep track of your contractions.  Based on the plan you created with your birth care provider, call when your contractions indicate it is time.  If your water breaks, note the time, color, and odor of the fluid.  When you are in the active phase, do your breathing exercises and rely on your support people and your team of birth care providers. Contact a health care provider if:  Your contractions are strong and regular.  You have lower back  pain or cramping.  Your water breaks.  You lose your mucus plug. Get help right away if you:  Have a severe headache that does not go away.  Have changes in your vision.  Have severe pain in your upper belly.  Do not feel the baby move.  Have bright red bleeding. Summary  The first stage of labor starts when true labor begins, and it ends when your cervix is dilated to 10 cm.  The first stage of labor has three phases: early, active, and transitional.  Your baby moves into the birth canal during the first stage of labor.  You may have contractions that become stronger and longer. You may also lose your mucus plug and have your water break.  Call your birth care provider when your contractions are frequent and strong enough  to go to the hospital or birth center. This information is not intended to replace advice given to you by your health care provider. Make sure you discuss any questions you have with your health care provider. Document Released: 04/13/2017 Document Revised: 10/18/2017 Document Reviewed: 04/13/2017 Elsevier Interactive Patient Education  2019 Elsevier Inc.   Heartburn During Pregnancy Heartburn is a type of pain or discomfort that can happen in the throat or chest. It is often described as a burning sensation. Heartburn is common during pregnancy because:  A hormone (progesterone) that is released during pregnancy may relax the valve (lower esophageal sphincter, or LES) that separates the esophagus from the stomach. This allows stomach acid to move up into the esophagus, causing heartburn.  The uterus gets larger and pushes up on the stomach, which pushes more acid into the esophagus. This is especially true in the later stages of pregnancy. Heartburn usually goes away or gets better after giving birth. What are the causes? Heartburn is caused by stomach acid backing up into the esophagus (reflux). Reflux can be triggered by:  Changing hormone  levels.  Large meals.  Certain foods and beverages, such as coffee, chocolate, onions, and peppermint.  Exercise.  Increased stomach acid production. What increases the risk? You are more likely to experience heartburn during pregnancy if you:  Had heartburn prior to becoming pregnant.  Have been pregnant more than once before.  Are overweight or obese. The likelihood that you will get heartburn also increases as you get farther along in your pregnancy, especially during the last trimester. What are the signs or symptoms? Symptoms of this condition include:  Burning pain in the chest or lower throat.  Bitter taste in the mouth.  Coughing.  Problems swallowing.  Vomiting.  Hoarse voice.  Asthma. Symptoms may get worse when you lie down or bend over. Symptoms are often worse at night. How is this diagnosed? This condition is diagnosed based on:  Your medical history.  Your symptoms.  Blood tests to check for a certain type of bacteria associated with heartburn.  Whether taking heartburn medicine relieves your symptoms.  Examination of the stomach and esophagus using a tube with a light and camera on the end (endoscopy). How is this treated? Treatment varies depending on how severe your symptoms are. Your health care provider may recommend:  Over-the-counter medicines (antacids or acid reducers) for mild heartburn.  Prescription medicines to decrease stomach acid or to protect your stomach lining.  Certain changes in your diet.  Raising the head of your bed so it is higher than the foot of the bed. This helps prevent stomach acid from backing up into the esophagus when you are lying down. Follow these instructions at home: Eating and drinking  Do not drink alcohol during your pregnancy.  Identify foods and beverages that make your symptoms worse, and avoid them.  Beverages that you may want to avoid include: ? Coffee and tea (with or without  caffeine). ? Energy drinks and sports drinks. ? Carbonated drinks or sodas. ? Citrus fruit juices.  Foods that you may want to avoid include: ? Chocolate and cocoa. ? Peppermint and mint flavorings. ? Garlic, onions, and horseradish. ? Spicy and acidic foods, including peppers, chili powder, curry powder, vinegar, hot sauces, and barbecue sauce. ? Citrus fruits, such as oranges, lemons, and limes. ? Tomato-based foods, such as red sauce, chili, and salsa. ? Fried and fatty foods, such as donuts, french fries, potato chips, and high-fat dressings. ?  High-fat meats, such as hot dogs, cold cuts, sausage, ham, and bacon. ? High-fat dairy items, such as whole milk, butter, and cheese.  Eat small, frequent meals instead of large meals.  Avoid drinking large amounts of liquid with your meals.  Avoid eating meals during the 2-3 hours before bedtime.  Avoid lying down right after you eat.  Do not exercise right after you eat. Medicines  Take over-the-counter and prescription medicines only as told by your health care provider.  Do not take aspirin, ibuprofen, or other NSAIDs unless your health care provider tells you to do that.  You may be instructed to avoid medicines that contain sodium bicarbonate. General instructions   If directed, raise the head of your bed about 6 inches (15 cm) by putting blocks under the legs. Sleeping with more pillows does not effectively relieve heartburn because it only changes the position of your head.  Do not use any products that contain nicotine or tobacco, such as cigarettes and e-cigarettes. If you need help quitting, ask your health care provider.  Wear loose-fitting clothing.  Try to reduce your stress, such as with yoga or meditation. If you need help managing stress, ask your health care provider.  Maintain a healthy weight. If you are overweight, work with your health care provider to safely lose weight.  Keep all follow-up visits as told  by your health care provider. This is important. Contact a health care provider if:  You develop new symptoms.  Your symptoms do not improve with treatment.  You have unexplained weight loss.  You have difficulty swallowing.  You make loud sounds when you breathe (wheeze).  You have a cough that does not go away.  You have frequent heartburn for more than 2 weeks.  You have nausea or vomiting that does not get better with treatment.  You have pain in your abdomen. Get help right away if:  You have severe chest pain that spreads to your arm, neck, or jaw.  You feel sweaty, dizzy, or light-headed.  You have shortness of breath.  You have pain when swallowing.  You vomit, and your vomit looks like blood or coffee grounds.  Your stool is bloody or black. This information is not intended to replace advice given to you by your health care provider. Make sure you discuss any questions you have with your health care provider. Document Released: 01/26/2000 Document Revised: 10/16/2015 Document Reviewed: 10/16/2015 Elsevier Interactive Patient Education  2019 [employer].   Preparing for Vaginal Birth After Cesarean Delivery Vaginal birth after cesarean delivery (VBAC) is giving birth vaginally after previously delivering a baby through a cesarean section (C-section). You and your health are provider will discuss your options and whether you may be a good candidate for VBAC. What are my options? After a cesarean delivery, your options for future deliveries may include:  Scheduled repeat cesarean delivery. This is done in a hospital with an operating room.  Trial of labor after cesarean (TOLAC). A successful TOLAC results in a vaginal delivery. If it is not successful, you will need to have a cesarean delivery. TOLAC should be attempted in facilities where an emergency cesarean delivery can be performed. It should not be done as a home birth. Talk with your health care provider  about the risks and benefits of each option early in your pregnancy. The best option for you will depend on your preferences and your overall health as well as your baby's. What should I know about my past  cesarean delivery? It is important to know what type of incision was made in your uterus in a past cesarean delivery. The type of incision can affect the success of your TOLAC. Types of incisions include:  Low transverse. This is a side-to-side cut low on your uterus. The scar on your skin looks like a horizontal line just above your pubic area. This type of cut is the most common and makes you a good candidate for TOLAC.  Low vertical. This is an up-and-down cut low on your uterus. The scar on your skin looks like a vertical line between your pubic area and belly button. This type of cut puts you at higher risk for problems during TOLAC.  High vertical or classical. This is an up-and-down cut high on your uterus. The scar on your skin looks like a vertical line that runs over the top of your belly button. This type of cut has the highest risk for problems and usually means that TOLAC is not an option. When is VBAC not an option? As you progress through your pregnancy, circumstances may change and you may need to reconsider your options. Your situation may also change even as you begin TOLAC. Your health care provider may not want you to attempt a VBAC if you:  Need to have labor started (induced) because your cervix is not ready for labor.  Have never had a vaginal delivery.  Have had more than two cesarean deliveries.  Are overdue.  Are pregnant with a very large baby.  Have a condition that causes high blood pressure (preeclampsia). Questions to ask your health care provider  Am I a good candidate for TOLAC?  What are my chances of a successful vaginal delivery?  Is my preferred birth location equipped for a TOLAC?  What are my pain management options during a TOLAC? Where to  find more information  American Congress of Obstetricians and Gynecologists: www.acog.org  [employer] of Nurse-Midwives: www.midwife.org Summary  Vaginal birth after cesarean delivery (VBAC) is giving birth vaginally after previously delivering a baby through a cesarean section (C-section).  VBAC may be a safe and appropriate option for you depending on your medical history and other risk factors. Talk with your health care provider about the options available to you, and the risks and benefits of each early in your pregnancy.  TOLAC should be attempted in facilities where emergency cesarean section procedures can be performed. This information is not intended to replace advice given to you by your health care provider. Make sure you discuss any questions you have with your health care provider. Document Released: 10/16/2010 Document Revised: 05/09/2016 Document Reviewed: 05/09/2016 Elsevier Interactive Patient Education  2019 [employer].   Signs and Symptoms of Labor Labor is your body's natural process of moving your baby, placenta, and umbilical cord out of your uterus. The process of labor usually starts when your baby is full-term, between 56 and 40 weeks of pregnancy. How will I know when I am close to going into labor? As your body prepares for labor and the birth of your baby, you may notice the following symptoms in the weeks and days before true labor starts:  Having a strong desire to get your home ready to receive your new baby. This is called nesting. Nesting may be a sign that labor is approaching, and it may occur several weeks before birth. Nesting may involve cleaning and organizing your home.  Passing a small amount of thick, bloody mucus out of your  vagina (normal bloody show or losing your mucus plug). This may happen more than a week before labor begins, or it might occur right before labor begins as the opening of the cervix starts to widen (dilate). For some  women, the entire mucus plug passes at once. For others, smaller portions of the mucus plug may gradually pass over several days.  Your baby moving (dropping) lower in your pelvis to get into position for birth (lightening). When this happens, you may feel more pressure on your bladder and pelvic bone and less pressure on your ribs. This may make it easier to breathe. It may also cause you to need to urinate more often and have problems with bowel movements.  Having "practice contractions" (Braxton Hicks contractions) that occur at irregular (unevenly spaced) intervals that are more than 10 minutes apart. This is also called false labor. False labor contractions are common after exercise or sexual activity, and they will stop if you change position, rest, or drink fluids. These contractions are usually mild and do not get stronger over time. They may feel like: ? A backache or back pain. ? Mild cramps, similar to menstrual cramps. ? Tightening or pressure in your abdomen. Other early symptoms that labor may be starting soon include:  Nausea or loss of appetite.  Diarrhea.  Having a sudden burst of energy, or feeling very tired.  Mood changes.  Having trouble sleeping. How will I know when labor has begun? Signs that true labor has begun may include:  Having contractions that come at regular (evenly spaced) intervals and increase in intensity. This may feel like more intense tightening or pressure in your abdomen that moves to your back. ? Contractions may also feel like rhythmic pain in your upper thighs or back that comes and goes at regular intervals. ? For first-time mothers, this change in intensity of contractions often occurs at a more gradual pace. ? Women who have given birth before may notice a more rapid progression of contraction changes.  Having a feeling of pressure in the vaginal area.  Your water breaking (rupture of membranes). This is when the sac of fluid that surrounds  your baby breaks. When this happens, you will notice fluid leaking from your vagina. This may be clear or blood-tinged. Labor usually starts within 24 hours of your water breaking, but it may take longer to begin. ? Some women notice this as a gush of fluid. ? Others notice that their underwear repeatedly becomes damp. Follow these instructions at home:   When labor starts, or if your water breaks, call your health care provider or nurse care line. Based on your situation, they will determine when you should go in for an exam.  When you are in early labor, you may be able to rest and manage symptoms at home. Some strategies to try at home include: ? Breathing and relaxation techniques. ? Taking a warm bath or shower. ? Listening to music. ? Using a heating pad on the lower back for pain. If you are directed to use heat:  Place a towel between your skin and the heat source.  Leave the heat on for 20-30 minutes.  Remove the heat if your skin turns bright red. This is especially important if you are unable to feel pain, heat, or cold. You may have a greater risk of getting burned. Get help right away if:  You have painful, regular contractions that are 5 minutes apart or less.  Labor starts before you  are [redacted] weeks along in your pregnancy.  You have a fever.  You have a headache that does not go away.  You have bright red blood coming from your vagina.  You do not feel your baby moving.  You have a sudden onset of: ? Severe headache with vision problems. ? Nausea, vomiting, or diarrhea. ? Chest pain or shortness of breath. These symptoms may be an emergency. If your health care provider recommends that you go to the hospital or birth center where you plan to deliver, do not drive yourself. Have someone else drive you, or call emergency services (911 in the [place] Summary  Labor is your body's natural process of moving your baby, placenta, and umbilical cord out of your  uterus.  The process of labor usually starts when your baby is full-term, between 31 and 40 weeks of pregnancy.  When labor starts, or if your water breaks, call your health care provider or nurse care line. Based on your situation, they will determine when you should go in for an exam. This information is not intended to replace advice given to you by your health care provider. Make sure you discuss any questions you have with your health care provider. Document Released: 07/05/2016 Document Revised: 07/05/2016 Document Reviewed: 07/05/2016 Elsevier Interactive Patient Education  2019 [employer].   Warning Signs During Pregnancy A pregnancy lasts about 40 weeks, starting from the first day of your last period until the baby is born. Pregnancy is divided into three phases called trimesters.  The first trimester refers to week 1 through week 13 of pregnancy.  The second trimester is the start of week 14 through the end of week 27.  The third trimester is the start of week 28 until you deliver your baby. During each trimester of pregnancy, certain signs and symptoms may indicate a problem. Talk with your health care provider about your current health and any medical conditions you have. Make sure you know the symptoms that you should watch for and report. How does this affect me? Warning signs in the third trimester As you approach the third trimester, your baby is growing and your body is preparing for the birth of your baby. In your third trimester, be sure to let your health care provider know if:  You have signs and symptoms of infection, including a fever.  You have vaginal bleeding.  You notice that your baby is moving less than usual or is not moving.  You have nausea, vomiting, or diarrhea that lasts for longer than a day.  You have a severe headache that does not go away.  You have vision changes, including seeing spots or having blurry or double vision.  You have  increased swelling in your hands or face. How does this affect my baby? Throughout your pregnancy, always report any of the warning signs of a problem to your health care provider. This can help prevent complications that may affect your baby, including:  Increased risk for premature birth.  Infection that may be transmitted to your baby.  Increased risk for stillbirth. Contact a health care provider if:  You have any of the warning signs of a problem for the current trimester of your pregnancy.  Any of the following apply to you during any trimester of pregnancy: ? You have strong emotions, such as sadness or anxiety, that interfere with work or personal relationships. ? You feel unsafe in your home and need help finding a safe place to live. ?  You are using tobacco products, alcohol, or drugs and you need help to stop. Get help right away if: You have signs or symptoms of labor before 37 weeks of pregnancy. These include:  Contractions that are 5 minutes or less apart, or that increase in frequency, intensity, or length.  Sudden, sharp abdominal pain or low back pain.  Uncontrolled gush or trickle of fluid from your vagina. Summary  A pregnancy lasts about 40 weeks, starting from the first day of your last period until the baby is born. Pregnancy is divided into three phases called trimesters. Each trimester has warning signs to watch for.  Always report any warning signs to your health care provider in order to prevent complications that may affect both you and your baby.  Talk with your health care provider about your current health and any medical conditions you have. Make sure you know the symptoms that you should watch for and report. This information is not intended to replace advice given to you by your health care provider. Make sure you discuss any questions you have with your health care provider. Document Released: 11/14/2016 Document Revised: 11/14/2016 Document  Reviewed: 11/14/2016 Elsevier Interactive Patient Education  2019 [employer]  What are Deberah PeltonBraxton Hicks contractions? Braxton Hicks contractions are tightening movements that occur in the muscles of the uterus before labor. Unlike true labor contractions, these contractions do not result in opening (dilation) and thinning of the cervix. Toward the end of pregnancy (32-34 weeks), Braxton Hicks contractions can happen more often and may become stronger. These contractions are sometimes difficult to tell apart from true labor because they can be very uncomfortable. You should not feel embarrassed if you go to the hospital with false labor. Sometimes, the only way to tell if you are in true labor is for your health care provider to look for changes in the cervix. The health care provider will do a physical exam and may monitor your contractions. If you are not in true labor, the exam should show that your cervix is not dilating and your water has not broken. If there are no other health problems associated with your pregnancy, it is completely safe for you to be sent home with false labor. You may continue to have Braxton Hicks contractions until you go into true labor. How to tell the difference between true labor and false labor True labor  Contractions last 30-70 seconds.  Contractions become very regular.  Discomfort is usually felt in the top of the uterus, and it spreads to the lower abdomen and low back.  Contractions do not go away with walking.  Contractions usually become more intense and increase in frequency.  The cervix dilates and gets thinner. False labor  Contractions are usually shorter and not as strong as true labor contractions.  Contractions are usually irregular.  Contractions are often felt in the front of the lower abdomen and in the groin.  Contractions may go away when you walk around or change positions while lying down.  Contractions get weaker and are  shorter-lasting as time goes on.  The cervix usually does not dilate or become thin. Follow these instructions at home:   Take over-the-counter and prescription medicines only as told by your health care provider.  Keep up with your usual exercises and follow other instructions from your health care provider.  Eat and drink lightly if you think you are going into labor.  If Braxton Hicks contractions are making you uncomfortable: ? Change your position  from lying down or resting to walking, or change from walking to resting. ? Sit and rest in a tub of warm water. ? Drink enough fluid to keep your urine pale yellow. Dehydration may cause these contractions. ? Do slow and deep breathing several times an hour.  Keep all follow-up prenatal visits as told by your health care provider. This is important. Contact a health care provider if:  You have a fever.  You have continuous pain in your abdomen. Get help right away if:  Your contractions become stronger, more regular, and closer together.  You have fluid leaking or gushing from your vagina.  You pass blood-tinged mucus (bloody show).  You have bleeding from your vagina.  You have low back pain that you never had before.  You feel your babys head pushing down and causing pelvic pressure.  Your baby is not moving inside you as much as it used to. Summary  Contractions that occur before labor are called Braxton Hicks contractions, false labor, or practice contractions.  Braxton Hicks contractions are usually shorter, weaker, farther apart, and less regular than true labor contractions. True labor contractions usually become progressively stronger and regular, and they become more frequent.  Manage discomfort from [facility] contractions by changing position, resting in a warm bath, drinking plenty of water, or practicing deep breathing. This information is not intended to replace advice given to you by your health care  provider. Make sure you discuss any questions you have with your health care provider. Document Released: 06/13/2016 Document Revised: 11/12/2016 Document Reviewed: 06/13/2016 Elsevier Interactive Patient Education  2019 [employer].

## 2018-01-31 NOTE — Progress Notes (Signed)
Labor Progress Note Neita GoodnightKatelyn C Hirota is a 32 y.o. G3P2003 at 8445w6d presented for SOL  S:  Patient just had epidural placed. Still having pain bilaterally and hypotension s/p phenylephrine and epinephrine.  O:  BP (!) 97/51   Pulse (!) 107   Temp (!) 97.5 F (36.4 C) (Oral)   Resp 16   Ht 5\' 2"  (1.575 m)   Wt 136.1 kg   LMP 04/27/2017 (Exact Date)   BMI 54.87 kg/m   Fetal Tracing:  Baseline: 150 Variability: moderate Accels: 15x15 Decels: variable  Toco: 2-4   CVE: Dilation: 5 Effacement (%): 60 Cervical Position: Middle Station: -2 Presentation: Vertex Exam by:: Cleone Slimaroline Andres Bantz, CNM   A&P: 32 y.o. G3P2003 1945w6d SOL #Labor: Cervix unchanged but station too high for AROM. Will recheck in 2 hours #Pain: Anesthesia to bedside to adjust epidural #FWB: Cat 2 but reassuring overall #GBS negative  Rolm Bookbinderaroline M Rasheem Figiel, CNM 9:26 PM

## 2018-02-01 ENCOUNTER — Encounter (HOSPITAL_COMMUNITY): Payer: Self-pay

## 2018-02-01 DIAGNOSIS — Z3A38 38 weeks gestation of pregnancy: Secondary | ICD-10-CM

## 2018-02-01 LAB — CBC
HCT: 31 % — ABNORMAL LOW (ref 36.0–46.0)
Hemoglobin: 9.8 g/dL — ABNORMAL LOW (ref 12.0–15.0)
MCH: 28.5 pg (ref 26.0–34.0)
MCHC: 31.6 g/dL (ref 30.0–36.0)
MCV: 90.1 fL (ref 80.0–100.0)
Platelets: 268 10*3/uL (ref 150–400)
RBC: 3.44 MIL/uL — ABNORMAL LOW (ref 3.87–5.11)
RDW: 14.7 % (ref 11.5–15.5)
WBC: 19.6 10*3/uL — ABNORMAL HIGH (ref 4.0–10.5)
nRBC: 0 % (ref 0.0–0.2)

## 2018-02-01 MED ORDER — IBUPROFEN 600 MG PO TABS
600.0000 mg | ORAL_TABLET | Freq: Four times a day (QID) | ORAL | Status: DC
  Administered 2018-02-01 – 2018-02-02 (×6): 600 mg via ORAL
  Filled 2018-02-01 (×6): qty 1

## 2018-02-01 MED ORDER — DIBUCAINE 1 % RE OINT: 1.0000 "application " | TOPICAL_OINTMENT | RECTAL | Status: DC | PRN

## 2018-02-01 MED ORDER — DIPHENHYDRAMINE HCL 25 MG PO CAPS: 25.0000 mg | ORAL_CAPSULE | Freq: Four times a day (QID) | ORAL | Status: DC | PRN

## 2018-02-01 MED ORDER — WITCH HAZEL-GLYCERIN EX PADS: 1.0000 "application " | MEDICATED_PAD | CUTANEOUS | Status: DC | PRN

## 2018-02-01 MED ORDER — SIMETHICONE 80 MG PO CHEW: 80.0000 mg | CHEWABLE_TABLET | ORAL | Status: DC | PRN

## 2018-02-01 MED ORDER — TETANUS-DIPHTH-ACELL PERTUSSIS 5-2.5-18.5 LF-MCG/0.5 IM SUSP: 0.5000 mL | Freq: Once | INTRAMUSCULAR | Status: DC

## 2018-02-01 MED ORDER — ACETAMINOPHEN 325 MG PO TABS
650.0000 mg | ORAL_TABLET | ORAL | Status: DC | PRN
  Administered 2018-02-01 (×2): 650 mg via ORAL
  Filled 2018-02-01 (×2): qty 2

## 2018-02-01 MED ORDER — ONDANSETRON HCL 4 MG/2ML IJ SOLN: 4.0000 mg | INTRAMUSCULAR | Status: DC | PRN

## 2018-02-01 MED ORDER — ONDANSETRON HCL 4 MG PO TABS: 4.0000 mg | ORAL_TABLET | ORAL | Status: DC | PRN

## 2018-02-01 MED ORDER — ZOLPIDEM TARTRATE 5 MG PO TABS: 5.0000 mg | ORAL_TABLET | Freq: Every evening | ORAL | Status: DC | PRN

## 2018-02-01 MED ORDER — SENNOSIDES-DOCUSATE SODIUM 8.6-50 MG PO TABS
2.0000 | ORAL_TABLET | ORAL | Status: DC
  Administered 2018-02-01: 2 via ORAL
  Filled 2018-02-01: qty 2

## 2018-02-01 MED ORDER — COCONUT OIL OIL: 1.0000 "application " | TOPICAL_OIL | Status: DC | PRN

## 2018-02-01 MED ORDER — PRENATAL MULTIVITAMIN CH
1.0000 | ORAL_TABLET | Freq: Every day | ORAL | Status: DC
  Administered 2018-02-01 – 2018-02-02 (×2): 1 via ORAL
  Filled 2018-02-01 (×2): qty 1

## 2018-02-01 MED ORDER — BENZOCAINE-MENTHOL 20-0.5 % EX AERO
1.0000 "application " | INHALATION_SPRAY | CUTANEOUS | Status: DC | PRN
  Administered 2018-02-01: 1 via TOPICAL
  Filled 2018-02-01: qty 56

## 2018-02-01 NOTE — Anesthesia Postprocedure Evaluation (Signed)
Anesthesia Post Note  Patient: [name]  Procedure(s) Performed: AN AD HOC LABOR EPIDURAL     Patient location during evaluation: Mother Baby Anesthesia Type: Epidural Level of consciousness: awake and alert Pain management: pain level controlled Vital Signs Assessment: post-procedure vital signs reviewed and stable Respiratory status: spontaneous breathing, nonlabored ventilation and respiratory function stable Cardiovascular status: stable Postop Assessment: no headache, no backache, epidural receding, no apparent nausea or vomiting, patient able to bend at knees, able to ambulate and adequate PO intake Anesthetic complications: no    Last Vitals:  Vitals:   02/01/18 0502 02/01/18 0908  BP: 117/73 98/68  Pulse: 78 75  Resp: 18 18  Temp: 36.7 C 36.8 C    Last Pain:  Vitals:   02/01/18 0908  TempSrc: Oral  PainSc:    Pain Goal: Patients Stated Pain Goal: 0 (01/31/18 0148)               Laban EmperorMalinova,Peggy Monk Hristova

## 2018-02-01 NOTE — Lactation Note (Signed)
This note was copied from a baby's chart. Lactation Consultation Note  Patient Name: [name] Today's Date: 02/01/2018 Reason for consult: Initial assessment;Term;Maternal endocrine disorder Type of Endocrine Disorder?: Thyroid(hypothyroidism during the pregnancy)  15 hours old FT female who is being exclusively BF by her mother, she's a P3 and somehow experienced BF. She was able to BF her first child (set of twins) for a month before her milk dry out, but she mainly pumped and bottle. The same thing happened with her second child, mom voiced none of their children latched on well and she had to pump and bottle for all of them and her milk just dry out in a month. But she feels pretty positive about this one and told LC "She's really doing it, she latches on with no problem".   Mom participated in [place] WIC [place] during the [place] and she already knows how to hand express. When revising hand expression with her, a small droplet of colostrum was observed on each breast upon several breast compressions, mom has Hx of hypothyroidism. She has a Medela DEBP at home, but just the motor, she doesn't have the pump kit anymore.  Baby was asleep when entering the room, offered assistance with latch and mom agreed to wake baby up to feed. LC took baby STS to mom's left breast in cross cradle position and she was able to latch right away with a couple of audible swallows noted upon doing some breast compressions. Baby fed for 4 minutes before falling asleep.  Mom was concerned about her milk supply drying out again. LC offered to set up with a DEBP and discussed the benefits of starting pumping early on at the hospital. Pump instructions, cleaning and storage were reviewed. LC also showed mom how to use her hospital pump kit with the pump motor she has at home with just cutting off the tubing. Both parents voiced understanding. Cluster feeding, normal newborn behavior,  lactogenesis II and feeding cues were discussed.  Feeding plan:  1. Encouraged mom to feed baby STS 8-12 times/24 hours or sooner if feeding cues are present 2. Mom will double pump every 3 hours (preferably after feedings) and at least once at night, a minimum of 6-8 pumping sessions in 24 hours  BF brochure. BF resources and feeding diary were also reviewed. Parents reported all questions and concerns were answered, they're both aware of LC services and will call PRN.  Maternal Data Formula Feeding for Exclusion: No Has patient been taught Hand Expression?: Yes Does the patient have breastfeeding experience prior to this delivery?: Yes  Feeding Feeding Type: Breast Fed  LATCH Score Latch: Grasps breast easily, tongue down, lips flanged, rhythmical sucking.  Audible Swallowing: A few with stimulation(only two with breast compressions)  Type of Nipple: Everted at rest and after stimulation(slightly short shafted)  Comfort (Breast/Nipple): Soft / non-tender  Hold (Positioning): Assistance needed to correctly position infant at breast and maintain latch.(advised mom to switch from typical cradle to cross cradle)  LATCH Score: 8  Interventions Interventions: Breast feeding basics reviewed;Assisted with latch;Skin to skin;Breast massage;Hand express;Breast compression;Adjust position;Support pillows;DEBP  Lactation Tools Discussed/Used Tools: Pump Breast pump type: Double-Electric Breast Pump WIC Program: Yes Pump Review: Setup, frequency, and cleaning Initiated by:: M Date initiated:: 02/01/18   Consult Status Consult Status: Follow-up Date: 02/02/18 Follow-up type: In-patient     S  02/01/2018, 5:24 PM    

## 2018-02-01 NOTE — Progress Notes (Signed)
Parent request formula to supplement breast feeding due to baby's difficulty to achieve latch and maintain sucking, Mother concerned over not eating well . Mom has started pumping , but  not achieving adequate amounts to supplement,  Parents have been informed of small tummy size of newborn, taught hand expression and understand the possible consequences of formula to the health of the infant. The possible consequences shared with patient include 1) Loss of confidence in breastfeeding 2) Engorgement 3) Allergic sensitization of baby(asthma/allergies) and 4) decreased milk supply for mother.After discussion of the above the mother decided to  supplement with formula.  The tool used to give formula supplement will be  curve tip syringe  

## 2018-02-02 ENCOUNTER — Ambulatory Visit: Payer: Commercial Managed Care - PPO | Admitting: "Endocrinology

## 2018-02-02 MED ORDER — HYDROCORTISONE 1 % EX CREA
TOPICAL_CREAM | CUTANEOUS | Status: DC | PRN
  Administered 2018-02-02: 07:00:00 via TOPICAL
  Filled 2018-02-02: qty 28

## 2018-02-02 MED ORDER — SIMETHICONE 80 MG PO CHEW: 80.0000 mg | CHEWABLE_TABLET | ORAL | 0 refills | Status: DC | PRN

## 2018-02-02 MED ORDER — SENNOSIDES-DOCUSATE SODIUM 8.6-50 MG PO TABS: 2.0000 | ORAL_TABLET | ORAL | 0 refills | Status: DC

## 2018-02-02 MED ORDER — IBUPROFEN 600 MG PO TABS: 600.0000 mg | ORAL_TABLET | Freq: Four times a day (QID) | ORAL | 0 refills | Status: DC

## 2018-02-02 NOTE — Lactation Note (Addendum)
This note was copied from a baby's chart. Lactation Consultation Note  Patient Name: Lisa Dellia BeckwithKatelyn Wiley ZOXWR'UToday's Date: 02/02/2018 Reason for consult: Follow-up assessment;Term;Maternal endocrine disorder;Infant weight loss(7% weight loss ) Type of Endocrine Disorder?: Thyroid  Baby is 1934 hours old LC reviewed and updated the doc flow sheets per mom and dad .  Mom mentioned the baby hasn't fed since 8 am and she has attempted.  Baby woke up during the consult and mom latched the baby independently with depth / flanged lips and  Swallows.  Per mom comfortable.  Mom denies soreness, sore nipple and engorgement prevention and tx reviewed. LC instructed mom on the use of  The hand pump to keep it simple and shells due to the edematous areolas to prevent soreness.  Discuss nutritive vs non - nutritive feeding patterns and the importance of watching for hanging out latched.  LC also mentioned to mom if the breast are to full to start when baby is due to feed ./ release down with hand expressing  Or hand pump so the nipple / areola complex is compressible / and reverse pressure.  If the baby only feeds 1st breast and doesn't get to the 2nd breast , release down with hand expressing or pumping.  Mother informed of post-discharge support and given phone number to the lactation department, including services for phone call assistance; out-patient appointments; and breastfeeding support group. List of other breastfeeding resources in the community given in the handout. Encouraged mother to call for problems or concerns related to breastfeeding. Mom and dad receptive to teaching and expressed appreciation for [facility] assist.   Maternal Data Has patient been taught Hand Expression?: Yes(prior to feeding mom hand expressed and showed LC the compressibility of the areola - edema noted / indicating shells )  Feeding Feeding Type: Breast Fed  LATCH Score Latch: Grasps breast easily, tongue down, lips flanged,  rhythmical sucking.  Audible Swallowing: Spontaneous and intermittent  Type of Nipple: Everted at rest and after stimulation  Comfort (Breast/Nipple): Soft / non-tender  Hold (Positioning): No assistance needed to correctly position infant at breast.  LATCH Score: 10  Interventions Interventions: Breast feeding basics reviewed;Skin to skin;Breast massage;Hand express;Breast compression;Adjust position;Support pillows;Position options;Hand pump;DEBP;Shells  Lactation Tools Discussed/Used Tools: Pump;Flanges;Shells Flange Size: 24;27 Breast pump type: Double-Electric Breast Pump WIC Program: Yes Pump Review: Setup, frequency, and cleaning;Milk Storage Initiated by:: MAI / reviewed  Date initiated:: 02/02/18   Consult Status Consult Status: Complete Date: 02/02/18    [name] 02/02/2018, 12:05 PM

## 2018-02-02 NOTE — Progress Notes (Signed)
CLINICAL SOCIAL WORK MATERNAL/CHILD NOTE  Patient Details  Name: [name] MRN: [id number] Date of Birth: 02/01/2016  Date:  02/02/2018  Clinical Social Worker Initiating Note:  Abundio Miu, [place]   Date/Time: Initiated:  02/02/18/1237             Child's Name:  [name]   Biological Parents:  Mother, Father(Father - Alfredo Batty)   Need for Interpreter:  None   Reason for Referral:  Behavioral Health Concerns   Address:  [place] [address]    Phone number:  [phone] (home)     Additional phone number:   Household Members/Support Persons (HM/SP):   Household Member/Support Person 1, Household Member/Support Person 2, Household Member/Support Person 3, Household Member/Support Person 4   HM/SP Name Relationship DOB or Age  HM/SP -1 Rober Drake Leach FOB/Husband   HM/SP -[place] daughter  05-04-10  HM/SP -3 Lise Auer daughter 05-04-10  HM/SP -[place] son 10-21-15  HM/SP -5     HM/SP -6     HM/SP -7     HM/SP -8       Natural Supports (not living in the home): Parent, Extended Family   Professional Supports:None   Employment:Full-time   Type of Work: Therapist, sports at [employer]    Education:  [employer] graduate(Associates Degree)   Homebound arranged:    Printmaker   Other Resources: [facility]   Cultural/Religious Considerations Which May Impact Care:   Strengths: Ability to meet basic needs , Home prepared for child , Pediatrician chosen   Psychotropic Medications:         Pediatrician:    [facility]  Pediatrician List:   [facility]     Pediatrician Fax Number:    Risk Factors/Current Problems: Mental Health Concerns    Cognitive State: Able to Concentrate , Insightful , Alert , Linear Thinking    Mood/Affect:  Calm , Interested , Relaxed    CSW Assessment:CSW met with MOB at bedside regarding consult for behavioral health concerns. MOB was relaxed and engaged during assessment. CSW introduced self and explained reason for consult. CSW and MOB discussed MOB's mental health history. 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 MOB reported that her symptoms were auditory and visual hallucinations and that her PCP started her on Celexa. MOB reported that her PCP discontinued her Celexa about 4 years ago and that she is doing well being off the medication. MOB reported that she also lost her mother about 4 years ago and that it was a hard time. MOB and CSW discussed spoke about how she coped with the loss of her mother. MOB reported that it took awhile and that she would just take a shower and cry when she needed to. MOB reported that she was also on antidepressants after her mother passed. MOB reported that she has a good support system and that she is able to speak with them about her feelings.  MOB reported that she didn't feel like she needed to be on any mediation currently and denied current depressive symptoms. MOB presented calm and was feeding baby during the assessment. MOB did not demonstrate any acute mental health signs/symptoms. CSW assessed for safety, MOB denied SI, HI and domestic violence. MOB denied any current auditory or visual hallucinations. CSW inquired  about MOB's current emotional state, MOB reported that she was doing fine and ready to return home. MOB reported that she still had some holiday shopping to do.   CSW provided education regarding the baby blues period vs. perinatal mood disorders, discussed treatment and gave resources for mental health follow up if concerns arise.  CSW recommends self-evaluation during the postpartum time period using the New Mom Checklist from Postpartum Progress and encouraged MOB to contact a  medical professional if symptoms are noted at any time.    CSW provided review of Sudden Infant Death Syndrome (SIDS) precautions.    CSW identifies no further need for intervention and no barriers to discharge at this time.  CSW Plan/Description: No Further Intervention Required/No Barriers to Discharge, Sudden Infant Death Syndrome (SIDS) Education, Perinatal Mood and Anxiety Disorder (PMADs) Education    Burnis Medin, LCSW 02/02/2018, 12:40 PM

## 2018-02-02 NOTE — Discharge Summary (Addendum)
Postpartum Discharge Summary     Patient Name: Lisa GoodnightKatelyn C Barefield DOB: [birth year] MRN: [id number]  Date of admission: 01/31/2018 Delivering Provider: Rolm BookbinderNEILL, CAROLINE M   Date of discharge: 02/02/2018  Admitting diagnosis: 38 wks ctx Intrauterine pregnancy: 5662w0d     Secondary diagnosis:  Active Problems:   Hypothyroidism   S/P right oophorectomy   Anxiety   History of vaginal delivery following previous cesarean delivery   Normal labor  Additional problems: none     Discharge diagnosis: Term Pregnancy Delivered                                                                                                Post partum procedures:none  Augmentation: Pitocin  Complications: None  Hospital course:  Onset of Labor With Vaginal Delivery     32 y.o. yo [id number] at 7962w0d was admitted in Latent Labor on 01/31/2018. Patient had an uncomplicated labor course as follows:  Membrane Rupture Time/Date: 11:03 PM ,01/31/2018   Intrapartum Procedures: Episiotomy: None [1]                                         Lacerations:  2nd degree [3];Perineal [11]  Patient had a delivery of a Viable infant. 02/01/2018  Information for the patient's newborn:  Virgilio BellingUtter, Girl Pleasant [[id number]][[id number]]  Delivery Method: Vag-Spont    Pateint had an uncomplicated postpartum course.  She is ambulating, tolerating a regular diet, passing flatus, and urinating well. Patient is discharged home in stable condition on 02/02/18.   Magnesium Sulfate recieved: No BMZ received: No  Physical exam  Vitals:   02/01/18 1300 02/01/18 1713 02/01/18 2151 02/02/18 0534  BP: 116/70 100/65 115/73 109/74  Pulse: 77 85 91 69  Resp: 18 18  18   Temp: 98.1 F (36.7 C) 97.9 F (36.6 C) 97.6 F (36.4 C) (!) 97.5 F (36.4 C)  TempSrc: Oral Axillary Oral Oral  SpO2:   100%   Weight:      Height:       General: alert, sitting in bed breastfeeding infant Lochia: appropriate Uterine Fundus: firm Incision: N/A DVT Evaluation:  No evidence of DVT seen on physical exam. Labs: Lab Results  Component Value Date   WBC 19.6 (H) 02/01/2018   HGB 9.8 (L) 02/01/2018   HCT 31.0 (L) 02/01/2018   MCV 90.1 02/01/2018   PLT 268 02/01/2018   CMP Latest Ref Rng & Units 12/20/2017  Glucose 65 - 99 mg/dL 80  BUN 6 - 20 mg/dL 4(L)  Creatinine 8.410.57 - 1.00 mg/dL 3.24(M0.47(L)  Sodium 010134 - 272144 mmol/L 139  Potassium 3.5 - 5.2 mmol/L 4.1  Chloride 96 - 106 mmol/L 105  CO2 20 - 29 mmol/L 19(L)  Calcium 8.7 - 10.2 mg/dL 5.3(G8.4(L)  Total Protein 6.0 - 8.5 g/dL 6.4(Q5.8(L)  Total Bilirubin 0.0 - 1.2 mg/dL 0.4  Alkaline Phos 39 - 117 IU/L 246(H)  AST 0 - 40 IU/L 13  ALT 0 - 32 IU/L 9    Discharge instruction:  per After Visit Summary and "Baby and Me Booklet".  After visit meds:  Allergies as of 02/02/2018      Reactions   Imitrex [sumatriptan]    Makes migraines worse.    Maxalt [rizatriptan]    Makes migraines worse   Phenergan [promethazine Hcl] Nausea And Vomiting   Pill form; IV ok   Tape Other (See Comments)   Blisters on abdomen after C Section      Medication List    TAKE these medications   acetaminophen 325 MG tablet Commonly known as:  TYLENOL Take 650 mg by mouth every 6 (six) hours as needed for moderate pain or headache.   cyclobenzaprine 10 MG tablet Commonly known as:  FLEXERIL Take 1 tablet (10 mg total) by mouth 3 (three) times daily as needed for muscle spasms.   ibuprofen 600 MG tablet Commonly known as:  ADVIL,MOTRIN Take 1 tablet (600 mg total) by mouth every 6 (six) hours.   levothyroxine 150 MCG tablet Commonly known as:  SYNTHROID, LEVOTHROID Take 1 tablet (150 mcg total) by mouth daily before breakfast.   NEXIUM PO Take 1 tablet by mouth daily.   OB COMPLETE PETITE 35-5-1-200 MG Caps Take 1 capsule by mouth daily.   senna-docusate 8.6-50 MG tablet Commonly known as:  Senokot-S Take 2 tablets by mouth daily. Start taking on:  February 03, 2018   simethicone 80 MG chewable  tablet Commonly known as:  MYLICON Chew 1 tablet (80 mg total) by mouth as needed for flatulence.   traMADol 50 MG tablet Commonly known as:  ULTRAM Take 1 tablet (50 mg total) by mouth every 6 (six) hours as needed for severe pain.   TUMS PO Take by mouth 3 (three) times daily.       Diet: routine diet  Activity: Advance as tolerated. Pelvic rest for 6 weeks.   Outpatient follow up:4 weeks Follow up Appt:No future appointments. Follow up Visit: Follow-up Information    [facility].   Why:  Keep next OB visit on 12/26 Contact information: [address] [place] [phone]           Please schedule this patient for Postpartum visit in: 4 weeks with the following provider: Any provider For C/S patients schedule nurse incision check in weeks 2 weeks: no High risk pregnancy complicated by: hypothyroidism, history ICP Delivery mode:  SVD Anticipated Birth Control:  Plans Interval BTL PP Procedures needed: none  Schedule Integrated BH visit: no      Newborn Data: Live born female  Birth Weight: 8 lb 1.8 oz (3680 g) APGAR: 9, 9  Newborn Delivery   Birth date/time:  02/01/2018 01:38:00 Delivery type:  VBAC, Spontaneous     Baby Feeding: Breast Disposition:home with mother   02/02/2018 Henderson NewcomerBridgid H Wilson, MD  I confirm that I have verified the information documented in the resident's note and that I have also personally reperformed the physical exam and all medical decision making activities.  Rolm BookbinderCaroline M Neill, CNM 02/02/18

## 2018-02-05 ENCOUNTER — Other Ambulatory Visit: Payer: Self-pay

## 2018-02-05 ENCOUNTER — Ambulatory Visit (INDEPENDENT_AMBULATORY_CARE_PROVIDER_SITE_OTHER): Payer: Commercial Managed Care - PPO | Admitting: Advanced Practice Midwife

## 2018-02-05 ENCOUNTER — Encounter: Payer: Self-pay | Admitting: Advanced Practice Midwife

## 2018-02-05 ENCOUNTER — Encounter: Payer: Commercial Managed Care - PPO | Admitting: Obstetrics and Gynecology

## 2018-02-05 VITALS — BP 123/81 | HR 84 | Ht 62.0 in | Wt 287.0 lb

## 2018-02-05 DIAGNOSIS — L239 Allergic contact dermatitis, unspecified cause: Secondary | ICD-10-CM

## 2018-02-05 MED ORDER — CLOBETASOL PROPIONATE 0.05 % EX OINT: TOPICAL_OINTMENT | CUTANEOUS | 0 refills | Status: DC

## 2018-02-05 MED ORDER — PREDNISONE 20 MG PO TABS: 60.0000 mg | ORAL_TABLET | Freq: Every day | ORAL | 0 refills | Status: DC

## 2018-02-05 MED ORDER — MISOPROSTOL 200 MCG PO TABS: ORAL_TABLET | ORAL | 0 refills | Status: DC

## 2018-02-05 NOTE — Progress Notes (Signed)
[facility] Visit  Patient name: [name] MRN [id number]  Date of birth: Sep 20, 1985  CC & HPI:  [name] is a 32 y.o. Caucasian female presenting today for passing blood clots. Had uncomplicated VBAC on 12/23. Hgb after delivery was 9.8. Also, back has reacted to tape from epidural (see image uploaded).  Burns like crazy, not responding to hydrocortisone. Also has some dizziness, HA. Taking tylenol   Pertinent History Reviewed:  Medical & Surgical Hx:   Past Medical History:  Diagnosis Date  . Breast nodule 10/10/2014  . Breast pain 10/10/2014  . BV (bacterial vaginosis) 11/12/2012  . Depression   . Fatty liver   . Hypothyroid 02/14/2015  . Irregular menses 05/21/2013  . Migraines   . Obesity   . Other and unspecified ovarian cyst 05/31/2013   Right complex ?cystic vs solid mass on ovary will schedule appt with [facility]  . Pregnant 02/14/2015  . Thyroid disease    Past Surgical History:  Procedure Laterality Date  . CESAREAN SECTION    . COLONOSCOPY    . OOPHORECTOMY    . TONSILLECTOMY    . WISDOM TOOTH EXTRACTION     Family History  Problem Relation Age of Onset  . Diabetes Maternal Grandmother   . Hypertension Maternal Grandmother   . Cancer Maternal Grandmother        breast  . Diabetes Maternal Grandfather   . Hypertension Maternal Grandfather   . Leukemia Maternal Grandfather   . Diabetes Mother   . Hypertension Mother   . Heart attack Mother   . Stroke Mother   . Kidney disease Paternal Grandfather   . Seizures Father   . Other Father        back problems  . Dementia Paternal Grandmother   . Cancer Maternal Aunt   . Diabetes Maternal Uncle     Current Outpatient Medications:  .  acetaminophen (TYLENOL) 325 MG tablet, Take 650 mg by mouth every 6 (six) hours as needed for moderate pain or headache. , Disp: , Rfl:  .  Calcium Carbonate Antacid (TUMS PO), Take by mouth 3 (three) times daily., Disp: , Rfl:  .  ibuprofen (ADVIL,MOTRIN) 600 MG  tablet, Take 1 tablet (600 mg total) by mouth every 6 (six) hours., Disp: 30 tablet, Rfl: 0 .  levothyroxine (SYNTHROID, LEVOTHROID) 150 MCG tablet, Take 1 tablet (150 mcg total) by mouth daily before breakfast., Disp: 30 tablet, Rfl: 3 .  senna-docusate (SENOKOT-S) 8.6-50 MG tablet, Take 2 tablets by mouth daily., Disp: 60 tablet, Rfl: 0 .  simethicone (MYLICON) 80 MG chewable tablet, Chew 1 tablet (80 mg total) by mouth as needed for flatulence., Disp: 30 tablet, Rfl: 0 .  traMADol (ULTRAM) 50 MG tablet, Take 1 tablet (50 mg total) by mouth every 6 (six) hours as needed for severe pain., Disp: 15 tablet, Rfl: 0 .  clobetasol ointment (TEMOVATE) 0.05 %, Apply to affected area twice a day for 3-4 days, then once a day, Disp: 60 g, Rfl: 0 .  cyclobenzaprine (FLEXERIL) 10 MG tablet, Take 1 tablet (10 mg total) by mouth 3 (three) times daily as needed for muscle spasms. (Patient not taking: Reported on 02/05/2018), Disp: 30 tablet, Rfl: 2 .  Esomeprazole Magnesium (NEXIUM PO), Take 1 tablet by mouth daily. , Disp: , Rfl:  .  misoprostol (CYTOTEC) 200 MCG tablet, Daily for 3 days, Disp: 3 tablet, Rfl: 0 .  predniSONE (DELTASONE) 20 MG tablet, Take 3 tablets (60 mg  total) by mouth daily with breakfast., Disp: 30 tablet, Rfl: 0 .  Prenat-FeCbn-FeAspGl-FA-Omega (OB COMPLETE PETITE) 35-5-1-200 MG CAPS, Take 1 capsule by mouth daily. (Patient not taking: Reported on 02/05/2018), Disp: 30 capsule, Rfl: 12 Social History: Reviewed -  reports that she quit smoking about 7 years ago. Her smoking use included cigarettes. She has a 2.00 pack-year smoking history. She has never used smokeless tobacco.  Review of Systems:   Constitutional: Negative for fever and chills Eyes: Negative for visual disturbances Respiratory: Negative for shortness of breath, dyspnea Cardiovascular: Negative for chest pain or palpitations  Gastrointestinal: Negative for vomiting, diarrhea and constipation; no abdominal  pain Genitourinary: Negative for dysuria and urgency, vaginal irritation or itching Musculoskeletal: Negative for back pain, joint pain, myalgias  Neurological: Negative for dizziness and headaches    Objective Findings:    Physical Examination: Vitals:   02/05/18 1447  BP: 123/81  Pulse: 84   General appearance - well appearing, and in no distress Skin:  Rash w/blisters on back (see image) Mental status - alert, oriented to person, place, and time Chest:  Normal respiratory effort Heart - normal rate and regular rhythm Abdomen:  Soft, nontender Pelvic: small amount of blood in vagina  Musculoskeletal:  Normal range of motion without pain Extremities:  No edema    No results found for this or any previous visit (from the past 24 hour(s)).    Assessment & Plan:  A:   Allergic reaction to tape  Postpartum bleeding P:  Image shown to Dr. Erin FullingHarraway Smith, recommended prednisone 60mg  x10 days and clobetasol ointment.  Pt to f/u if deteriorates.    [facility]  Jacklyn ShellFrances Cresenzo-Dishmon CNM 02/05/2018 4:08 PM

## 2018-02-10 ENCOUNTER — Telehealth: Payer: Self-pay | Admitting: *Deleted

## 2018-02-10 NOTE — Telephone Encounter (Signed)
Pt had a vaginal delivery on 02/01/18. Pt's hgb today was 10.9. Pt is still taking prenatal vit daily. Pt is on last dose of prednisone due to reaction to epidural tape. Appetite not good. [name] instructed pt on iron rich foods and diet. Pt advised to follow up as needed. JSY

## 2018-03-09 ENCOUNTER — Ambulatory Visit: Payer: Commercial Managed Care - PPO | Admitting: Women's Health

## 2018-03-11 ENCOUNTER — Ambulatory Visit (INDEPENDENT_AMBULATORY_CARE_PROVIDER_SITE_OTHER): Payer: Medicaid Other | Admitting: Obstetrics and Gynecology

## 2018-03-11 ENCOUNTER — Telehealth: Payer: Self-pay | Admitting: *Deleted

## 2018-03-11 ENCOUNTER — Other Ambulatory Visit: Payer: Self-pay

## 2018-03-11 ENCOUNTER — Encounter: Payer: Self-pay | Admitting: Obstetrics and Gynecology

## 2018-03-11 VITALS — BP 113/79 | HR 69 | Ht 62.0 in | Wt 262.0 lb

## 2018-03-11 DIAGNOSIS — K439 Ventral hernia without obstruction or gangrene: Secondary | ICD-10-CM

## 2018-03-11 NOTE — Telephone Encounter (Signed)
Informed pt of appt for CT at [facility] on Friday 03/13/18. Advised to arrive at 315, nothing to eat or drink 4 hours prior to appt. Advised to go by [place] and pick up 2 bottles of oral prep. Pt verbalized understanding.

## 2018-03-11 NOTE — Progress Notes (Signed)
Patient ID: [name], female   DOB: 13-Dec-1985, 33 y.o.   MRN: [id number]  Subjective:    [name] is a 33 y.o. 453P3004 Caucasian female who presents for a postpartum visit. She is 5 weeks postpartum following a vaginal birth after cesarean (VBAC) at 3239 gestational weeks.  Had epidural tape reaction which caused severe reaction, but has since gone away.  Anesthesia: epidural. I have fully reviewed the prenatal and intrapartum course. Postpartum course has been good. Baby's course has been good. Baby is feeding by breast. Bleeding brown, pink and has not stopped bleeding with light pink/ brown bleeding since delivery and bleeding since gotten heavier. Bowel function is normal. Bladder function is normal. Patient is not sexually active. Is waiting until after tubal. Upper abdomen suspected ventral hernia in midline upper abdomen is still tender but not as bad as when she was pregnant. Around 10 cm area of rectus diastasis/ umbilical hernia. Contraception method is tubal ligation. Postpartum depression screening: negative. Score low.  Last pap 02/14/2015 and was normal.  The following portions of the patient's history were reviewed and updated as appropriate: allergies, current medications, past medical history, past surgical history and problem list.  Review of Systems Pertinent items are noted in HPI.   There were no vitals filed for this visit.  Objective:   General:  alert, cooperative and no distress   Breasts:  deferred, no complaints  Lungs: clear to auscultation bilaterally  Heart:  regular rate and rhythm  Abdomen: soft, with midline central fullness from umbilcus cephalad x 10 cm x 10 cm also has a 1.5 cm umbilical hernia, asymptomatic.   Vulva: normal  Vagina: normal vagina  Cervix:  closed  Corpus: Well-involuted  Adnexa:  Non-palpable  Rectal Exam: Not done        Assessment:  Abdominal rectus diastasis versus ventral hernia-Refer to Dr Henreitta LeberBridges. 1.5 cm umbilicall  hernia no protrusion felt on exam Postpartum exam 5 wks s/p VBAC breastfeeding Contraception counseling, tubal ligation desired   Plan:  : tubal ligation Follow up in: 2 weeks CT abdomen ordered. Consult with Dr Henreitta LeberBridges scheduled for Feb 6 2 pm, will attempt to coordinate schedules for tubal ligation and ventral hernia repair, if possible after assesmenet.   By signing my name below, I, Arnette NorrisMari Johnson, attest that this documentation has been prepared under the direction and in the presence of Tilda BurrowFerguson, Beatriz Settles V, MD. Electronically Signed: Arnette NorrisMari Johnson Medical Scribe. 03/11/18. 11:12 AM.  I personally performed the services described in this documentation, which was SCRIBED in my presence. The recorded information has been reviewed and considered accurate. It has been edited as necessary during review. Tilda BurrowJohn V Belen Zwahlen, MD

## 2018-03-13 ENCOUNTER — Telehealth: Payer: Self-pay | Admitting: Obstetrics and Gynecology

## 2018-03-13 ENCOUNTER — Ambulatory Visit (HOSPITAL_COMMUNITY): Payer: Medicaid Other

## 2018-03-13 NOTE — Telephone Encounter (Signed)
Pt was scheduled to have a CT SCan Preg mcd would not cover per the Hospital and pt had to cancel.

## 2018-03-13 NOTE — Telephone Encounter (Signed)
Update:  Spoke to Hospital and they are going to try and r/s for middle of Feb. Having issues getting precert with MCD:  Pt will keep [place] posted.

## 2018-03-19 ENCOUNTER — Ambulatory Visit: Payer: Medicaid Other | Admitting: General Surgery

## 2018-03-25 ENCOUNTER — Encounter: Payer: Self-pay | Admitting: Obstetrics and Gynecology

## 2018-03-25 ENCOUNTER — Ambulatory Visit: Payer: Medicaid Other | Admitting: Obstetrics and Gynecology

## 2018-03-25 ENCOUNTER — Telehealth: Payer: Self-pay | Admitting: Obstetrics and Gynecology

## 2018-03-25 VITALS — BP 111/83 | HR 90 | Ht 62.0 in | Wt 265.0 lb

## 2018-03-25 DIAGNOSIS — Z3009 Encounter for other general counseling and advice on contraception: Secondary | ICD-10-CM

## 2018-03-25 DIAGNOSIS — K436 Other and unspecified ventral hernia with obstruction, without gangrene: Secondary | ICD-10-CM

## 2018-03-25 NOTE — Progress Notes (Addendum)
Patient ID: [name], female   DOB: [birth year], 33 y.o.   MRN: [id number]  Subjective: Follow-up of scheduling of postpartum tubal ligation as well as ventral hernia    [name] returns after attempting to get CT of the abdomen last month to assess the suspected ventral hernia just above the umbilicus, and appointment with Dr. Henreitta Leber.  She is a 33 y.o. [id number] Caucasian female who presents for a postpartum visit. She is 7 weeks postpartum following a vaginal birth after cesarean (VBAC) at 85 gestational weeks. Anesthesia: epidural. I have fully reviewed the prenatal and intrapartum course. Postpartum course has been good. Baby's course has been good. Baby is feeding by breast. Bleeding no bleeding. Bowel function is normal. Bladder function is normal.Contraception method is tubal ligation.   The following portions of the patient's history were reviewed and updated as appropriate: allergies, current medications, past medical history, past surgical history and problem list.  Pregnancy medicaid would not cover CT for [facility] and was canceled. Will reschedule if possible.  Review of Systems Pertinent items are noted in HPI.   Vitals:   03/25/18 0930  BP: 111/83  Pulse: 90  Weight: 265 lb (120.2 kg)  Height: 5\' 2"  (1.575 m)    Objective:   General:  alert, cooperative and no distress   Breasts:  deferred, no complaints  Lungs: clear to auscultation bilaterally  Heart:  regular rate and rhythm  Abdomen: soft, ventral hernia   Vulva: Not examined  Vagina: Not examined  Cervix:  Not examined  Corpus: Not examined   Adnexa:  Not examined  Rectal Exam: Not examined        Assessment:   Postpartum exam 7 wks s/p VBAC Breastfeeding Ventral hernia  Plan:  Schedule CT of abdomen Follow up in: 2 weeks for preop or earlier if needed  By signing my name below, I, Arnette Norris, attest that this documentation has been prepared under the direction and in the presence of  Tilda Burrow, MD. Electronically Signed: Arnette Norris Medical Scribe. 03/25/18. 10:09 AM.  I personally performed the services described in this documentation, which was SCRIBED in my presence. The recorded information has been reviewed and considered accurate. It has been edited as necessary during review. Tilda Burrow, MD

## 2018-03-25 NOTE — Telephone Encounter (Signed)
Pt requests note stating that she may return to work. Advised that I would have one printed for her to pick up at her convenience. Pt verbalized understanding.

## 2018-03-25 NOTE — Telephone Encounter (Signed)
Patient called stating that she would like to know if Dr. Emelda Fear could write her a letter stating she could go back to work. Or if he would like to keep her out some more until her tubal surgery she would need a note as well. Please contact pt

## 2018-04-07 ENCOUNTER — Ambulatory Visit (HOSPITAL_COMMUNITY)
Admission: RE | Admit: 2018-04-07 | Discharge: 2018-04-07 | Disposition: A | Discharge status: Home / Self Care | Payer: Medicaid Other | Source: Ambulatory Visit | Attending: Obstetrics and Gynecology | Admitting: Obstetrics and Gynecology

## 2018-04-07 DIAGNOSIS — K439 Ventral hernia without obstruction or gangrene: Secondary | ICD-10-CM | POA: Insufficient documentation

## 2018-04-07 LAB — PREGNANCY, URINE: Preg Test, Ur: NEGATIVE

## 2018-04-07 MED ORDER — IOHEXOL 300 MG/ML  SOLN
100.0000 mL | Freq: Once | INTRAMUSCULAR | Status: AC | PRN
  Administered 2018-04-07: 100 mL via INTRAVENOUS

## 2018-04-08 NOTE — Telephone Encounter (Signed)
I called [name] and inform her that the CT does indeed show a large ventral hernia with transverse colon involved in the hernia.  She has an appointment Dr. Henreitta Leber tomorrow.  I have left a note for Dr. Henreitta Leber and we will coordinate schedules after she is seen beside her for the first time

## 2018-04-09 ENCOUNTER — Other Ambulatory Visit (INDEPENDENT_AMBULATORY_CARE_PROVIDER_SITE_OTHER): Payer: Self-pay | Admitting: *Deleted

## 2018-04-09 ENCOUNTER — Encounter (INDEPENDENT_AMBULATORY_CARE_PROVIDER_SITE_OTHER): Payer: Self-pay | Admitting: *Deleted

## 2018-04-09 ENCOUNTER — Ambulatory Visit (INDEPENDENT_AMBULATORY_CARE_PROVIDER_SITE_OTHER): Payer: Medicaid Other | Admitting: General Surgery

## 2018-04-09 ENCOUNTER — Encounter: Payer: Self-pay | Admitting: General Surgery

## 2018-04-09 VITALS — BP 127/85 | HR 88 | Temp 98.2°F | Resp 20 | Wt 259.8 lb

## 2018-04-09 DIAGNOSIS — K439 Ventral hernia without obstruction or gangrene: Secondary | ICD-10-CM | POA: Insufficient documentation

## 2018-04-09 DIAGNOSIS — K6389 Other specified diseases of intestine: Secondary | ICD-10-CM

## 2018-04-09 NOTE — Progress Notes (Signed)
[facility] History and Physical  Reason for Referral: Supraumbilical Hernia  Referring Physician:  Dr. Emelda Fear   Chief Complaint    Hernia      [name] is a 33 y.o. female.  HPI: [name] is a very pleasant 33 yo recently post partum female with findings of a ventral incision on physical exam and confirmation on CT. She says that she has felt some degree of a bulge in her abdomen since her twins were born 8 years ago, and that this area has become larger and more uncomfortable over time. The pain is most prominent when she is walking or bending over. She had a baby in December and while she was pregnant that area of the hernia was very tender. She also has been noted to have a large degree of diastasis and given this a CT was obtained to confirm that this was truly a hernia in addition to her diastasis.  She report no obstructive symptoms such as nausea/vomiting, and reports regular BMs. She does say that she has some pencil thin BMs on occasion but that they are not all like this in nature. She denies any blood in her stools.  The CT that was obtained did note a hernia with transverse colon but more concerning it also found thickening in the cecal area with concern for possible mass.   She denies any personal history of UC or Crohn's and no family history. She does have several people in her maternal family that had cancers including colon cancer, breast cancer, and ovarian cancer.    She would like to get the hernia repair at the same times a having her tubes tied.   Past Medical History:  Diagnosis Date  . Breast nodule 10/10/2014  . Breast pain 10/10/2014  . BV (bacterial vaginosis) 11/12/2012  . Depression   . Fatty liver   . Hypothyroid 02/14/2015  . Irregular menses 05/21/2013  . Migraines   . Obesity   . Other and unspecified ovarian cyst 05/31/2013   Right complex ?cystic vs solid mass on ovary will schedule appt with [facility]  . Pregnant 02/14/2015  . Thyroid  disease    Reported Hashimoto's Thyroiditis in past    Past Surgical History:  Procedure Laterality Date  . CESAREAN SECTION    . COLONOSCOPY    . OOPHORECTOMY    . TONSILLECTOMY    . WISDOM TOOTH EXTRACTION      Family History  Problem Relation Age of Onset  . Diabetes Maternal Grandmother   . Hypertension Maternal Grandmother   . Cancer Maternal Grandmother        breast  . Diabetes Maternal Grandfather   . Hypertension Maternal Grandfather   . Leukemia Maternal Grandfather   . Diabetes Mother   . Hypertension Mother   . Heart attack Mother   . Stroke Mother   . Kidney disease Paternal Grandfather   . Seizures Father   . Other Father        back problems  . Dementia Paternal Grandmother   . Cancer Maternal Aunt   . Diabetes Maternal Uncle   . Colon cancer Cousin   . Stomach cancer Other   . Ovarian cancer Cousin   . Ulcerative colitis Neg Hx   . Crohn's disease Neg Hx     Social History   Tobacco Use  . Smoking status: Former Smoker    Packs/day: 0.25    Years: 8.00    Pack years: 2.00  Types: Cigarettes    Last attempt to quit: 10/07/2010    Years since quitting: 7.5  . Smokeless tobacco: Never Used  Substance Use Topics  . Alcohol use: No    Comment: occ.; not now  . Drug use: No    Medications: I have reviewed the patient's current medications. Allergies as of 04/09/2018      Reactions   Imitrex [sumatriptan]    Makes migraines worse.    Maxalt [rizatriptan]    Makes migraines worse   Phenergan [promethazine Hcl] Nausea And Vomiting   Pill form; IV ok   Tape Other (See Comments)   Blisters on abdomen after C Section      Medication List       Accurate as of April 09, 2018 11:59 PM. Always use your most recent med list.        levothyroxine 150 MCG tablet Commonly known as:  SYNTHROID, LEVOTHROID Take 1 tablet (150 mcg total) by mouth daily before breakfast.   prenatal multivitamin Tabs tablet Take 1 tablet by mouth daily.          ROS:  A comprehensive review of systems was negative except for: Gastrointestinal: positive for abdominal pain Endocrine: positive for reported history of hashimotos  Blood pressure 127/85, pulse 88, temperature 98.2 F (36.8 C), temperature source Temporal, resp. rate 20, weight 259 lb 12.8 oz (117.8 kg), currently breastfeeding. Physical Exam Vitals signs reviewed.  Constitutional:      Appearance: Normal appearance.  HENT:     Head: Normocephalic and atraumatic.     Mouth/Throat:     Mouth: Mucous membranes are moist.  Eyes:     Extraocular Movements: Extraocular movements intact.     Pupils: Pupils are equal, round, and reactive to light.  Neck:     Musculoskeletal: Normal range of motion.  Cardiovascular:     Rate and Rhythm: Normal rate and regular rhythm.  Pulmonary:     Effort: Pulmonary effort is normal.     Breath sounds: Normal breath sounds.  Abdominal:     General: There is no distension.     Palpations: Abdomen is soft.     Tenderness: There is no abdominal tenderness.     Hernia: A hernia is present.  Musculoskeletal: Normal range of motion.        General: No swelling.  Skin:    General: Skin is warm.  Neurological:     General: No focal deficit present.     Mental Status: She is alert and oriented to person, place, and time.  Psychiatric:        Mood and Affect: Mood normal.        Behavior: Behavior normal.        Thought Content: Thought content normal.        Judgment: Judgment normal.     Results: CT a/p 04/07/18- Reviewed personally and with radiology Hernia supraumbilical with transverse colon, and thickened area in the cecum concerning for mass, ? Lymph nodes enlarged IMPRESSION: 1. Apparent mass measuring approximately 6 cm is identified within the ascending colon. This results in luminal narrowing. Can not rule out colonic neoplasm. Further evaluation with colonoscopy is advised. 2. Multiple prominent pericolonic lymph nodes are  identified within the right lower quadrant and adjacent serosal fat. 3. Large supraumbilical ventral abdominal wall hernia contains a nonobstructed loop of transverse colon.  Assessment & Plan:  [name] is a 33 y.o. female with a supraumbilical hernia that has likely gotten larger  over the course of her pregnancies with the incidental finding of thickening of the cecum concerning for a possible mass.  I reviewed this with radiology and they see focal thickening with folds, and this could be related to some specific colitis versus something like lymphoma. The patient also reports some pencil thin BMs on occasion but not consistently. Given these findings she needs a colonoscopy to determine what is going on prior to any hernia repair.  I have explained these findings to [name] and have explained our concern. She does have a significant family history for cancer, so this is concerning.   -Urgent referral to Dr. Karilyn Cota made, he will plan to do a colonoscopy 3/6, his help is much appreciated -Will see the patient back after Dr. Patty Sermons colonoscopy to discuss the plan. If  This is a cancer she will need surgery for removal of the colon.  Given the size of her hernia and discomfort, I wonder if an open primary repair would be better than post-poning her for a mesh repair as mesh could not be used at the same time as the colectomy. Will make decisions about hernia repair and best approach once we know what is going on in the colon.   All questions were answered to the satisfaction of the patient.  Have discussed the case with Dr. Emelda Fear and Dr. Karilyn Cota. We will coordinate care as needed.    Lucretia Roers 04/14/2018, 11:44 AM

## 2018-04-09 NOTE — Patient Instructions (Addendum)
Ventral Hernia  A ventral hernia is a bulge of tissue from inside the abdomen that pushes through a weak area of the muscles that form the front wall of the abdomen. The tissues inside the abdomen are inside a sac (peritoneum). These tissues include the small intestine, large intestine, and the fatty tissue that covers the intestines (omentum). Sometimes, the bulge that forms a hernia contains intestines. Other hernias contain only fat. Ventral hernias do not go away without surgical treatment. There are several types of ventral hernias. You may have:  A hernia at an incision site from previous abdominal surgery (incisional hernia).  A hernia just above the belly button (epigastric hernia), or at the belly button (umbilical hernia). These types of hernias can develop from heavy lifting or straining.  A hernia that comes and goes (reducible hernia). It may be visible only when you lift or strain. This type of hernia can be pushed back into the abdomen (reduced).  A hernia that traps abdominal tissue inside the hernia (incarcerated hernia). This type of hernia does not reduce.  A hernia that cuts off blood flow to the tissues inside the hernia (strangulated hernia). The tissues can start to die if this happens. This is a very painful bulge that cannot be reduced. A strangulated hernia is a medical emergency. What are the causes? This condition is caused by abdominal tissue putting pressure on an area of weakness in the abdominal muscles. What increases the risk? The following factors may make you more likely to develop this condition:  Being female.  Being 60 or older.  Being overweight or obese.  Having had previous abdominal surgery, especially if there was an infection after surgery.  Having had an injury to the abdominal wall.  Having had several pregnancies.  Having a buildup of fluid inside the abdomen (ascites). What are the signs or symptoms? The only symptom of a ventral hernia  may be a painless bulge in the abdomen. A reducible hernia may be visible only when you strain, cough, or lift. Other symptoms may include:  Dull pain.  A feeling of pressure. Signs and symptoms of a strangulated hernia may include:  Increasing pain.  Nausea and vomiting.  Pain when pressing on the hernia.  The skin over the hernia turning red or purple.  Constipation.  Blood in the stool (feces). How is this diagnosed? This condition may be diagnosed based on:  Your symptoms.  Your medical history.  A physical exam. You may be asked to cough or strain while standing. These actions increase the pressure inside your abdomen and force the hernia through the opening in your muscles. Your health care provider may try to reduce the hernia by pressing on it.  Imaging studies, such as an ultrasound or CT scan. How is this treated? This condition is treated with surgery. If you have a strangulated hernia, surgery is done as soon as possible. If your hernia is small and not incarcerated, you may be asked to lose some weight before surgery. Follow these instructions at home:  Follow instructions from your health care provider about eating or drinking restrictions.  If you are overweight, your health care provider may recommend that you increase your activity level and eat a healthier diet.  Do not lift anything that is heavier than 10 lb (4.5 kg).  Return to your normal activities as told by your health care provider. Ask your health care provider what activities are safe for you. You may need to avoid activities   that increase pressure on your hernia.  Take over-the-counter and prescription medicines only as told by your health care provider.  Keep all follow-up visits as told by your health care provider. This is important. Contact a health care provider if:  Your hernia gets larger.  Your hernia becomes painful. Get help right away if:  Your hernia becomes increasingly  painful.  You have pain along with any of the following: ? Changes in skin color in the area of the hernia. ? Nausea. ? Vomiting. ? Fever. Summary  A ventral hernia is a bulge of tissue from inside the abdomen that pushes through a weak area of the muscles that form the front wall of the abdomen.  This condition is treated with surgery, which may be urgent depending on your hernia.  Do not lift anything that is heavier than 10 lb (4.5 kg), and follow activity instructions from your health care provider. This information is not intended to replace advice given to you by your health care provider. Make sure you discuss any questions you have with your health care provider. Document Released: 01/15/2012 Document Revised: 03/12/2017 Document Reviewed: 08/19/2016 Elsevier Interactive Patient Education  2019 [employer].    Colon Mass, Adult  A colon mass is an abnormal growth in the large intestine (colon). A mass can cause a blockage (obstruction) or bleeding in the colon. What are the causes? This condition may be caused by:  Cancer.  Blood vessel problems.  Infection.  Inflammatory bowel disease (IBD).  An inflammation or infection of small pouches in the colon (diverticulitis).  A condition in which the lining of the uterus grows outside of the uterus (endometriosis). What are the signs or symptoms? Symptoms of this condition include:  Cramping.  Nausea.  Diarrhea.  Fever.  Vomiting.  Feeling weak.  Pain in the abdomen, side, or back.  Weight loss.  Constipation.  Bleeding from the rectum.  Loss of appetite.  Changes in bowel habits.  Feeling the need for a bowel movement after you have had one recently. In some cases, there are no symptoms. How is this diagnosed?     This condition may be diagnosed based on tests and procedures that are done to learn more about the mass. These may include:  Blood tests.  X-rays.  Ultrasound.  CT  scan.  MRI.  Colonoscopy. This is a procedure in which a lubricated, flexible tube is inserted into the anus and then passed into the rectum and colon to examine the areas.  Removal of a tissue sample from the mass to be examined (biopsy). A biopsy may be taken during a colonoscopy. In some cases, what seems like a colon mass may actually be something else, such as scarring that formed after a surgery or an ulcer. How is this treated? Treatment depends on the cause of the mass. You and your health care provider will discuss the meaning of your test results and the recommended steps for starting treatment. If your case is serious, you may need emergency surgery. Follow these instructions at home: What you need to do at home depends on the cause of the mass. Follow instructions from your health care provider. In general:  Take over-the-counter and prescription medicines only as told by your health care provider.  Keep all follow-up visits as told by your health care provider. This is important. Contact a health care provider if:  Your symptoms get worse.  You have new symptoms.  You have a fever.  Your pain  does not get better with medicine.  You feel weaker.  You bruise or bleed easily. Get help right away if:  You vomit bright red blood or material that looks like coffee grounds.  You have blood in your stools (feces), or your stools look black and tarry.  You faint.  You feel that the mass has suddenly gotten larger.  You have severe swelling or pressure in your abdomen (bloating). Summary  A colon mass is an abnormal growth in the large intestine (colon). There are many possible causes of a colon mass.  Treatment depends on the cause of the mass. If your case is serious, you may need emergency surgery.  Take over-the-counter and prescription medicines only as told by your health care provider. This information is not intended to replace advice given to you by your  health care provider. Make sure you discuss any questions you have with your health care provider. Document Released: 05/07/2006 Document Revised: 11/18/2016 Document Reviewed: 11/18/2016 Elsevier Interactive Patient Education  2019 [employer].   Colonoscopy, Adult A colonoscopy is an exam to look at the entire large intestine. During the exam, a lubricated, flexible tube that has a camera on the end of it is inserted into the anus and then passed into the rectum, colon, and other parts of the large intestine. You may have a colonoscopy as a part of normal colorectal screening or if you have certain symptoms, such as:  Lack of red blood cells (anemia).  Diarrhea that does not go away.  Abdominal pain.  Blood in your stool (feces). A colonoscopy can help screen for and diagnose medical problems, including:  Tumors.  Polyps.  Inflammation.  Areas of bleeding. Tell a health care provider about:  Any allergies you have.  All medicines you are taking, including vitamins, herbs, eye drops, creams, and over-the-counter medicines.  Any problems you or family members have had with anesthetic medicines.  Any blood disorders you have.  Any surgeries you have had.  Any medical conditions you have.  Any problems you have had passing stool. What are the risks? Generally, this is a safe procedure. However, problems may occur, including:  Bleeding.  A tear in the intestine.  A reaction to medicines given during the exam.  Infection (rare). What happens before the procedure? Eating and drinking restrictions Follow instructions from your health care provider about eating and drinking, which may include:  A few days before the procedure - follow a low-fiber diet. Avoid nuts, seeds, dried fruit, raw fruits, and vegetables.  1-3 days before the procedure - follow a clear liquid diet. Drink only clear liquids, such as clear broth or bouillon, black coffee or tea, clear juice,  clear soft drinks or sports drinks, gelatin dessert, and popsicles. Avoid any liquids that contain red or purple dye.  On the day of the procedure - do not eat or drink anything starting 2 hours before the procedure, or within the time period that your health care provider recommends. Up to 2 hours before the procedure, you may continue to drink clear liquids, such as water or clear fruit juice. Bowel prep If you were prescribed an oral bowel prep to clean out your colon:  Take it as told by your health care provider. Starting the day before your procedure, you will need to drink a large amount of medicated liquid. The liquid will cause you to have multiple loose stools until your stool is almost clear or light green.  If your skin or  anus gets irritated from diarrhea, you may use these to relieve the irritation: ? Medicated wipes, such as adult wet wipes with aloe and vitamin E. ? A skin-soothing product like petroleum jelly.  If you vomit while drinking the bowel prep, take a break for up to 60 minutes and then begin the bowel prep again. If vomiting continues and you cannot take the bowel prep without vomiting, call your health care provider.  To clean out your colon, you may also be given: ? Laxative medicines. ? Instructions about how to use an enema. General instructions  Ask your health care provider about: ? Changing or stopping your regular medicines or supplements. This is especially important if you are taking iron supplements, diabetes medicines, or blood thinners. ? Taking medicines such as aspirin and ibuprofen. These medicines can thin your blood. Do not take these medicines before the procedure if your health care provider tells you not to.  Plan to have someone take you home from the hospital or clinic. What happens during the procedure?   An IV may be inserted into one of your veins.  You will be given medicine to help you relax (sedative).  To reduce your risk of  infection: ? Your health care team will wash or sanitize their hands. ? Your anal area will be washed with soap.  You will be asked to lie on your side with your knees bent.  Your health care provider will lubricate a long, thin, flexible tube. The tube will have a camera and a light on the end.  The tube will be inserted into your anus.  The tube will be gently eased through your rectum and colon.  Air will be delivered into your colon to keep it open. You may feel some pressure or cramping.  The camera will be used to take images during the procedure.  A small tissue sample may be removed to be examined under a microscope (biopsy).  If small polyps are found, your health care provider may remove them and have them checked for cancer cells.  When the exam is done, the tube will be removed. The procedure may vary among health care providers and hospitals. What happens after the procedure?  Your blood pressure, heart rate, breathing rate, and blood oxygen level will be monitored until the medicines you were given have worn off.  Do not drive for 24 hours after the exam.  You may have a small amount of blood in your stool.  You may pass gas and have mild abdominal cramping or bloating due to the air that was used to inflate your colon during the exam.  It is up to you to get the results of your procedure. Ask your health care provider, or the department performing the procedure, when your results will be ready. Summary  A colonoscopy is an exam to look at the entire large intestine.  During a colonoscopy, a lubricated, flexible tube with a camera on the end of it is inserted into the anus and then passed into the colon and other parts of the large intestine.  Follow instructions from your health care provider about eating and drinking before the procedure.  If you were prescribed an oral bowel prep to clean out your colon, take it as told by your health care provider.  After  your procedure, your blood pressure, heart rate, breathing rate, and blood oxygen level will be monitored until the medicines you were given have worn off. This information is not  intended to replace advice given to you by your health care provider. Make sure you discuss any questions you have with your health care provider. Document Released: 01/26/2000 Document Revised: 11/20/2016 Document Reviewed: 04/11/2015 Elsevier Interactive Patient Education  2019 [employer]

## 2018-04-10 NOTE — Patient Instructions (Signed)
[name]  04/10/2018     @PREFPERIOPPHARMACY @   Your procedure is scheduled on  04/17/2018.  Report to [facility] at  920  A.M.  Call this number if you have problems the morning of surgery:  [phone]   Remember:  Follow the diet and prep instructions given to you by Dr Patty Sermons office.                    Take these medicines the morning of surgery with A SIP OF WATER  Levothyroxine.    Do not wear jewelry, make-up or nail polish.  Do not wear lotions, powders, or perfumes, or deodorant.  Do not shave 48 hours prior to surgery.  Men may shave face and neck.  Do not bring valuables to the hospital.  [facility] is not responsible for any belongings or valuables.  Contacts, dentures or bridgework may not be worn into surgery.  Leave your suitcase in the car.  After surgery it may be brought to your room.  For patients admitted to the hospital, discharge time will be determined by your treatment team.  Patients discharged the day of surgery will not be allowed to drive home.   Name and phone number of your driver:   family Special instructions:  None  Please read over the following fact sheets that you were given. Anesthesia Post-op Instructions and Care and Recovery After Surgery       Colonoscopy, Adult, Care After This sheet gives you information about how to care for yourself after your procedure. Your health care provider may also give you more specific instructions. If you have problems or questions, contact your health care provider. What can I expect after the procedure? After the procedure, it is common to have:  A small amount of blood in your stool for 24 hours after the procedure.  Some gas.  Mild abdominal cramping or bloating. Follow these instructions at home: General instructions  For the first 24 hours after the procedure: ? Do not drive or use machinery. ? Do not sign important documents. ? Do not drink alcohol. ? Do your  regular daily activities at a slower pace than normal. ? Eat soft, easy-to-digest foods.  Take over-the-counter or prescription medicines only as told by your health care provider. Relieving cramping and bloating   Try walking around when you have cramps or feel bloated.  Apply heat to your abdomen as told by your health care provider. Use a heat source that your health care provider recommends, such as a moist heat pack or a heating pad. ? Place a towel between your skin and the heat source. ? Leave the heat on for 20-30 minutes. ? Remove the heat if your skin turns bright red. This is especially important if you are unable to feel pain, heat, or cold. You may have a greater risk of getting burned. Eating and drinking   Drink enough fluid to keep your urine pale yellow.  Resume your normal diet as instructed by your health care provider. Avoid heavy or fried foods that are hard to digest.  Avoid drinking alcohol for as long as instructed by your health care provider. Contact a health care provider if:  You have blood in your stool 2-3 days after the procedure. Get help right away if:  You have more than a small spotting of blood in your stool.  You pass large blood clots in your stool.  Your abdomen  is swollen.  You have nausea or vomiting.  You have a fever.  You have increasing abdominal pain that is not relieved with medicine. Summary  After the procedure, it is common to have a small amount of blood in your stool. You may also have mild abdominal cramping and bloating.  For the first 24 hours after the procedure, do not drive or use machinery, sign important documents, or drink alcohol.  Contact your health care provider if you have a lot of blood in your stool, nausea or vomiting, a fever, or increased abdominal pain. This information is not intended to replace advice given to you by your health care provider. Make sure you discuss any questions you have with your  health care provider. Document Released: 09/12/2003 Document Revised: 11/20/2016 Document Reviewed: 04/11/2015 Elsevier Interactive Patient Education  2019 [employer] Monitored Anesthesia Care, Care After These instructions provide you with information about caring for yourself after your procedure. Your health care provider may also give you more specific instructions. Your treatment has been planned according to current medical practices, but problems sometimes occur. Call your health care provider if you have any problems or questions after your procedure. What can I expect after the procedure? After your procedure, you may:  Feel sleepy for several hours.  Feel clumsy and have poor balance for several hours.  Feel forgetful about what happened after the procedure.  Have poor judgment for several hours.  Feel nauseous or vomit.  Have a sore throat if you had a breathing tube during the procedure. Follow these instructions at home: For at least 24 hours after the procedure:      Have a responsible adult stay with you. It is important to have someone help care for you until you are awake and alert.  Rest as needed.  Do not: ? Participate in activities in which you could fall or become injured. ? Drive. ? Use heavy machinery. ? Drink alcohol. ? Take sleeping pills or medicines that cause drowsiness. ? Make important decisions or sign legal documents. ? Take care of children on your own. Eating and drinking  Follow the diet that is recommended by your health care provider.  If you vomit, drink water, juice, or soup when you can drink without vomiting.  Make sure you have little or no nausea before eating solid foods. General instructions  Take over-the-counter and prescription medicines only as told by your health care provider.  If you have sleep apnea, surgery and certain medicines can increase your risk for breathing problems. Follow instructions from your health  care provider about wearing your sleep device: ? Anytime you are sleeping, including during daytime naps. ? While taking prescription pain medicines, sleeping medicines, or medicines that make you drowsy.  If you smoke, do not smoke without supervision.  Keep all follow-up visits as told by your health care provider. This is important. Contact a health care provider if:  You keep feeling nauseous or you keep vomiting.  You feel light-headed.  You develop a rash.  You have a fever. Get help right away if:  You have trouble breathing. Summary  For several hours after your procedure, you may feel sleepy and have poor judgment.  Have a responsible adult stay with you for at least 24 hours or until you are awake and alert. This information is not intended to replace advice given to you by your health care provider. Make sure you discuss any questions you have with your health care provider.  Document Released: 05/21/2015 Document Revised: 09/13/2016 Document Reviewed: 05/21/2015 Elsevier Interactive Patient Education  2019 [employer].

## 2018-04-14 ENCOUNTER — Encounter: Payer: Self-pay | Admitting: General Surgery

## 2018-04-14 ENCOUNTER — Encounter (HOSPITAL_COMMUNITY)
Admission: RE | Admit: 2018-04-14 | Discharge: 2018-04-14 | Disposition: A | Discharge status: Home / Self Care | Payer: Medicaid Other | Source: Ambulatory Visit | Attending: Internal Medicine | Admitting: Internal Medicine

## 2018-04-14 DIAGNOSIS — Z01812 Encounter for preprocedural laboratory examination: Secondary | ICD-10-CM | POA: Insufficient documentation

## 2018-04-14 DIAGNOSIS — K6389 Other specified diseases of intestine: Secondary | ICD-10-CM | POA: Insufficient documentation

## 2018-04-14 LAB — CBC
HCT: 41.5 % (ref 36.0–46.0)
Hemoglobin: 12.5 g/dL (ref 12.0–15.0)
MCH: 27.1 pg (ref 26.0–34.0)
MCHC: 30.1 g/dL (ref 30.0–36.0)
MCV: 90 fL (ref 80.0–100.0)
Platelets: 284 10*3/uL (ref 150–400)
RBC: 4.61 MIL/uL (ref 3.87–5.11)
RDW: 13.8 % (ref 11.5–15.5)
WBC: 6.6 10*3/uL (ref 4.0–10.5)
nRBC: 0 % (ref 0.0–0.2)

## 2018-04-14 LAB — COMPREHENSIVE METABOLIC PANEL
ALT: 171 U/L — ABNORMAL HIGH (ref 0–44)
AST: 131 U/L — ABNORMAL HIGH (ref 15–41)
Albumin: 3.7 g/dL (ref 3.5–5.0)
Alkaline Phosphatase: 574 U/L — ABNORMAL HIGH (ref 38–126)
Anion gap: 9 (ref 5–15)
BUN: 11 mg/dL (ref 6–20)
CO2: 22 mmol/L (ref 22–32)
Calcium: 8.8 mg/dL — ABNORMAL LOW (ref 8.9–10.3)
Chloride: 108 mmol/L (ref 98–111)
Creatinine, Ser: 0.45 mg/dL (ref 0.44–1.00)
GFR calc non Af Amer: >60 mL/min (ref >60)
Glucose, Bld: 90 mg/dL (ref 70–99)
Potassium: 3.5 mmol/L (ref 3.5–5.1)
SODIUM: 139 mmol/L (ref 135–145)
Total Bilirubin: 0.6 mg/dL (ref 0.3–1.2)
Total Protein: 7 g/dL (ref 6.5–8.1)

## 2018-04-14 LAB — HCG, SERUM, QUALITATIVE: PREG SERUM: NEGATIVE

## 2018-04-17 ENCOUNTER — Encounter (HOSPITAL_COMMUNITY): Payer: Self-pay

## 2018-04-17 ENCOUNTER — Ambulatory Visit (HOSPITAL_COMMUNITY)
Admission: RE | Admit: 2018-04-17 | Discharge: 2018-04-17 | Disposition: A | Discharge status: Home / Self Care | Payer: Medicaid Other | Attending: Internal Medicine | Admitting: Internal Medicine

## 2018-04-17 ENCOUNTER — Ambulatory Visit (HOSPITAL_COMMUNITY): Payer: Medicaid Other | Admitting: Anesthesiology

## 2018-04-17 ENCOUNTER — Encounter (HOSPITAL_COMMUNITY)
Admission: RE | Disposition: A | Discharge status: Home / Self Care | Payer: Self-pay | Source: Home / Self Care | Attending: Internal Medicine

## 2018-04-17 ENCOUNTER — Other Ambulatory Visit (INDEPENDENT_AMBULATORY_CARE_PROVIDER_SITE_OTHER): Payer: Self-pay | Admitting: Internal Medicine

## 2018-04-17 ENCOUNTER — Other Ambulatory Visit: Payer: Self-pay

## 2018-04-17 DIAGNOSIS — Z888 Allergy status to other drugs, medicaments and biological substances status: Secondary | ICD-10-CM | POA: Insufficient documentation

## 2018-04-17 DIAGNOSIS — R7989 Other specified abnormal findings of blood chemistry: Secondary | ICD-10-CM

## 2018-04-17 DIAGNOSIS — F329 Major depressive disorder, single episode, unspecified: Secondary | ICD-10-CM | POA: Diagnosis not present

## 2018-04-17 DIAGNOSIS — R740 Nonspecific elevation of levels of transaminase and lactic acid dehydrogenase [LDH]: Secondary | ICD-10-CM | POA: Diagnosis not present

## 2018-04-17 DIAGNOSIS — Z87891 Personal history of nicotine dependence: Secondary | ICD-10-CM | POA: Insufficient documentation

## 2018-04-17 DIAGNOSIS — E669 Obesity, unspecified: Secondary | ICD-10-CM | POA: Diagnosis not present

## 2018-04-17 DIAGNOSIS — Z79899 Other long term (current) drug therapy: Secondary | ICD-10-CM | POA: Insufficient documentation

## 2018-04-17 DIAGNOSIS — K6389 Other specified diseases of intestine: Secondary | ICD-10-CM

## 2018-04-17 DIAGNOSIS — R945 Abnormal results of liver function studies: Secondary | ICD-10-CM

## 2018-04-17 DIAGNOSIS — Z800 Family history of malignant neoplasm of digestive organs: Secondary | ICD-10-CM | POA: Insufficient documentation

## 2018-04-17 DIAGNOSIS — K439 Ventral hernia without obstruction or gangrene: Secondary | ICD-10-CM | POA: Diagnosis not present

## 2018-04-17 DIAGNOSIS — K644 Residual hemorrhoidal skin tags: Secondary | ICD-10-CM | POA: Insufficient documentation

## 2018-04-17 DIAGNOSIS — E039 Hypothyroidism, unspecified: Secondary | ICD-10-CM | POA: Diagnosis not present

## 2018-04-17 DIAGNOSIS — R933 Abnormal findings on diagnostic imaging of other parts of digestive tract: Secondary | ICD-10-CM | POA: Insufficient documentation

## 2018-04-17 DIAGNOSIS — Z79890 Hormone replacement therapy (postmenopausal): Secondary | ICD-10-CM | POA: Diagnosis not present

## 2018-04-17 HISTORY — PX: COLONOSCOPY WITH PROPOFOL: SHX5780

## 2018-04-17 HISTORY — PX: BIOPSY: SHX5522

## 2018-04-17 LAB — HEPATIC FUNCTION PANEL
ALT: 130 U/L — ABNORMAL HIGH (ref 0–44)
AST: 68 U/L — ABNORMAL HIGH (ref 15–41)
Albumin: 3.8 g/dL (ref 3.5–5.0)
Alkaline Phosphatase: 539 U/L — ABNORMAL HIGH (ref 38–126)
Bilirubin, Direct: 0.2 mg/dL (ref 0.0–0.2)
Indirect Bilirubin: 0.6 mg/dL (ref 0.3–0.9)
TOTAL PROTEIN: 7.1 g/dL (ref 6.5–8.1)
Total Bilirubin: 0.8 mg/dL (ref 0.3–1.2)

## 2018-04-17 SURGERY — COLONOSCOPY WITH PROPOFOL
Anesthesia: General

## 2018-04-17 MED ORDER — CHLORHEXIDINE GLUCONATE CLOTH 2 % EX PADS: 6.0000 | MEDICATED_PAD | Freq: Once | CUTANEOUS | Status: DC

## 2018-04-17 MED ORDER — LACTATED RINGERS IV SOLN
INTRAVENOUS | Status: DC | PRN
  Administered 2018-04-17: 07:00:00 via INTRAVENOUS

## 2018-04-17 MED ORDER — LACTATED RINGERS IV SOLN: INTRAVENOUS | Status: DC

## 2018-04-17 MED ORDER — MIDAZOLAM HCL 2 MG/2ML IJ SOLN
0.5000 mg | Freq: Once | INTRAMUSCULAR | Status: DC | PRN
  Filled 2018-04-17: qty 2

## 2018-04-17 MED ORDER — PROPOFOL 10 MG/ML IV BOLUS
INTRAVENOUS | Status: AC
  Filled 2018-04-17: qty 80

## 2018-04-17 MED ORDER — HYDROMORPHONE HCL 1 MG/ML IJ SOLN: 0.2500 mg | INTRAMUSCULAR | Status: DC | PRN

## 2018-04-17 MED ORDER — PROPOFOL 500 MG/50ML IV EMUL
INTRAVENOUS | Status: DC | PRN
  Administered 2018-04-17: 08:00:00 via INTRAVENOUS
  Administered 2018-04-17: 150 ug/kg/min via INTRAVENOUS

## 2018-04-17 MED ORDER — PROPOFOL 10 MG/ML IV BOLUS
INTRAVENOUS | Status: DC | PRN
  Administered 2018-04-17: 30 mg via INTRAVENOUS
  Administered 2018-04-17: 20 mg via INTRAVENOUS

## 2018-04-17 MED ORDER — HYDROCODONE-ACETAMINOPHEN 7.5-325 MG PO TABS: 1.0000 | ORAL_TABLET | Freq: Once | ORAL | Status: DC | PRN

## 2018-04-17 MED ORDER — PHENYLEPHRINE 40 MCG/ML (10ML) SYRINGE FOR IV PUSH (FOR BLOOD PRESSURE SUPPORT)
PREFILLED_SYRINGE | INTRAVENOUS | Status: AC
  Filled 2018-04-17: qty 10

## 2018-04-17 NOTE — Transfer of Care (Signed)
Immediate Anesthesia Transfer of Care Note  Patient: [name]  Procedure(s) Performed: COLONOSCOPY WITH PROPOFOL (N/A ) BIOPSY  Patient Location: PACU  Anesthesia Type:General  Level of Consciousness: awake, alert  and oriented  Airway & Oxygen Therapy: Patient Spontanous Breathing  Post-op Assessment: Report given to RN and Post -op Vital signs reviewed and stable  Post vital signs: Reviewed and stable  Last Vitals:  Vitals Value Taken Time  BP 104/76 04/17/2018  8:15 AM  Temp    Pulse 114 04/17/2018  8:16 AM  Resp 20 04/17/2018  8:16 AM  SpO2 100 % 04/17/2018  8:16 AM  Vitals shown include unvalidated device data.  Last Pain:  Vitals:   04/17/18 0733  TempSrc:   PainSc: 0-No pain         Complications: No apparent anesthesia complications

## 2018-04-17 NOTE — H&P (Signed)
[name] is an 33 y.o. female.   Chief Complaint: Patient is here for colonoscopy. HPI: Patient is 33 year old Caucasian female who was recently evaluated by Dr. Larae Wiley for left-sided abdominal pain.  She underwent CT which revealed ventral supraumbilical hernia but was also revealed abnormality in a sending colon cecum suspicious for a mass.  Patient states pain started on the left side during her pregnancy.  Pain now is infrequent and occurs if she is up on her feet for long.  She denies right-sided abdominal pain.  She also denies rectal bleeding or change in her bowel habits.  She says she had colonoscopy about 6 years ago by Dr. Teena Wiley for diarrhea and was normal. Preop lab studies revealed elevated transaminases.  Patient states she has had elevated transaminases off and on for 8 years. Family history is positive for colon carcinoma in 2 maternal aunts.  One was in her 70s another in her 64s and they both died because of it. Patient takes ibuprofen infrequently for headache etc.  She rarely takes more than a gram on a given day.  Past Medical History:  Diagnosis Date  . Breast nodule 10/10/2014  . Breast pain 10/10/2014  . BV (bacterial vaginosis) 11/12/2012  . Depression   . Fatty liver   . Hypothyroid 02/14/2015  . Irregular menses 05/21/2013  . Migraines   . Obesity   . Other and unspecified ovarian cyst 05/31/2013   Right complex ?cystic vs solid mass on ovary will schedule appt with [facility]  . Pregnant 02/14/2015  . Thyroid disease    Reported Hashimoto's Thyroiditis in past    Past Surgical History:  Procedure Laterality Date  . CESAREAN SECTION    . COLONOSCOPY    . OOPHORECTOMY    . TONSILLECTOMY    . WISDOM TOOTH EXTRACTION      Family History  Problem Relation Age of Onset  . Diabetes Maternal Grandmother   . Hypertension Maternal Grandmother   . Cancer Maternal Grandmother        breast  . Diabetes Maternal Grandfather   . Hypertension Maternal Grandfather    . Leukemia Maternal Grandfather   . Diabetes Mother   . Hypertension Mother   . Heart attack Mother   . Stroke Mother   . Kidney disease Paternal Grandfather   . Seizures Father   . Other Father        back problems  . Dementia Paternal Grandmother   . Cancer Maternal Aunt   . Diabetes Maternal Uncle   . Colon cancer Cousin   . Stomach cancer Other   . Ovarian cancer Cousin   . Ulcerative colitis Neg Hx   . Crohn's disease Neg Hx    Social History:  reports that she quit smoking about 7 years ago. Her smoking use included cigarettes. She has a 2.00 pack-year smoking history. She has never used smokeless tobacco. She reports that she does not drink alcohol or use drugs.  Allergies:  Allergies  Allergen Reactions  . Imitrex [Sumatriptan]     Makes migraines worse.   . Maxalt [Rizatriptan]     Makes migraines worse  . Phenergan [Promethazine Hcl] Nausea And Vomiting    Pill form; IV ok  . Tape Other (See Comments)    Blisters on abdomen after C Section    Medications Prior to Admission  Medication Sig Dispense Refill  . levothyroxine (SYNTHROID, LEVOTHROID) 150 MCG tablet Take 1 tablet (150 mcg total) by mouth daily before breakfast.  30 tablet 3  . Prenatal Vit-Fe Fumarate-FA (PRENATAL MULTIVITAMIN) TABS tablet Take 1 tablet by mouth daily.      No results found for this or any previous visit (from the past 48 hour(s)). No results found.  ROS  Blood pressure 114/72, pulse 86, temperature 98.2 F (36.8 C), temperature source Oral, resp. rate 14, SpO2 99 %, currently breastfeeding. Physical Exam  Constitutional: She appears well-developed and well-nourished.  HENT:  Mouth/Throat: Oropharynx is clear and moist.  Eyes: Conjunctivae are normal. No scleral icterus.  Neck: No thyromegaly present.  Cardiovascular: Normal rate, regular rhythm and normal heart sounds.  No murmur heard. Respiratory: Effort normal and breath sounds normal.  GI:  Abdomen is full.  She has  scar across lower abdomen.  On palpation abdomen is soft and nontender.  Ventral hernia in upper abdomen is completely reduced in supine position.  Musculoskeletal:        General: No edema.  Lymphadenopathy:    She has no cervical adenopathy.  Neurological: She is alert.  Skin: Skin is warm and dry.     Assessment/Plan Abnormal abdominal pelvic CT revealing mass in a sending colon/cecum. Diagnostic colonoscopy.  Lionel December, MD 04/17/2018, 7:19 AM

## 2018-04-17 NOTE — Progress Notes (Signed)
Advised patient to pump breast milk and discard at least three times due to anesthetic.  Verbalized understanding.Marland Kitchen

## 2018-04-17 NOTE — Anesthesia Preprocedure Evaluation (Signed)
Anesthesia Evaluation  Patient identified by MRN, date of birth, ID band Patient awake    Reviewed: Allergy & Precautions, NPO status , Patient's Chart, lab work & pertinent test results  Airway Mallampati: III  TM Distance: >3 FB Neck ROM: Full    Dental no notable dental hx. (+) Teeth Intact   Pulmonary neg pulmonary ROS, former smoker,    Pulmonary exam normal breath sounds clear to auscultation       Cardiovascular Exercise Tolerance: Good negative cardio ROS Normal cardiovascular examI Rhythm:Regular Rate:Normal     Neuro/Psych  Headaches, negative psych ROS   GI/Hepatic negative GI ROS, Neg liver ROS, Reports chronic increased LFTs of unknown etiology    Endo/Other  Hypothyroidism Morbid obesity  Renal/GU negative Renal ROS  negative genitourinary   Musculoskeletal negative musculoskeletal ROS (+)   Abdominal   Peds negative pediatric ROS (+)  Hematology negative hematology ROS (+)   Anesthesia Other Findings   Reproductive/Obstetrics (+) Breast feeding                              Anesthesia Physical Anesthesia Plan  ASA: III  Anesthesia Plan: General   Post-op Pain Management:    Induction: Intravenous  PONV Risk Score and Plan:   Airway Management Planned: Nasal Cannula and Simple Face Mask  Additional Equipment:   Intra-op Plan:   Post-operative Plan: Extubation in OR  Informed Consent: I have reviewed the patients History and Physical, chart, labs and discussed the procedure including the risks, benefits and alternatives for the proposed anesthesia with the patient or authorized representative who has indicated his/her understanding and acceptance.     Dental advisory given  Plan Discussed with: CRNA  Anesthesia Plan Comments: (No narcotic -breastfeeding -will try only propofol )        Anesthesia Quick Evaluation

## 2018-04-17 NOTE — Op Note (Signed)
[facility] Patient Name: [name] Procedure Date: 04/17/2018 7:19 AM MRN: [id number] Date of Birth: Oct 19, 1985 Attending MD: Lionel December , MD CSN: [id number] Age: 33 Admit Type: Outpatient Procedure:                Colonoscopy Indications:              Abnormal CT of the GI tract Providers:                Lionel December, MD, Loma Messing B. Patsy Lager, RN, Burke Keels, Technician, Pandora Leiter, Technician Referring MD:             Algis Greenhouse, MD and Donzetta Sprung, MD Medicines:                Propofol per Anesthesia Complications:            No immediate complications. Estimated Blood Loss:     Estimated blood loss was minimal. Estimated blood                            loss was minimal. Procedure:                Pre-Anesthesia Assessment:                           - Prior to the procedure, a History and Physical                            was performed, and patient medications and                            allergies were reviewed. The patient's tolerance of                            previous anesthesia was also reviewed. The risks                            and benefits of the procedure and the sedation                            options and risks were discussed with the patient.                            All questions were answered, and informed consent                            was obtained. Prior Anticoagulants: The patient has                            taken no previous anticoagulant or antiplatelet                            agents. ASA Grade Assessment: II - A patient with  mild systemic disease. After reviewing the risks                            and benefits, the patient was deemed in                            satisfactory condition to undergo the procedure.                           After obtaining informed consent, the colonoscope                            was passed under direct vision. Throughout the                             procedure, the patient's blood pressure, pulse, and                            oxygen saturations were monitored continuously. The                            [id number]-[id number] ([id number]) scope was introduced through                            the and advanced to the the terminal ileum, with                            identification of the appendiceal orifice and IC                            valve. The colonoscopy was technically difficult                            and complex due to ventral hernia. Successful                            completion of the procedure was aided by changing                            the patient to a supine position and using manual                            pressure. The patient tolerated the procedure well.                            The quality of the bowel preparation was excellent.                            The terminal ileum, ileocecal valve, appendiceal                            orifice, and rectum were photographed. Scope In: 7:39:20 AM Scope Out: 8:04:21 AM Scope Withdrawal Time: 0 hours 10 minutes 36 seconds  Total Procedure Duration: 0 hours 25 minutes 1 second  Findings:      The perianal and digital rectal examinations were normal.      The terminal ileum appeared normal.      An area of mildly congested mucosa was found in the ascending colon and       in the cecum. This was biopsied with a cold forceps for histology. The       pathology specimen was placed into Bottle Number 1.      The exam was otherwise normal throughout the examined colon.      External hemorrhoids were found during retroflexion. The hemorrhoids       were small. Impression:               - The examined portion of the ileum was normal.                           - Congested mucosa in the ascending colon and in                            the cecum. Biopsied.                           - External hemorrhoids. Moderate Sedation:      Per Anesthesia  Care Recommendation:           - Patient has a contact number available for                            emergencies. The signs and symptoms of potential                            delayed complications were discussed with the                            patient. Return to normal activities tomorrow.                            Written discharge instructions were provided to the                            patient.                           - Resume previous diet today.                           - Continue present medications.                           - No aspirin, ibuprofen, naproxen, or other                            non-steroidal anti-inflammatory drugs for 1 day.                           - Await pathology results.                           -  Repeat colonoscopy at age 73 for screening                            purposes. Procedure Code(s):        --- Professional ---                           [phone], Colonoscopy, flexible; with biopsy, single                            or multiple Diagnosis Code(s):        --- Professional ---                           K64.4, Residual hemorrhoidal skin tags                           K63.89, Other specified diseases of intestine                           R93.3, Abnormal findings on diagnostic imaging of                            other parts of digestive tract CPT copyright 2018 American Medical Association. All rights reserved. The codes documented in this report are preliminary and upon coder review may  be revised to meet current compliance requirements. Lionel December, MD Lionel December, MD 04/17/2018 8:13:39 AM This report has been signed electronically. Number of Addenda: 0

## 2018-04-17 NOTE — Anesthesia Postprocedure Evaluation (Signed)
Anesthesia Post Note  Patient: [name]  Procedure(s) Performed: COLONOSCOPY WITH PROPOFOL (N/A ) BIOPSY  Patient location during evaluation: PACU Anesthesia Type: General Level of consciousness: awake and alert and oriented Pain management: pain level controlled Vital Signs Assessment: post-procedure vital signs reviewed and stable Respiratory status: spontaneous breathing Cardiovascular status: stable Postop Assessment: no apparent nausea or vomiting Anesthetic complications: no Comments: Patient had a panic attack on arrival to pacu; dr. Lemont Fillers aware; reassurances given; Versed offered to patient but she declined.  States that this is how she responds to anesthesia.  Patient is more relaxed and resting comfortably at this time.     Last Vitals:  Vitals:   04/17/18 0653  BP: 114/72  Pulse: 86  Resp: 14  Temp: 36.8 C  SpO2: 99%    Last Pain:  Vitals:   04/17/18 0733  TempSrc:   PainSc: 0-No pain                 [name]

## 2018-04-17 NOTE — Progress Notes (Signed)
Patient had panic attack upon entering recovery room. Dr,. Nabonsal asked patient if she needed anything for it and patient stated that it was normal for her to wake up this way. Patient instructed to do deep breathing and she complied with instructions and was able to calm down.

## 2018-04-17 NOTE — Discharge Instructions (Signed)
No aspirin or NSAIDs for 24 hours. Resume usual medications and diet as before. No driving for 24 hours. Physician will call with results of biopsy and blood test. PUMP AND DISCARD BREAST MILK AT LEAST 3 TIMES DUE TO ANESTHESIA  PATIENT INSTRUCTIONS POST-ANESTHESIA  IMMEDIATELY FOLLOWING SURGERY:  Do not drive or operate machinery for the first twenty four hours after surgery.  Do not make any important decisions for twenty four hours after surgery or while taking narcotic pain medications or sedatives.  If you develop intractable nausea and vomiting or a severe headache please notify your doctor immediately.  FOLLOW-UP:  Please make an appointment with your surgeon as instructed. You do not need to follow up with anesthesia unless specifically instructed to do so.  WOUND CARE INSTRUCTIONS (if applicable):  Keep a dry clean dressing on the anesthesia/puncture wound site if there is drainage.  Once the wound has quit draining you may leave it open to air.  Generally you should leave the bandage intact for twenty four hours unless there is drainage.  If the epidural site drains for more than 36-48 hours please call the anesthesia department.  QUESTIONS?:  Please feel free to call your physician or the hospital operator if you have any questions, and they will be happy to assist you.      Colonoscopy, Adult, Care After This sheet gives you information about how to care for yourself after your procedure. Your doctor may also give you more specific instructions. If you have problems or questions, call your doctor. What can I expect after the procedure? After the procedure, it is common to have:  A small amount of blood in your poop for 24 hours.  Some gas.  Mild cramping or bloating in your belly. Follow these instructions at home: General instructions  For the first 24 hours after the procedure: ? Do not drive or use machinery. ? Do not sign important documents. ? Do not drink  alcohol. ? Do your daily activities more slowly than normal. ? Eat foods that are soft and easy to digest.  Take over-the-counter or prescription medicines only as told by your doctor. To help cramping and bloating:   Try walking around.  Put heat on your belly (abdomen) as told by your doctor. Use a heat source that your doctor recommends, such as a moist heat pack or a heating pad. ? Put a towel between your skin and the heat source. ? Leave the heat on for 20-30 minutes. ? Remove the heat if your skin turns bright red. This is especially important if you cannot feel pain, heat, or cold. You can get burned. Eating and drinking   Drink enough fluid to keep your pee (urine) clear or pale yellow.  Return to your normal diet as told by your doctor. Avoid heavy or fried foods that are hard to digest.  Avoid drinking alcohol for as long as told by your doctor. Contact a doctor if:  You have blood in your poop (stool) 2-3 days after the procedure. Get help right away if:  You have more than a small amount of blood in your poop.  You see large clumps of tissue (blood clots) in your poop.  Your belly is swollen.  You feel sick to your stomach (nauseous).  You throw up (vomit).  You have a fever.  You have belly pain that gets worse, and medicine does not help your pain. Summary  After the procedure, it is common to have a small  amount of blood in your poop. You may also have mild cramping and bloating in your belly.  For the first 24 hours after the procedure, do not drive or use machinery, do not sign important documents, and do not drink alcohol.  Get help right away if you have a lot of blood in your poop, feel sick to your stomach, have a fever, or have more belly pain. This information is not intended to replace advice given to you by your health care provider. Make sure you discuss any questions you have with your health care provider. Document Released: 03/02/2010  Document Revised: 11/28/2016 Document Reviewed: 10/23/2015 Elsevier Interactive Patient Education  2019 [employer].

## 2018-04-19 LAB — MITOCHONDRIAL ANTIBODIES

## 2018-04-22 ENCOUNTER — Encounter (HOSPITAL_COMMUNITY): Payer: Self-pay | Admitting: Internal Medicine

## 2018-04-23 ENCOUNTER — Other Ambulatory Visit: Payer: Self-pay

## 2018-04-23 ENCOUNTER — Encounter: Payer: Self-pay | Admitting: General Surgery

## 2018-04-23 ENCOUNTER — Ambulatory Visit (INDEPENDENT_AMBULATORY_CARE_PROVIDER_SITE_OTHER): Payer: Self-pay | Admitting: General Surgery

## 2018-04-23 VITALS — BP 112/80 | HR 76 | Temp 98.6°F | Wt 256.8 lb

## 2018-04-23 DIAGNOSIS — K439 Ventral hernia without obstruction or gangrene: Secondary | ICD-10-CM

## 2018-04-23 DIAGNOSIS — R740 Nonspecific elevation of levels of transaminase and lactic acid dehydrogenase [LDH]: Secondary | ICD-10-CM

## 2018-04-23 DIAGNOSIS — R7401 Elevation of levels of liver transaminase levels: Secondary | ICD-10-CM | POA: Insufficient documentation

## 2018-04-23 NOTE — Patient Instructions (Signed)
Laparoscopic Ventral Hernia Repair Laparoscopic ventral hernia repairis a procedure to fix a bulge of tissue that pushes through a weak area of muscle in the abdomen (ventral hernia). A ventral hernia may be at the belly button (umbilical), above the belly button (epigastric), or at the incision site from previous abdominal surgery (incisional hernia). You may have this procedure as emergency surgery if part of your intestine gets trapped inside the hernia and starts to lose its blood supply (strangulation). Laparoscopic surgery is done through small incisions using a thin surgical telescope with a light and camera on the end (laparoscope). During surgery, your surgeon will use images from the laparoscope to guide the procedure. A mesh screen will be placed in the hernia to close the opening and strengthen the abdominal wall. Tell a health care provider about:  Any allergies you have.  All medicines you are taking, including vitamins, herbs, eye drops, creams, and over-the-counter medicines.  Any problems you or family members have had with anesthetic medicines.  Any blood disorders you have.  Any surgeries you have had.  Any medical conditions you have.  Whether you are pregnant or may be pregnant. What are the risks? Generally, this is a safe procedure. However, problems may occur, including:  Infection.  Bleeding.  Allergic reactions to medicines.  Damage to other structures or organs in the abdomen.  Trouble urinating or having a bowel movement after surgery.  Pneumonia.  Blood clots.  The hernia coming back after surgery.  Fluid buildup in the area of the hernia. In some cases, your health care provider may need to switch from a laparoscopic procedure to a procedure that is done through a single, larger incision in the abdomen (open procedure). You may need an open procedure if:  You have a hernia that is difficult to repair.  Your organs are hard to see.  You have  bleeding problems during the laparoscopic procedure. Medicines  Ask your health care provider about: ? Changing or stopping your regular medicines. This is especially important if you are taking diabetes medicines or blood thinners. ? Taking medicines such as aspirin and ibuprofen. These medicines can thin your blood. Do not take these medicines before your procedure if your health care provider instructs you not to.  You may be given antibiotic medicine to help prevent infection. General instructions   You may be asked to take a laxative or do an enema to empty your bowel before surgery (bowel prep).  Do not use any products that contain nicotine or tobacco, such as cigarettes and e-cigarettes. If you need help quitting, ask your health care provider.  You may need to have tests before the procedure, such as: ? Blood tests. ? Urine tests. ? Abdominal ultrasound. ? Chest X-ray. ? Electrocardiogram (ECG).  Plan to have someone take you home from the hospital or clinic.  If you will be going home right after the procedure, plan to have someone with you for 24 hours. What happens during the procedure?  To reduce your risk of infection: ? Your health care team will wash or sanitize their hands. ? Your skin will be washed with soap.  An IV tube will be inserted into one of your veins.  You will be given one or more of the following: ? A medicine to help you relax (sedative). ? A medicine to make you fall asleep (general anesthetic).  A small incision will be made in your abdomen. A hollow metal tube (trocar) will be placed through  the incision.  A tube will be placed through the trocar to inflate your abdomen with air-like gas. This makes it easier for your surgeon to see inside your abdomen and do the repair.  The laparoscope will be inserted into your abdomen through the trocar. The laparoscope will send images to a monitor in the operating room.  Other trocars will be put  through other small incisions in your abdomen. The surgical instruments needed for the procedure will be placed through these trocars.  The tissue or intestines that make up the hernia will be moved back into place.  The edges of the hernia may be stitched together.  A piece of mesh will be used to close the hernia. Stitches (sutures), clips, or staples will be used to keep the mesh in place.  A bandage (dressing) or skin glue will be put over the incisions. The procedure may vary among health care providers and hospitals. What happens after the procedure?  Your blood pressure, heart rate, breathing rate, and blood oxygen level will be monitored until the medicines you were given have worn off.  You will continue to receive fluids and medicines through an IV tube. Your IV tube will be removed when you can drink clear fluids.  You will be given pain medicine as needed.  You will be encouraged to get up and walk around as soon as possible.  You may have to wear compression stockings. These stockings help to prevent blood clots and reduce swelling in your legs.  You will be shown how to do deep breathing exercises to help prevent a lung infection.  Do not drive for 24 hours if you were given a sedative. This information is not intended to replace advice given to you by your health care provider. Make sure you discuss any questions you have with your health care provider. Document Released: 01/15/2012 Document Revised: 09/15/2015 Document Reviewed: 09/15/2015 Elsevier Interactive Patient Education  2019 [employer]   Liver Biopsy  The liver is a large organ in the upper right side of the abdomen. A liver biopsy is a procedure in which a tissue sample is taken from the liver and examined under a microscope. There are three types of liver biopsies:  Percutaneous. A needle is used to remove a sample through an incision in your abdomen.  Laparoscopic. Several incisions are made in the  abdomen. A sample is removed with the help of a tiny camera.  Transjugular. An incision is made in your neck in the area of the jugular vein. A sample is removed through a small flexible tube that is passed down the blood vessel and into your liver. Tell a health care provider about:  Any allergies you have.  All medicines you are taking, including vitamins, herbs, eye drops, creams, and over-the-counter medicines.  Any problems you or family members have had with anesthetic medicines.  Any blood disorders you have.  Any surgeries you have had.  Any medical conditions you have.  Whether you are pregnant or may be pregnant. What are the risks? Generally, this is a safe procedure. However, problems can occur and include:  Bleeding.  Infection.  Bruising.  Pain.  Injury to nearby organs or tissues, such as nerves, gallbladder, liver, or lungs. What happens before the procedure? Eating and drinking restrictions  You may be asked not to drink or eat for 6-8 hours before the liver biopsy. You may be allowed to eat a light breakfast. Talk to your health care provider about  when you should stop eating and drinking. Medicines Ask your health care provider about:  Changing or stopping your regular medicines. This is especially important if you are taking diabetes medicines or blood thinners.  Taking medicines such as aspirin and ibuprofen. These medicines can thin your blood. Do not take these medicines unless your health care provider tells you to take them.  Taking over-the-counter medicines, vitamins, herbs, and supplements. General instructions  Do not use any products that contain nicotine or tobacco, such as cigarettes and e-cigarettes. If you need help quitting, ask your health care provider.  Plan to have someone take you home from the hospital or clinic.  Plan to have a responsible adult care for you for at least 24 hours after you leave the hospital or clinic. This is  important.  You may have blood or urine tests.  Ask your health care provider what steps will be taken to prevent infection. These may include: ? Removing hair at the surgery site. ? Washing skin with a germ-killing soap. ? Taking antibiotic medicine. What happens during the procedure?  An IV will be inserted into one of your veins. ? You will be given one or more of the following: ? A medicine to help you relax (sedative). ? A medicine to numb the area (local anesthetic). ? A medicine to make you fall asleep (general anesthetic).  Your health care provider will use one of the following procedures to remove samples from your liver. These procedures may vary among health care providers and hospitals. Laparoscopic liver biopsy  You will lie on your back.  Several small incisions will be made in your abdomen.  Your health care provider will pass a tiny camera through one incision. The camera will allow the liver to be viewed on a TV monitor in the operating room.  Tools will be passed through the other incision or incisions.  Samples of the liver will be removed using the tools.  The incisions will be closed with stitches (sutures).  A bandage (dressing) may be placed over the incisions.  A bandage (dressing) may be placed over the incision. What happens after the procedure?  Your blood pressure, heart rate, breathing rate, and blood oxygen level will be monitored until you leave the hospital or clinic.  You will be asked to rest quietly for 2-4 hours or longer.  You will be closely monitored for bleeding from the biopsy site.  You may be allowed to go home when the medicines have worn off and you can walk, drink, eat, and use the bathroom. Summary  A liver biopsy is a procedure in which a tissue sample is taken from the liver and examined under a microscope.  This is a safe procedure, but problems can occur, including bleeding, infection, pain, or injury to nearby organs  or tissues.  Ask your health care provider about changing or stopping your regular medicines.  Plan to have someone take you home from the hospital or clinic and to be with you for 24 hours after the procedure. This information is not intended to replace advice given to you by your health care provider. Make sure you discuss any questions you have with your health care provider. Document Released: 04/20/2003 Document Revised: 02/07/2017 Document Reviewed: 02/07/2017 Elsevier Interactive Patient Education  2019 [employer].

## 2018-04-23 NOTE — Progress Notes (Signed)
[facility] History and Physical  Reason for Referral: Ventral Hernia/ Supraumbilical hernia  Referring Physician:  Dr. Wayna ChaletFerguson    [name] is a 33 y.o. female.  HPI: Ms. [name] is a 33 yo with a large supraumbilical ventral hernia that has colon inside of it. This has been confirmed on physical exam and CT scan, and she reported having this hernia since the birth of her twins 8 years ago. She noted pain with bending and walking. She had her last baby December and the hernia was more tender during that pregnancy. During her workup for the hernia she as found to have a thickened cecum, and I wanted her to get evaluated with a colonoscopy to ensure that we were not missing anything. She has undergone colonoscopy with Dr. Karilyn Cotaehman and this demonstrated congestion of the area but no mass or colitis. Biopsies revealed no concerning pathology. The patient has never endorsed any obstructive symptoms, and has no nausea/vomiting. She has regular Bms that are non bloody.    At the time of her hernia repair, we are trying to coordinate tubal ligation with Dr. Emelda FearFerguson.   Past Medical History:  Diagnosis Date  . Breast nodule 10/10/2014  . Breast pain 10/10/2014  . BV (bacterial vaginosis) 11/12/2012  . Depression   . Fatty liver   . Hypothyroid 02/14/2015  . Irregular menses 05/21/2013  . Migraines   . Obesity   . Other and unspecified ovarian cyst 05/31/2013   Right complex ?cystic vs solid mass on ovary will schedule appt with [facility]  . Pregnant 02/14/2015  . Thyroid disease    Reported Hashimoto's Thyroiditis in past    Past Surgical History:  Procedure Laterality Date  . BIOPSY  04/17/2018   Procedure: BIOPSY;  Surgeon: Malissa Hippoehman, Najeeb U, MD;  Location: [facility];  Service: Endoscopy;;  colon  . CESAREAN SECTION    . COLONOSCOPY    . COLONOSCOPY WITH PROPOFOL N/A 04/17/2018   Procedure: COLONOSCOPY WITH PROPOFOL;  Surgeon: Malissa Hippoehman, Najeeb U, MD;  Location: [facility];   Service: Endoscopy;  Laterality: N/A;  10:50  . OOPHORECTOMY    . TONSILLECTOMY    . WISDOM TOOTH EXTRACTION      Family History  Problem Relation Age of Onset  . Diabetes Maternal Grandmother   . Hypertension Maternal Grandmother   . Cancer Maternal Grandmother        breast  . Diabetes Maternal Grandfather   . Hypertension Maternal Grandfather   . Leukemia Maternal Grandfather   . Diabetes Mother   . Hypertension Mother   . Heart attack Mother   . Stroke Mother   . Kidney disease Paternal Grandfather   . Seizures Father   . Other Father        back problems  . Dementia Paternal Grandmother   . Cancer Maternal Aunt   . Diabetes Maternal Uncle   . Colon cancer Cousin   . Stomach cancer Other   . Ovarian cancer Cousin   . Ulcerative colitis Neg Hx   . Crohn's disease Neg Hx     Social History   Tobacco Use  . Smoking status: Former Smoker    Packs/day: 0.25    Years: 8.00    Pack years: 2.00    Types: Cigarettes    Last attempt to quit: 10/07/2010    Years since quitting: 7.5  . Smokeless tobacco: Never Used  Substance Use Topics  . Alcohol use: No    Comment: occ.;  not now  . Drug use: No    Medications: I have reviewed the patient's current medications. Allergies as of 04/23/2018      Reactions   Imitrex [sumatriptan]    Makes migraines worse.    Maxalt [rizatriptan]    Makes migraines worse   Phenergan [promethazine Hcl] Nausea And Vomiting   Pill form; IV ok   Tape Other (See Comments)   Blisters on abdomen after C Section      Medication List       Accurate as of April 23, 2018  3:12 PM. Always use your most recent med list.        levothyroxine 150 MCG tablet Commonly known as:  SYNTHROID, LEVOTHROID Take 1 tablet (150 mcg total) by mouth daily before breakfast.   prenatal multivitamin Tabs tablet Take 1 tablet by mouth daily.        ROS:  A comprehensive review of systems was negative except for: Gastrointestinal: positive for  abdominal pain Endocrine: positive for thyroid disease, prior hashimotos  Blood pressure 112/80, pulse 76, temperature 98.6 F (37 C), temperature source Temporal, weight 256 lb 12.8 oz (116.5 kg), currently breastfeeding. Physical Exam Vitals signs reviewed.  Constitutional:      Appearance: Normal appearance.  HENT:     Head: Normocephalic and atraumatic.     Nose: Nose normal.     Mouth/Throat:     Mouth: Mucous membranes are moist.  Eyes:     Extraocular Movements: Extraocular movements intact.     Pupils: Pupils are equal, round, and reactive to light.  Neck:     Musculoskeletal: Normal range of motion and neck supple.  Cardiovascular:     Rate and Rhythm: Normal rate and regular rhythm.  Pulmonary:     Effort: Pulmonary effort is normal.     Breath sounds: Normal breath sounds.  Abdominal:     General: There is no distension.     Palpations: Abdomen is soft.     Tenderness: There is abdominal tenderness.     Hernia: A hernia is present.  Musculoskeletal: Normal range of motion.        General: No swelling.  Skin:    General: Skin is warm and dry.  Neurological:     General: No focal deficit present.     Mental Status: She is alert and oriented to person, place, and time.  Psychiatric:        Mood and Affect: Mood normal.        Behavior: Behavior normal.        Thought Content: Thought content normal.        Judgment: Judgment normal.     Results: Colonoscopy 04/17/18 - The examined portion of the ileum was normal. - Congested mucosa in the ascending colon and in the cecum. Biopsied. - External hemorrhoids.  Pathology:  Diagnosis Colon, biopsy, cecal and ascending - BENIGN COLONIC MUCOSA WITH UNDERLYING LYMPHOID AGGREGATES. - NO DYSPLASIA OR MALIGNANCY.  CT a/p - reviewed Supraumbilical hernia with neck measuring 5X4cm and smaller 2cm defect at the umbilicus (from superior to inferior on the longitudinal axis measures about 10cm  Between the large defect  and small defect); no obvious gallstones but some sludge possible   Assessment & Plan:  [name] is a 33 y.o. female with a large supraumbilical ventral hernia (no prior surgeries). She had concerning thickening of the cecum but this has been confirmed to be benign. Potentially the transverse colon being in the hernia led  to some congestion of the venous drainage and congestion in that portion of the ascending colon. She is now ready to undergo laparoscopic hernia repair with mesh. During her workup with Dr. Karilyn Cota he did note a transaminitis. She reports having it for at least 8 years and being told it was related to her having mono in the past. She has never had any gallbladder pain or problems, but does get itching in pregnancy consistent with cholestasis. She denies any RUQ pain with greasy foods or nausea/vomiting or bloating.  Given the transaminitis will do a liver biopsy at the time of surgery at Dr. Patty Sermons request.   Laparoscopic ventral hernia repair with mesh and liver biopsy  Dr. Emelda Fear doing a tubal ligation at the same procedure   All questions were answered to the satisfaction of the patient.  The risk and benefits of laparoscopic hernia repair with mesh and liver biopsy were discussed including but not limited to bleeding, infection, use of mesh, risk of injury to underlying bowel, need to do a primary repair if the bowel is injured, risk of hernia recurrence but that this is reduced with mesh use, risk of if she does recur that she will need to referred to plastic surgery for a component separation due to the larger size of the hernia, discussed the additional risk of bleeding related to the liver biopsy and reason behind the liver biopsy.  After careful consideration, [name] has decided to proceed.    Dr. Emelda Fear will consent the patient for her tubal ligation.   I spent over 25 minutes discussing this plan with the patient and discussing the surgery and risk.    Lucretia Roers 04/23/2018, 3:12 PM

## 2018-04-24 NOTE — H&P (Signed)
[facility] History and Physical  Reason for Referral: Ventral Hernia/ Supraumbilical hernia  Referring Physician:  Dr. Ferguson    [name] is a 32 y.o. female.  HPI: [name] is a 32 yo with a large supraumbilical ventral hernia that has colon inside of it. This has been confirmed on physical exam and CT scan, and she reported having this hernia since the birth of her twins 8 years ago. She noted pain with bending and walking. She had her last baby December and the hernia was more tender during that pregnancy. During her workup for the hernia she as found to have a thickened cecum, and I wanted her to get evaluated with a colonoscopy to ensure that we were not missing anything. She has undergone colonoscopy with Dr. Rehman and this demonstrated congestion of the area but no mass or colitis. Biopsies revealed no concerning pathology. The patient has never endorsed any obstructive symptoms, and has no nausea/vomiting. She has regular Bms that are non bloody.    At the time of her hernia repair, we are trying to coordinate tubal ligation with Dr. Ferguson.   Past Medical History:  Diagnosis Date  . Breast nodule 10/10/2014  . Breast pain 10/10/2014  . BV (bacterial vaginosis) 11/12/2012  . Depression   . Fatty liver   . Hypothyroid 02/14/2015  . Irregular menses 05/21/2013  . Migraines   . Obesity   . Other and unspecified ovarian cyst 05/31/2013   Right complex ?cystic vs solid mass on ovary will schedule appt with [facility]  . Pregnant 02/14/2015  . Thyroid disease    Reported Hashimoto's Thyroiditis in past    Past Surgical History:  Procedure Laterality Date  . BIOPSY  04/17/2018   Procedure: BIOPSY;  Surgeon: Rehman, Najeeb U, MD;  Location: [facility];  Service: Endoscopy;;  colon  . CESAREAN SECTION    . COLONOSCOPY    . COLONOSCOPY WITH PROPOFOL N/A 04/17/2018   Procedure: COLONOSCOPY WITH PROPOFOL;  Surgeon: Rehman, Najeeb U, MD;  Location: [facility];   Service: Endoscopy;  Laterality: N/A;  10:50  . OOPHORECTOMY    . TONSILLECTOMY    . WISDOM TOOTH EXTRACTION      Family History  Problem Relation Age of Onset  . Diabetes Maternal Grandmother   . Hypertension Maternal Grandmother   . Cancer Maternal Grandmother        breast  . Diabetes Maternal Grandfather   . Hypertension Maternal Grandfather   . Leukemia Maternal Grandfather   . Diabetes Mother   . Hypertension Mother   . Heart attack Mother   . Stroke Mother   . Kidney disease Paternal Grandfather   . Seizures Father   . Other Father        back problems  . Dementia Paternal Grandmother   . Cancer Maternal Aunt   . Diabetes Maternal Uncle   . Colon cancer Cousin   . Stomach cancer Other   . Ovarian cancer Cousin   . Ulcerative colitis Neg Hx   . Crohn's disease Neg Hx     Social History   Tobacco Use  . Smoking status: Former Smoker    Packs/day: 0.25    Years: 8.00    Pack years: 2.00    Types: Cigarettes    Last attempt to quit: 10/07/2010    Years since quitting: 7.5  . Smokeless tobacco: Never Used  Substance Use Topics  . Alcohol use: No    Comment: occ.;   not now  . Drug use: No    Medications: I have reviewed the patient's current medications. Allergies as of 04/23/2018      Reactions   Imitrex [sumatriptan]    Makes migraines worse.    Maxalt [rizatriptan]    Makes migraines worse   Phenergan [promethazine Hcl] Nausea And Vomiting   Pill form; IV ok   Tape Other (See Comments)   Blisters on abdomen after C Section      Medication List       Accurate as of April 23, 2018  3:12 PM. Always use your most recent med list.        levothyroxine 150 MCG tablet Commonly known as:  SYNTHROID, LEVOTHROID Take 1 tablet (150 mcg total) by mouth daily before breakfast.   prenatal multivitamin Tabs tablet Take 1 tablet by mouth daily.        ROS:  A comprehensive review of systems was negative except for: Gastrointestinal: positive for  abdominal pain Endocrine: positive for thyroid disease, prior hashimotos  Blood pressure 112/80, pulse 76, temperature 98.6 F (37 C), temperature source Temporal, weight 256 lb 12.8 oz (116.5 kg), currently breastfeeding. Physical Exam Vitals signs reviewed.  Constitutional:      Appearance: Normal appearance.  HENT:     Head: Normocephalic and atraumatic.     Nose: Nose normal.     Mouth/Throat:     Mouth: Mucous membranes are moist.  Eyes:     Extraocular Movements: Extraocular movements intact.     Pupils: Pupils are equal, round, and reactive to light.  Neck:     Musculoskeletal: Normal range of motion and neck supple.  Cardiovascular:     Rate and Rhythm: Normal rate and regular rhythm.  Pulmonary:     Effort: Pulmonary effort is normal.     Breath sounds: Normal breath sounds.  Abdominal:     General: There is no distension.     Palpations: Abdomen is soft.     Tenderness: There is abdominal tenderness.     Hernia: A hernia is present.  Musculoskeletal: Normal range of motion.        General: No swelling.  Skin:    General: Skin is warm and dry.  Neurological:     General: No focal deficit present.     Mental Status: She is alert and oriented to person, place, and time.  Psychiatric:        Mood and Affect: Mood normal.        Behavior: Behavior normal.        Thought Content: Thought content normal.        Judgment: Judgment normal.     Results: Colonoscopy 04/17/18 - The examined portion of the ileum was normal. - Congested mucosa in the ascending colon and in the cecum. Biopsied. - External hemorrhoids.  Pathology:  Diagnosis Colon, biopsy, cecal and ascending - BENIGN COLONIC MUCOSA WITH UNDERLYING LYMPHOID AGGREGATES. - NO DYSPLASIA OR MALIGNANCY.  CT a/p - reviewed Supraumbilical hernia with neck measuring 5X4cm and smaller 2cm defect at the umbilicus (from superior to inferior on the longitudinal axis measures about 10cm  Between the large defect  and small defect); no obvious gallstones but some sludge possible   Assessment & Plan:  [name] is a 32 y.o. female with a large supraumbilical ventral hernia (no prior surgeries). She had concerning thickening of the cecum but this has been confirmed to be benign. Potentially the transverse colon being in the hernia led   to some congestion of the venous drainage and congestion in that portion of the ascending colon. She is now ready to undergo laparoscopic hernia repair with mesh. During her workup with Dr. Rehman he did note a transaminitis. She reports having it for at least 8 years and being told it was related to her having mono in the past. She has never had any gallbladder pain or problems, but does get itching in pregnancy consistent with cholestasis. She denies any RUQ pain with greasy foods or nausea/vomiting or bloating.  Given the transaminitis will do a liver biopsy at the time of surgery at Dr. Rehman's request.   Laparoscopic ventral hernia repair with mesh and liver biopsy  Dr. Ferguson doing a tubal ligation at the same procedure   All questions were answered to the satisfaction of the patient.  The risk and benefits of laparoscopic hernia repair with mesh and liver biopsy were discussed including but not limited to bleeding, infection, use of mesh, risk of injury to underlying bowel, need to do a primary repair if the bowel is injured, risk of hernia recurrence but that this is reduced with mesh use, risk of if she does recur that she will need to referred to plastic surgery for a component separation due to the larger size of the hernia, discussed the additional risk of bleeding related to the liver biopsy and reason behind the liver biopsy.  After careful consideration, [name] has decided to proceed.    Dr. Ferguson will consent the patient for her tubal ligation.   I spent over 25 minutes discussing this plan with the patient and discussing the surgery and risk.    [name] 04/23/2018, 3:12 PM       

## 2018-05-05 ENCOUNTER — Encounter: Payer: Self-pay | Admitting: Obstetrics and Gynecology

## 2018-05-08 ENCOUNTER — Other Ambulatory Visit (HOSPITAL_COMMUNITY): Payer: Self-pay

## 2018-05-13 ENCOUNTER — Ambulatory Visit: Admit: 2018-05-13 | Payer: Self-pay | Admitting: General Surgery

## 2018-05-13 SURGERY — LIGATION, FALLOPIAN TUBE, BILATERAL
Anesthesia: General | Laterality: Bilateral

## 2018-05-13 SURGERY — REPAIR, HERNIA, VENTRAL, LAPAROSCOPIC
Anesthesia: General

## 2018-06-26 ENCOUNTER — Encounter: Payer: Self-pay | Admitting: *Deleted

## 2018-06-29 ENCOUNTER — Encounter: Payer: Self-pay | Admitting: Obstetrics and Gynecology

## 2018-06-29 ENCOUNTER — Other Ambulatory Visit: Payer: Self-pay

## 2018-06-29 ENCOUNTER — Ambulatory Visit (INDEPENDENT_AMBULATORY_CARE_PROVIDER_SITE_OTHER): Payer: Medicaid Other | Admitting: Obstetrics and Gynecology

## 2018-06-29 VITALS — BP 129/93 | HR 64 | Ht 62.0 in | Wt 265.8 lb

## 2018-06-29 DIAGNOSIS — Z113 Encounter for screening for infections with a predominantly sexual mode of transmission: Secondary | ICD-10-CM

## 2018-06-29 NOTE — Progress Notes (Signed)
Patient ID: [name], female   DOB: 1985/06/29, 33 y.o.   MRN: [id number]  Preoperative History and Physical  [name] is a 33 y.o. [id number] here for surgical management of contraception management. No significant preoperative concerns. Is breast feeding 80 month old but periods are still abnormal  Proposed surgery: laparoscopic BTL  Past Medical History:  Diagnosis Date  . Breast nodule 10/10/2014  . Breast pain 10/10/2014  . BV (bacterial vaginosis) 11/12/2012  . Depression   . Fatty liver   . Hypothyroid 02/14/2015  . Irregular menses 05/21/2013  . Migraines   . Obesity   . Other and unspecified ovarian cyst 05/31/2013   Right complex ?cystic vs solid mass on ovary will schedule appt with [facility]  . Pregnant 02/14/2015  . Thyroid disease    Reported Hashimoto's Thyroiditis in past   Past Surgical History:  Procedure Laterality Date  . BIOPSY  04/17/2018   Procedure: BIOPSY;  Surgeon: Malissa Hippo, MD;  Location: [facility];  Service: Endoscopy;;  colon  . CESAREAN SECTION    . COLONOSCOPY    . COLONOSCOPY WITH PROPOFOL N/A 04/17/2018   Procedure: COLONOSCOPY WITH PROPOFOL;  Surgeon: Malissa Hippo, MD;  Location: [facility];  Service: Endoscopy;  Laterality: N/A;  10:50  . OOPHORECTOMY    . TONSILLECTOMY    . WISDOM TOOTH EXTRACTION     OB History  Gravida Para Term Preterm AB Living  3 3 3     4   SAB TAB Ectopic Multiple Live Births        1 4    # Outcome Date GA Lbr Len/2nd Weight Sex Delivery Anes PTL Lv  3 Term 02/01/18 [redacted]w[redacted]d 02:31 / 00:04 8 lb 1.8 oz (3.68 kg) F VBAC EPI  LIV     Birth Comments: none  2 Term 10/21/15 [redacted]w[redacted]d 13:30 / 01:57 7 lb 13 oz (3.545 kg) M Vag-Spont EPI N LIV     Complications: Cholestasis  1A Term 05/04/10 [redacted]w[redacted]d  6 lb 6 oz (2.892 kg) F CS-LTranv Spinal N LIV  1B Term 05/04/10 [redacted]w[redacted]d  6 lb 10 oz (3.005 kg) F CS-LTranv Spinal N LIV  Patient denies any other pertinent gynecologic issues.   Current Outpatient Medications on File  Prior to Visit  Medication Sig Dispense Refill  . levothyroxine (SYNTHROID, LEVOTHROID) 150 MCG tablet Take 1 tablet (150 mcg total) by mouth daily before breakfast. 30 tablet 3  . Prenatal Vit-Fe Fumarate-FA (PRENATAL MULTIVITAMIN) TABS tablet Take 1 tablet by mouth daily.     No current facility-administered medications on file prior to visit.    Allergies  Allergen Reactions  . Imitrex [Sumatriptan]     Makes migraines worse.   . Maxalt [Rizatriptan]     Makes migraines worse  . Phenergan [Promethazine Hcl] Nausea And Vomiting    Pill form; IV ok  . Tape Other (See Comments)    Blisters on abdomen after C Section    Social History:   reports that she quit smoking about 7 years ago. Her smoking use included cigarettes. She has a 2.00 pack-year smoking history. She has never used smokeless tobacco. She reports that she does not drink alcohol or use drugs.  Family History  Problem Relation Age of Onset  . Diabetes Maternal Grandmother   . Hypertension Maternal Grandmother   . Cancer Maternal Grandmother        breast  . Diabetes Maternal Grandfather   . Hypertension Maternal Grandfather   .  Leukemia Maternal Grandfather   . Diabetes Mother   . Hypertension Mother   . Heart attack Mother   . Stroke Mother   . Kidney disease Paternal Grandfather   . Seizures Father   . Other Father        back problems  . Dementia Paternal Grandmother   . Cancer Maternal Aunt   . Diabetes Maternal Uncle   . Colon cancer Cousin   . Stomach cancer Other   . Ovarian cancer Cousin   . Ulcerative colitis Neg Hx   . Crohn's disease Neg Hx     Review of Systems: Noncontributory  PHYSICAL EXAM: currently breastfeeding. General appearance - alert, well appearing, and in no distress Chest - clear to auscultation, no wheezes, rales or rhonchi, symmetric air entry Heart - normal rate and regular rhythm Abdomen - soft, nontender, supraumbilical hernia Pelvic: gcchl no pelvic exam not done.  Period recently ended.  Extremities - peripheral pulses normal, no pedal edema, no clubbing or cyanosis  Labs: No results found for this or any previous visit (from the past 336 hour(s)).  Imaging Studies: No results found.  Assessment: Patient Active Problem List   Diagnosis Date Noted  . Transaminitis 04/23/2018  . Supraumbilical hernia without gangrene and without obstruction 04/09/2018  . Normal labor 01/31/2018  . Breech presentation 01/18/2018  . History of vaginal delivery following previous cesarean delivery 07/02/2017  . UTI (urinary tract infection) during pregnancy, first trimester 06/23/2017  . Anxiety 05/30/2015  . 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 Previous cesarean section complicating pregnancy 03/07/2015  . S/P right oophorectomy 03/07/2015  . Hypothyroidism 02/14/2015  . Breast pain 10/10/2014    Plan: Patient will undergo surgical management with BTL Resign Tubal consent forms   By signing my name below, I, Arnette NorrisMari Johnson, attest that this documentation has been prepared under the direction and in the presence of Tilda BurrowFerguson, Kingsley Herandez V, MD. Electronically Signed: Arnette NorrisMari Johnson Medical Scribe. 06/29/18. 10:55 AM.  I personally performed the services described in this documentation, which was SCRIBED in my presence. The recorded information has been reviewed and considered accurate. It has been edited as necessary during review. Tilda BurrowJohn V Arless Vineyard, MD

## 2018-06-30 LAB — GC/CHLAMYDIA PROBE AMP

## 2018-07-08 ENCOUNTER — Telehealth: Payer: Self-pay | Admitting: Obstetrics and Gynecology

## 2018-07-08 NOTE — Telephone Encounter (Signed)
I have spoken to [facility] at Dr Henreitta Leber' office. I have not heard back. Can you do me a favor and see where things stand with [name] tomorrow? The office # is [phone].

## 2018-07-08 NOTE — Telephone Encounter (Signed)
Have you had a chance to get with Dr bridges about coordinating surgery for this patient?  Thank YOu

## 2018-08-07 NOTE — Patient Instructions (Signed)
Your procedure is scheduled on: 08/17/2018  Report to [facility] at   6:15  AM.  Call this number if you have problems the morning of surgery: [phone]   Remember:   Do not Eat or Drink after midnight   :  Take these medicines the morning of surgery with A SIP OF WATER: Synthroid   Do not wear jewelry, make-up or nail polish.  Do not wear lotions, powders, or perfumes. You may wear deodorant.  Do not shave 48 hours prior to surgery. Men may shave face and neck.  Do not bring valuables to the hospital.  Contacts, dentures or bridgework may not be worn into surgery.  Leave suitcase in the car. After surgery it may be brought to your room.  For patients admitted to the hospital, checkout time is 11:00 AM the day of discharge.   Patients discharged the day of surgery will not be allowed to drive home.    Special Instructions: Shower using CHG night before surgery and shower the day of surgery use CHG.  Use special wash - you have one bottle of CHG for all showers.  You should use approximately 1/2 of the bottle for each shower.  Laparoscopic Ventral Hernia Repair, Care After This sheet gives you information about how to care for yourself after your procedure. Your health care provider may also give you more specific instructions. If you have problems or questions, contact your health care provider. What can I expect after the procedure? After the procedure, it is common to have:  Pain, discomfort, or soreness. Follow these instructions at home: Incision care   Follow instructions from your health care provider about how to take care of your incision. Make sure you: ? Wash your hands with soap and water before you change your bandage (dressing) or before you touch your abdomen. If soap and water are not available, use hand sanitizer. ? Change your dressing as told by your health care provider. ? Leave stitches (sutures), skin glue, or adhesive strips in place. These skin closures  may need to stay in place for 2 weeks or longer. If adhesive strip edges start to loosen and curl up, you may trim the loose edges. Do not remove adhesive strips completely unless your health care provider tells you to do that.  Check your incision area every day for signs of infection. Check for: ? Redness, swelling, or pain. ? Fluid or blood. ? Warmth. ? Pus or a bad smell. Bathing   Do not take baths, swim, or use a hot tub until your health care provider approves. Ask your health care provider if you can take showers. You may only be allowed to take sponge baths for bathing.  Keep your bandage (dressing) dry until your health care provider says it can be removed. Activity  Do not lift anything that is heavier than 10 lb (4.5 kg) until your health care provider approves.  Do not drive or use heavy machinery while taking prescription pain medicine. Ask your health care provider when it is safe for you to drive or use heavy machinery.  Do not drive for 24 hours if you were given a medicine to help you relax (sedative) during your procedure.  Rest as told by your health care provider. You may return to your normal activities when your health care provider approves. General instructions  Take over-the-counter and prescription medicines only as told by your health care provider.  To prevent or treat constipation while you are  taking prescription pain medicine, your health care provider may recommend that you: ? Take over-the-counter or prescription medicines. ? Eat foods that are high in fiber, such as fresh fruits and vegetables, whole grains, and beans. ? Limit foods that are high in fat and processed sugars, such as fried and sweet foods.  Drink enough fluid to keep your urine clear or pale yellow.  Hold a pillow over your abdomen when you cough or sneeze. This helps with pain.  Keep all follow-up visits as told by your health care provider. This is important. Contact a health  care provider if:  You have: ? A fever or chills. ? Redness, swelling, or pain around your incision. ? Fluid or blood coming from your incision. ? Pus or a bad smell coming from your incision. ? Pain that gets worse or does not get better with medicine. ? Nausea or vomiting. ? A cough. ? Shortness of breath.  Your incision feels warm to the touch.  You have not had a bowel movement in three days.  You are not able to urinate. Get help right away if:  You have severe pain in your abdomen.  You have persistent nausea and vomiting.  You have redness, warmth, or pain in your leg.  You have chest pain.  You have trouble breathing. Summary  After this procedure, it is common to have pain, discomfort, or soreness.  Follow instructions from your health care provider about how to take care of your incision.  Check your incision area every day for signs of infection. Report any signs of infection to your health care provider.  Keep all follow-up visits as told by your health care provider. This is important. This information is not intended to replace advice given to you by your health care provider. Make sure you discuss any questions you have with your health care provider. Document Released: 01/15/2012 Document Revised: 09/20/2015 Document Reviewed: 09/20/2015 Elsevier Interactive Patient Education  2019 [employer] Inc[employer]  Tubal Ligation Reversal, Care After This sheet gives you information about how to care for yourself after your procedure. Your health care provider may also give you more specific instructions. If you have problems or questions, contact your health care provider. What can I expect after the procedure? After the procedure, it is common to have:  Mild abdominal discomfort. This can include: ? Mild cramping. ? Gas pains or feeling bloated. ? Pain or soreness at the incision areas.  Discomfort in the shoulder area. This is caused by air that is trapped between  your liver and your diaphragm. The discomfort will slowly go away on its own.  Tiredness.  A sore throat if you had a breathing tube.  Nausea or vomiting. Follow these instructions at home: Medicines  Take over-the-counter and prescription medicines only as told by your health care provider.  Do not take aspirin because it can cause bleeding.  Do not drive or use heavy machinery while taking prescription pain medicine. Activity  Rest as told by your health care provider. Gradually return to your normal activities as told by your health care provider.  Avoid sitting for a long time without moving. Get up and move around one or more times every few hours.  Do not douche, use tampons, or have sexual intercourse for an entire menstrual cycle, or as told by your health care provider.  Do not lift anything that is heavier than 10 lb (4.5 kg), or as told by your health care provider.  Have someone help  you with your daily household tasks for the first 7-10 days, or as recommended by your health care provider. Incision care   Follow instructions from your health care provider about how to take care of your incision. Make sure you: ? Wash your hands with soap and water before you change your bandage (dressing). If soap and water are not available, use hand sanitizer. ? Change your dressing as told by your health care provider. ? Leave stitches (sutures), skin glue, or adhesive strips in place. These skin closures may need to stay in place for 2 weeks or longer. If adhesive strip edges start to loosen and curl up, you may trim the loose edges.  Do not take baths, swim, or use a hot tub until your health care provider approves. You may take showers.  Check your incision area every day for signs of infection. Check for: ? Redness, swelling, or pain. ? Fluid or blood. ? Warmth. ? Pus or a bad smell. General instructions  To prevent or treat constipation while you are taking prescription  pain medicine, your health care provider may recommend that you: ? Drink enough fluid to keep your urine pale yellow. ? Take over-the-counter or prescription medicines. ? Eat foods that are high in fiber, such as fresh fruits and vegetables, whole grains, and beans. ? Limit foods that are high in fat and processed sugars, such as fried and sweet foods.  Do not use any products that contain nicotine or tobacco, such as cigarettes and e-cigarettes. These can delay healing. If you need help quitting, ask your health care provider.  Keep all follow-up visits as told by your health care provider. This is important. You will need to have a follow-up X-ray dye test about 3-4 months after the surgery to make sure that your tubes are open. Contact a health care provider if:  You have signs of infection, such as: ? Redness, swelling, or pain around your incision sites. ? Fluid or blood coming from an incision. ? An incision that feels warm to the touch. ? Pus or a bad smell coming from an incision.  Your incision breaks open.  You have a fever.  You have a rash.  You feel dizzy or lightheaded.  You cannot eat or drink without vomiting.  You are constipated. Get help right away if:  You have shortness of breath or difficulty breathing.  You have chest or leg pain.  You repeatedly feel lightheaded.  You faint.  You have increasing pain in your abdomen.  You feel a burning sensation or pain when you urinate.  You have heavy vaginal bleeding when it is not time for your menstrual period. Summary  It is common to have a sore throat, mild abdominal pain, and fatigue after the procedure.  Take over-the-counter and prescription medicines only as told by your health care provider.  Keep all follow-up visits as told by your health care provider. This is important. You will need to have a follow-up X-ray dye test about 3-4 months after the surgery to make sure that your tubes are open.  This information is not intended to replace advice given to you by your health care provider. Make sure you discuss any questions you have with your health care provider. Document Released: 11/25/2008 Document Revised: 01/10/2017 Document Reviewed: 05/09/2016 Elsevier Patient Education  2020 Elsevier Inc.  General Anesthesia, Adult, Care After This sheet gives you information about how to care for yourself after your procedure. Your health care provider may  also give you more specific instructions. If you have problems or questions, contact your health care provider. What can I expect after the procedure? After the procedure, the following side effects are common:  Pain or discomfort at the IV site.  Nausea.  Vomiting.  Sore throat.  Trouble concentrating.  Feeling cold or chills.  Weak or tired.  Sleepiness and fatigue.  Soreness and body aches. These side effects can affect parts of the body that were not involved in surgery. Follow these instructions at home:  For at least 24 hours after the procedure:  Have a responsible adult stay with you. It is important to have someone help care for you until you are awake and alert.  Rest as needed.  Do not: ? Participate in activities in which you could fall or become injured. ? Drive. ? Use heavy machinery. ? Drink alcohol. ? Take sleeping pills or medicines that cause drowsiness. ? Make important decisions or sign legal documents. ? Take care of children on your own. Eating and drinking  Follow any instructions from your health care provider about eating or drinking restrictions.  When you feel hungry, start by eating small amounts of foods that are soft and easy to digest (bland), such as toast. Gradually return to your regular diet.  Drink enough fluid to keep your urine pale yellow.  If you vomit, rehydrate by drinking water, juice, or clear broth. General instructions  If you have sleep apnea, surgery and certain  medicines can increase your risk for breathing problems. Follow instructions from your health care provider about wearing your sleep device: ? Anytime you are sleeping, including during daytime naps. ? While taking prescription pain medicines, sleeping medicines, or medicines that make you drowsy.  Return to your normal activities as told by your health care provider. Ask your health care provider what activities are safe for you.  Take over-the-counter and prescription medicines only as told by your health care provider.  If you smoke, do not smoke without supervision.  Keep all follow-up visits as told by your health care provider. This is important. Contact a health care provider if:  You have nausea or vomiting that does not get better with medicine.  You cannot eat or drink without vomiting.  You have pain that does not get better with medicine.  You are unable to pass urine.  You develop a skin rash.  You have a fever.  You have redness around your IV site that gets worse. Get help right away if:  You have difficulty breathing.  You have chest pain.  You have blood in your urine or stool, or you vomit blood. Summary  After the procedure, it is common to have a sore throat or nausea. It is also common to feel tired.  Have a responsible adult stay with you for the first 24 hours after general anesthesia. It is important to have someone help care for you until you are awake and alert.  When you feel hungry, start by eating small amounts of foods that are soft and easy to digest (bland), such as toast. Gradually return to your regular diet.  Drink enough fluid to keep your urine pale yellow.  Return to your normal activities as told by your health care provider. Ask your health care provider what activities are safe for you. This information is not intended to replace advice given to you by your health care provider. Make sure you discuss any questions you have with your  health  care provider. Document Released: 05/06/2000 Document Revised: 01/31/2017 Document Reviewed: 09/13/2016 Elsevier Patient Education  2020 [employer]

## 2018-08-12 ENCOUNTER — Telehealth: Payer: Self-pay | Admitting: Obstetrics and Gynecology

## 2018-08-12 ENCOUNTER — Other Ambulatory Visit: Payer: Self-pay | Admitting: Obstetrics and Gynecology

## 2018-08-12 NOTE — Telephone Encounter (Signed)
Patient contacted, surgery reviewed, orders placed.

## 2018-08-12 NOTE — Progress Notes (Unsigned)
[name] is an 33 y.o. female. She is a G3P3004 at 3 months postpartum. She is admitted for combined surgery, laparoscopic tubal ligation and ventral hernia repair by Dr Constance Haw at the same anesthesia. She is s/p colonoscopy by Dr Laural Golden for a suspected colonic mass based on the CT results below, and the CT was considered normal. I have confirmed this report with the patient on 08/12/2018.  She affirms her desire for permanent sterilization. She signed Medicaid forms in the office the date of her last office visit, and confirms by phone interview today that she desires permament sterilization.   Pertinent Gynecological History: Menses: flow is moderate Bleeding:  Contraception: condoms DES exposure: unknown Blood transfusions: none Sexually transmitted diseases: no past history Previous GYN Procedures:  Cesarean for twins.  Last mammogram: none Date:  Last pap: normal Date: 02/14/2015 OB History: G3, P3004   Menstrual History: Menarche age:  No LMP recorded.    Past Medical History:  Diagnosis Date  . Breast nodule 10/10/2014  . Breast pain 10/10/2014  . BV (bacterial vaginosis) 11/12/2012  . Depression   . Fatty liver   . Hypothyroid 02/14/2015  . Irregular menses 05/21/2013  . Migraines   . Obesity   . Other and unspecified ovarian cyst 05/31/2013   Right complex ?cystic vs solid mass on ovary will schedule appt with [facility]  . Pregnant 02/14/2015  . Thyroid disease    Reported Hashimoto's Thyroiditis in past    Past Surgical History:  Procedure Laterality Date  . BIOPSY  04/17/2018   Procedure: BIOPSY;  Surgeon: Rogene Houston, MD;  Location: [facility];  Service: Endoscopy;;  colon  . CESAREAN SECTION    . COLONOSCOPY    . COLONOSCOPY WITH PROPOFOL N/A 04/17/2018   Procedure: COLONOSCOPY WITH PROPOFOL;  Surgeon: Rogene Houston, MD;  Location: [facility];  Service: Endoscopy;  Laterality: N/A;  10:50  . OOPHORECTOMY    . TONSILLECTOMY    . WISDOM TOOTH EXTRACTION       Family History  Problem Relation Age of Onset  . Diabetes Maternal Grandmother   . Hypertension Maternal Grandmother   . Cancer Maternal Grandmother        breast  . Diabetes Maternal Grandfather   . Hypertension Maternal Grandfather   . Leukemia Maternal Grandfather   . Diabetes Mother   . Hypertension Mother   . Heart attack Mother   . Stroke Mother   . Kidney disease Paternal Grandfather   . Seizures Father   . Other Father        back problems  . Dementia Paternal Grandmother   . Cancer Maternal Aunt   . Diabetes Maternal Uncle   . Colon cancer Cousin   . Stomach cancer Other   . Ovarian cancer Cousin   . Ulcerative colitis Neg Hx   . Crohn's disease Neg Hx     Social History:  reports that she quit smoking about 7 years ago. Her smoking use included cigarettes. She has a 2.00 pack-year smoking history. She has never used smokeless tobacco. She reports that she does not drink alcohol or use drugs.  Allergies:  Allergies  Allergen Reactions  . Imitrex [Sumatriptan]     Makes migraines worse.   . Maxalt [Rizatriptan]     Makes migraines worse  . Phenergan [Promethazine Hcl] Nausea And Vomiting    Pill form; IV ok  . Tape Other (See Comments)    Blisters on abdomen after C Section    (  Not in a hospital admission)   ROS  currently breastfeeding. Physical Exam  Constitutional: She is oriented to person, place, and time. She appears well-developed and well-nourished.  HENT:  Head: Normocephalic and atraumatic.  Eyes: Pupils are equal, round, and reactive to light.  Neck: Normal range of motion.  Cardiovascular: Normal rate and regular rhythm.  Respiratory: Effort normal.  GI: Soft. There is abdominal tenderness.  Fullness in supraumbilical area. See CT results.  Genitourinary:    Vagina and uterus normal.   Neurological: She is alert and oriented to person, place, and time.  Skin: Skin is warm and dry.  Psychiatric: She has a normal mood and affect.  Her behavior is normal. Judgment and thought content normal.    No results found for this or any previous visit (from the past 24 hour(s)). CLINICAL DATA:  Left upper quadrant bulge with pain  EXAM: CT ABDOMEN WITH CONTRAST  TECHNIQUE: Multidetector CT imaging of the abdomen was performed using the standard protocol following bolus administration of intravenous contrast.  CONTRAST:  100mL OMNIPAQUE IOHEXOL 300 MG/ML  SOLN  COMPARISON:  None.  FINDINGS: Lower chest: No acute abnormality.  Hepatobiliary: No focal liver abnormality. The gallbladder appears normal. Mild intrahepatic biliary dilatation. The common bile duct appears normal.  Pancreas: Unremarkable. No pancreatic ductal dilatation or surrounding inflammatory changes.  Spleen: Normal in size without focal abnormality.  Adrenals/Urinary Tract: Adrenal glands are unremarkable. Kidneys are normal, without renal calculi, focal lesion, or hydronephrosis. Bladder is unremarkable.  Stomach/Bowel: The stomach appears normal. The small bowel loops have a normal caliber. There is a suspicious mass like filling defect involving the ascending colon which measures 6.1 x 4.9 by 5.0 cm, image 44/3. Multiple prominent right lower quadrant ileocolic lymph nodes are identified, image 42/3. There also several prominent lymph nodes within the serosal fat measuring up to 9 mm, image 40/3. No abnormal bowel dilatation identified. There is a large ventral abdominal wall hernia above the level of the umbilicus which measures 13.3 by 6.7 by 9.7 cm. This contains a nonobstructed loop of transverse colon.  Vascular/Lymphatic: No significant vascular findings are present. No enlarged abdominal or pelvic lymph nodes.  Other: No ascites.  Fat containing umbilical hernia noted.  Musculoskeletal: No acute or significant osseous findings.  IMPRESSION: 1. Apparent mass measuring approximately 6 cm is identified within the  ascending colon. This results in luminal narrowing. Can not rule out colonic neoplasm. Further evaluation with colonoscopy is advised. 2. Multiple prominent pericolonic lymph nodes are identified within the right lower quadrant and adjacent serosal fat. 3. Large supraumbilical ventral abdominal wall hernia contains a nonobstructed loop of transverse colon.   Electronically Signed   By: Signa Kellaylor  Stroud M.D.   On: 04/07/2018 10:17 No results found.  Assessment/Plan: Desire for permanent sterilization, Ventral hernia. History of "colonic Mass " on CT, ruled out by colonoscopy. Hypothyroidism, stable on meds. Plan:  Laparoscopic tubal sterilization, fallope rings, in coordination with Dr Henreitta LeberBridges' planned ventral hernia repair. The patient is aware that there may be unexpected findings related to the ventral hernia that may affect the approach to surgery. Risks to adjacent organs , potential for additional surgery , reveiwed this date with ms Ellouise NewerUtter, who confirms that we have discussed these details in the past.( Case rescheduled from original dates by Covid restrictions in April 2020).    Tilda BurrowJohn V Chidinma Clites 08/12/2018, 11:37 AM

## 2018-08-13 ENCOUNTER — Encounter (HOSPITAL_COMMUNITY)
Admission: RE | Admit: 2018-08-13 | Discharge: 2018-08-13 | Disposition: A | Discharge status: Home / Self Care | Payer: Medicaid Other | Source: Ambulatory Visit | Attending: Obstetrics and Gynecology | Admitting: Obstetrics and Gynecology

## 2018-08-13 ENCOUNTER — Other Ambulatory Visit (HOSPITAL_COMMUNITY)
Admission: RE | Admit: 2018-08-13 | Discharge: 2018-08-13 | Disposition: A | Discharge status: Home / Self Care | Payer: HRSA Program | Source: Ambulatory Visit | Attending: General Surgery | Admitting: General Surgery

## 2018-08-13 ENCOUNTER — Encounter (HOSPITAL_COMMUNITY): Payer: Self-pay

## 2018-08-13 ENCOUNTER — Other Ambulatory Visit: Payer: Self-pay

## 2018-08-13 DIAGNOSIS — Z01812 Encounter for preprocedural laboratory examination: Secondary | ICD-10-CM | POA: Diagnosis present

## 2018-08-13 DIAGNOSIS — Z1159 Encounter for screening for other viral diseases: Secondary | ICD-10-CM | POA: Diagnosis not present

## 2018-08-13 LAB — COMPREHENSIVE METABOLIC PANEL
ALT: 172 U/L — ABNORMAL HIGH (ref 0–44)
AST: 171 U/L — ABNORMAL HIGH (ref 15–41)
Albumin: 4 g/dL (ref 3.5–5.0)
Alkaline Phosphatase: 683 U/L — ABNORMAL HIGH (ref 38–126)
Anion gap: 10 (ref 5–15)
BUN: 13 mg/dL (ref 6–20)
CO2: 22 mmol/L (ref 22–32)
Calcium: 8.7 mg/dL — ABNORMAL LOW (ref 8.9–10.3)
Chloride: 104 mmol/L (ref 98–111)
Creatinine, Ser: 0.5 mg/dL (ref 0.44–1.00)
GFR calc Af Amer: >60 mL/min (ref >60)
GFR calc non Af Amer: >60 mL/min (ref >60)
Glucose, Bld: 79 mg/dL (ref 70–99)
Potassium: 3.8 mmol/L (ref 3.5–5.1)
Sodium: 136 mmol/L (ref 135–145)
Total Bilirubin: 0.6 mg/dL (ref 0.3–1.2)
Total Protein: 7.7 g/dL (ref 6.5–8.1)

## 2018-08-13 LAB — CBC WITH DIFFERENTIAL/PLATELET
Abs Immature Granulocytes: 0.03 10*3/uL (ref 0.00–0.07)
Basophils Absolute: 0.1 10*3/uL (ref 0.0–0.1)
Basophils Relative: 1 %
Eosinophils Absolute: 0.7 10*3/uL — ABNORMAL HIGH (ref 0.0–0.5)
Eosinophils Relative: 8 %
HCT: 42.3 % (ref 36.0–46.0)
Hemoglobin: 13.3 g/dL (ref 12.0–15.0)
Immature Granulocytes: 0 %
Lymphocytes Relative: 35 %
Lymphs Abs: 3.1 10*3/uL (ref 0.7–4.0)
MCH: 29.6 pg (ref 26.0–34.0)
MCHC: 31.4 g/dL (ref 30.0–36.0)
MCV: 94.2 fL (ref 80.0–100.0)
Monocytes Absolute: 0.4 10*3/uL (ref 0.1–1.0)
Monocytes Relative: 4 %
Neutro Abs: 4.5 10*3/uL (ref 1.7–7.7)
Neutrophils Relative %: 52 %
Platelets: 336 10*3/uL (ref 150–400)
RBC: 4.49 MIL/uL (ref 3.87–5.11)
RDW: 14.3 % (ref 11.5–15.5)
WBC: 8.8 10*3/uL (ref 4.0–10.5)
nRBC: 0 % (ref 0.0–0.2)

## 2018-08-13 LAB — HCG, SERUM, QUALITATIVE: Preg, Serum: NEGATIVE

## 2018-08-13 LAB — HCG, QUANTITATIVE, PREGNANCY: hCG, Beta Chain, Quant, S: <1 m[IU]/mL (ref <5)

## 2018-08-13 NOTE — Pre-Procedure Instructions (Signed)
To whom it may concern,                    [name] is having surgery on Monday, August 17, 2018. She is having a COVID test done on Thursday, July 2,2020. After she has her COVID test done, she has to be in quarantine, which means she cannot be around anyone except for people in her household. She cannot go to the store, any one else"s home or work. For any questions please feel fee to call Abbie Sons, RN coordinator, [facility] at [phone].  Thank you.                                                     Delphia Grates , RN

## 2018-08-14 LAB — SARS CORONAVIRUS 2 (TAT 6-24 HRS): SARS Coronavirus 2: NEGATIVE

## 2018-08-17 ENCOUNTER — Ambulatory Visit (HOSPITAL_COMMUNITY): Payer: Medicaid Other | Admitting: Anesthesiology

## 2018-08-17 ENCOUNTER — Encounter (HOSPITAL_COMMUNITY): Payer: Self-pay | Admitting: Anesthesiology

## 2018-08-17 ENCOUNTER — Encounter (HOSPITAL_COMMUNITY)
Admission: RE | Disposition: A | Discharge status: Home / Self Care | Payer: Self-pay | Source: Home / Self Care | Attending: Obstetrics and Gynecology

## 2018-08-17 ENCOUNTER — Ambulatory Visit (HOSPITAL_COMMUNITY)
Admission: RE | Admit: 2018-08-17 | Discharge: 2018-08-17 | Disposition: A | Discharge status: Home / Self Care | Payer: Medicaid Other | Attending: Obstetrics and Gynecology | Admitting: Obstetrics and Gynecology

## 2018-08-17 DIAGNOSIS — Z79890 Hormone replacement therapy (postmenopausal): Secondary | ICD-10-CM | POA: Insufficient documentation

## 2018-08-17 DIAGNOSIS — Z87891 Personal history of nicotine dependence: Secondary | ICD-10-CM | POA: Insufficient documentation

## 2018-08-17 DIAGNOSIS — E039 Hypothyroidism, unspecified: Secondary | ICD-10-CM | POA: Diagnosis not present

## 2018-08-17 DIAGNOSIS — R7989 Other specified abnormal findings of blood chemistry: Secondary | ICD-10-CM | POA: Insufficient documentation

## 2018-08-17 DIAGNOSIS — K439 Ventral hernia without obstruction or gangrene: Secondary | ICD-10-CM | POA: Diagnosis present

## 2018-08-17 DIAGNOSIS — Z302 Encounter for sterilization: Secondary | ICD-10-CM

## 2018-08-17 DIAGNOSIS — R945 Abnormal results of liver function studies: Secondary | ICD-10-CM

## 2018-08-17 HISTORY — PX: VENTRAL HERNIA REPAIR: SHX424

## 2018-08-17 HISTORY — PX: LIVER BIOPSY: SHX301

## 2018-08-17 HISTORY — PX: TUBAL LIGATION: SHX77

## 2018-08-17 SURGERY — LIGATION, FALLOPIAN TUBE, BILATERAL
Anesthesia: General | Site: Abdomen

## 2018-08-17 MED ORDER — DEXAMETHASONE SODIUM PHOSPHATE 10 MG/ML IJ SOLN
INTRAMUSCULAR | Status: AC
  Filled 2018-08-17: qty 1

## 2018-08-17 MED ORDER — LACTATED RINGERS IV SOLN
INTRAVENOUS | Status: DC
  Administered 2018-08-17 (×2): via INTRAVENOUS

## 2018-08-17 MED ORDER — FENTANYL CITRATE (PF) 100 MCG/2ML IJ SOLN
INTRAMUSCULAR | Status: AC
  Filled 2018-08-17: qty 2

## 2018-08-17 MED ORDER — CHLORHEXIDINE GLUCONATE CLOTH 2 % EX PADS: 6.0000 | MEDICATED_PAD | Freq: Once | CUTANEOUS | Status: DC

## 2018-08-17 MED ORDER — ROCURONIUM BROMIDE 10 MG/ML (PF) SYRINGE
PREFILLED_SYRINGE | INTRAVENOUS | Status: AC
  Filled 2018-08-17: qty 10

## 2018-08-17 MED ORDER — ONDANSETRON HCL 4 MG/2ML IJ SOLN
INTRAMUSCULAR | Status: AC
  Filled 2018-08-17: qty 4

## 2018-08-17 MED ORDER — KETOROLAC TROMETHAMINE 30 MG/ML IJ SOLN
INTRAMUSCULAR | Status: AC
  Filled 2018-08-17: qty 1

## 2018-08-17 MED ORDER — LIDOCAINE 2% (20 MG/ML) 5 ML SYRINGE
INTRAMUSCULAR | Status: AC
  Filled 2018-08-17: qty 5

## 2018-08-17 MED ORDER — ONDANSETRON HCL 4 MG/2ML IJ SOLN
INTRAMUSCULAR | Status: AC
  Filled 2018-08-17: qty 2

## 2018-08-17 MED ORDER — ONDANSETRON HCL 4 MG/2ML IJ SOLN
INTRAMUSCULAR | Status: DC | PRN
  Administered 2018-08-17 (×2): 4 mg via INTRAVENOUS

## 2018-08-17 MED ORDER — HYDROMORPHONE HCL 1 MG/ML IJ SOLN
0.2500 mg | INTRAMUSCULAR | Status: DC | PRN
  Administered 2018-08-17 (×7): 0.5 mg via INTRAVENOUS
  Filled 2018-08-17 (×5): qty 0.5

## 2018-08-17 MED ORDER — MIDAZOLAM HCL 5 MG/5ML IJ SOLN
INTRAMUSCULAR | Status: DC | PRN
  Administered 2018-08-17: 2 mg via INTRAVENOUS

## 2018-08-17 MED ORDER — HEMOSTATIC AGENTS (NO CHARGE) OPTIME
TOPICAL | Status: DC | PRN
  Administered 2018-08-17: 1 via TOPICAL

## 2018-08-17 MED ORDER — MIDAZOLAM HCL 2 MG/2ML IJ SOLN
INTRAMUSCULAR | Status: AC
  Filled 2018-08-17: qty 2

## 2018-08-17 MED ORDER — CEFAZOLIN SODIUM-DEXTROSE 2-4 GM/100ML-% IV SOLN
2.0000 g | INTRAVENOUS | Status: AC
  Administered 2018-08-17: 2 g via INTRAVENOUS
  Administered 2018-08-17: 08:00:00 1 g via INTRAVENOUS
  Filled 2018-08-17: qty 100

## 2018-08-17 MED ORDER — BUPIVACAINE HCL (PF) 0.5 % IJ SOLN
INTRAMUSCULAR | Status: AC
  Filled 2018-08-17: qty 30

## 2018-08-17 MED ORDER — MIDAZOLAM HCL 2 MG/2ML IJ SOLN: 0.5000 mg | Freq: Once | INTRAMUSCULAR | Status: DC | PRN

## 2018-08-17 MED ORDER — 0.9 % SODIUM CHLORIDE (POUR BTL) OPTIME
TOPICAL | Status: DC | PRN
  Administered 2018-08-17 (×2): 1000 mL

## 2018-08-17 MED ORDER — BUPIVACAINE HCL (PF) 0.25 % IJ SOLN
INTRAMUSCULAR | Status: AC
  Filled 2018-08-17: qty 30

## 2018-08-17 MED ORDER — HYDROCODONE-ACETAMINOPHEN 5-325 MG PO TABS: 1.0000 | ORAL_TABLET | ORAL | 0 refills | Status: AC | PRN

## 2018-08-17 MED ORDER — PROPOFOL 10 MG/ML IV BOLUS
INTRAVENOUS | Status: DC | PRN
  Administered 2018-08-17: 150 mg via INTRAVENOUS

## 2018-08-17 MED ORDER — LIDOCAINE HCL (CARDIAC) PF 100 MG/5ML IV SOSY
PREFILLED_SYRINGE | INTRAVENOUS | Status: DC | PRN
  Administered 2018-08-17: 80 mg via INTRATRACHEAL

## 2018-08-17 MED ORDER — ROCURONIUM BROMIDE 10 MG/ML (PF) SYRINGE
PREFILLED_SYRINGE | INTRAVENOUS | Status: DC | PRN
  Administered 2018-08-17: 30 mg via INTRAVENOUS
  Administered 2018-08-17: 40 mg via INTRAVENOUS
  Administered 2018-08-17: 60 mg via INTRAVENOUS

## 2018-08-17 MED ORDER — HYDROCODONE-ACETAMINOPHEN 7.5-325 MG PO TABS
1.0000 | ORAL_TABLET | Freq: Once | ORAL | Status: AC | PRN
  Administered 2018-08-17: 1 via ORAL
  Filled 2018-08-17: qty 1

## 2018-08-17 MED ORDER — DEXAMETHASONE SODIUM PHOSPHATE 10 MG/ML IJ SOLN
INTRAMUSCULAR | Status: DC | PRN
  Administered 2018-08-17: 10 mg via INTRAVENOUS

## 2018-08-17 MED ORDER — BUPIVACAINE LIPOSOME 1.3 % IJ SUSP
INTRAMUSCULAR | Status: AC
  Filled 2018-08-17: qty 20

## 2018-08-17 MED ORDER — PROPOFOL 10 MG/ML IV BOLUS
INTRAVENOUS | Status: AC
  Filled 2018-08-17: qty 20

## 2018-08-17 MED ORDER — BUPIVACAINE LIPOSOME 1.3 % IJ SUSP
INTRAMUSCULAR | Status: DC | PRN
  Administered 2018-08-17: 20 mL

## 2018-08-17 MED ORDER — FENTANYL CITRATE (PF) 100 MCG/2ML IJ SOLN
INTRAMUSCULAR | Status: DC | PRN
  Administered 2018-08-17: 100 ug via INTRAVENOUS
  Administered 2018-08-17 (×5): 50 ug via INTRAVENOUS

## 2018-08-17 MED ORDER — SUGAMMADEX SODIUM 200 MG/2ML IV SOLN
INTRAVENOUS | Status: DC | PRN
  Administered 2018-08-17: 200 mg via INTRAVENOUS

## 2018-08-17 MED ORDER — CEFAZOLIN SODIUM-DEXTROSE 1-4 GM/50ML-% IV SOLN
INTRAVENOUS | Status: AC
  Filled 2018-08-17: qty 50

## 2018-08-17 MED ORDER — DOCUSATE SODIUM 100 MG PO CAPS: 100.0000 mg | ORAL_CAPSULE | Freq: Two times a day (BID) | ORAL | 2 refills | Status: AC

## 2018-08-17 MED ORDER — BUPIVACAINE-EPINEPHRINE (PF) 0.25% -1:200000 IJ SOLN
INTRAMUSCULAR | Status: AC
  Filled 2018-08-17: qty 30

## 2018-08-17 MED ORDER — BUPIVACAINE HCL (PF) 0.25 % IJ SOLN
INTRAMUSCULAR | Status: DC | PRN
  Administered 2018-08-17: 8 mL

## 2018-08-17 MED ORDER — KETOROLAC TROMETHAMINE 30 MG/ML IJ SOLN
30.0000 mg | Freq: Once | INTRAMUSCULAR | Status: AC
  Administered 2018-08-17: 30 mg via INTRAVENOUS

## 2018-08-17 MED ORDER — HYDROMORPHONE HCL 1 MG/ML IJ SOLN
INTRAMUSCULAR | Status: AC
  Filled 2018-08-17: qty 1

## 2018-08-17 MED ORDER — FENTANYL CITRATE (PF) 250 MCG/5ML IJ SOLN
INTRAMUSCULAR | Status: AC
  Filled 2018-08-17: qty 5

## 2018-08-17 SURGICAL SUPPLY — 73 items
APPLICATOR ARISTA FLEXITIP XL (MISCELLANEOUS) ×4 IMPLANT
BAG RETRIEVAL 10MM (BASKET) ×1
BANDAGE STRIP 1X3 FLEXIBLE (GAUZE/BANDAGES/DRESSINGS) ×8 IMPLANT
BLADE SURG 15 STRL LF DISP TIS (BLADE) ×2 IMPLANT
BLADE SURG 15 STRL SS (BLADE) ×2
BLADE SURG SZ11 CARB STEEL (BLADE) ×4 IMPLANT
CHLORAPREP W/TINT 26 (MISCELLANEOUS) ×4 IMPLANT
CLOSURE WOUND 1/4 X3 (GAUZE/BANDAGES/DRESSINGS) ×1
CLOTH BEACON ORANGE TIMEOUT ST (SAFETY) ×8 IMPLANT
COVER LIGHT HANDLE STERIS (MISCELLANEOUS) ×8 IMPLANT
DECANTER SPIKE VIAL GLASS SM (MISCELLANEOUS) ×4 IMPLANT
DERMABOND ADVANCED (GAUZE/BANDAGES/DRESSINGS) ×4
DERMABOND ADVANCED .7 DNX12 (GAUZE/BANDAGES/DRESSINGS) ×4 IMPLANT
DEVICE TROCAR PUNCTURE CLOSURE (ENDOMECHANICALS) ×4 IMPLANT
DRAPE UTILITY W/TAPE 26X15 (DRAPES) ×8 IMPLANT
DRSG TELFA 3X8 NADH (GAUZE/BANDAGES/DRESSINGS) ×4 IMPLANT
ELECT REM PT RETURN 9FT ADLT (ELECTROSURGICAL) ×4
ELECTRODE REM PT RTRN 9FT ADLT (ELECTROSURGICAL) ×2 IMPLANT
FILTER SMOKE EVAC LAPAROSHD (FILTER) ×4 IMPLANT
GAUZE 4X4 16PLY RFD (DISPOSABLE) ×4 IMPLANT
GLOVE BIO SURGEON STRL SZ 6.5 (GLOVE) ×3 IMPLANT
GLOVE BIO SURGEONS STRL SZ 6.5 (GLOVE) ×1
GLOVE BIOGEL PI IND STRL 6.5 (GLOVE) ×2 IMPLANT
GLOVE BIOGEL PI IND STRL 7.0 (GLOVE) ×6 IMPLANT
GLOVE BIOGEL PI IND STRL 9 (GLOVE) ×4 IMPLANT
GLOVE BIOGEL PI INDICATOR 6.5 (GLOVE) ×2
GLOVE BIOGEL PI INDICATOR 7.0 (GLOVE) ×6
GLOVE BIOGEL PI INDICATOR 9 (GLOVE) ×4
GLOVE ECLIPSE 9.0 STRL (GLOVE) ×8 IMPLANT
GLOVE SURG SS PI 7.5 STRL IVOR (GLOVE) ×4 IMPLANT
GOWN SPEC L3 XXLG W/TWL (GOWN DISPOSABLE) ×4 IMPLANT
GOWN STRL REUS W/TWL LRG LVL3 (GOWN DISPOSABLE) ×12 IMPLANT
HEMOSTAT ARISTA ABSORB 1G (HEMOSTASIS) ×4 IMPLANT
INST SET LAPROSCOPIC AP (KITS) ×4 IMPLANT
INST SET LAPROSCOPIC GYN AP (KITS) ×4 IMPLANT
KIT TURNOVER CYSTO (KITS) ×4 IMPLANT
KIT TURNOVER KIT A (KITS) ×4 IMPLANT
LIGASURE LAP ATLAS 10MM 37CM (INSTRUMENTS) ×4 IMPLANT
MANIFOLD NEPTUNE II (INSTRUMENTS) ×4 IMPLANT
MESH VENTRALIGHT ST 6X8 (Mesh Specialty) ×2 IMPLANT
MESH VENTRLGHT ELLIPSE 8X6XMFL (Mesh Specialty) ×2 IMPLANT
NEEDLE HYPO 22GX1.5 SAFETY (NEEDLE) ×4 IMPLANT
NEEDLE INSUFFLATION 14GA 120MM (NEEDLE) ×8 IMPLANT
NS IRRIG 1000ML POUR BTL (IV SOLUTION) ×8 IMPLANT
PACK LAP CHOLE LZT030E (CUSTOM PROCEDURE TRAY) ×4 IMPLANT
PACK PERI GYN (CUSTOM PROCEDURE TRAY) ×4 IMPLANT
PAD ARMBOARD 7.5X6 YLW CONV (MISCELLANEOUS) ×8 IMPLANT
PENCIL HANDSWITCHING (ELECTRODE) ×4 IMPLANT
RING FALLOPIAN BANDS (Ring) ×4 IMPLANT
SET BASIN LINEN APH (SET/KITS/TRAYS/PACK) ×4 IMPLANT
SOLUTION ANTI FOG 6CC (MISCELLANEOUS) ×8 IMPLANT
STRIP CLOSURE SKIN 1/4X3 (GAUZE/BANDAGES/DRESSINGS) ×3 IMPLANT
SUT MNCRL AB 4-0 PS2 18 (SUTURE) ×12 IMPLANT
SUT NOVA NAB GS-22 2 2-0 T-19 (SUTURE) ×4 IMPLANT
SUT PROLENE 2 0 CR (SUTURE) ×4 IMPLANT
SUT VIC AB 4-0 PS2 27 (SUTURE) ×4 IMPLANT
SUT VICRYL 0 UR6 27IN ABS (SUTURE) ×8 IMPLANT
SYR 10ML LL (SYRINGE) ×4 IMPLANT
SYR 20CC LL (SYRINGE) ×4 IMPLANT
SYR BULB IRRIGATION 50ML (SYRINGE) ×4 IMPLANT
SYR CONTROL 10ML LL (SYRINGE) ×4 IMPLANT
SYS BAG RETRIEVAL 10MM (BASKET) ×3
SYSTEM BAG RETRIEVAL 10MM (BASKET) ×2 IMPLANT
TACKER 5MM HERNIA 3.5CML NAB (ENDOMECHANICALS) ×8 IMPLANT
TRAY FOLEY MTR SLVR 16FR STAT (SET/KITS/TRAYS/PACK) ×4 IMPLANT
TROCAR ENDO BLADELESS 11MM (ENDOMECHANICALS) ×4 IMPLANT
TROCAR XCEL NON BLADE 8MM B8LT (ENDOMECHANICALS) ×4 IMPLANT
TROCAR XCEL NON-BLD 5MMX100MML (ENDOMECHANICALS) ×8 IMPLANT
TROCAR XCEL UNIV SLVE 11M 100M (ENDOMECHANICALS) ×12 IMPLANT
TUBING HI FLO HEAT INSUFFLATOR (IRRIGATION / IRRIGATOR) ×4 IMPLANT
TUBING INSUFFLATION (TUBING) ×4 IMPLANT
WARMER LAPAROSCOPE (MISCELLANEOUS) ×8 IMPLANT
YANKAUER SUCT BULB TIP 10FT TU (MISCELLANEOUS) ×4 IMPLANT

## 2018-08-17 NOTE — Discharge Instructions (Signed)
Discharge Laparoscopic Surgery Instructions:  Breastfeeding:  Ok to breastfeed on Norco, but pump and dump the first time after anesthesia.   Common Complaints: Right shoulder pain is common after laparoscopic surgery.  This is secondary to the gas used in the surgery being trapped under the diaphragm.  Pain at the incisions are also common and under your belly where the mesh was placed.  Walk to help your body absorb the gas. This will improve in a few days. Pain at the port sites are common, especially the larger port sites. This will improve with time.  Some nausea is common and poor appetite. The main goal is to stay hydrated the first few days after surgery.   Diet/ Activity: Diet as tolerated. You may not have an appetite, but it is important to stay hydrated. Drink 64 ounces of water a day. Your appetite will return with time.  Shower per your regular routine daily.  Do not take hot showers. Take warm showers that are less than 10 minutes. Rest and listen to your body, but do not remain in bed all day.  Walk everyday for at least 15-20 minutes. Deep cough and move around every 1-2 hours in the first few days after surgery.  Wear an abdominal binder daily with activity. You do not have to wear this while sleeping or sitting.  Do not lift > 10 lbs, perform excessive bending, pushing, pulling, squatting for 6-8 weeks after surgery.  The activity restrictions and the abdominal binder are to prevent hernia formation at your incision while you are healing.  Do not place lotions or balms on your incision unless instructed to specifically by Dr. Henreitta LeberBridges.  Do not pick at the dermabond glue on your incision sites.  This glue film will remain in place for 1-2 weeks and will start to peel off.    Medication: Take Ibuprofen 800mg  every 6 hours for pain control Take Norco 1-2 tablets for breakthrough pain.  There is Tylenol in Norco, so do not exceed 4000mg  (4 grams) of tylenol in a day.  Take  Colace for constipation related to narcotic pain medication. If you do not have a bowel movement in 2 days, take Miralax over the counter.  Drink plenty of water to also prevent constipation.   Contact Information: If you have questions or concerns, please call our office, [phone], Monday- Thursday 8AM-5PM and Friday 8AM-12Noon.  If it is after hours or on the weekend, please call [facility], [phone], and ask to speak to the surgeon on call for Dr. Henre[facility]ridges at [facility].    Laparoscopic Ventral Hernia Repair, Care After This sheet gives you information about how to care for yourself after your procedure. Your health care provider may also give you more specific instructions. If you have problems or questions, contact your health care provider. What can I expect after the procedure? After the procedure, it is common to have:  Pain, discomfort, or soreness. Follow these instructions at home: Incision care   Follow instructions from your health care provider about how to take care of your incision. Make sure you: ? Wash your hands with soap and water before you change your bandage (dressing) or before you touch your abdomen. If soap and water are not available, use hand sanitizer. ? Change your dressing as told by your health care provider. ? Leave stitches (sutures), skin glue, or adhesive strips in place. These skin closures may need to stay in place for 2 weeks or longer. If  adhesive strip edges start to loosen and curl up, you may trim the loose edges. Do not remove adhesive strips completely unless your health care provider tells you to do that.  Check your incision area every day for signs of infection. Check for: ? Redness, swelling, or pain. ? Fluid or blood. ? Warmth. ? Pus or a bad smell. Bathing   Do not take baths, swim, or use a hot tub until your health care provider approves.  You may shower. Activity  Do not lift anything that is heavier than 10 lb  (4.5 kg) until your health care provider approves.  Do not drive or use heavy machinery while taking prescription pain medicine. Ask your health care provider when it is safe for you to drive or use heavy machinery.  Do not drive for 24 hours if you were given a medicine to help you relax (sedative) during your procedure.  Rest as told by your health care provider. You may return to your normal activities when your health care provider approves. General instructions  Take over-the-counter and prescription medicines only as told by your health care provider.  To prevent or treat constipation while you are taking prescription pain medicine, your health care provider may recommend that you: ? Take over-the-counter or prescription medicines. ? Eat foods that are high in fiber, such as fresh fruits and vegetables, whole grains, and beans. ? Limit foods that are high in fat and processed sugars, such as fried and sweet foods.  Drink enough fluid to keep your urine clear or pale yellow.  Hold a pillow over your abdomen when you cough or sneeze. This helps with pain.  Keep all follow-up visits as told by your health care provider. This is important. Contact a health care provider if:  You have: ? A fever or chills. ? Redness, swelling, or pain around your incision. ? Fluid or blood coming from your incision. ? Pus or a bad smell coming from your incision. ? Pain that gets worse or does not get better with medicine. ? Nausea or vomiting. ? A cough. ? Shortness of breath.  Your incision feels warm to the touch.  You have not had a bowel movement in three days.  You are not able to urinate. Get help right away if:  You have severe pain in your abdomen.  You have persistent nausea and vomiting.  You have redness, warmth, or pain in your leg.  You have chest pain.  You have trouble breathing. Summary  After this procedure, it is common to have pain, discomfort, or  soreness.  Follow instructions from your health care provider about how to take care of your incision.  Check your incision area every day for signs of infection. Report any signs of infection to your health care provider.  Keep all follow-up visits as told by your health care provider. This is important. This information is not intended to replace advice given to you by your health care provider. Make sure you discuss any questions you have with your health care provider. Document Released: 01/15/2012 Document Revised: 01/10/2017 Document Reviewed: 09/20/2015 Elsevier Patient Education  2020 [place] Anesthesia, Adult, Care After This sheet gives you information about how to care for yourself after your procedure. Your health care provider may also give you more specific instructions. If you have problems or questions, contact your health care provider. What can I expect after the procedure? After the procedure, the following side  effects are common:  Pain or discomfort at the IV site.  Nausea.  Vomiting.  Sore throat.  Trouble concentrating.  Feeling cold or chills.  Weak or tired.  Sleepiness and fatigue.  Soreness and body aches. These side effects can affect parts of the body that were not involved in surgery. Follow these instructions at home:  For at least 24 hours after the procedure:  Have a responsible adult stay with you. It is important to have someone help care for you until you are awake and alert.  Rest as needed.  Do not: ? Participate in activities in which you could fall or become injured. ? Drive. ? Use heavy machinery. ? Drink alcohol. ? Take sleeping pills or medicines that cause drowsiness. ? Make important decisions or sign legal documents. ? Take care of children on your own. Eating and drinking  Follow any instructions from your health care provider about eating or drinking restrictions.  When you feel hungry,  start by eating small amounts of foods that are soft and easy to digest (bland), such as toast. Gradually return to your regular diet.  Drink enough fluid to keep your urine pale yellow.  If you vomit, rehydrate by drinking water, juice, or clear broth. General instructions  If you have sleep apnea, surgery and certain medicines can increase your risk for breathing problems. Follow instructions from your health care provider about wearing your sleep device: ? Anytime you are sleeping, including during daytime naps. ? While taking prescription pain medicines, sleeping medicines, or medicines that make you drowsy.  Return to your normal activities as told by your health care provider. Ask your health care provider what activities are safe for you.  Take over-the-counter and prescription medicines only as told by your health care provider.  If you smoke, do not smoke without supervision.  Keep all follow-up visits as told by your health care provider. This is important. Contact a health care provider if:  You have nausea or vomiting that does not get better with medicine.  You cannot eat or drink without vomiting.  You have pain that does not get better with medicine.  You are unable to pass urine.  You develop a skin rash.  You have a fever.  You have redness around your IV site that gets worse. Get help right away if:  You have difficulty breathing.  You have chest pain.  You have blood in your urine or stool, or you vomit blood. Summary  After the procedure, it is common to have a sore throat or nausea. It is also common to feel tired.  Have a responsible adult stay with you for the first 24 hours after general anesthesia. It is important to have someone help care for you until you are awake and alert.  When you feel hungry, start by eating small amounts of foods that are soft and easy to digest (bland), such as toast. Gradually return to your regular diet.  Drink  enough fluid to keep your urine pale yellow.  Return to your normal activities as told by your health care provider. Ask your health care provider what activities are safe for you. This information is not intended to replace advice given to you by your health care provider. Make sure you discuss any questions you have with your health care provider. Document Released: 05/06/2000 Document Revised: 01/31/2017 Document Reviewed: 09/13/2016 Elsevier Patient Education  2020 [employer]     Laparoscopic Tubal Ligation, Care After This sheet gives  you information about how to care for yourself after your procedure. Your health care provider may also give you more specific instructions. If you have problems or questions, contact your health care provider. What can I expect after the procedure? After the procedure, it is common to have:  A sore throat.  Discomfort in your shoulder.  Mild discomfort or cramping in your abdomen.  Gas pains.  Pain or soreness in the area where the surgical incision was made.  A bloated feeling.  Tiredness.  Nausea.  Vomiting. Follow these instructions at home: Medicines  Take over-the-counter and prescription medicines only as told by your health care provider.  Do not take aspirin because it can cause bleeding.  Ask your health care provider if the medicine prescribed to you: ? Requires you to avoid driving or using heavy machinery. ? Can cause constipation. You may need to take actions to prevent or treat constipation, such as:  Drink enough fluid to keep your urine pale yellow.  Take over-the-counter or prescription medicines.  Eat foods that are high in fiber, such as beans, whole grains, and fresh fruits and vegetables.  Limit foods that are high in fat and processed sugars, such as fried or sweet foods. Incision care      Follow instructions from your health care provider about how to take care of your incision. Make sure  you: ? Wash your hands with soap and water before and after you change your bandage (dressing). If soap and water are not available, use hand sanitizer. ? Change your dressing as told by your health care provider. ? Leave stitches (sutures), skin glue, or adhesive strips in place. These skin closures may need to stay in place for 2 weeks or longer. If adhesive strip edges start to loosen and curl up, you may trim the loose edges. Do not remove adhesive strips completely unless your health care provider tells you to do that.  Check your incision area every day for signs of infection. Check for: ? Redness, swelling, or pain. ? Fluid or blood. ? Warmth. ? Pus or a bad smell. Activity  Rest as told by your health care provider.  Avoid sitting for a long time without moving. Get up to take short walks every 1-2 hours. This is important to improve blood flow and breathing. Ask for help if you feel weak or unsteady.  Return to your normal activities as told by your health care provider. Ask your health care provider what activities are safe for you. General instructions  Do not take baths, swim, or use a hot tub until your health care provider approves. Ask your health care provider if you may take showers. You may only be allowed to take sponge baths.  Have someone help you with your daily household tasks for the first few days.  Keep all follow-up visits as told by your health care provider. This is important. Contact a health care provider if:  You have redness, swelling, or pain around your incision.  Your incision feels warm to the touch.  You have pus or a bad smell coming from your incision.  The edges of your incision break open after the sutures have been removed.  Your pain does not improve after 2-3 days.  You have a rash.  You repeatedly become dizzy or light-headed.  Your pain medicine is not helping. Get help right away if you:  Have a fever.  Faint.  Have  increasing pain in your abdomen.  Have severe  pain in one or both of your shoulders.  Have fluid or blood coming from your sutures or from your vagina.  Have shortness of breath or difficulty breathing.  Have chest pain or leg pain.  Have ongoing nausea, vomiting, or diarrhea. Summary  After the procedure, it is common to have mild discomfort or cramping in your abdomen.  Take over-the-counter and prescription medicines only as told by your health care provider.  Watch for symptoms that should prompt you to call your health care provider.  Keep all follow-up visits as told by your health care provider. This is important. This information is not intended to replace advice given to you by your health care provider. Make sure you discuss any questions you have with your health care provider. Document Released: 08/17/2004 Document Revised: 12/23/2017 Document Reviewed: 12/23/2017 Elsevier Patient Education  2020 [employer]     Liver Biopsy, Care After These instructions give you information on caring for yourself after your procedure. Your doctor may also give you more specific instructions. Call your doctor if you have any problems or questions after your procedure. What can I expect after the procedure? After the procedure, it is common to have:  Pain and soreness where the biopsy was done.  Bruising around the area where the biopsy was done.  Sleepiness and be tired for a few days. Follow these instructions at home: Medicines  Take over-the-counter and prescription medicines only as told by your doctor.  If you were prescribed an antibiotic medicine, take it as told by your doctor. Do not stop taking the antibiotic even if you start to feel better.  Do not take medicines such as aspirin and ibuprofen. These medicines can thin your blood. Do not take these medicines unless your doctor tells you to take them.  If you are taking prescription pain medicine, take actions  to prevent or treat constipation. Your doctor may recommend that you: ? Drink enough fluid to keep your pee (urine) clear or pale yellow. ? Take over-the-counter or prescription medicines. ? Eat foods that are high in fiber, such as fresh fruits and vegetables, whole grains, and beans. ? Limit foods that are high in fat and processed sugars, such as fried and sweet foods. Caring for your cut  Follow instructions from your doctor about how to take care of your cuts from surgery (incisions). Make sure you: ? Wash your hands with soap and water before you change your bandage (dressing). If you cannot use soap and water, use hand sanitizer. ? Change your bandage as told by your doctor. ? Leave stitches (sutures), skin glue, or skin tape (adhesive) strips in place. They may need to stay in place for 2 weeks or longer. If tape strips get loose and curl up, you may trim the loose edges. Do not remove tape strips completely unless your doctor says it is okay.  Check your cuts every day for signs of infection. Check for: ? Redness, swelling, or more pain. ? Fluid or blood. ? Pus or a bad smell. ? Warmth.  Do not take baths, swim, or use a hot tub until your doctor says it is okay to do so. Activity   Rest at home for 1-2 days or as told by your doctor. ? Avoid sitting for a long time without moving. Get up to take short walks every 1-2 hours.  Return to your normal activities as told by your doctor. Ask what activities are safe for you.  Do not do these  things in the first 24 hours: ? Drive. ? Use machinery. ? Take a bath or shower.  Do not lift more than 10 pounds (4.5 kg) or play contact sports for the first 2 weeks. General instructions   Do not drink alcohol in the first week after the procedure.  Have someone stay with you for at least 24 hours after the procedure.  Get your test results. Ask your doctor or the department that is doing the test: ? When will my results be  ready? ? How will I get my results? ? What are my treatment options? ? What other tests do I need? ? What are my next steps?  Keep all follow-up visits as told by your doctor. This is important. Contact a doctor if:  A cut bleeds and leaves more than just a small spot of blood.  A cut is red, puffs up (swells), or hurts more than before.  Fluid or something else comes from a cut.  A cut smells bad.  You have a fever or chills. Get help right away if:  You have swelling, bloating, or pain in your belly (abdomen).  You get dizzy or faint.  You have a rash.  You feel sick to your stomach (nauseous) or throw up (vomit).  You have trouble breathing, feel short of breath, or feel faint.  Your chest hurts.  You have problems talking or seeing.  You have trouble with your balance or moving your arms or legs. Summary  After the procedure, it is common to have pain, soreness, bruising, and tiredness.  Your doctor will tell you how to take care of yourself at home. Change your bandage, take your medicines, and limit your activities as told by your doctor.  Call your doctor if you have symptoms of infection. Get help right away if your belly swells, your cut bleeds a lot, or you have trouble talking or breathing. This information is not intended to replace advice given to you by your health care provider. Make sure you discuss any questions you have with your health care provider. Document Released: 11/07/2007 Document Revised: 02/07/2017 Document Reviewed: 02/07/2017 Elsevier Patient Education  2020 [employer].

## 2018-08-17 NOTE — H&P (Signed)
[facility] History and Physical  [name] is a 33 y.o. female.  HPI: [name] is a 33 yo with a supraumbilical hernia that we had planned during fix in March but due to COVID this was postponed. She is postpartum 6 months, and first noticed the hernia after her twins were born 8 years ago and then noted it getting larger and more tender during this pregnancy. She notes pain with palpation and with movement or bending. She had part of her colon in the hernia and on CT the cecum was thickened. Dr. Karilyn Cotaehman did evaluate her prior and did biospies of the colon without any abnormalities noted. She denied any obstructive symptoms. The patient has also had longer term LFT elevations and continues to have this elevation.  The LFTs including transaminase and alkaline phosphatase are elevated but the T bili is normal.    We have been asked to do a liver biopsy at the time of her hernia repair.  She has no RUQ pain or pain with eating to make us suspect gallbladder at this time.   She is undergoing a tubal ligation with Dr. Emelda FearFerguson today also.   Past Medical History:  Diagnosis Date  . Breast nodule 10/10/2014  . Breast pain 10/10/2014  . BV (bacterial vaginosis) 11/12/2012  . Depression   . Fatty liver   . Hypothyroid 02/14/2015  . Irregular menses 05/21/2013  . Migraines   . Obesity   . Other and unspecified ovarian cyst 05/31/2013   Right complex ?cystic vs solid mass on ovary will schedule appt with JVF  . Pregnant 02/14/2015  . Thyroid disease    Reported Hashimoto's Thyroiditis in past    Past Surgical History:  Procedure Laterality Date  . BIOPSY  04/17/2018   Procedure: BIOPSY;  Surgeon: Malissa Hippoehman, Najeeb U, MD;  Location: [facility];  Service: Endoscopy;;  colon  . CESAREAN SECTION    . COLONOSCOPY    . COLONOSCOPY WITH PROPOFOL N/A 04/17/2018   Procedure: COLONOSCOPY WITH PROPOFOL;  Surgeon: Malissa Hippoehman, Najeeb U, MD;  Location: [facility];  Service: Endoscopy;  Laterality:  N/A;  10:50  . OOPHORECTOMY Right   . TONSILLECTOMY    . WISDOM TOOTH EXTRACTION      Family History  Problem Relation Age of Onset  . Diabetes Maternal Grandmother   . Hypertension Maternal Grandmother   . Cancer Maternal Grandmother        breast  . Diabetes Maternal Grandfather   . Hypertension Maternal Grandfather   . Leukemia Maternal Grandfather   . Diabetes Mother   . Hypertension Mother   . Heart attack Mother   . Stroke Mother   . Kidney disease Paternal Grandfather   . Seizures Father   . Other Father        back problems  . Dementia Paternal Grandmother   . Cancer Maternal Aunt   . Diabetes Maternal Uncle   . Colon cancer Cousin   . Stomach cancer Other   . Ovarian cancer Cousin   . Ulcerative colitis Neg Hx   . Crohn's disease Neg Hx     Social History   Tobacco Use  . Smoking status: Former Smoker    Packs/day: 0.25    Years: 8.00    Pack years: 2.00    Types: Cigarettes    Quit date: 10/07/2010    Years since quitting: 7.8  . Smokeless tobacco: Never Used  Substance Use Topics  . Alcohol use: No  Comment: rare  . Drug use: No    Medications: I have reviewed the patient's current medications. Current Facility-Administered Medications  Medication Dose Route Frequency Provider Last Rate Last Dose  . ceFAZolin (ANCEF) IVPB 2g/100 mL premix  2 g Intravenous On Call to OR Tilda BurrowFerguson, John V, MD      . Chlorhexidine Gluconate Cloth 2 % PADS 6 each  6 each Topical Once Lucretia RoersBridges, Savreen Gebhardt C, MD       And  . Chlorhexidine Gluconate Cloth 2 % PADS 6 each  6 each Topical Once Lucretia RoersBridges, Daelyn Mozer C, MD      . lactated ringers infusion   Intravenous Continuous Dorena CookeyNabonsal, Jeff S, MD 50 mL/hr at 08/17/18 0703     Medications Prior to Admission  Medication Sig Dispense Refill Last Dose  . levothyroxine (SYNTHROID, LEVOTHROID) 150 MCG tablet Take 1 tablet (150 mcg total) by mouth daily before breakfast. 30 tablet 3 08/17/2018 at Unknown time  . Prenatal Vit-Fe  Fumarate-FA (PRENATAL MULTIVITAMIN) TABS tablet Take 1 tablet by mouth daily.   08/16/2018 at Unknown time    Allergies  Allergen Reactions  . Imitrex [Sumatriptan]     Makes migraines worse.   . Maxalt [Rizatriptan]     Makes migraines worse  . Phenergan [Promethazine Hcl] Nausea And Vomiting    Pill form; IV ok  . Tape Other (See Comments)    Blisters on abdomen after Wiley Section    ROS:  A comprehensive review of systems was negative except for: Gastrointestinal: positive for abdominal pain  Blood pressure (P) 120/84, pulse (P) 77, temperature (P) 98 F (36.7 Wiley), temperature source (P) Oral, resp. rate (P) 18, last menstrual period 07/30/2018, SpO2 (P) 97 %, currently breastfeeding. Physical Exam Vitals signs reviewed.  Constitutional:      Appearance: Normal appearance.  HENT:     Head: Normocephalic.     Nose: Nose normal.     Mouth/Throat:     Mouth: Mucous membranes are moist.  Eyes:     Pupils: Pupils are equal, round, and reactive to light.  Neck:     Musculoskeletal: Normal range of motion.  Cardiovascular:     Rate and Rhythm: Normal rate.  Pulmonary:     Effort: Pulmonary effort is normal.  Abdominal:     General: There is no distension.     Palpations: Abdomen is soft.     Tenderness: There is abdominal tenderness.     Hernia: A hernia is present.     Comments: Supraumbilical hernia, tender over  Musculoskeletal: Normal range of motion.        General: No swelling.  Skin:    General: Skin is warm and dry.  Neurological:     General: No focal deficit present.     Mental Status: She is alert and oriented to person, place, and time.  Psychiatric:        Mood and Affect: Mood normal.        Behavior: Behavior normal.        Thought Content: Thought content normal.        Judgment: Judgment normal.   Labs:  Results for [name], [name] (MRN [id number]) as of 08/17/2018 07:16  Ref. Range 08/13/2018 13:26  Sodium Latest Ref Range: 135 - 145 mmol/L 136   Potassium Latest Ref Range: 3.5 - 5.1 mmol/L 3.8  Chloride Latest Ref Range: 98 - 111 mmol/L 104  CO2 Latest Ref Range: 22 - 32 mmol/L 22  Glucose Latest Ref Range:  70 - 99 mg/dL 79  BUN Latest Ref Range: 6 - 20 mg/dL 13  Creatinine Latest Ref Range: 0.44 - 1.00 mg/dL 0.50  Calcium Latest Ref Range: 8.9 - 10.3 mg/dL 8.7 (L)  Anion gap Latest Ref Range: 5 - 15  10  Alkaline Phosphatase Latest Ref Range: 38 - 126 U/L 683 (H)  Albumin Latest Ref Range: 3.5 - 5.0 g/dL 4.0  AST Latest Ref Range: 15 - 41 U/L 171 (H)  ALT Latest Ref Range: 0 - 44 U/L 172 (H)  Total Protein Latest Ref Range: 6.5 - 8.1 g/dL 7.7  Total Bilirubin Latest Ref Range: 0.3 - 1.2 mg/dL 0.6  GFR, Est Non African American Latest Ref Range: >60 mL/min >60  GFR, Est African American Latest Ref Range: >60 mL/min >60    Results: CT a/p reviewed- supraumbilical hernia with 2X9BZ fascial defect, larger on the horizontal axis, also with a small umbilical defect 1.6RC, colon in the supraumbilical defect; gallbladder appears normal   Assessment & Plan:  [name] is a 33 y.o. female with a supraumbilical hernia and a small umbilical hernia too. She is undergoing tubal ligation with Dr. Glo Herring at the same times as a liver biopsy and hernia repair with me.  We have discussed the risk of bleeding, infection, injury to the other organs, use of mesh and reasons for aborting mesh repair, and the patient has agreed to proceed. We have discussed post operative lifting restrictions, and the plan to pump and dump today. She is still breastfeeding and we will prescribe her Norco.   All questions were answered to the satisfaction of the patient. She has undergone COVID testing and is negative.    [name] 08/17/2018, 7:14 AM

## 2018-08-17 NOTE — Anesthesia Postprocedure Evaluation (Signed)
Anesthesia Post Note  Patient: EVOLETH NORDMEYER  Procedure(s) Performed: LAPAROSCOPIC BILATERAL TUBAL LIGATION WITH FALLOPE RINGS (Bilateral Abdomen) LAPAROSCOPIC VENTRAL HERNIA WITH MESH (N/A Abdomen) LIVER BIOPSY (N/A Abdomen)  Patient location during evaluation: PACU Anesthesia Type: General Level of consciousness: awake and alert and oriented Pain management: pain level controlled Vital Signs Assessment: post-procedure vital signs reviewed and stable Respiratory status: spontaneous breathing and nonlabored ventilation Cardiovascular status: blood pressure returned to baseline and stable Postop Assessment: no headache, no backache, adequate PO intake and no apparent nausea or vomiting Anesthetic complications: yes     Last Vitals:  Vitals:   08/17/18 1030 08/17/18 1032  BP: 128/90 128/90  Pulse:  68  Resp: 20   Temp: 36.9 C 37.4 C  SpO2: 100% 100%    Last Pain:  Vitals:   08/17/18 1032  TempSrc: Oral  PainSc:                  Yarden Manuelito

## 2018-08-17 NOTE — Anesthesia Preprocedure Evaluation (Signed)
Anesthesia Evaluation  Patient identified by MRN, date of birth, ID band Patient awake    Reviewed: Allergy & Precautions, NPO status , Patient's Chart, lab work & pertinent test results  Airway Mallampati: III  TM Distance: >3 FB Neck ROM: Full    Dental no notable dental hx. (+) Teeth Intact   Pulmonary neg pulmonary ROS, former smoker,    Pulmonary exam normal breath sounds clear to auscultation       Cardiovascular Exercise Tolerance: Good negative cardio ROS Normal cardiovascular examI Rhythm:Regular Rate:Normal     Neuro/Psych  Headaches, Anxiety Depression negative psych ROS   GI/Hepatic negative GI ROS, Neg liver ROS,   Endo/Other  Hypothyroidism Morbid obesityBMI >43 Denies OSA -never tested  Snores   Renal/GU negative Renal ROS  negative genitourinary   Musculoskeletal negative musculoskeletal ROS (+)   Abdominal   Peds negative pediatric ROS (+)  Hematology negative hematology ROS (+)   Anesthesia Other Findings   Reproductive/Obstetrics negative OB ROS                             Anesthesia Physical Anesthesia Plan  ASA: III  Anesthesia Plan: General   Post-op Pain Management:    Induction: Intravenous  PONV Risk Score and Plan: 3 and Ondansetron, Dexamethasone, Treatment may vary due to age or medical condition and Midazolam  Airway Management Planned: Oral ETT  Additional Equipment:   Intra-op Plan:   Post-operative Plan: Extubation in OR  Informed Consent: I have reviewed the patients History and Physical, chart, labs and discussed the procedure including the risks, benefits and alternatives for the proposed anesthesia with the patient or authorized representative who has indicated his/her understanding and acceptance.     Dental advisory given  Plan Discussed with: CRNA  Anesthesia Plan Comments: (Plan Full PPE use  Plan GETA-d/w pt -WTP with same  after Q&A, Tabitha CRNA present )        Anesthesia Quick Evaluation

## 2018-08-17 NOTE — Brief Op Note (Signed)
08/17/2018  10:17 AM  PATIENT:  [name]  33 y.o. female  PRE-OPERATIVE DIAGNOSIS:  sterilization, ventral hernia, elevated liver function test  POST-OPERATIVE DIAGNOSIS:  sterilization, ventral hernia, elevated liver function test  PROCEDURE:  Procedure(s): LAPAROSCOPIC BILATERAL TUBAL LIGATION WITH FALLOPE RINGS (Bilateral) LAPAROSCOPIC VENTRAL HERNIA WITH MESH (N/A) LIVER BIOPSY (N/A)  SURGEON:  Surgeon(s) and Role: Panel 1:    Jonnie Kind, MD - Primary Panel 2:    * Virl Cagey, MD - Primary  PHYSICIAN ASSISTANT:  Dr. Mallory Shirk, the remainder physician assistant for the ventral hernia repair with mesh, and liver biopsy, as needed due to the complexity of the case and access requiring bilateral ability to perform procedures such as tacking mesh. ASSISTANTS: Bonney Roussel, CST  ANESTHESIA:   local and general Exparel use periumbilical and around larger trocar sites EBL:  75 mL   BLOOD ADMINISTERED:none  DRAINS: none   LOCAL MEDICATIONS USED:  OTHER Exparel 1 syringe  SPECIMEN:  Source of Specimen:  Not applicable  DISPOSITION OF SPECIMEN:  N/A  COUNTS:  YES  TOURNIQUET:  * No tourniquets in log *  DICTATION: .Dragon Dictation  PLAN OF CARE: Discharge to home after PACU  PATIENT DISPOSITION:  PACU - hemodynamically stable.   Delay start of Pharmacological VTE agent (>24hrs) due to surgical blood loss or risk of bleeding: not applicable Details of procedure: Patient was taken the operating room prepped and draped for abdominal and vaginal combined procedure with legs in low lithotomy support, Foley catheter in place, timeout conducted and procedure confirmed by operative team to identify both Dr. Glo Herring and Dr. Constance Haw in their intended procedure.. Abdominal palpation reveals a large ventral hernia located approximately 5 cm above the umbilicus the umbilicus had a small 1 cm central umbilical hernia.  A midline vertical incision was made  over the umbilicus, identifying the hernia sac which could be opened using a modified Hassan technique, placement of trocar sleeve, tying the hernia sac and a pursestring suture around the cannula and insufflating the abdomen without difficulty.  Inspection showed that the periumbilical area was clear and there was no evidence of bowel injury associated with intraperitoneal trocar insertion.  Attention was directed suprapubic area where an 8 mm trocar was placed and attention then directed to the fallopian tubes.  With the abdomen insufflated we were able to identify each fallopian tube in place a Falope ring on the midportion of each fallopian tube, with Marcaine injected percutaneously with a 3 inch spinal needle into the incarcerated knuckle of fallopian tube on each side, and into the mesosalpinx beneath the right tube. The trochars were left in place so that Dr. Constance Haw could be adherent begin her portion of the case fully seated Dr.Bridges notes for further details of her case. At one point during the hernia repair we interrupted the proceedings long enough to close the hernia defect, this was done after the omentum abdomen freed up and the mesh was ready to be placed.  At that point I placed a Allis clamp and a Coker clamp on the lateral edges of the umbilical hernia site and used running nonabsorbable 2-0 to close the umbilical hernia site which had been used for the umbilical access.  We then returned the case to Dr. Constance Haw and she completed the case as described in her notes elsewhere.

## 2018-08-17 NOTE — H&P (Signed)
Lisa GoodnightKatelyn C Wiley is an 33 y.o. female. She is admitted thru Day surgery for laparoscopic tubal sterilization by fallope rings, to be done concurrently with Dr Henreitta LeberBridges mesh repair. She had her LMP 2 wk ago, is using condoms, has signed necessary medicaid consents, and preg test is negative. Open laparoscopy is planned due to the epigastric  hernia with bowel involvement. Risks of bowel injury, or other possible surgical complications , including tubal failure have been reviewed and pt questions encouraged and answered to patient satisfaction.   Pertinent Gynecological History: Menses: flow is moderate Bleeding:  Contraception: condoms DES exposure: unknown Blood transfusions: none Sexually transmitted diseases: no past history Previous GYN Procedures:   Last mammogram:  Date:  Last pap: normal Date:  OB History: G3P3004   Menstrual History: Menarche age:  Patient's last menstrual period was 07/30/2018.    Past Medical History:  Diagnosis Date  . Breast nodule 10/10/2014  . Breast pain 10/10/2014  . BV (bacterial vaginosis) 11/12/2012  . Depression   . Fatty liver   . Hypothyroid 02/14/2015  . Irregular menses 05/21/2013  . Migraines   . Obesity   . Other and unspecified ovarian cyst 05/31/2013   Right complex ?cystic vs solid mass on ovary will schedule appt with JVF  . Pregnant 02/14/2015  . Thyroid disease    Reported Hashimoto's Thyroiditis in past    Past Surgical History:  Procedure Laterality Date  . BIOPSY  04/17/2018   Procedure: BIOPSY;  Surgeon: Malissa Hippoehman, Najeeb U, MD;  Location: [facility];  Service: Endoscopy;;  colon  . CESAREAN SECTION    . COLONOSCOPY    . COLONOSCOPY WITH PROPOFOL N/A 04/17/2018   Procedure: COLONOSCOPY WITH PROPOFOL;  Surgeon: Malissa Hippoehman, Najeeb U, MD;  Location: [facility];  Service: Endoscopy;  Laterality: N/A;  10:50  . OOPHORECTOMY Right   . TONSILLECTOMY    . WISDOM TOOTH EXTRACTION      Family History  Problem Relation Age of Onset  .  Diabetes Maternal Grandmother   . Hypertension Maternal Grandmother   . Cancer Maternal Grandmother        breast  . Diabetes Maternal Grandfather   . Hypertension Maternal Grandfather   . Leukemia Maternal Grandfather   . Diabetes Mother   . Hypertension Mother   . Heart attack Mother   . Stroke Mother   . Kidney disease Paternal Grandfather   . Seizures Father   . Other Father        back problems  . Dementia Paternal Grandmother   . Cancer Maternal Aunt   . Diabetes Maternal Uncle   . Colon cancer Cousin   . Stomach cancer Other   . Ovarian cancer Cousin   . Ulcerative colitis Neg Hx   . Crohn's disease Neg Hx     Social History:  reports that she quit smoking about 7 years ago. Her smoking use included cigarettes. She has a 2.00 pack-year smoking history. She has never used smokeless tobacco. She reports that she does not drink alcohol or use drugs.  Allergies:  Allergies  Allergen Reactions  . Imitrex [Sumatriptan]     Makes migraines worse.   . Maxalt [Rizatriptan]     Makes migraines worse  . Phenergan [Promethazine Hcl] Nausea And Vomiting    Pill form; IV ok  . Tape Other (See Comments)    Blisters on abdomen after C Section    Medications Prior to Admission  Medication Sig Dispense Refill Last Dose  .  levothyroxine (SYNTHROID, LEVOTHROID) 150 MCG tablet Take 1 tablet (150 mcg total) by mouth daily before breakfast. 30 tablet 3 08/17/2018 at Unknown time  . Prenatal Vit-Fe Fumarate-FA (PRENATAL MULTIVITAMIN) TABS tablet Take 1 tablet by mouth daily.   08/16/2018 at Unknown time    ROS  Blood pressure (P) 120/84, pulse (P) 77, temperature (P) 98 F (36.7 C), temperature source (P) Oral, resp. rate (P) 18, last menstrual period 07/30/2018, SpO2 (P) 97 %, currently breastfeeding. Physical Exam  Constitutional: She is oriented to person, place, and time. She appears well-developed and well-nourished. No distress.  HENT:  Head: Normocephalic and atraumatic.   Eyes: Pupils are equal, round, and reactive to light.  Neck: Normal range of motion.  Cardiovascular: Normal rate and regular rhythm.  Respiratory: Effort normal.  GI: Soft. There is abdominal tenderness. There is no rebound and no guarding.  Epigastric tenderness over hernia. Umbilical area smooth , not involved in the hernia.  Musculoskeletal: Normal range of motion.  Neurological: She is alert and oriented to person, place, and time.  Skin: Skin is warm and dry.  Psychiatric: She has a normal mood and affect. Her behavior is normal. Judgment and thought content normal.    No results found for this or any previous visit (from the past 24 hour(s)).  CBC    Component Value Date/Time   WBC 8.8 08/13/2018 1326   RBC 4.49 08/13/2018 1326   HGB 13.3 08/13/2018 1326   HGB 12.2 11/21/2017 0845   HCT 42.3 08/13/2018 1326   HCT 36.4 11/21/2017 0845   PLT 336 08/13/2018 1326   PLT 299 11/21/2017 0845   MCV 94.2 08/13/2018 1326   MCV 88 11/21/2017 0845   MCH 29.6 08/13/2018 1326   MCHC 31.4 08/13/2018 1326   RDW 14.3 08/13/2018 1326   RDW 13.1 11/21/2017 0845   LYMPHSABS 3.1 08/13/2018 1326   LYMPHSABS 3.8 (H) 07/02/2017 1044   MONOABS 0.4 08/13/2018 1326   EOSABS 0.7 (H) 08/13/2018 1326   EOSABS 0.3 07/02/2017 1044   BASOSABS 0.1 08/13/2018 1326   BASOSABS 0.0 07/02/2017 1044   CMP Latest Ref Rng & Units 08/13/2018 04/17/2018 04/14/2018  Glucose 70 - 99 mg/dL 79 - 90  BUN 6 - 20 mg/dL 13 - 11  Creatinine 0.44 - 1.00 mg/dL 0.50 - 0.45  Sodium 135 - 145 mmol/L 136 - 139  Potassium 3.5 - 5.1 mmol/L 3.8 - 3.5  Chloride 98 - 111 mmol/L 104 - 108  CO2 22 - 32 mmol/L 22 - 22  Calcium 8.9 - 10.3 mg/dL 8.7(L) - 8.8(L)  Total Protein 6.5 - 8.1 g/dL 7.7 7.1 7.0  Total Bilirubin 0.3 - 1.2 mg/dL 0.6 0.8 0.6  Alkaline Phos 38 - 126 U/L 683(H) 539(H) 574(H)  AST 15 - 41 U/L 171(H) 68(H) 131(H)  ALT 0 - 44 U/L 172(H) 130(H) 171(H)     Assessment/Plan: 1 desire for elective sterilization  for laparoscopic fallope ring placement  2. Persistent elevated LFT's : Dr Constance Haw plans liver biopsy. 3 . Epigastric hernia, for repair by Dr Constance Haw.  Jonnie Kind 08/17/2018, 7:18 AM

## 2018-08-17 NOTE — Op Note (Signed)
Please see the brief operative note that I dictated for surgical details, and Dr. Constance Haw note for her operative report

## 2018-08-17 NOTE — Op Note (Signed)
[facility] Operative Note  08/17/18  Preoperative Diagnosis:  Supraumbilical hernia, elevated LFTs    Postoperative Diagnosis:  Same   Procedure(s) Performed: Laparoscopic ventral hernia repair with mesh (Ventralight ST 15.2 cm X 20.3cm ); wedge biopsy of the liver    Surgeon: Lanell Matar. Constance Haw, MD   Assistants: Dr. Mallory Shirk, MD    Anesthesia: General endotracheal   Anesthesiologist: Lenice Llamas, MD    Specimens: Liver biopsy    Estimated Blood Loss: Minimal   Blood Replacement: None    Complications: None   Wound Class: Clean    Operative Indications: Ms. Quattrone is a 33 yo who is 6 months post partum with a ventral hernia in the supraumbilical region as well as a small umbilical defect noted on CT scan. She is going to undergo laparoscopic tubal ligation with Dr. Glo Herring and at the same time have a laparoscopic ventral hernia repair with mesh and a liver biopsy for elevated LFTs.  We have discussed the risk of surgery including but not limited to bleeding, infection, use of mesh, injury to bowel or other organs, and she has opted to proceed.   Findings: Large supraumbilical hernia 3O7FI fascial defect with omentum and falciform present    Procedure: The patient was taken to the operating room and placed supine. General endotracheal anesthesia was induced. Intravenous antibiotics were administered per protocol.  An orogastric tube positioned to decompress the stomach. The abdomen was prepared and draped in the usual sterile fashion.   Dr. Glo Herring started with his portion of the case. He performed his insertion of the urterine manipulator and changed outer gloves. See his documentation regarding insufflation and the tubal ligation and closure of the umbilical port site.  Following Dr. Johnnye Sima procedure we had a 8 mm trocar in the suprapubic region and a 11 mm trocar at the umbilicus.  Using the 10 mm camera through the 11 mm umbilical port,  I  placed two 11 mm trocars in the left lateral region to allow for the liver wedge biopsy and the mesh placement.  The 11 trocar at the umbilicus was removed and Dr. Glo Herring closed this with a 2-0 Novafil suture. This closure closed the small defect of the umbilicus that had been present.   The omentum at the fascia defect was inspected and no bowel was noted to be within the defect of close to the area. The Ligasure was used to transect the omentum from the hernia sac and hemostasis was confirmed. A portion of the falciform was also removed from the hernia defect to aid in placement of the mesh.  Once this was completed, a wedge biopsy was taken from the liver just lateral to the falciform using hook cautery.  The specimen was placed in a Endopouch and retrieved through one of the 11 mm trocars.   The trocar was replaced. The Ventralight 15.2X20.3cm mesh was marked and 4 stay sutures of 2-0 Prolene were placed.  The mesh was placed into the abdomen through the superior 23mm trocar on the left lateral side, and I ensured that the coated side was placed towards the bowel.  Using a suture passer, the 4 stay sutures at 12, 3, 6, and 9 o clock were secured to through the fascia through a superior, 2 lateral stab incisions and through the umbilical trocar site.  The mesh laid nicely against the peritoneal cavity.  The mesh was oriented with the 15.2cm width on the longitudinal axis as the patient's hernia defect was  wider than tall, and this allowed us to incorporate the umbilical closure under the mesh.  Once the mesh was secured to the peritoneal cavity, a 5 mm Protacking device was used to secure the mesh circumferentially in two rows.  In order to get the left lateral portion, and additional 5mm trocar was placed in the right lateral side.    Hemostasis of the liver was again ensured and of the omentum inferiorly. The mesh lay nicely on the peritoneal cavity.  Given the 11 mm trocar used on the left, a suture  passer was used to close both using a 0 vicryl suture.  This was done under direct visualization with a 5 mm camera through the 5 mm port.   Once these were closed, the 8 mm trocar was used by Dr. Emelda FearFerguson to place 120 cc of saline to help with evacuation of the pneumoperitoneum.  The 8 mm and 5 mm trocars were removed. The trocar sites and stab incisions for the mesh placement were closed with 4-0 Monocryl and dermabond.    Dr. Emelda FearFerguson assisted me during my portion of the case, and I assisted him through his portion of the case.   All counts were correct at the end of the case. The patient was awakened from anesthesia and extubated without complication.  The patient went to the PACU in stable condition.   Algis GreenhouseLindsay Bridges, MD [facility] [address] [place], [place] [id number] [phone] (office)

## 2018-08-17 NOTE — Transfer of Care (Signed)
Immediate Anesthesia Transfer of Care Note  Patient: [name]  Procedure(s) Performed: LAPAROSCOPIC BILATERAL TUBAL LIGATION WITH FALLOPE RINGS (Bilateral Abdomen) LAPAROSCOPIC VENTRAL HERNIA WITH MESH (N/A Abdomen) LIVER BIOPSY (N/A Abdomen)  Patient Location: PACU  Anesthesia Type:General  Level of Consciousness: awake, alert , oriented and patient cooperative  Airway & Oxygen Therapy: Patient Spontanous Breathing and Patient connected to face mask oxygen  Post-op Assessment: Report given to RN and Post -op Vital signs reviewed and stable  Post vital signs: Reviewed and stable  Last Vitals:  Vitals Value Taken Time  BP 128/90 08/17/18 1030  Temp    Pulse 75 08/17/18 1031  Resp 21 08/17/18 1031  SpO2 99 % 08/17/18 1031  Vitals shown include unvalidated device data.  Last Pain:  Vitals:   08/17/18 0642  TempSrc: (P) Oral  PainSc: (P) 0-No pain      Patients Stated Pain Goal: (P) 5 (58/09/98 3382)  Complications: No apparent anesthesia complications

## 2018-08-17 NOTE — Progress Notes (Signed)
[facility]  Spoke with Herbie Baltimore and notified him that everything went well.  Abdominal binder to be worn. Norco called in for pain medication. Will see in person next week.  Curlene Labrum, MD [facility] [address] [place], [place] [address] [phone] (office)

## 2018-08-18 ENCOUNTER — Encounter (HOSPITAL_COMMUNITY): Payer: Self-pay | Admitting: Obstetrics and Gynecology

## 2018-08-18 ENCOUNTER — Telehealth: Payer: Self-pay | Admitting: *Deleted

## 2018-08-18 ENCOUNTER — Telehealth: Payer: Self-pay | Admitting: Obstetrics and Gynecology

## 2018-08-18 NOTE — Telephone Encounter (Signed)
Pt had tubal and hernia repair yesterday. Pt taking Hydrocodone and it does help. Pt feels like it's hard to take a deep breath. Pt was told in recovery that she stopped breathing. Please advise. Thanks!! [place]

## 2018-08-18 NOTE — Telephone Encounter (Signed)
Patient called stating she had surgery yesterday and she is still hurting really bad. Please advise [phone]

## 2018-08-18 NOTE — Telephone Encounter (Signed)
Pt called our office earlier with concerns over sense of shortness of breath and some pain, symmetric, bilateral, with pain actually less when sitting up. Pt has been monitoring SaO2 with sat monitor, no SaO2 below 93 %. Pt has to be reminded to take deep breaths in RR.  Now Pt has some back pain, again symmetric , in rib/kidney area, not lower quadrants. Pt is unlabored in conversation over phone tonight. No sob doe. My impression is that this is likely residual CO2 that could not be completely evacuated p procedure, particularly with the large epigastric hernia sac. Which was covered over completely by the mesh. No fever, N, or v. Plan Pt to call me again at 8 a.m , or earlier prn,  Pt given my personal cell # for ease of f/u. Pt is medically aware, and will monitor FHR as needed, will Check.

## 2018-08-19 ENCOUNTER — Encounter: Payer: Self-pay | Admitting: General Surgery

## 2018-08-20 ENCOUNTER — Telehealth: Payer: Self-pay

## 2018-08-22 ENCOUNTER — Emergency Department (HOSPITAL_COMMUNITY): Payer: Medicaid Other

## 2018-08-22 ENCOUNTER — Emergency Department (HOSPITAL_COMMUNITY)
Admission: EM | Admit: 2018-08-22 | Discharge: 2018-08-23 | Disposition: A | Discharge status: Home / Self Care | Payer: Medicaid Other | Attending: Emergency Medicine | Admitting: Emergency Medicine

## 2018-08-22 ENCOUNTER — Other Ambulatory Visit: Payer: Self-pay

## 2018-08-22 ENCOUNTER — Encounter (HOSPITAL_COMMUNITY): Payer: Self-pay | Admitting: Emergency Medicine

## 2018-08-22 DIAGNOSIS — E039 Hypothyroidism, unspecified: Secondary | ICD-10-CM | POA: Diagnosis not present

## 2018-08-22 DIAGNOSIS — Z87891 Personal history of nicotine dependence: Secondary | ICD-10-CM | POA: Diagnosis not present

## 2018-08-22 DIAGNOSIS — R0789 Other chest pain: Secondary | ICD-10-CM | POA: Diagnosis not present

## 2018-08-22 DIAGNOSIS — Z20828 Contact with and (suspected) exposure to other viral communicable diseases: Secondary | ICD-10-CM | POA: Insufficient documentation

## 2018-08-22 DIAGNOSIS — Z79899 Other long term (current) drug therapy: Secondary | ICD-10-CM | POA: Insufficient documentation

## 2018-08-22 DIAGNOSIS — R0602 Shortness of breath: Secondary | ICD-10-CM | POA: Diagnosis not present

## 2018-08-22 LAB — BASIC METABOLIC PANEL
Anion gap: 11 (ref 5–15)
BUN: 11 mg/dL (ref 6–20)
CO2: 22 mmol/L (ref 22–32)
Calcium: 8.8 mg/dL — ABNORMAL LOW (ref 8.9–10.3)
Chloride: 106 mmol/L (ref 98–111)
Creatinine, Ser: 0.7 mg/dL (ref 0.44–1.00)
GFR calc Af Amer: >60 mL/min (ref >60)
GFR calc non Af Amer: >60 mL/min (ref >60)
Glucose, Bld: 100 mg/dL — ABNORMAL HIGH (ref 70–99)
Potassium: 3.1 mmol/L — ABNORMAL LOW (ref 3.5–5.1)
Sodium: 139 mmol/L (ref 135–145)

## 2018-08-22 LAB — TROPONIN I (HIGH SENSITIVITY): Troponin I (High Sensitivity): 4 ng/L (ref <18)

## 2018-08-22 LAB — CBC
HCT: 39.1 % (ref 36.0–46.0)
Hemoglobin: 12.7 g/dL (ref 12.0–15.0)
MCH: 29.5 pg (ref 26.0–34.0)
MCHC: 32.5 g/dL (ref 30.0–36.0)
MCV: 90.7 fL (ref 80.0–100.0)
Platelets: 343 10*3/uL (ref 150–400)
RBC: 4.31 MIL/uL (ref 3.87–5.11)
RDW: 13.4 % (ref 11.5–15.5)
WBC: 9.5 10*3/uL (ref 4.0–10.5)
nRBC: 0 % (ref 0.0–0.2)

## 2018-08-22 MED ORDER — SODIUM CHLORIDE 0.9% FLUSH: 3.0000 mL | Freq: Once | INTRAVENOUS | Status: DC

## 2018-08-22 MED ORDER — IOHEXOL 350 MG/ML SOLN
100.0000 mL | Freq: Once | INTRAVENOUS | Status: AC | PRN
  Administered 2018-08-22: 100 mL via INTRAVENOUS

## 2018-08-22 NOTE — ED Triage Notes (Signed)
Patient states that she is having shortness of breath that started yesterday. Patient had surgery Monday for a hernia repair and had a tubal ligation. Patient appears to have panic attack symptoms at this time.

## 2018-08-22 NOTE — ED Notes (Addendum)
Lab in room.

## 2018-08-22 NOTE — ED Notes (Signed)
edp to room  

## 2018-08-22 NOTE — ED Provider Notes (Signed)
[facility] EMERGENCY DEPARTMENT Provider Note   CSN: [id number] Arrival date & time: 08/22/18  2201  Time seen 11:10 PM  History   Chief Complaint Chief Complaint  Patient presents with  . Shortness of Breath    HPI [name] is a 33 y.o. female.     HPI patient had laparoscopic repair of a ventral hernia and small periumbilical hernia done on July 6 by Dr. Henreitta LeberBridges and also had a tubal ligation done by Dr. Emelda FearFerguson at the same time.  She also had a wedge biopsy of her liver done for persistent elevation of LFTs.  She states she started feeling short of breath 1 to 2 days prior to today.  However she felt more short of breath and got acutely worse about 1 to 2 hours prior to coming to the ED.  She states she is having pain in her left lateral chest that radiates to the center of her chest that comes and goes.  She states laying down makes the shortness of breath worse, sitting up makes the breathing feel better.  She states she has been doing bedrest but she is also been up.  She denies any swelling of her legs, fever, cough, or wheezing.  She denies being on any type of hormones.  She does not smoke now but did quit a couple years ago and states she was a very light smoker at that time.  She denies any prior history of DVT or PE or any type of breathing difficulties.  She states her grandparents had blood clots in the her legs.  Her mother died of heart disease.  She states she is never had any type of breathing problems in the past.  PCP Richardean Chimeraaniel, Terry G, MD   Past Medical History:  Diagnosis Date  . Breast nodule 10/10/2014  . Breast pain 10/10/2014  . BV (bacterial vaginosis) 11/12/2012  . Depression   . Fatty liver   . Hypothyroid 02/14/2015  . Irregular menses 05/21/2013  . Migraines   . Obesity   . Other and unspecified ovarian cyst 05/31/2013   Right complex ?cystic vs solid mass on ovary will schedule appt with [facility]  . Pregnant 02/14/2015  . Thyroid disease    Reported  Hashimoto's Thyroiditis in past    Patient Active Problem List   Diagnosis Date Noted  . Encounter for tubal ligation 08/17/2018  . Elevated liver function tests   . Transaminitis 04/23/2018  . Supraumbilical hernia without gangrene and without obstruction 04/09/2018  . Normal labor 01/31/2018  . Breech presentation 01/18/2018  . History of vaginal delivery following previous cesarean delivery 07/02/2017  . UTI (urinary tract infection) during pregnancy, first trimester 06/23/2017  . Anxiety 05/30/2015  . 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 Previous cesarean section complicating pregnancy 03/07/2015  . S/P right oophorectomy 03/07/2015  . Hypothyroidism 02/14/2015  . Breast pain 10/10/2014    Past Surgical History:  Procedure Laterality Date  . BIOPSY  04/17/2018   Procedure: BIOPSY;  Surgeon: Malissa Hippoehman, Najeeb U, MD;  Location: [facility] SU[facility];  Service: Endoscopy;;  colon  . CESAREAN SECTION    . COLONOSCOPY    . COLONOSCOPY WITH PROPOFOL N/A 04/17/2018   Procedure: COLONOSCOPY WITH PROPOFOL;  Surgeon: Malissa Hippoehman, Najeeb U, MD;  Location: [facility] END[facility] SUITE;  Service: Endoscopy;  Laterality: N/A;  10:50  . LIVER BIOPSY N/A 08/17/2018   Procedure: LIVER BIOPSY;  Surgeon: Lucretia RoersBridges, Lindsay C, MD;  Location: [facility] ORS;  Service: General;  Laterality:  N/A;  . OOPHORECTOMY Right   . TONSILLECTOMY    . TUBAL LIGATION Bilateral 08/17/2018   Procedure: LAPAROSCOPIC BILATERAL TUBAL LIGATION WITH FALLOPE RINGS;  Surgeon: Tilda BurrowFerguson, John V, MD;  Location: [facility] ORS;  Service: Gynecology;  Laterality: Bilateral;  . VENTRAL HERNIA REPAIR N/A 08/17/2018   Procedure: LAPAROSCOPIC VENTRAL HERNIA WITH MESH;  Surgeon: Lucretia RoersBridges, Lindsay C, MD;  Location: [facility] ORS;  Service: General;  Laterality: N/A;  . WISDOM TOOTH EXTRACTION       OB History    Gravida  3   Para  3   Term  3   Preterm      AB      Living  4     SAB      TAB      Ectopic      Multiple  1   Live Births  4            Home Medications     Prior to Admission medications   Medication Sig Start Date End Date Taking? Authorizing Provider  docusate sodium (COLACE) 100 MG capsule Take 1 capsule (100 mg total) by mouth 2 (two) times daily. 08/17/18 08/17/19  Lucretia RoersBridges, Lindsay C, MD  HYDROcodone-acetaminophen (NORCO/VICODIN) 5-325 MG tablet Take 1-2 tablets by mouth every 4 (four) hours as needed for severe pain. 08/17/18 08/17/19  Lucretia RoersBridges, Lindsay C, MD  levothyroxine (SYNTHROID, LEVOTHROID) 150 MCG tablet Take 1 tablet (150 mcg total) by mouth daily before breakfast. 11/28/17   Nida, Denman GeorgeGebreselassie W, MD  Prenatal Vit-Fe Fumarate-FA (PRENATAL MULTIVITAMIN) TABS tablet Take 1 tablet by mouth daily.    [provider]    Family History Family History  Problem Relation Age of Onset  . Diabetes Maternal Grandmother   . Hypertension Maternal Grandmother   . Cancer Maternal Grandmother        breast  . Diabetes Maternal Grandfather   . Hypertension Maternal Grandfather   . Leukemia Maternal Grandfather   . Diabetes Mother   . Hypertension Mother   . Heart attack Mother   . Stroke Mother   . Kidney disease Paternal Grandfather   . Seizures Father   . Other Father        back problems  . Dementia Paternal Grandmother   . Cancer Maternal Aunt   . Diabetes Maternal Uncle   . Colon cancer Cousin   . Stomach cancer Other   . Ovarian cancer Cousin   . Ulcerative colitis Neg Hx   . Crohn's disease Neg Hx     Social History Social History   Tobacco Use  . Smoking status: Former Smoker    Packs/day: 0.25    Years: 8.00    Pack years: 2.00    Types: Cigarettes    Quit date: 10/07/2010    Years since quitting: 7.8  . Smokeless tobacco: Never Used  Substance Use Topics  . Alcohol use: No    Comment: rare  . Drug use: No     Allergies   Imitrex [sumatriptan], Maxalt [rizatriptan], Phenergan [promethazine hcl], and Tape   Review of Systems Review of Systems  All other systems reviewed and are negative.    Physical  Exam Updated Vital Signs BP (!) 114/94   Pulse 86   Temp 98.6 F (37 C) (Oral)   Resp (!) 27   Ht 5\' 2"  (1.575 m)   Wt 117.9 kg   LMP 07/30/2018   SpO2 97%   BMI 47.55 kg/m   Vital signs normal  except for tachypnea  Physical Exam Vitals signs and nursing note reviewed.  Constitutional:      General: She is in acute distress.     Appearance: She is well-developed. She is obese.  HENT:     Head: Normocephalic and atraumatic.     Right Ear: External ear normal.     Left Ear: External ear normal.     Nose: Nose normal.     Mouth/Throat:     Mouth: Mucous membranes are moist.  Eyes:     Extraocular Movements: Extraocular movements intact.     Conjunctiva/sclera: Conjunctivae normal.     Pupils: Pupils are equal, round, and reactive to light.  Neck:     Musculoskeletal: Normal range of motion and neck supple.  Cardiovascular:     Rate and Rhythm: Normal rate and regular rhythm.     Heart sounds: Normal heart sounds.  Pulmonary:     Effort: Respiratory distress present.     Breath sounds: Normal breath sounds. Decreased air movement present.  Abdominal:     Palpations: Abdomen is soft.     Comments: Patient has a lot of redness around her umbilicus, she denies any pain there or itching.  She has a abdominal binder on, but most of the laparoscopic sites that I could visualize did not appear infected.  Musculoskeletal: Normal range of motion.        General: No swelling or tenderness.  Skin:    General: Skin is warm and dry.     Capillary Refill: Capillary refill takes less than 2 seconds.  Neurological:     General: No focal deficit present.     Mental Status: She is alert and oriented to person, place, and time.     Cranial Nerves: No cranial nerve deficit.  Psychiatric:        Attention and Perception: Attention normal.        Mood and Affect: Mood is anxious.        Speech: Speech is rapid and pressured.        Behavior: Behavior normal.        Thought Content:  Thought content normal.      ED Treatments / Results  Labs (all labs ordered are listed, but only abnormal results are displayed) Results for orders placed or performed during the hospital encounter of 08/22/18  SARS Coronavirus 2 (CEPHEID- Performed in [place] hospital lab), [facility] Order   Specimen: Nasopharyngeal Swab  Result Value Ref Range   SARS Coronavirus 2 NEGATIVE NEGATIVE  Basic metabolic panel  Result Value Ref Range   Sodium 139 135 - 145 mmol/L   Potassium 3.1 (L) 3.5 - 5.1 mmol/L   Chloride 106 98 - 111 mmol/L   CO2 22 22 - 32 mmol/L   Glucose, Bld 100 (H) 70 - 99 mg/dL   BUN 11 6 - 20 mg/dL   Creatinine, Ser 0.70 0.44 - 1.00 mg/dL   Calcium 8.8 (L) 8.9 - 10.3 mg/dL   GFR calc non Af Amer >60 >60 mL/min   GFR calc Af Amer >60 >60 mL/min   Anion gap 11 5 - 15  CBC  Result Value Ref Range   WBC 9.5 4.0 - 10.5 K/uL   RBC 4.31 3.87 - 5.11 MIL/uL   Hemoglobin 12.7 12.0 - 15.0 g/dL   HCT 39.1 36.0 - 46.0 %   MCV 90.7 80.0 - 100.0 fL   MCH 29.5 26.0 - 34.0 pg   MCHC 32.5 30.0 -  36.0 g/dL   RDW 16.113.4 09.611.5 - 04.515.5 %   Platelets 343 150 - 400 K/uL   nRBC 0.0 0.0 - 0.2 %  Troponin I (High Sensitivity)  Result Value Ref Range   Troponin I (High Sensitivity) 4.00 <18 ng/L  Troponin I (High Sensitivity)  Result Value Ref Range   Troponin I (High Sensitivity) 4.00 <18 ng/L   Laboratory interpretation all normal except  hypokalemia, normal delta troponin    EKG EKG Interpretation  Date/Time:  Saturday August 22 2018 22:32:20 EDT Ventricular Rate:  77 PR Interval:    QRS Duration: 87 QT Interval:  558 QTC Calculation: 636 R Axis:   49 Text Interpretation:  Sinus rhythm Low voltage, precordial leads Borderline repolarization abnormality Prolonged QT interval No old tracing to compare Confirmed by Devoria AlbeKnapp, Lidie Glade ([id number]) on 08/22/2018 11:19:16 PM   Radiology Ct Angio Chest Pe W/cm &/or Wo Cm  Result Date: 08/23/2018 CLINICAL DATA:  Shortness of breath recent hernia  repair EXAM: CT ANGIOGRAPHY CHEST WITH CONTRAST TECHNIQUE: Multidetector CT imaging of the chest was performed using the standard protocol during bolus administration of intravenous contrast. Multiplanar CT image reconstructions and MIPs were obtained to evaluate the vascular anatomy. CONTRAST:  100mL OMNIPAQUE IOHEXOL 350 MG/ML SOLN COMPARISON:  None. FINDINGS: Cardiovascular: Satisfactory opacification of the pulmonary arteries to the segmental level. Nondiagnostic for evaluation subsegmental PE, particularly within the lower lobes, secondary to respiratory motion. Aorta is nonaneurysmal. Heart size upper normal. No pericardial effusion Mediastinum/Nodes: No enlarged mediastinal, hilar, or axillary lymph nodes. Thyroid gland, trachea, and esophagus demonstrate no significant findings. Lungs/Pleura: Lungs are clear. No pleural effusion or pneumothorax. Upper Abdomen: No acute abnormality. Musculoskeletal: No chest wall abnormality. No acute or significant osseous findings. Review of the MIP images confirms the above findings. IMPRESSION: 1. Limited evaluation for subsegmental emboli in the lower lobes secondary to respiratory motion. No acute central embolus is seen. 2. Clear lung fields Electronically Signed   By: Jasmine PangKim  Fujinaga M.D.   On: 08/23/2018 00:34    Procedures Procedures (including critical care time)  Medications Ordered in ED Medications  sodium chloride flush (NS) 0.9 % injection 3 mL (has no administration in time range)  iohexol (OMNIPAQUE) 350 MG/ML injection 100 mL (100 mLs Intravenous Contrast Given 08/22/18 2343)  albuterol (VENTOLIN HFA) 108 (90 Base) MCG/ACT inhaler 8 puff (8 puffs Inhalation Given 08/23/18 0333)     Initial Impression / Assessment and Plan / ED Course  I have reviewed the triage vital signs and the nursing notes.  Pertinent labs & imaging results that were available during my care of the patient were reviewed by me and considered in my medical decision making  (see chart for details).       Patient presents with tachypnea and a feeling of shortness of breath that she is never had before.  She did have recent surgery and is at increased risk of a PE.  CTA was ordered.  Recheck at 2:55 AM we discussed her test results which were very reassuring.  Patient states she still feels very short of breath.  She states her pain from her surgery is improving.  She now has this new trouble breathing.  She used to smoke so I am going to try an albuterol inhaler to see if that helps with her feeling of shortness of breath.  Patient still felt short of breath but have some mild improvement after the albuterol inhaler.  She was discharged with the inhaler and a spacer.  Final Clinical Impressions(s) / ED Diagnoses   Final diagnoses:  Shortness of breath    ED Discharge Orders    None    albuterol inhaler  Plan discharge  Devoria Albe, MD, Concha Pyo, MD 08/23/18 [phone]

## 2018-08-23 LAB — SARS CORONAVIRUS 2 BY RT PCR (HOSPITAL ORDER, PERFORMED IN ~~LOC~~ HOSPITAL LAB): SARS Coronavirus 2: NEGATIVE

## 2018-08-23 LAB — TROPONIN I (HIGH SENSITIVITY): Troponin I (High Sensitivity): 4 ng/L (ref <18)

## 2018-08-23 MED ORDER — ALBUTEROL SULFATE HFA 108 (90 BASE) MCG/ACT IN AERS
8.0000 | INHALATION_SPRAY | Freq: Once | RESPIRATORY_TRACT | Status: AC
  Administered 2018-08-23: 8 via RESPIRATORY_TRACT
  Filled 2018-08-23: qty 6.7

## 2018-08-23 NOTE — Discharge Instructions (Addendum)
Use the inhaler as needed every 6 hours for shortness of breath.  Use 2 to 6 puffs at a time.  Try to take big deep breaths so you do not develop pneumonia.  Keep your follow-up appointments after your surgery.  Recheck if you get a fever, cough, or coughed up mucus.

## 2018-08-24 ENCOUNTER — Telehealth: Payer: Self-pay | Admitting: General Surgery

## 2018-08-24 DIAGNOSIS — K439 Ventral hernia without obstruction or gangrene: Secondary | ICD-10-CM

## 2018-08-24 NOTE — Telephone Encounter (Signed)
[facility]  Patient was having trouble breathing and could not cough. Went to the ED and says she is feeling better. Left rib hurting. Overall doing better. But still with pain and pain when she does not wear the binder.  No other complaints.   Seeing Dr. Glo Herring on 08/26/18.  I will plan to see her on 7/21 @ 10:15 so that she does not have two back to back appt.   Curlene Labrum, MD [facility] [address] [place], [place] [address] [phone] (office)

## 2018-08-26 ENCOUNTER — Encounter: Payer: Self-pay | Admitting: Obstetrics and Gynecology

## 2018-08-26 ENCOUNTER — Ambulatory Visit (INDEPENDENT_AMBULATORY_CARE_PROVIDER_SITE_OTHER): Payer: Self-pay | Admitting: Obstetrics and Gynecology

## 2018-08-26 ENCOUNTER — Other Ambulatory Visit: Payer: Self-pay

## 2018-08-26 VITALS — BP 120/81 | HR 84 | Ht 62.0 in | Wt 265.8 lb

## 2018-08-26 DIAGNOSIS — Z09 Encounter for follow-up examination after completed treatment for conditions other than malignant neoplasm: Secondary | ICD-10-CM

## 2018-08-26 NOTE — Progress Notes (Signed)
Patient ID: [name], female   DOB: Sep 27, 1985, 33 y.o.   MRN: [id number]  Subjective:  [name] is a 33 y.o. female now 1 week and 2 days status post Laparoscopic bilateral tubal ligation with fallope rings and Laparoscopic ventral hernia with mesh. Went to   [facility] to ER 08/22/2018 for SOB. Was given inhaler and didn't want to take it, believes she hyperventilated and passed out. CT of angio  chest done.  IMPRESSION: 1. Limited evaluation for subsegmental emboli in the lower lobes secondary to respiratory motion. No acute central embolus is seen. 2. Clear lung fields   Electronically Signed   By: Donavan Foil M.D.   On: 08/23/2018 00:34     Abdominal binder is helping, has to take it off for showering, excrutiating pain in her left rib near the left upper quadrant trochar site that she has to get out of the shower and sit down or a while. Review of Systems Negative except pain on left side near stitch   Diet:   normal   Bowel movements : normal.  Pain is controlled with current analgesics. Medications being used: narcotic analgesics including hydrocodone/acetaminophen (Lorcet, Lortab, Norco, Vicodin). Taking only once a day.  Objective:  BP 120/81 (BP Location: Left Arm, Patient Position: Sitting, Cuff Size: Normal)    Pulse 84    Ht 5\' 2"  (1.575 m)    Wt 265 lb 12.8 oz (120.6 kg)    LMP 07/30/2018 (Exact Date)    Breastfeeding Yes    BMI 48.62 kg/m  General:Well developed, well nourished.  No acute distress. Abdomen: Bowel sounds normal, soft, non-tender. Pelvic Exam: DEFERRED  Incision(s): Healing slowly, Skin fungus around navel no drainage, no hernia, no swelling, no dehiscence   Assessment:  Post-Op 1 week and 2 days s/p Laparoscopic bilateral tubal ligation with fallope rings and Laparoscopic ventral hernia with mesh   Healing slowly postoperatively.  Skin fungus around navel, tx Lamisil  Skin rash on left side abdomen to due hospital monitor   Plan:   1.Wound care discussed  2. . current medications.Lamisil 3. Activity restrictions: no driving 4. return to work: not applicable. 5. Follow up in PRN  By signing my name below, I, Samul Dada, attest that this documentation has been prepared under the direction and in the presence of Jonnie Kind, MD. Electronically Signed: [place]. 08/26/18. 10:11 AM.  I personally performed the services described in this documentation, which was SCRIBED in my presence. The recorded information has been reviewed and considered accurate. It has been edited as necessary during review. Jonnie Kind, MD

## 2018-08-27 ENCOUNTER — Telehealth: Payer: Self-pay | Admitting: General Surgery

## 2018-08-27 NOTE — Telephone Encounter (Signed)
[facility]  Overall feeling better. Says she is needing the binder and this helps. She is not hurting until getting up.  I have sent Dr. 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  I have told Lima this diagnosis and have told her Dr. Laural Golden will need to determine the next best steps/ plan.  I see her next week in person. She does not need any pain medication refill at this time.  Curlene Labrum, MD [facility] [address] [place], [place] [address] [phone] (office)

## 2018-09-01 ENCOUNTER — Other Ambulatory Visit: Payer: Self-pay

## 2018-09-01 ENCOUNTER — Ambulatory Visit (INDEPENDENT_AMBULATORY_CARE_PROVIDER_SITE_OTHER): Payer: Self-pay | Admitting: Internal Medicine

## 2018-09-01 ENCOUNTER — Ambulatory Visit (INDEPENDENT_AMBULATORY_CARE_PROVIDER_SITE_OTHER): Payer: Self-pay | Admitting: General Surgery

## 2018-09-01 ENCOUNTER — Encounter (INDEPENDENT_AMBULATORY_CARE_PROVIDER_SITE_OTHER): Payer: Self-pay | Admitting: Internal Medicine

## 2018-09-01 ENCOUNTER — Encounter: Payer: Self-pay | Admitting: General Surgery

## 2018-09-01 VITALS — BP 113/79 | HR 76 | Temp 98.2°F | Resp 16 | Ht 63.0 in | Wt 268.0 lb

## 2018-09-01 DIAGNOSIS — K439 Ventral hernia without obstruction or gangrene: Secondary | ICD-10-CM

## 2018-09-01 DIAGNOSIS — R7401 Elevation of levels of liver transaminase levels: Secondary | ICD-10-CM

## 2018-09-01 DIAGNOSIS — R740 Nonspecific elevation of levels of transaminase and lactic acid dehydrogenase [LDH]: Secondary | ICD-10-CM

## 2018-09-01 DIAGNOSIS — K743 Primary biliary cirrhosis: Secondary | ICD-10-CM

## 2018-09-01 MED ORDER — URSODIOL 300 MG PO CAPS: 300.0000 mg | ORAL_CAPSULE | Freq: Three times a day (TID) | ORAL | 5 refills | Status: DC

## 2018-09-01 NOTE — Patient Instructions (Signed)
Physician will call with results of blood test when completed. 

## 2018-09-01 NOTE — Progress Notes (Signed)
Presenting complaint;  To discuss results of liver biopsy and management of liver condition.  Database and subjective:  Patient is 33 year old Caucasian female whom I saw in March 2020 for diagnostic colonoscopy as recommended by Dr. Blake Divine for left-sided abdominal pain.  CT revealed ventral/supraumbilical hernia and she had filling defect in the cecum suspicion for a mass.  She therefore underwent colonoscopy on 04/17/2018 revealing normal terminal ileum and cecum as well as rest of the colon except nonspecific finding of edema to mucosa of a sending colon but biopsy was unremarkable.  Evaluation prior to colonoscopy revealed that patient had history of elevated transaminases for about 4 years.  She previously had been thought to have fatty liver. LFTs repeated on 04/17/2018.  Bilirubin was 0.8 AP 539 AST 68 and ALT 130. LFTs from April 2017 revealed normal transaminases and alkaline phosphatase of 186. Antimitochondrial antibody was obtained and was negative. I requested the Dr. Constance Haw do a liver biopsy at the time of hernia repair if feasible. She had laparoscopic ventral hernia repair with mesh and she also had tubal ligation by Dr. Mallory Shirk on 08/17/2018. Patient was seen in emergency room on 08/22/2018 for shortness of breath and chest CTA was negative for pulmonary embolism.  She was given bronchodilator and her symptoms have resolved.  Patient states she first found out her transaminases were elevated in 2011 when she was pregnant.  Ultrasound was negative.  She was seen by Dr. Britta Mccreedy of [facility].  Post pregnancy her LFTs returned to normal.  Once again she developed elevated transaminases following her second pregnancy and and again LFTs returned to normal post pregnancy.  Her LFTs went up again with her third pregnancy which is over 2 years ago and her LFTs have not returned to normal. She had some itching during second pregnancy but not anymore.  She denies chronic  abdominal pain.  She states she had hepatitis a and B vaccination when she was in middle school.  She has good appetite.  She states she has gained close to 25 pounds in the last 2 years or so.  She denies diarrhea constipation melena or rectal bleeding. HCV antibody test has been negative. Patient reports mono spot test was about 2 years ago. Family history is negative for chronic liver disease. Her second cousin had CRC in his 59s. Her mother had melanoma as her 13s but she died at age 74 of MI.  Father is 25 years old and may have hemangioma. Family history is also positive for breast and stomach cancer. Patient does not have any siblings. Patient does not smoke cigarettes and she may have 1 or 2 drinks in a year.   Current Medications: Outpatient Encounter Medications as of 09/01/2018  Medication Sig  . docusate sodium (COLACE) 100 MG capsule Take 1 capsule (100 mg total) by mouth 2 (two) times daily.  Marland Kitchen HYDROcodone-acetaminophen (NORCO/VICODIN) 5-325 MG tablet Take 1-2 tablets by mouth every 4 (four) hours as needed for severe pain.  Marland Kitchen levothyroxine (SYNTHROID, LEVOTHROID) 150 MCG tablet Take 1 tablet (150 mcg total) by mouth daily before breakfast.  . Prenatal Vit-Fe Fumarate-FA (PRENATAL MULTIVITAMIN) TABS tablet Take 1 tablet by mouth daily.   No facility-administered encounter medications on file as of 09/01/2018.    Past Medical History:  Diagnosis Date  .  10/10/2014  .  10/10/2014  .  11/12/2012  . Depression   . Fatty liver   . Hypothyroid 02/14/2015  . Irregular menses 05/21/2013  .  Migraines   . Obesity   . Other and unspecified ovarian cyst 05/31/2013   Right complex ?cystic vs solid mass on ovary will schedule appt with [facility]  . Pregnant 02/14/2015  . Thyroid disease    Reported Hashimoto's Thyroiditis in past   Past Surgical History:  Procedure Laterality Date  . BIOPSY  04/17/2018   Procedure: BIOPSY;  Surgeon: Rogene Houston, MD;  Location: [facility];  Service:  Endoscopy;;  colon  . CESAREAN SECTION    . COLONOSCOPY    . COLONOSCOPY WITH PROPOFOL N/A 04/17/2018   Procedure: COLONOSCOPY WITH PROPOFOL;  Surgeon: Rogene Houston, MD;  Location: [facility];  Service: Endoscopy;  Laterality: N/A;  10:50  . LIVER BIOPSY N/A 08/17/2018   Procedure: LIVER BIOPSY;  Surgeon: Virl Cagey, MD;  Location: [facility] ORS;  Service: General;  Laterality: N/A;  . OOPHORECTOMY Right   . TONSILLECTOMY    . TUBAL LIGATION Bilateral 08/17/2018   Procedure: LAPAROSCOPIC BILATERAL TUBAL LIGATION WITH FALLOPE RINGS;  Surgeon: Jonnie Kind, MD;  Location: [facility] OR[facility];  Service: Gynecology;  Laterality: Bilateral;  . VENTRAL HERNIA REPAIR N/A 08/17/2018   Procedure: LAPAROSCOPIC VENTRAL HERNIA WITH MESH;  Surgeon: Virl Cagey, MD;  Location: [facility];  Service: General;  Laterality: N/A;  . WISDOM TOOTH EXTRACTION        Objective: Blood pressure 108/76, pulse 87, temperature 97.6 F (36.4 C), temperature source Oral, resp. rate 18, height '5\' 3"'$  (1.6 m), weight 267 lb 8 oz (121.3 kg), last menstrual period 07/30/2018, currently breastfeeding. Patient is alert and in no acute distress. Conjunctiva is pink. Sclera is nonicteric Oropharyngeal mucosa is normal. No neck masses or thyromegaly noted. Cardiac exam with regular rhythm normal S1 and S2. No murmur or gallop noted. Lungs are clear to auscultation. Abdomen is obese.  On palpation is soft and nontender.  She has laparoscopy scars.  Spleen is not palpable.  Liver edge is indistinct below RCM.  Does not appear to be enlarged. No LE edema or clubbing noted. She has a tattoo over left pectoral region.  Labs/studies Results:  CBC Latest Ref Rng & Units 08/22/2018 08/13/2018 04/14/2018  WBC 4.0 - 10.5 K/uL 9.5 8.8 6.6  Hemoglobin 12.0 - 15.0 g/dL 12.7 13.3 12.5  Hematocrit 36.0 - 46.0 % 39.1 42.3 41.5  Platelets 150 - 400 K/uL 343 336 284    CMP Latest Ref Rng & Units 08/22/2018 08/13/2018 04/17/2018  Glucose 70 - 99  mg/dL 100(H) 79 -  BUN 6 - 20 mg/dL 11 13 -  Creatinine 0.44 - 1.00 mg/dL 0.70 0.50 -  Sodium 135 - 145 mmol/L 139 136 -  Potassium 3.5 - 5.1 mmol/L 3.1(L) 3.8 -  Chloride 98 - 111 mmol/L 106 104 -  CO2 22 - 32 mmol/L 22 22 -  Calcium 8.9 - 10.3 mg/dL 8.8(L) 8.7(L) -  Total Protein 6.5 - 8.1 g/dL - 7.7 7.1  Total Bilirubin 0.3 - 1.2 mg/dL - 0.6 0.8  Alkaline Phos 38 - 126 U/L - 683(H) 539(H)  AST 15 - 41 U/L - 171(H) 68(H)  ALT 0 - 44 U/L - 172(H) 130(H)    Hepatic Function Latest Ref Rng & Units 08/13/2018 04/17/2018 04/14/2018  Total Protein 6.5 - 8.1 g/dL 7.7 7.1 7.0  Albumin 3.5 - 5.0 g/dL 4.0 3.8 3.7  AST 15 - 41 U/L 171(H) 68(H) 131(H)  ALT 0 - 44 U/L 172(H) 130(H) 171(H)  Alk Phosphatase 38 - 126 U/L  683(H) 539(H) 574(H)  Total Bilirubin 0.3 - 1.2 mg/dL 0.6 0.8 0.6  Bilirubin, Direct 0.0 - 0.2 mg/dL - 0.2 -    Liver biopsy results as followPATCHY, PORTAL-BASED FIBROINFLAMMATORY CHANGES AND NON-NECROTIZING GRANULOMAS, MOST CONSISTENT WITH AUTOIMMUNE CHOLANGIOPATHY (AMA NEGATIVE PRIMARY BILIARY CHOLANGITIS) OR PBC-LIKE LESION; SEE NOTE - MILD PORTAL FIBROSIS (STAGE 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  No history of inflammatory bowel disease.  Therefore [place] highly unlikely and her clinical course has been unremarkable which would also not favor [place].  Similarly I doubt that she has sarcoidosis but will screen her for this condition.  Also need to screen for other forms of autoimmune cholangiopathy's. It is worth noting that she already has an other or autoimmune disease in the form of Hashimoto's which can occur with PBC.  Other risk factor for liver diseases fatty liver.  She deftly needs to change her lifestyle and must lose  weight.   Plan:  Patient will go to the lab for the following tests Sed rate, SMA,  ANA, ACE level, serum immunoglobulins, serum IgG4, hepatitis B surface antibody as well as HCV antibody. Will start patient on Urso 300 mg by mouth 3 times a day.  No contraindication to use of medication with breast-feeding. Patient will have LFTs prior to her office visit in 3 months. Patient encouraged to call office should she experience any side effects with this therapy.

## 2018-09-01 NOTE — Progress Notes (Signed)
[facility] Note   HPI:  33 y.o. Female presents to clinic for post-op follow-up evaluation after a laparoscopic ventral hernia repair and liver biopsy in coordination with Dr. Johnnye Sima tubal ligation. Doing fair overall but having pain at the left sided trocar site. Likely from the suture passer closure of that site. No fever or chills. Had some redness at umbilicus and Dr. Glo Herring told her to do yeast medication to the area.   Review of Systems:  Minor redness at umbilicus, improved with yeast medication  All other review of systems: otherwise negative   Vital Signs:  BP 113/79 (BP Location: Left Arm, Patient Position: Sitting, Cuff Size: Normal)   Pulse 76   Temp 98.2 F (36.8 C) (Tympanic)   Resp 16   Ht 5\' 3"  (1.6 m)   Wt 268 lb (121.6 kg)   SpO2 97%   BMI 47.47 kg/m    Physical Exam:  Physical Exam Vitals signs reviewed.  HENT:     Head: Normocephalic.  Cardiovascular:     Rate and Rhythm: Normal rate.  Pulmonary:     Effort: Pulmonary effort is normal.  Abdominal:     General: There is no distension.     Palpations: Abdomen is soft.     Tenderness: There is abdominal tenderness.     Comments: Port sites healing, no signs of infection, umbilical redness looks like more from pant line, no obvious signs of yeast now, tender over the upper left port site   Neurological:     Mental Status: She is alert.       Assessment:  33 y.o. yo Female s/p laparosocpic ventral hernia repair, tubal ligation and liver biopsy. She is doing fair overall. She is having some pain and I think this is related to the muscle closure from the suture passer. This will improve as the Vicryl dissolves. She is wearing her binder and has no pain as long as she wears the binder. She needs 1 narcotic pill a day with her shower due to not being able to wear the binder.  Liver biopsy results previously discussed and appt with Dr. Laural Golden today.   Plan:  Continue pain medication as  needed. Call if you need a refill. Continue to wear the binder. Follow up with Dr. Laural Golden.  No lifting > 10lbs, excessive bending, pushing, pulling, squatting, in the next 6-8 weeks after surgery.  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 She will have to wait until after 9/1 to return to work.   Future Appointments  Date Time Provider [place]  09/01/2018  1:30 PM Rogene Houston, MD [facility] None  10/13/2018 10:30 AM Virl Cagey, MD [facility] None      All of the above recommendations were discussed with the patient and all of patient's questions were answered to her expressed satisfaction.  Curlene Labrum, MD [facility] [address] [place], [place] [address] [phone] (office)

## 2018-09-01 NOTE — Patient Instructions (Addendum)
Continue pain medication as needed. Call if you need a refill. Continue to wear the binder. Follow up with Dr. Laural Golden.  No lifting > 10lbs, excessive bending, pushing, pulling, squatting, in the next 6-8 weeks after surgery.

## 2018-09-07 ENCOUNTER — Other Ambulatory Visit (INDEPENDENT_AMBULATORY_CARE_PROVIDER_SITE_OTHER): Payer: Self-pay | Admitting: *Deleted

## 2018-09-07 DIAGNOSIS — K743 Primary biliary cirrhosis: Secondary | ICD-10-CM

## 2018-09-07 LAB — ANGIOTENSIN CONVERTING ENZYME: Angiotensin-Converting Enzyme: 21 U/L (ref 9–67)

## 2018-09-07 LAB — ANTI-SMOOTH MUSCLE ANTIBODY, IGG: Actin (Smooth Muscle) Antibody (IGG): 48 U — ABNORMAL HIGH (ref <20)

## 2018-09-07 LAB — IGE: IgE (Immunoglobulin E), Serum: 2 kU/L (ref <=114)

## 2018-09-07 LAB — IGG, IGA, IGM
IgG (Immunoglobin G), Serum: 1138 mg/dL (ref 600–1640)
IgM, Serum: 200 mg/dL (ref 50–300)
Immunoglobulin A: 94 mg/dL (ref 47–310)

## 2018-09-07 LAB — ALLERGEN, CORN, IGG4: Allergen, Corn, IgG4: <0.15 ug/mL

## 2018-09-07 LAB — SEDIMENTATION RATE: Sed Rate: 28 mm/h — ABNORMAL HIGH (ref 0–20)

## 2018-09-07 LAB — HEPATITIS C ANTIBODY
Hepatitis C Ab: NONREACTIVE
SIGNAL TO CUT-OFF: 0.02 (ref <1.00)

## 2018-09-07 LAB — HEPATITIS B SURFACE ANTIBODY,QUALITATIVE: Hep B S Ab: REACTIVE — AB

## 2018-09-07 LAB — ANACHOICE SPEC AB CASCADING REFLEX: ANACHOICE (R) SCREEN: NEGATIVE

## 2018-09-22 NOTE — Telephone Encounter (Signed)
Pt called stating that she was having pain and discomfort under her ribcage. Pt was advised pt to get gas x or phazyme as her symptoms sound like gas and to take meds based on package directions, and to call back if it did not help.

## 2018-10-13 ENCOUNTER — Ambulatory Visit (INDEPENDENT_AMBULATORY_CARE_PROVIDER_SITE_OTHER): Payer: Self-pay | Admitting: General Surgery

## 2018-10-13 ENCOUNTER — Other Ambulatory Visit: Payer: Self-pay

## 2018-10-13 ENCOUNTER — Encounter: Payer: Self-pay | Admitting: General Surgery

## 2018-10-13 VITALS — BP 111/78 | HR 67 | Temp 97.7°F | Resp 16 | Ht 63.0 in | Wt 277.0 lb

## 2018-10-13 DIAGNOSIS — R740 Nonspecific elevation of levels of transaminase and lactic acid dehydrogenase [LDH]: Secondary | ICD-10-CM

## 2018-10-13 DIAGNOSIS — R7401 Elevation of levels of liver transaminase levels: Secondary | ICD-10-CM

## 2018-10-13 DIAGNOSIS — K439 Ventral hernia without obstruction or gangrene: Secondary | ICD-10-CM

## 2018-10-13 NOTE — Patient Instructions (Addendum)
Continue activity as tolerated. Try to increase lifting slowly if possible. Rest when you need to. Ok to Return to work without restrictions 10/14/2018.

## 2018-10-13 NOTE — Progress Notes (Signed)
[facility] Note   HPI:  33 y.o. Female presents to clinic for follow-up evaluation after a laparoscopic ventral hernia repair with mesh and liver biopsy. She is doing well and her left sided pain has improved greatly. She likely had pain from the trocar site insertion. She has seen Dr. Laural Golden regarding the liver biopsy and is on ursodiol. She has follow up with him for repeat LFTs.  She says she is eating and drinking and having regular Bms.   Review of Systems:  No fever or chills Some diarrhea with ursodiol  All other review of systems: otherwise negative   Vital Signs:  BP 111/78 (BP Location: Left Arm, Patient Position: Sitting, Cuff Size: Normal)   Pulse 67   Temp 97.7 F (36.5 C) (Tympanic)   Resp 16   Ht 5\' 3"  (1.6 m)   Wt 277 lb (125.6 kg)   SpO2 92%   BMI 49.07 kg/m    Physical Exam:  Physical Exam Vitals signs reviewed.  HENT:     Head: Normocephalic.  Cardiovascular:     Rate and Rhythm: Normal rate.  Abdominal:     General: There is no distension.     Palpations: Abdomen is soft.     Tenderness: There is no abdominal tenderness.     Comments: Port sites with no erythema or drainage, healing  Neurological:     Mental Status: She is alert.     Assessment:  33 y.o. yo Female s/p laparoscopic ventral hernia repair with mesh, tubal ligation, and liver biopsy who is doing well. She is ready to go back to work. 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   Plan:  - Return to work 9/2 without restrictions   - Diet and activity as tolerated - Follow up PRN, follow up with Dr. Laural Golden as scheduled   All of the above recommendations were discussed with the patient, and all of patient's questions were answered to her expressed satisfaction.  Curlene Labrum, MD [facility] [address] [place], [place] [address] [phone] (office)

## 2018-12-09 ENCOUNTER — Other Ambulatory Visit (INDEPENDENT_AMBULATORY_CARE_PROVIDER_SITE_OTHER): Payer: Self-pay | Admitting: *Deleted

## 2018-12-09 DIAGNOSIS — K743 Primary biliary cirrhosis: Secondary | ICD-10-CM

## 2018-12-15 ENCOUNTER — Ambulatory Visit (INDEPENDENT_AMBULATORY_CARE_PROVIDER_SITE_OTHER): Payer: Self-pay | Admitting: Internal Medicine

## 2019-02-12 DIAGNOSIS — M8580 Other specified disorders of bone density and structure, unspecified site: Secondary | ICD-10-CM

## 2019-02-12 HISTORY — DX: Other specified disorders of bone density and structure, unspecified site: M85.80

## 2019-10-28 ENCOUNTER — Ambulatory Visit (INDEPENDENT_AMBULATORY_CARE_PROVIDER_SITE_OTHER): Payer: Commercial Managed Care - PPO | Admitting: Gastroenterology

## 2019-10-28 ENCOUNTER — Ambulatory Visit (INDEPENDENT_AMBULATORY_CARE_PROVIDER_SITE_OTHER): Payer: Medicaid Other | Admitting: Gastroenterology

## 2019-10-28 ENCOUNTER — Encounter (INDEPENDENT_AMBULATORY_CARE_PROVIDER_SITE_OTHER): Payer: Self-pay | Admitting: Gastroenterology

## 2019-10-28 ENCOUNTER — Other Ambulatory Visit: Payer: Self-pay

## 2019-10-28 VITALS — BP 105/79 | HR 94 | Temp 98.7°F | Ht 63.0 in | Wt 315.3 lb

## 2019-10-28 DIAGNOSIS — R7989 Other specified abnormal findings of blood chemistry: Secondary | ICD-10-CM | POA: Diagnosis not present

## 2019-10-28 DIAGNOSIS — K743 Primary biliary cirrhosis: Secondary | ICD-10-CM

## 2019-10-28 MED ORDER — URSODIOL 300 MG PO CAPS: 300.0000 mg | ORAL_CAPSULE | Freq: Two times a day (BID) | ORAL | 3 refills | Status: DC

## 2019-10-28 NOTE — Progress Notes (Signed)
 Patient profile: [name] is a 33 y.o. female seen follow up of history of elevated LFTs.  History of Present Illness: [name] is seen today for f/up last seen by Dr Rehman 08/2018 for PBC, and this was initially diagnosed on a liver biopsy at time of a ventral hernia repair, she has had intermittently elevated liver enzymes for many years.  She was started on ursodiol at her last office visit in July 2020.  She has not been seen for scheduled routine follow-up.  She reports she lost insurance and has been off all medications (urso and synthroid) until recently when regained insurance. She has gained significant weight off synthroid. She is feeling fairly well--reports having 3-4 loose stools a day but stools have been looser in past when off meds. She feels in past stools have firmed up after back on meds. No abd pain or blood in stool. Declines further work up of change in bowel habits today.   Patient denies nausea, vomiting, GERD (unless eats pasta sauce), dysphagia, epigastric pain.   Non smoker. No alcohol. NSAIDS only around time of menstrual cycle.   Wt Readings from Last 3 Encounters:  10/28/19 (!) 315 lb 4.8 oz (143 kg)  10/13/18 277 lb (125.6 kg)  09/01/18 267 lb 8 oz (121.3 kg)     Last Colonoscopy: 04/2018-- The examined portion of the ileum was normal. - Congested mucosa in the ascending colon and in the cecum. Biopsied. - External hemorrhoids. Ascending colon biopsy shows lymphoid aggregates but no changes of colitis. Transaminases are down. Mitochondrial antibody is negative. Results have been discussed with Dr. Bridges and I requested if she could do liver biopsy at the time of hernia repair  Last Endoscopy: none prior   Past Medical History:  Past Medical History:  Diagnosis Date  . Breast nodule 10/10/2014  . Breast pain 10/10/2014  . BV (bacterial vaginosis) 11/12/2012  . Depression   . Fatty liver   . Hypothyroid 02/14/2015  . Irregular menses  05/21/2013  . Migraines   . Obesity   . Other and unspecified ovarian cyst 05/31/2013   Right complex ?cystic vs solid mass on ovary will schedule appt with [facility]  . Pregnant 02/14/2015  . Thyroid disease    Reported Hashimoto's Thyroiditis in past    Problem List: Patient Active Problem List   Diagnosis Date Noted  . Primary biliary cholangitis (HCC) 09/01/2018  . Encounter for tubal ligation 08/17/2018  . Elevated liver function tests   . Transaminitis 04/23/2018  . Supraumbilical hernia without gangrene and without obstruction 04/09/2018  . Normal labor 01/31/2018  . Breech presentation 01/18/2018  . History of vaginal delivery following previous cesarean delivery 07/02/2017  . UTI (urinary tract infection) during pregnancy, first trimester 06/23/2017  . Anxiety 05/30/2015  . 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 Previous cesarean section complicating pregnancy 03/07/2015  . S/P right oophorectomy 03/07/2015  . Hypothyroidism 02/14/2015  . Breast pain 10/10/2014    Past Surgical History: Past Surgical History:  Procedure Laterality Date  . BIOPSY  04/17/2018   Procedure: BIOPSY;  Surgeon: Rehman, Najeeb U, MD;  [facility];  Service: Endoscopy;;  colon  . CESAREAN SECTION    . COLONOSCOPY    . COLONOSCOPY WITH PROPOFOL N/A 04/17/2018   Procedure: COLONOSCOPY WITH PROPOFOL;  Surgeon: Rehman, Najeeb U, MD;  [facility]: [facility];  Service: Endoscopy;  Laterality: N/A;  10:50  . LIVER BIOPSY N/A 08/17/2018   Procedure: LIVER BIOPSY;    Surgeon: Virl Cagey, MD;  Location: [facility];  Service: General;  Laterality: N/A;  . OOPHORECTOMY Right   . TONSILLECTOMY    . TUBAL LIGATION Bilateral 08/17/2018   Procedure: LAPAROSCOPIC BILATERAL TUBAL LIGATION WITH FALLOPE RINGS;  Surgeon: Jonnie Kind, MD;  Location: [facility];  Service: Gynecology;  Laterality: Bilateral;  . VENTRAL HERNIA REPAIR N/A 08/17/2018   Procedure: LAPAROSCOPIC VENTRAL HERNIA WITH MESH;  Surgeon: Virl Cagey, MD;  Location: [facility];  Service: General;  Laterality: N/A;  . WISDOM TOOTH EXTRACTION      Allergies: Allergies  Allergen Reactions  . Imitrex [Sumatriptan]     Makes migraines worse.   . Maxalt [Rizatriptan]     Makes migraines worse  . Phenergan [Promethazine Hcl] Nausea And Vomiting    Pill form; IV ok  . Tape Other (See Comments)    Blisters on abdomen after C Section      Home Medications:  Current Outpatient Medications:  .  levothyroxine (SYNTHROID, LEVOTHROID) 150 MCG tablet, Take 1 tablet (150 mcg total) by mouth daily before breakfast. (Patient taking differently: Take 100 mcg by mouth daily before breakfast. ), Disp: 30 tablet, Rfl: 3 .  ursodiol (ACTIGALL) 300 MG capsule, Take 1 capsule (300 mg total) by mouth 2 (two) times daily., Disp: 60 capsule, Rfl: 3   Family History: family history includes Cancer in her maternal aunt and maternal grandmother; Colon cancer in her cousin; Dementia in her paternal grandmother; Diabetes in her maternal grandfather, maternal grandmother, maternal uncle, and mother; Heart attack in her mother; Hypertension in her maternal grandfather, maternal grandmother, and mother; Kidney disease in her paternal grandfather; Leukemia in her maternal grandfather; Other in her father; Ovarian cancer in her cousin; Seizures in her father; Stomach cancer in an other family member; Stroke in her mother.    Social History:   reports that she quit smoking about 9 years ago. Her smoking use included cigarettes. She has a 2.00 pack-year smoking history. She has never used smokeless tobacco. She reports that she does not drink alcohol and does not use drugs.   Review of Systems: Constitutional: Denies weight loss/weight gain  Eyes: No changes in vision. ENT: No oral lesions, sore throat.  GI: see HPI.  Heme/Lymph: No easy bruising.  CV: No chest pain.  GU: No hematuria.  Integumentary: No rashes.  Neuro: No headaches.  Psych: No depression/anxiety.    Endocrine: No heat/cold intolerance.  Allergic/Immunologic: No urticaria.  Resp: No cough, SOB.  Musculoskeletal: No joint swelling.    Physical Examination: BP 105/79 (BP Location: Right Arm, Patient Position: Sitting, Cuff Size: Large)   Pulse 94   Temp 98.7 F (37.1 C) (Oral)   Ht 5' 3" (1.6 m)   Wt (!) 315 lb 4.8 oz (143 kg)   BMI 55.85 kg/m  Gen: NAD, alert and oriented x 4 HEENT: PEERLA, EOMI, Neck: supple, no JVD Chest: CTA bilaterally, no wheezes, crackles, or other adventitious sounds CV: RRR, no m/g/c/r Abd: soft, NT, ND, +BS in all four quadrants; no HSM, guarding, ridigity, or rebound tenderness Ext: no edema, well perfused with 2+ pulses, Skin: no rash or lesions noted on observed skin Lymph: no noted LAD  Data Reviewed:  July 2020-hep C antibody negative, hep B surface antibody positive.  ANA negative.  Immunoglobulin panel normal.  ACE level normal.  Anti-smooth muscle antibody 48.  Sed rate 28  Liver bx path - Liver, biopsy 08/2018 - PATCHY, PORTAL-BASED FIBROINFLAMMATORY CHANGES AND NON-NECROTIZING  GRANULOMAS, MOST CONSISTENT WITH AUTOIMMUNE CHOLANGIOPATHY (AMA NEGATIVE PRIMARY BILIARY CHOLANGITIS) OR PBC-LIKE LESION; SEE NOTE - MILD PORTAL FIBROSIS   Assessment/Plan: Ms. Cellucci is a 33 y.o. female seen for   Follow-up of elevated liver enzymes  1.  Elevated alk phos-has been previously elevated to 683 with AST 171, ALT 172 last year, started urso and had insomnia on TID dosing. Clinically did well on BID until lost insurance. Now off all therapy about a year. Will check LFTS today & restart Urso. If LFTS do not trend down on Urso after 3 months consider additional therapy.   Case discussed w/ Dr Rehman. Further recs pending lab results.   [name] was seen today for follow-up.  Diagnoses and all orders for this visit:  Primary biliary cholangitis (HCC) -     CBC with Differential -     COMPLETE METABOLIC PANEL WITH GFR -     INR/PT -     Anti-smooth  muscle antibody, IgG  Elevated LFTs -     CBC with Differential -     COMPLETE METABOLIC PANEL WITH GFR -     INR/PT -     Anti-smooth muscle antibody, IgG  Other orders -     ursodiol (ACTIGALL) 300 MG capsule; Take 1 capsule (300 mg total) by mouth 2 (two) times daily.       I personally performed the service, non-incident to. (WP)   , PA-C [facility] 

## 2019-10-28 NOTE — Patient Instructions (Addendum)
We will request her labs from PCP.  We will restart Urso at twice a day.  Please contact me if you have any side effects.  If diarrhea continues important contact me and we will check to ensure no infectious cause.

## 2019-10-31 LAB — COMPLETE METABOLIC PANEL WITH GFR
AG Ratio: 1.1 (calc) (ref 1.0–2.5)
ALT: 129 U/L — ABNORMAL HIGH (ref 6–29)
AST: 90 U/L — ABNORMAL HIGH (ref 10–30)
Albumin: 3.9 g/dL (ref 3.6–5.1)
Alkaline phosphatase (APISO): 579 U/L — ABNORMAL HIGH (ref 31–125)
BUN: 10 mg/dL (ref 7–25)
CO2: 24 mmol/L (ref 20–32)
Calcium: 8.8 mg/dL (ref 8.6–10.2)
Chloride: 106 mmol/L (ref 98–110)
Creat: 0.57 mg/dL (ref 0.50–1.10)
GFR, Est African American: 143 mL/min/{1.73_m2} (ref >=60)
GFR, Est Non African American: 124 mL/min/{1.73_m2} (ref >=60)
Globulin: 3.4 g/dL (calc) (ref 1.9–3.7)
Glucose, Bld: 84 mg/dL (ref 65–99)
Potassium: 4.3 mmol/L (ref 3.5–5.3)
Sodium: 138 mmol/L (ref 135–146)
Total Bilirubin: 0.7 mg/dL (ref 0.2–1.2)
Total Protein: 7.3 g/dL (ref 6.1–8.1)

## 2019-10-31 LAB — CBC WITH DIFFERENTIAL/PLATELET
Absolute Monocytes: 416 cells/uL (ref 200–950)
Basophils Absolute: 149 cells/uL (ref 0–200)
Basophils Relative: 1.5 %
Eosinophils Absolute: 535 cells/uL — ABNORMAL HIGH (ref 15–500)
Eosinophils Relative: 5.4 %
HCT: 39.3 % (ref 35.0–45.0)
Hemoglobin: 13.2 g/dL (ref 11.7–15.5)
Lymphs Abs: 3722 cells/uL (ref 850–3900)
MCH: 28.6 pg (ref 27.0–33.0)
MCHC: 33.6 g/dL (ref 32.0–36.0)
MCV: 85.1 fL (ref 80.0–100.0)
MPV: 9.3 fL (ref 7.5–12.5)
Monocytes Relative: 4.2 %
Neutro Abs: 5079 cells/uL (ref 1500–7800)
Neutrophils Relative %: 51.3 %
Platelets: 366 10*3/uL (ref 140–400)
RBC: 4.62 10*6/uL (ref 3.80–5.10)
RDW: 13 % (ref 11.0–15.0)
Total Lymphocyte: 37.6 %
WBC: 9.9 10*3/uL (ref 3.8–10.8)

## 2019-10-31 LAB — PROTIME-INR
INR: 0.9
Prothrombin Time: 10 s (ref 9.0–11.5)

## 2019-10-31 LAB — ANTI-SMOOTH MUSCLE ANTIBODY, IGG: Actin (Smooth Muscle) Antibody (IGG): 60 U — ABNORMAL HIGH (ref <20)

## 2019-12-14 ENCOUNTER — Encounter (INDEPENDENT_AMBULATORY_CARE_PROVIDER_SITE_OTHER): Payer: Self-pay

## 2020-01-24 ENCOUNTER — Other Ambulatory Visit: Payer: Self-pay

## 2020-01-24 ENCOUNTER — Encounter (INDEPENDENT_AMBULATORY_CARE_PROVIDER_SITE_OTHER): Payer: Self-pay | Admitting: Gastroenterology

## 2020-01-24 ENCOUNTER — Ambulatory Visit (INDEPENDENT_AMBULATORY_CARE_PROVIDER_SITE_OTHER): Payer: Commercial Managed Care - PPO | Admitting: Gastroenterology

## 2020-01-24 VITALS — BP 97/76 | HR 84 | Temp 98.1°F | Ht 63.0 in | Wt 315.0 lb

## 2020-01-24 DIAGNOSIS — G8929 Other chronic pain: Secondary | ICD-10-CM | POA: Diagnosis not present

## 2020-01-24 DIAGNOSIS — K743 Primary biliary cirrhosis: Secondary | ICD-10-CM | POA: Diagnosis not present

## 2020-01-24 DIAGNOSIS — R1013 Epigastric pain: Secondary | ICD-10-CM

## 2020-01-24 MED ORDER — OMEPRAZOLE 40 MG PO CPDR: 40.0000 mg | DELAYED_RELEASE_CAPSULE | Freq: Every day | ORAL | 3 refills | Status: DC

## 2020-01-24 MED ORDER — URSODIOL 300 MG PO CAPS: 900.0000 mg | ORAL_CAPSULE | Freq: Two times a day (BID) | ORAL | 3 refills | Status: DC

## 2020-01-24 NOTE — Progress Notes (Signed)
[name], M.D. Gastroenterology & Hepatology [facility] [address] [place], [place] [address]  Primary Care Physician: Lisa Chimera, MD [address] [place] [address]  I will communicate my assessment and recommendations to the referring MD via EMR. "Note: Occasional unusual wording and randomly placed punctuation marks may result from the use of speech recognition technology to transcribe this document"  Problems: 1. Seronegative PBC  History of Present Illness: [name] is a 34 y.o. female with past medical history of seronegative PBC, Hashimoto thyroiditis, obesity, migraines and depression, who presents for follow up of seronegative PBC.  The patient was last seen on 10/28/2019. At that time, the patient was started on ursodiol 300 mg twice daily as she lost insurance in the past.  Patient reports that for the last 3 weeks she has presented intermittent episodes of stabbing pain in the epigastric area that does not radiate anywhere else. She reports it is worse if she moves fast. She states that it occurs 1-2 times a day and last a couple of seconds. No nighttime symptoms. The patient denies having any nausea, vomiting, fever, chills, hematochezia, melena, hematemesis, abdominal distention, diarrhea, jaundice, pruritus or weight loss.  She has tolerated the ursodiol adequately at current dose and has been compliant with the medication.  Denies any other complaints.  Most recent available blood tests were from 10/28/2019, alkaline phosphatase was 579, ALT 129, AST 90, total bilirubin 0.7, normal renal function and electrolytes.  CBC showed although cell count of 9.9, hemoglobin 13.2 and platelets 366.  Last Colonoscopy: 04/17/2018 - normal TI, congested mucosa in AC and cecum (Bx with normal tissue, lymph aggregates), external hemorrhoids  Past Medical History: Past Medical History:  Diagnosis Date  . Breast nodule  10/10/2014  . Breast pain 10/10/2014  . BV (bacterial vaginosis) 11/12/2012  . Depression   . Fatty liver   . Hypothyroid 02/14/2015  . Irregular menses 05/21/2013  . Migraines   . Obesity   . Other and unspecified ovarian cyst 05/31/2013   Right complex ?cystic vs solid mass on ovary will schedule appt with J[facility]  . Pregnant 02/14/2015  . Thyroid disease    Reported Hashimoto's Thyroiditis in past    Past Surgical History: Past Surgical History:  Procedure Laterality Date  . BIOPSY  04/17/2018   Procedure: BIOPSY;  Surgeon: Malissa Hippo, MD;  [facility];  Service: Endoscopy;;  colon  . CESAREAN SECTION    . COLONOSCOPY    . COLONOSCOPY WITH PROPOFOL N/A 04/17/2018   Procedure: COLONOSCOPY WITH PROPOFOL;  Surgeon: Malissa Hippo, MD;  [facility];  Service: Endoscopy;  Laterality: N/A;  10:50  . LIVER BIOPSY N/A 08/17/2018   Procedure: LIVER BIOPSY;  Surgeon: Lucretia Roers, MD;  Location: [facility];  Service: General;  Laterality: N/A;  . OOPHORECTOMY Right   . TONSILLECTOMY    . TUBAL LIGATION Bilateral 08/17/2018   Procedure: LAPAROSCOPIC BILATERAL TUBAL LIGATION WITH FALLOPE RINGS;  Surgeon: Tilda Burrow, MD;  Location: [facility];  Service: Gynecology;  Laterality: Bilateral;  . VENTRAL HERNIA REPAIR N/A 08/17/2018   Procedure: LAPAROSCOPIC VENTRAL HERNIA WITH MESH;  Surgeon: Lucretia Roers, MD;  Location: [facility];  Service: General;  Laterality: N/A;  . WISDOM TOOTH EXTRACTION      Family History: Family History  Problem Relation Age of Onset  . Diabetes Maternal Grandmother   . Hypertension Maternal Grandmother   .  Cancer Maternal Grandmother        breast  . Diabetes Maternal Grandfather   . Hypertension Maternal Grandfather   . Leukemia Maternal Grandfather   . Diabetes Mother   . Hypertension Mother   . Heart attack Mother   . Stroke Mother   . Kidney disease Paternal Grandfather   . Seizures Father   . Other Father        back problems  .  Dementia Paternal Grandmother   . Cancer Maternal Aunt   . Diabetes Maternal Uncle   . Colon cancer Cousin   . Stomach cancer Other   . Ovarian cancer Cousin   . Ulcerative colitis Neg Hx   . Crohn's disease Neg Hx     Social History: Social History   Tobacco Use  Smoking Status Former Smoker  . Packs/day: 0.25  . Years: 8.00  . Pack years: 2.00  . Types: Cigarettes  . Quit date: 10/07/2010  . Years since quitting: 9.3  Smokeless Tobacco Never Used   Social History   Substance and Sexual Activity  Alcohol Use No   Comment: rare   Social History   Substance and Sexual Activity  Drug Use No    Allergies: Allergies  Allergen Reactions  . Imitrex [Sumatriptan]     Makes migraines worse.   . Maxalt [Rizatriptan]     Makes migraines worse  . Phenergan [Promethazine Hcl] Nausea And Vomiting    Pill form; IV ok  . Tape Other (See Comments)    Blisters on abdomen after C Section    Medications: Current Outpatient Medications  Medication Sig Dispense Refill  . levothyroxine (SYNTHROID, LEVOTHROID) 150 MCG tablet Take 1 tablet (150 mcg total) by mouth daily before breakfast. (Patient taking differently: Take 100 mcg by mouth daily before breakfast.) 30 tablet 3  . omeprazole (PRILOSEC) 40 MG capsule Take 1 capsule (40 mg total) by mouth daily. 90 capsule 3  . ursodiol (ACTIGALL) 300 MG capsule Take 3 capsules (900 mg total) by mouth 2 (two) times daily. 180 capsule 3   No current facility-administered medications for this visit.    Review of Systems: GENERAL: negative for malaise, night sweats HEENT: No changes in hearing or vision, no nose bleeds or other nasal problems. NECK: Negative for lumps, goiter, pain and significant neck swelling RESPIRATORY: Negative for cough, wheezing CARDIOVASCULAR: Negative for chest pain, leg swelling, palpitations, orthopnea GI: SEE HPI MUSCULOSKELETAL: Negative for joint pain or swelling, back pain, and muscle pain. SKIN:  Negative for lesions, rash PSYCH: Negative for sleep disturbance, mood disorder and recent psychosocial stressors. HEMATOLOGY Negative for prolonged bleeding, bruising easily, and swollen nodes. ENDOCRINE: Negative for cold or heat intolerance, polyuria, polydipsia and goiter. NEURO: negative for tremor, gait imbalance, syncope and seizures. The remainder of the review of systems is noncontributory.   Physical Exam: BP 97/76 (BP Location: Left Arm, Patient Position: Sitting, Cuff Size: Large)   Pulse 84   Temp 98.1 F (36.7 C) (Oral)   Ht 5\' 3"  (1.6 m)   Wt (!) 315 lb (142.9 kg)   BMI 55.80 kg/m  GENERAL: The patient is AO x3, in no acute distress.  Obese. HEENT: Head is normocephalic and atraumatic. EOMI are intact. Mouth is well hydrated and without lesions. NECK: Supple. No masses LUNGS: Clear to auscultation. No presence of rhonchi/wheezing/rales. Adequate chest expansion HEART: RRR, normal s1 and s2. ABDOMEN: Soft, nontender, no guarding, no peritoneal signs, and nondistended. BS +. No masses. EXTREMITIES: Without any cyanosis,  clubbing, rash, lesions or edema. NEUROLOGIC: AOx3, no focal motor deficit. SKIN: no jaundice, no rashes  Imaging/Labs: as above  I personally reviewed and interpreted the available labs, imaging and endoscopic files.  Impression and Plan: NOMA QUIJAS is a 34 y.o. female with past medical history of seronegative PBC, Hashimoto thyroiditis, obesity, migraines and depression, who presents for follow up of seronegative PBC.  The patient has been taking her medication compliantly for management of her PBC.  However upon review of the dosage of the medication based on her weight, this is subtherapeutic.  She should be at least receiving 1800 mg/day of UDCA (13 to 15 mg/kg).  I explained this to the patient.  I will obtain baseline labs today but will increase her dosage to 900 mg twice daily as she presented significant insomnia when the was given 3 times  a day.  We will also check lipid panel and DEXA scan as patient with previously have a high risk of hyperlipidemia and osteoporosis.  Patient understood and agreed.  We will obtain repeat labs 3 months later to assess if there was response to the dosage.  Finally, the patient has presented new onset abdominal pain which is unclear in etiology, she had a CT of the abdomen with contrast a year ago that did not show any major alterations in her upper abdomen but I consider given that she has not presented any red flag signs repeating her imaging will not be helpful at this moment.  We will start her on omeprazole 40 mg every day and we will evaluate the need to perform an EGD if there is no response to the medication.  - Increase ursodiol to 900 mg BID - Start omeprazole 40 mg qday - Schedule DEXA scan - Check CBC, CMP and lipid panel -Return to clinic in 3 months  All questions were answered.      Dolores Frame, MD Gastroenterology and Hepatology [facility]

## 2020-01-24 NOTE — Patient Instructions (Addendum)
Increase ursodiol to 900 mg BID Start omeprazole 40 mg qday Schedule DEXA scan Perform blood workup

## 2020-01-25 ENCOUNTER — Ambulatory Visit (HOSPITAL_COMMUNITY)
Admission: RE | Admit: 2020-01-25 | Discharge: 2020-01-25 | Disposition: A | Discharge status: Home / Self Care | Payer: Commercial Managed Care - PPO | Source: Ambulatory Visit | Attending: Gastroenterology | Admitting: Gastroenterology

## 2020-01-25 ENCOUNTER — Other Ambulatory Visit (INDEPENDENT_AMBULATORY_CARE_PROVIDER_SITE_OTHER): Payer: Self-pay | Admitting: Gastroenterology

## 2020-01-25 ENCOUNTER — Encounter (HOSPITAL_COMMUNITY): Payer: Self-pay | Admitting: Radiology

## 2020-01-25 DIAGNOSIS — M8580 Other specified disorders of bone density and structure, unspecified site: Secondary | ICD-10-CM

## 2020-01-25 DIAGNOSIS — K743 Primary biliary cirrhosis: Secondary | ICD-10-CM | POA: Insufficient documentation

## 2020-01-25 LAB — COMPREHENSIVE METABOLIC PANEL
AG Ratio: 1 (calc) (ref 1.0–2.5)
ALT: 95 U/L — ABNORMAL HIGH (ref 6–29)
AST: 58 U/L — ABNORMAL HIGH (ref 10–30)
Albumin: 3.6 g/dL (ref 3.6–5.1)
Alkaline phosphatase (APISO): 561 U/L — ABNORMAL HIGH (ref 31–125)
BUN: 10 mg/dL (ref 7–25)
CO2: 27 mmol/L (ref 20–32)
Calcium: 9 mg/dL (ref 8.6–10.2)
Chloride: 104 mmol/L (ref 98–110)
Creat: 0.68 mg/dL (ref 0.50–1.10)
Globulin: 3.7 g/dL (calc) (ref 1.9–3.7)
Glucose, Bld: 81 mg/dL (ref 65–99)
Potassium: 4.1 mmol/L (ref 3.5–5.3)
Sodium: 138 mmol/L (ref 135–146)
Total Bilirubin: 0.7 mg/dL (ref 0.2–1.2)
Total Protein: 7.3 g/dL (ref 6.1–8.1)

## 2020-01-25 LAB — LIPID PANEL
Cholesterol: 181 mg/dL (ref <200)
HDL: 57 mg/dL (ref >=50)
LDL Cholesterol (Calc): 104 mg/dL (calc) — ABNORMAL HIGH
Non-HDL Cholesterol (Calc): 124 mg/dL (calc) (ref <130)
Total CHOL/HDL Ratio: 3.2 (calc) (ref <5.0)
Triglycerides: 105 mg/dL (ref <150)

## 2020-01-25 LAB — CBC WITH DIFFERENTIAL/PLATELET
Absolute Monocytes: 461 cells/uL (ref 200–950)
Basophils Absolute: 113 cells/uL (ref 0–200)
Basophils Relative: 1.2 %
Eosinophils Absolute: 498 cells/uL (ref 15–500)
Eosinophils Relative: 5.3 %
HCT: 40.3 % (ref 35.0–45.0)
Hemoglobin: 13.6 g/dL (ref 11.7–15.5)
Lymphs Abs: 3619 cells/uL (ref 850–3900)
MCH: 28.9 pg (ref 27.0–33.0)
MCHC: 33.7 g/dL (ref 32.0–36.0)
MCV: 85.6 fL (ref 80.0–100.0)
MPV: 9.3 fL (ref 7.5–12.5)
Monocytes Relative: 4.9 %
Neutro Abs: 4709 cells/uL (ref 1500–7800)
Neutrophils Relative %: 50.1 %
Platelets: 388 10*3/uL (ref 140–400)
RBC: 4.71 10*6/uL (ref 3.80–5.10)
RDW: 13.3 % (ref 11.0–15.0)
Total Lymphocyte: 38.5 %
WBC: 9.4 10*3/uL (ref 3.8–10.8)

## 2020-01-25 MED ORDER — CALCIUM CARBONATE-VITAMIN D 500-200 MG-UNIT PO TABS: 1.0000 | ORAL_TABLET | Freq: Every day | ORAL | 1 refills | Status: AC

## 2020-01-31 ENCOUNTER — Ambulatory Visit (INDEPENDENT_AMBULATORY_CARE_PROVIDER_SITE_OTHER): Payer: Commercial Managed Care - PPO | Admitting: Gastroenterology

## 2020-04-08 IMAGING — CT CT ANGIOGRAPHY CHEST
2 of 6 series · 18 of 46 positions shown · IV contrast (Isovue)
Comparison: None.

CLINICAL DATA: Shortness of breath recent hernia repair

EXAM:
CT ANGIOGRAPHY CHEST WITH CONTRAST
TECHNIQUE: Multidetector CT imaging of the chest was performed using the
standard protocol during bolus administration of intravenous
contrast. Multiplanar CT image reconstructions and MIPs were
obtained to evaluate the vascular anatomy.
CONTRAST:  100mL OMNIPAQUE IOHEXOL 350 MG/ML SOLN

[Series 5: 3 thins · axial · 0.80mm/px · z∈[+867,+1098]mm · 15 of 255 slices shown]
[im 12/255  lung]
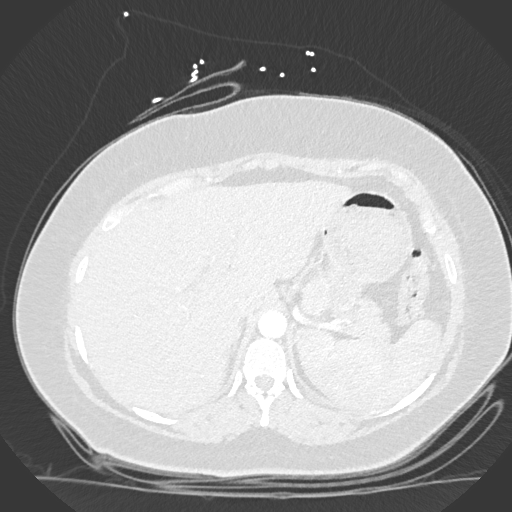
[im 34/255  soft-tissue]
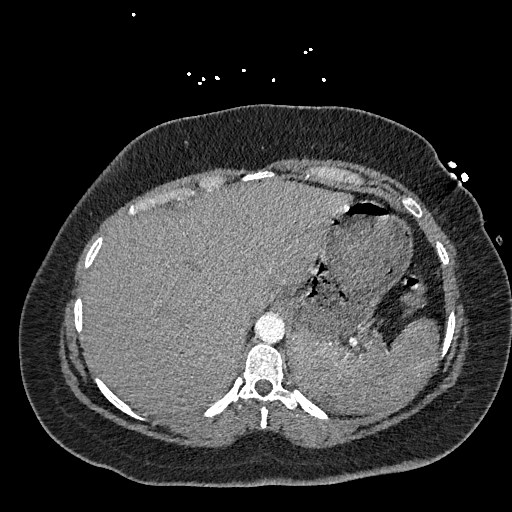
[im 45/255  lung]
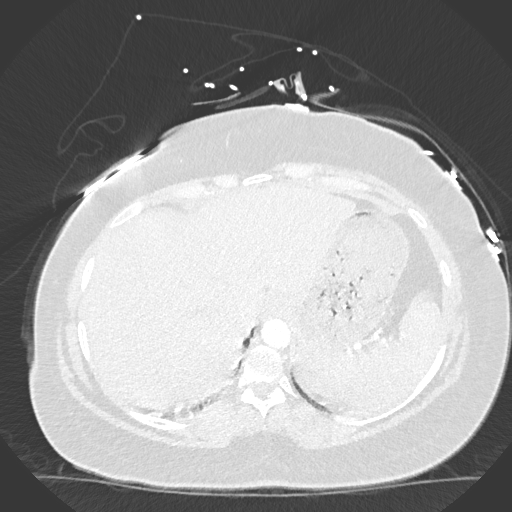
[im 67/255  soft-tissue]
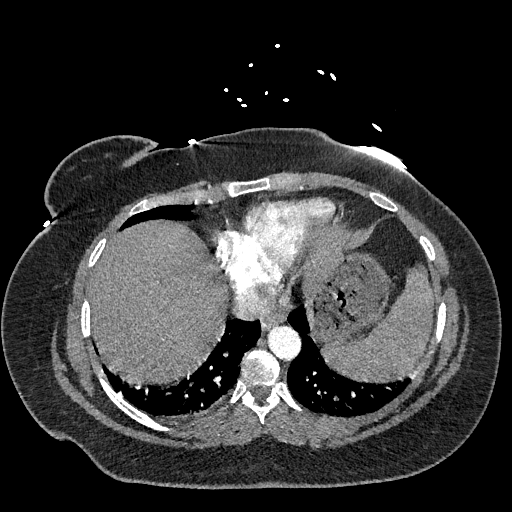
[im 78/255  lung]
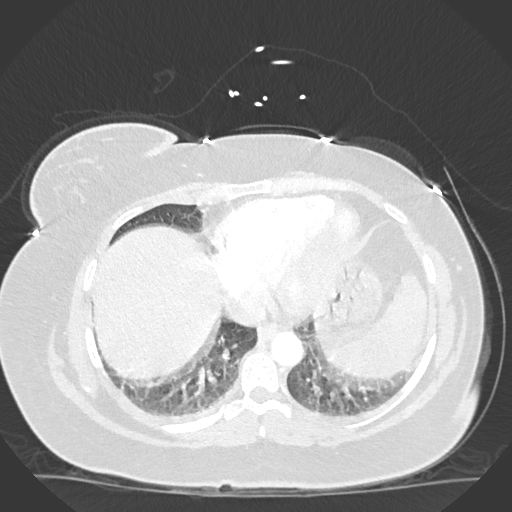
[im 100/255  soft-tissue]
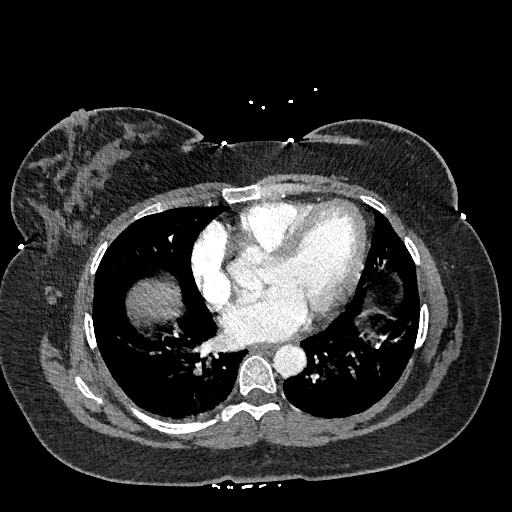
[im 111/255  lung]
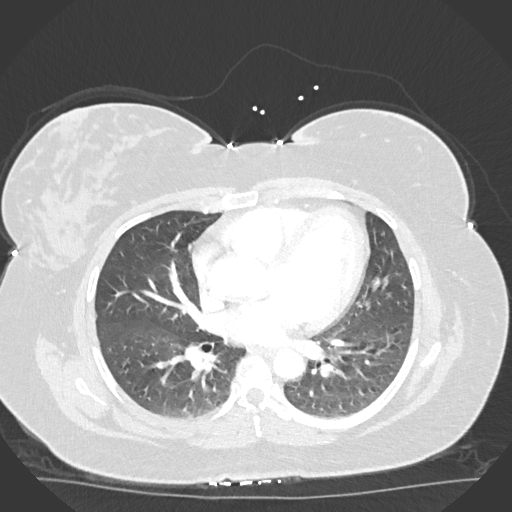
[im 133/255  soft-tissue]
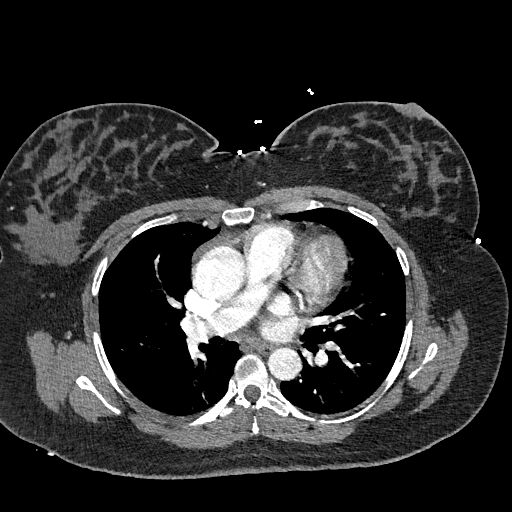
[im 144/255  lung]
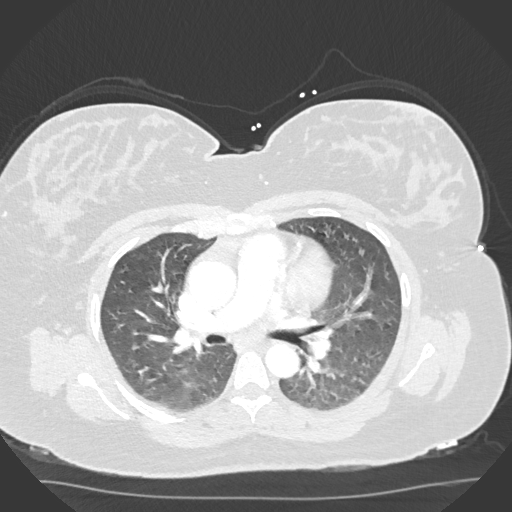
[im 155/255  soft-tissue]
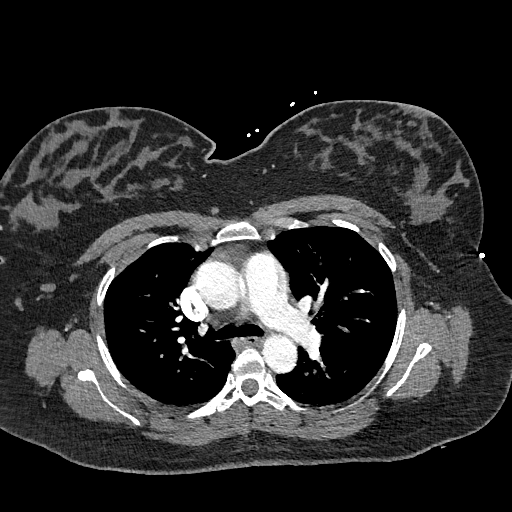
[im 177/255  lung]
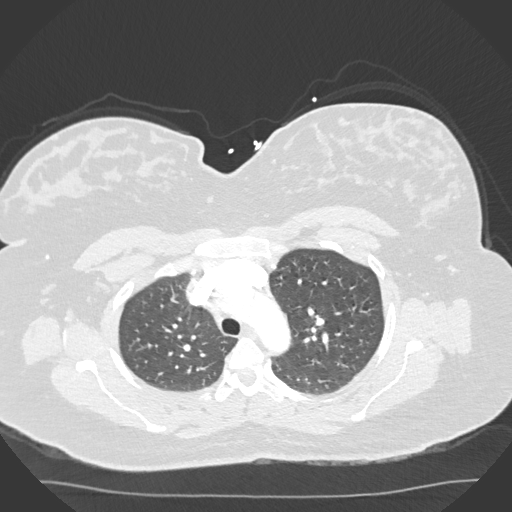
[im 188/255  soft-tissue]
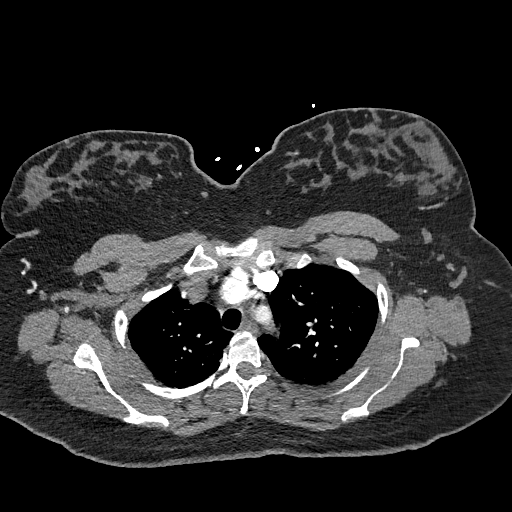
[im 210/255  lung]
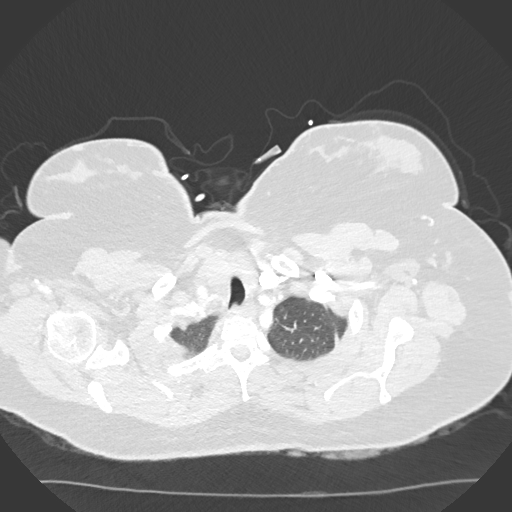
[im 221/255  soft-tissue]
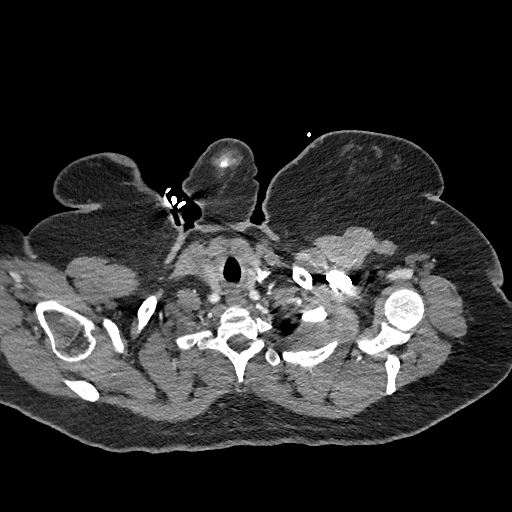
[im 243/255  lung]
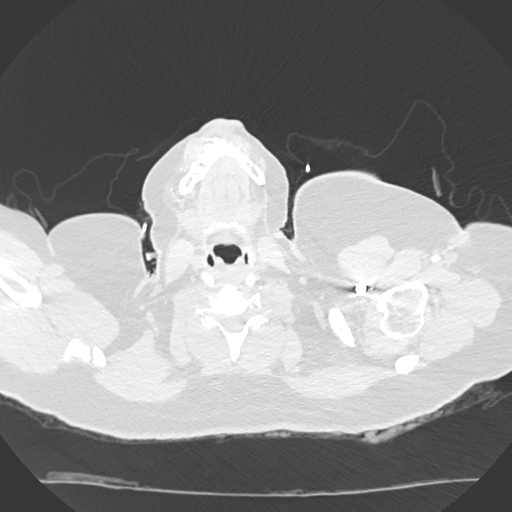

[Series 7: coronal mpr · coronal · 0.51mm/px · 3 of 151 slices shown]
[im 38/151  soft-tissue]
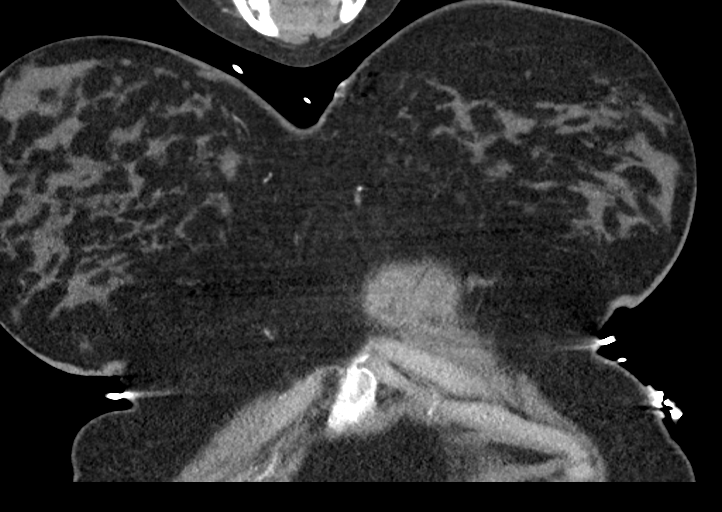
[im 76/151  soft-tissue]
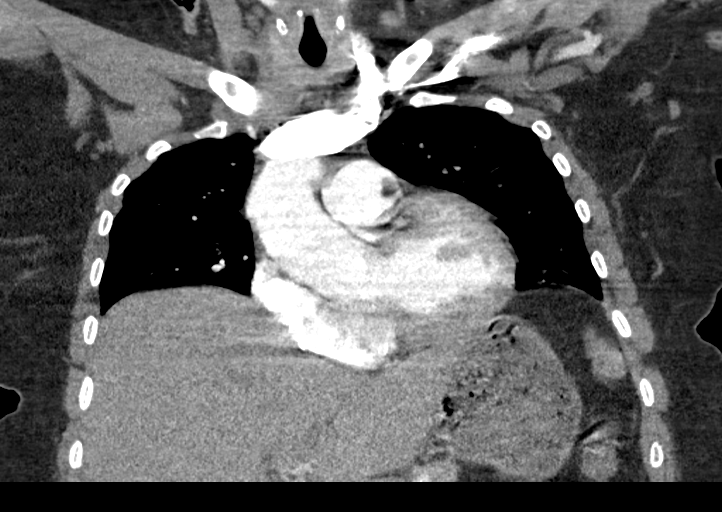
[im 113/151  soft-tissue]
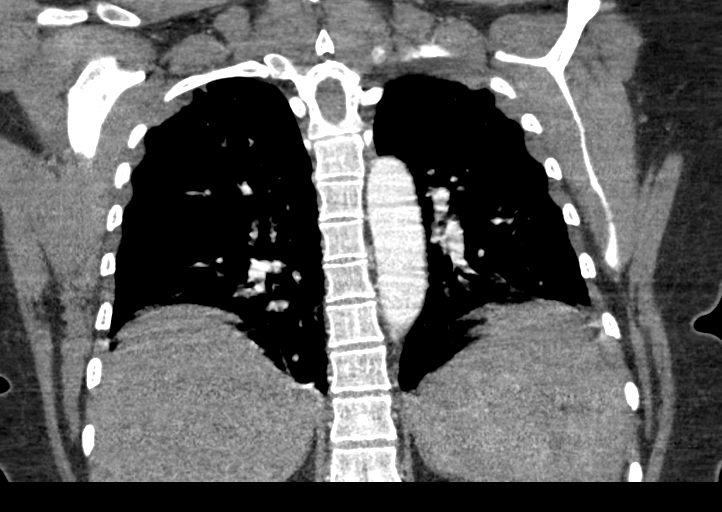

[18 of 46 positions shown; findings below may reference images not displayed]

FINDINGS: Cardiovascular: Satisfactory opacification of the pulmonary arteries
to the segmental level. Nondiagnostic for evaluation subsegmental
PE, particularly within the lower lobes, secondary to respiratory
motion. Aorta is nonaneurysmal. Heart size upper normal. No
pericardial effusion

Mediastinum/Nodes: No enlarged mediastinal, hilar, or axillary lymph
nodes. Thyroid gland, trachea, and esophagus demonstrate no
significant findings.

Lungs/Pleura: Lungs are clear. No pleural effusion or pneumothorax.

Upper Abdomen: No acute abnormality.

Musculoskeletal: No chest wall abnormality. No acute or significant
osseous findings.

Review of the MIP images confirms the above findings.
IMPRESSION: 1. Limited evaluation for subsegmental emboli in the lower lobes
secondary to respiratory motion. No acute central embolus is seen.
2. Clear lung fields

## 2020-04-27 ENCOUNTER — Ambulatory Visit (INDEPENDENT_AMBULATORY_CARE_PROVIDER_SITE_OTHER): Payer: Self-pay | Admitting: Gastroenterology

## 2020-04-27 ENCOUNTER — Encounter (INDEPENDENT_AMBULATORY_CARE_PROVIDER_SITE_OTHER): Payer: Self-pay

## 2020-04-27 ENCOUNTER — Telehealth (INDEPENDENT_AMBULATORY_CARE_PROVIDER_SITE_OTHER): Payer: Self-pay

## 2020-04-27 ENCOUNTER — Encounter (INDEPENDENT_AMBULATORY_CARE_PROVIDER_SITE_OTHER): Payer: Self-pay | Admitting: Gastroenterology

## 2020-04-27 ENCOUNTER — Other Ambulatory Visit: Payer: Self-pay

## 2020-04-27 ENCOUNTER — Other Ambulatory Visit (INDEPENDENT_AMBULATORY_CARE_PROVIDER_SITE_OTHER): Payer: Self-pay

## 2020-04-27 VITALS — BP 105/80 | HR 72 | Temp 98.1°F | Ht 63.0 in | Wt 310.0 lb

## 2020-04-27 DIAGNOSIS — R1013 Epigastric pain: Secondary | ICD-10-CM

## 2020-04-27 DIAGNOSIS — K743 Primary biliary cirrhosis: Secondary | ICD-10-CM

## 2020-04-27 DIAGNOSIS — K529 Noninfective gastroenteritis and colitis, unspecified: Secondary | ICD-10-CM

## 2020-04-27 DIAGNOSIS — G8929 Other chronic pain: Secondary | ICD-10-CM

## 2020-04-27 MED ORDER — URSODIOL 300 MG PO CAPS: 900.0000 mg | ORAL_CAPSULE | Freq: Two times a day (BID) | ORAL | 3 refills | Status: DC

## 2020-04-27 MED ORDER — PEG 3350-KCL-NA BICARB-NACL 420 G PO SOLR: 4000.0000 mL | ORAL | 0 refills | Status: DC

## 2020-04-27 NOTE — H&P (View-Only) (Signed)
[name], M.D. Gastroenterology & Hepatology [facility] [address] [place], [place] [address]  Primary Care Physician: Richardean Chimera, MD [address] [place] [address]  I will communicate my assessment and recommendations to the referring MD via EMR.  Problems: 1. Seronegative PBC 2. Chronic diarrhea 3. Osteopenia  History of Present Illness: [name] is a 35 y.o. female with past medical history of seronegative PBC, Hashimoto thyroiditis, obesity, migraines and depression, who presents for follow up of PBC and evaluation of chronic diarrhea.  The patient was last seen on 01/24/2020. At that time, the patient had her ursodiol increased to 900 mg BID (therapeutic dose) was started on omeprazole 40 mg for abdominal pain and has her baseline blood tests checked including lipid panel.  Also had a DEXA scan which showed osteopenia - currently on Vitamin D and calcium supplementation.  Patient reports that for the last year she has presented at least 5-8 bowel movements per day, usually watery to soft but occasionally is formed. She denies having any melena or hematochezia, but has noted the stool "sticks to the toilet but does not float". She states that she has also presented some upper abdominal pain before having a BM, and it resolves after she defecates. She is not following any type of diet. She feels bloating intermittently. Believes Prilosec has helped her improve her symptoms but not resolved them.  Notably, she had biopsy-proven PBC with questionable PSC changes.  She had positive ASMA but her IgG levels were normal.  Patient tolerating her medication adequately, taking it compliantly.  The patient denies having any nausea, vomiting, fever, chills, hematochezia, melena, hematemesis, abdominal distention, jaundice, pruritus or weight loss.  Last EGD: never  Last Colonoscopy: 2020  Past Medical History: Past  Medical History:  Diagnosis Date  . Breast nodule 10/10/2014  . Breast pain 10/10/2014  . BV (bacterial vaginosis) 11/12/2012  . Depression   . Fatty liver   . Hypothyroid 02/14/2015  . Irregular menses 05/21/2013  . Migraines   . Obesity   . Other and unspecified ovarian cyst 05/31/2013   Right complex ?cystic vs solid mass on ovary will schedule appt with JVF  . Pregnant 02/14/2015  . Thyroid disease    Reported Hashimoto's Thyroiditis in past    Past Surgical History: Past Surgical History:  Procedure Laterality Date  . BIOPSY  04/17/2018   Procedure: BIOPSY;  Surgeon: Malissa Hippo, MD;  [facility];  Service: Endoscopy;;  colon  . CESAREAN SECTION    . COLONOSCOPY    . COLONOSCOPY WITH PROPOFOL N/A 04/17/2018   Procedure: COLONOSCOPY WITH PROPOFOL;  Surgeon: Malissa Hippo, MD;  [facility]: [facility];  Service: Endoscopy;  Laterality: N/A;  10:50  . LIVER BIOPSY N/A 08/17/2018   Procedure: LIVER BIOPSY;  Surgeon: Lucretia Roers, MD;  Location: [facility];  Service: General;  Laterality: N/A;  . OOPHORECTOMY Right   . TONSILLECTOMY    . TUBAL LIGATION Bilateral 08/17/2018   Procedure: LAPAROSCOPIC BILATERAL TUBAL LIGATION WITH FALLOPE RINGS;  Surgeon: Tilda Burrow, MD;  Location: [facility];  Service: Gynecology;  Laterality: Bilateral;  . VENTRAL HERNIA REPAIR N/A 08/17/2018   Procedure: LAPAROSCOPIC VENTRAL HERNIA WITH MESH;  Surgeon: Lucretia Roers, MD;  Location: [facility];  Service: General;  Laterality: N/A;  . WISDOM TOOTH EXTRACTION      Family History: Family History  Problem Relation Age of Onset  .  Diabetes Maternal Grandmother   . Hypertension Maternal Grandmother   . Cancer Maternal Grandmother        breast  . Diabetes Maternal Grandfather   . Hypertension Maternal Grandfather   . Leukemia Maternal Grandfather   . Diabetes Mother   . Hypertension Mother   . Heart attack Mother   . Stroke Mother   . Kidney disease Paternal Grandfather   .  Seizures Father   . Other Father        back problems  . Dementia Paternal Grandmother   . Cancer Maternal Aunt   . Diabetes Maternal Uncle   . Colon cancer Cousin   . Stomach cancer Other   . Ovarian cancer Cousin   . Ulcerative colitis Neg Hx   . Crohn's disease Neg Hx     Social History: Social History   Tobacco Use  Smoking Status Former Smoker  . Packs/day: 0.25  . Years: 8.00  . Pack years: 2.00  . Types: Cigarettes  . Quit date: 10/07/2010  . Years since quitting: 9.5  Smokeless Tobacco Never Used   Social History   Substance and Sexual Activity  Alcohol Use No   Comment: rare   Social History   Substance and Sexual Activity  Drug Use No    Allergies: Allergies  Allergen Reactions  . Imitrex [Sumatriptan]     Makes migraines worse.   . Maxalt [Rizatriptan]     Makes migraines worse  . Phenergan [Promethazine Hcl] Nausea And Vomiting    Pill form; IV ok  . Tape Other (See Comments)    Blisters on abdomen after C Section    Medications: Current Outpatient Medications  Medication Sig Dispense Refill  . calcium-vitamin D (OSCAL WITH D) 500-200 MG-UNIT tablet Take 1 tablet by mouth daily with breakfast. 90 tablet 1  . levothyroxine (SYNTHROID, LEVOTHROID) 150 MCG tablet Take 1 tablet (150 mcg total) by mouth daily before breakfast. (Patient taking differently: Take 100 mcg by mouth daily before breakfast.) 30 tablet 3  . omeprazole (PRILOSEC) 40 MG capsule Take 1 capsule (40 mg total) by mouth daily. 90 capsule 3  . ursodiol (ACTIGALL) 300 MG capsule Take 3 capsules (900 mg total) by mouth 2 (two) times daily. 180 capsule 3   No current facility-administered medications for this visit.    Review of Systems: GENERAL: negative for malaise, night sweats HEENT: No changes in hearing or vision, no nose bleeds or other nasal problems. NECK: Negative for lumps, goiter, pain and significant neck swelling RESPIRATORY: Negative for cough,  wheezing CARDIOVASCULAR: Negative for chest pain, leg swelling, palpitations, orthopnea GI: SEE HPI MUSCULOSKELETAL: Negative for joint pain or swelling, back pain, and muscle pain. SKIN: Negative for lesions, rash PSYCH: Negative for sleep disturbance, mood disorder and recent psychosocial stressors. HEMATOLOGY Negative for prolonged bleeding, bruising easily, and swollen nodes. ENDOCRINE: Negative for cold or heat intolerance, polyuria, polydipsia and goiter. NEURO: negative for tremor, gait imbalance, syncope and seizures. The remainder of the review of systems is noncontributory.   Physical Exam: BP 105/80 (BP Location: Right Arm, Patient Position: Sitting, Cuff Size: Large)   Pulse 72   Temp 98.1 F (36.7 C) (Oral)   Ht 5' 3" (1.6 m)   Wt (!) 310 lb (140.6 kg)   BMI 54.91 kg/m  GENERAL: The patient is AO x3, in no acute distress. Obese. HEENT: Head is normocephalic and atraumatic. EOMI are intact. Mouth is well hydrated and without lesions. NECK: Supple. No masses LUNGS: Clear to   auscultation. No presence of rhonchi/wheezing/rales. Adequate chest expansion HEART: RRR, normal s1 and s2. ABDOMEN: Soft, nontender, no guarding, no peritoneal signs, and nondistended. BS +. No masses. EXTREMITIES: Without any cyanosis, clubbing, rash, lesions or edema. NEUROLOGIC: AOx3, no focal motor deficit. SKIN: no jaundice, no rashes  Imaging/Labs: as above  I personally reviewed and interpreted the available labs, imaging and endoscopic files.  Impression and Plan: ANAIYAH ANGLEMYER is a 35 y.o. female with past medical history of seronegative PBC, Hashimoto thyroiditis, obesity, migraines and depression, who presents for follow up of PBC and evaluation of chronic diarrhea.  The patient was started on ursodiol therapeutic dose and recent appointment and has tolerated the medication adequately without any side effects.  We will continue with the current dose but will check today her CMP and  assess response to medication.  On the right hand, the patient is presenting chronic episodes of diarrhea.  Given her history of autoimmune diseases, it would be important to rule out other coexisting autoimmune disorders such as celiac disease or microscopic colitis, for which I will check celiac serologies and schedule a colonoscopy to explore this further.  It is also possible that her symptoms are related to IBS-D as her abdominal pain improved with bowel movements, for which I would provide a dietary list for low FODMAP diet.  She can continue taking Imodium as needed to improve her diarrhea.  - Continue ursodiol 900 mg BID - Continue omeprazole 40 mg qday - Patient was counseled about the benefit of implementing a low FODMAP to improve symptoms and recurrent episodes. A dietary list was provided to the patient.  - Check CBC, CMP and celiac panel - Schedule colonoscopy with random colonic biopsies - Can take Imodium as needed for diarrhea - Repeat DEXA scan 2 years after last test  All questions were answered.      Dolores Frame, MD Gastroenterology and Hepatology [facility]

## 2020-04-27 NOTE — Progress Notes (Signed)
[name], M.D. Gastroenterology & Hepatology [facility] [address] [place], [place] [address]  Primary Care Physician: Richardean Chimera, MD [address] [place] [address]  I will communicate my assessment and recommendations to the referring MD via EMR.  Problems: 1. Seronegative PBC 2. Chronic diarrhea 3. Osteopenia  History of Present Illness: [name] is a 35 y.o. female with past medical history of seronegative PBC, Hashimoto thyroiditis, obesity, migraines and depression, who presents for follow up of PBC and evaluation of chronic diarrhea.  The patient was last seen on 01/24/2020. At that time, the patient had her ursodiol increased to 900 mg BID (therapeutic dose) was started on omeprazole 40 mg for abdominal pain and has her baseline blood tests checked including lipid panel.  Also had a DEXA scan which showed osteopenia - currently on Vitamin D and calcium supplementation.  Patient reports that for the last year she has presented at least 5-8 bowel movements per day, usually watery to soft but occasionally is formed. She denies having any melena or hematochezia, but has noted the stool "sticks to the toilet but does not float". She states that she has also presented some upper abdominal pain before having a BM, and it resolves after she defecates. She is not following any type of diet. She feels bloating intermittently. Believes Prilosec has helped her improve her symptoms but not resolved them.  Notably, she had biopsy-proven PBC with questionable PSC changes.  She had positive ASMA but her IgG levels were normal.  Patient tolerating her medication adequately, taking it compliantly.  The patient denies having any nausea, vomiting, fever, chills, hematochezia, melena, hematemesis, abdominal distention, jaundice, pruritus or weight loss.  Last EGD: never  Last Colonoscopy: 2020  Past Medical History: Past  Medical History:  Diagnosis Date  . Breast nodule 10/10/2014  . Breast pain 10/10/2014  . BV (bacterial vaginosis) 11/12/2012  . Depression   . Fatty liver   . Hypothyroid 02/14/2015  . Irregular menses 05/21/2013  . Migraines   . Obesity   . Other and unspecified ovarian cyst 05/31/2013   Right complex ?cystic vs solid mass on ovary will schedule appt with JVF  . Pregnant 02/14/2015  . Thyroid disease    Reported Hashimoto's Thyroiditis in past    Past Surgical History: Past Surgical History:  Procedure Laterality Date  . BIOPSY  04/17/2018   Procedure: BIOPSY;  Surgeon: Malissa Hippo, MD;  [facility];  Service: Endoscopy;;  colon  . CESAREAN SECTION    . COLONOSCOPY    . COLONOSCOPY WITH PROPOFOL N/A 04/17/2018   Procedure: COLONOSCOPY WITH PROPOFOL;  Surgeon: Malissa Hippo, MD;  [facility];  Service: Endoscopy;  Laterality: N/A;  10:50  . LIVER BIOPSY N/A 08/17/2018   Procedure: LIVER BIOPSY;  Surgeon: Lucretia Roers, MD;  Location: [facility];  Service: General;  Laterality: N/A;  . OOPHORECTOMY Right   . TONSILLECTOMY    . TUBAL LIGATION Bilateral 08/17/2018   Procedure: LAPAROSCOPIC BILATERAL TUBAL LIGATION WITH FALLOPE RINGS;  Surgeon: Tilda Burrow, MD;  Location: [facility];  Service: Gynecology;  Laterality: Bilateral;  . VENTRAL HERNIA REPAIR N/A 08/17/2018   Procedure: LAPAROSCOPIC VENTRAL HERNIA WITH MESH;  Surgeon: Lucretia Roers, MD;  Location: [facility];  Service: General;  Laterality: N/A;  . WISDOM TOOTH EXTRACTION      Family History: Family History  Problem Relation Age of Onset  .  Diabetes Maternal Grandmother   . Hypertension Maternal Grandmother   . Cancer Maternal Grandmother        breast  . Diabetes Maternal Grandfather   . Hypertension Maternal Grandfather   . Leukemia Maternal Grandfather   . Diabetes Mother   . Hypertension Mother   . Heart attack Mother   . Stroke Mother   . Kidney disease Paternal Grandfather   .  Seizures Father   . Other Father        back problems  . Dementia Paternal Grandmother   . Cancer Maternal Aunt   . Diabetes Maternal Uncle   . Colon cancer Cousin   . Stomach cancer Other   . Ovarian cancer Cousin   . Ulcerative colitis Neg Hx   . Crohn's disease Neg Hx     Social History: Social History   Tobacco Use  Smoking Status Former Smoker  . Packs/day: 0.25  . Years: 8.00  . Pack years: 2.00  . Types: Cigarettes  . Quit date: 10/07/2010  . Years since quitting: 9.5  Smokeless Tobacco Never Used   Social History   Substance and Sexual Activity  Alcohol Use No   Comment: rare   Social History   Substance and Sexual Activity  Drug Use No    Allergies: Allergies  Allergen Reactions  . Imitrex [Sumatriptan]     Makes migraines worse.   . Maxalt [Rizatriptan]     Makes migraines worse  . Phenergan [Promethazine Hcl] Nausea And Vomiting    Pill form; IV ok  . Tape Other (See Comments)    Blisters on abdomen after C Section    Medications: Current Outpatient Medications  Medication Sig Dispense Refill  . calcium-vitamin D (OSCAL WITH D) 500-200 MG-UNIT tablet Take 1 tablet by mouth daily with breakfast. 90 tablet 1  . levothyroxine (SYNTHROID, LEVOTHROID) 150 MCG tablet Take 1 tablet (150 mcg total) by mouth daily before breakfast. (Patient taking differently: Take 100 mcg by mouth daily before breakfast.) 30 tablet 3  . omeprazole (PRILOSEC) 40 MG capsule Take 1 capsule (40 mg total) by mouth daily. 90 capsule 3  . ursodiol (ACTIGALL) 300 MG capsule Take 3 capsules (900 mg total) by mouth 2 (two) times daily. 180 capsule 3   No current facility-administered medications for this visit.    Review of Systems: GENERAL: negative for malaise, night sweats HEENT: No changes in hearing or vision, no nose bleeds or other nasal problems. NECK: Negative for lumps, goiter, pain and significant neck swelling RESPIRATORY: Negative for cough,  wheezing CARDIOVASCULAR: Negative for chest pain, leg swelling, palpitations, orthopnea GI: SEE HPI MUSCULOSKELETAL: Negative for joint pain or swelling, back pain, and muscle pain. SKIN: Negative for lesions, rash PSYCH: Negative for sleep disturbance, mood disorder and recent psychosocial stressors. HEMATOLOGY Negative for prolonged bleeding, bruising easily, and swollen nodes. ENDOCRINE: Negative for cold or heat intolerance, polyuria, polydipsia and goiter. NEURO: negative for tremor, gait imbalance, syncope and seizures. The remainder of the review of systems is noncontributory.   Physical Exam: BP 105/80 (BP Location: Right Arm, Patient Position: Sitting, Cuff Size: Large)   Pulse 72   Temp 98.1 F (36.7 C) (Oral)   Ht 5\' 3"  (1.6 m)   Wt (!) 310 lb (140.6 kg)   BMI 54.91 kg/m  GENERAL: The patient is AO x3, in no acute distress. Obese. HEENT: Head is normocephalic and atraumatic. EOMI are intact. Mouth is well hydrated and without lesions. NECK: Supple. No masses LUNGS: Clear to  auscultation. No presence of rhonchi/wheezing/rales. Adequate chest expansion HEART: RRR, normal s1 and s2. ABDOMEN: Soft, nontender, no guarding, no peritoneal signs, and nondistended. BS +. No masses. EXTREMITIES: Without any cyanosis, clubbing, rash, lesions or edema. NEUROLOGIC: AOx3, no focal motor deficit. SKIN: no jaundice, no rashes  Imaging/Labs: as above  I personally reviewed and interpreted the available labs, imaging and endoscopic files.  Impression and Plan: [name] is a 35 y.o. female with past medical history of seronegative PBC, Hashimoto thyroiditis, obesity, migraines and depression, who presents for follow up of PBC and evaluation of chronic diarrhea.  The patient was started on ursodiol therapeutic dose and recent appointment and has tolerated the medication adequately without any side effects.  We will continue with the current dose but will check today her CMP and  assess response to medication.  On the right hand, the patient is presenting chronic episodes of diarrhea.  Given her history of autoimmune diseases, it would be important to rule out other coexisting autoimmune disorders such as celiac disease or microscopic colitis, for which I will check celiac serologies and schedule a colonoscopy to explore this further.  It is also possible that her symptoms are related to IBS-D as her abdominal pain improved with bowel movements, for which I would provide a dietary list for low FODMAP diet.  She can continue taking Imodium as needed to improve her diarrhea.  - Continue ursodiol 900 mg BID - Continue omeprazole 40 mg qday - Patient was counseled about the benefit of implementing a low FODMAP to improve symptoms and recurrent episodes. A dietary list was provided to the patient.  - Check CBC, CMP and celiac panel - Schedule colonoscopy with random colonic biopsies - Can take Imodium as needed for diarrhea - Repeat DEXA scan 2 years after last test  All questions were answered.      Dolores Frame, MD Gastroenterology and Hepatology [facility]

## 2020-04-27 NOTE — Patient Instructions (Addendum)
Continue ursodiol 900 mg BID Continue omeprazole 40 mg qday Patient was counseled about the benefit of implementing a low FODMAP to improve symptoms and recurrent episodes. A dietary list was provided to the patient.  Perform blood workup Schedule colonoscopy Can take Imodium as needed for diarrhea

## 2020-04-27 NOTE — Telephone Encounter (Signed)
LeighAnn Lovelace, CMA  

## 2020-04-28 LAB — COMPREHENSIVE METABOLIC PANEL
AG Ratio: 1.2 (calc) (ref 1.0–2.5)
ALT: 46 U/L — ABNORMAL HIGH (ref 6–29)
AST: 54 U/L — ABNORMAL HIGH (ref 10–30)
Albumin: 3.8 g/dL (ref 3.6–5.1)
Alkaline phosphatase (APISO): 353 U/L — ABNORMAL HIGH (ref 31–125)
BUN: 8 mg/dL (ref 7–25)
CO2: 26 mmol/L (ref 20–32)
Calcium: 8.9 mg/dL (ref 8.6–10.2)
Chloride: 107 mmol/L (ref 98–110)
Creat: 0.53 mg/dL (ref 0.50–1.10)
Globulin: 3.2 g/dL (calc) (ref 1.9–3.7)
Glucose, Bld: 86 mg/dL (ref 65–139)
Potassium: 4.1 mmol/L (ref 3.5–5.3)
Sodium: 140 mmol/L (ref 135–146)
Total Bilirubin: 0.6 mg/dL (ref 0.2–1.2)
Total Protein: 7 g/dL (ref 6.1–8.1)

## 2020-04-28 LAB — CBC WITH DIFFERENTIAL/PLATELET
Absolute Monocytes: 299 cells/uL (ref 200–950)
Basophils Absolute: 131 cells/uL (ref 0–200)
Basophils Relative: 1.8 %
Eosinophils Absolute: 482 cells/uL (ref 15–500)
Eosinophils Relative: 6.6 %
HCT: 39.3 % (ref 35.0–45.0)
Hemoglobin: 13.3 g/dL (ref 11.7–15.5)
Lymphs Abs: 3030 cells/uL (ref 850–3900)
MCH: 29.2 pg (ref 27.0–33.0)
MCHC: 33.8 g/dL (ref 32.0–36.0)
MCV: 86.2 fL (ref 80.0–100.0)
MPV: 9.6 fL (ref 7.5–12.5)
Monocytes Relative: 4.1 %
Neutro Abs: 3358 cells/uL (ref 1500–7800)
Neutrophils Relative %: 46 %
Platelets: 397 10*3/uL (ref 140–400)
RBC: 4.56 10*6/uL (ref 3.80–5.10)
RDW: 13.3 % (ref 11.0–15.0)
Total Lymphocyte: 41.5 %
WBC: 7.3 10*3/uL (ref 3.8–10.8)

## 2020-04-28 LAB — CELIAC DISEASE PANEL
(tTG) Ab, IgA: <1 U/mL
(tTG) Ab, IgG: <1 U/mL
Gliadin IgA: <1 U/mL
Gliadin IgG: <1 U/mL
Immunoglobulin A: 99 mg/dL (ref 47–310)

## 2020-05-02 NOTE — Patient Instructions (Signed)
[name]  05/02/2020     @PREFPERIOPPHARMACY @   Your procedure is scheduled on  05/05/2020   Report to [facility] at  0800  A.M.   Call this number if you have problems the morning of surgery:  [phone]   Remember:  Follow the diet and prep instructions given to you by the office.                      Take these medicines the morning of surgery with A SIP OF WATER  Levothyroxine, omeprazole, ursodiol.    Please brush your teeth.  Do not wear jewelry, make-up or nail polish.  Do not wear lotions, powders, or perfumes, or deodorant.  Do not shave 48 hours prior to surgery.  Men may shave face and neck.  Do not bring valuables to the hospital.  [facility] is not responsible for any belongings or valuables.  Contacts, dentures or bridgework may not be worn into surgery.  Leave your suitcase in the car.  After surgery it may be brought to your room.  For patients admitted to the hospital, discharge time will be determined by your treatment team.  Patients discharged the day of surgery will not be allowed to drive home and must have someone with them for 24 hours.   Special instructions:  DO NOT smoke tobacco or vape the morning of your procedure.  Please read over the following fact sheets that you were given. Anesthesia Post-op Instructions and Care and Recovery After Surgery       Colonoscopy, Adult, Care After This sheet gives you information about how to care for yourself after your procedure. Your health care provider may also give you more specific instructions. If you have problems or questions, contact your health care provider. What can I expect after the procedure? After the procedure, it is common to have:  A small amount of blood in your stool for 24 hours after the procedure.  Some gas.  Mild cramping or bloating of your abdomen. Follow these instructions at home: Eating and drinking  Drink enough fluid to keep your urine pale  yellow.  Follow instructions from your health care provider about eating or drinking restrictions.  Resume your normal diet as instructed by your health care provider. Avoid heavy or fried foods that are hard to digest.   Activity  Rest as told by your health care provider.  Avoid sitting for a long time without moving. Get up to take short walks every 1-2 hours. This is important to improve blood flow and breathing. Ask for help if you feel weak or unsteady.  Return to your normal activities as told by your health care provider. Ask your health care provider what activities are safe for you. Managing cramping and bloating  Try walking around when you have cramps or feel bloated.  Apply heat to your abdomen as told by your health care provider. Use the heat source that your health care provider recommends, such as a moist heat pack or a heating pad. ? Place a towel between your skin and the heat source. ? Leave the heat on for 20-30 minutes. ? Remove the heat if your skin turns bright red. This is especially important if you are unable to feel pain, heat, or cold. You may have a greater risk of getting burned.   General instructions  If you were given a sedative during the procedure, it can affect you  for several hours. Do not drive or operate machinery until your health care provider says that it is safe.  For the first 24 hours after the procedure: ? Do not sign important documents. ? Do not drink alcohol. ? Do your regular daily activities at a slower pace than normal. ? Eat soft foods that are easy to digest.  Take over-the-counter and prescription medicines only as told by your health care provider.  Keep all follow-up visits as told by your health care provider. This is important. Contact a health care provider if:  You have blood in your stool 2-3 days after the procedure. Get help right away if you have:  More than a small spotting of blood in your stool.  Large blood  clots in your stool.  Swelling of your abdomen.  Nausea or vomiting.  A fever.  Increasing pain in your abdomen that is not relieved with medicine. Summary  After the procedure, it is common to have a small amount of blood in your stool. You may also have mild cramping and bloating of your abdomen.  If you were given a sedative during the procedure, it can affect you for several hours. Do not drive or operate machinery until your health care provider says that it is safe.  Get help right away if you have a lot of blood in your stool, nausea or vomiting, a fever, or increased pain in your abdomen. This information is not intended to replace advice given to you by your health care provider. Make sure you discuss any questions you have with your health care provider. Document Revised: 01/22/2019 Document Reviewed: 08/24/2018 Elsevier Patient Education  2021 [employer] After This sheet gives you information about how to care for yourself after your procedure. Your health care provider may also give you more specific instructions. If you have problems or questions, contact your health care provider. What can I expect after the procedure? After the procedure, it is common to have:  Tiredness.  Forgetfulness about what happened after the procedure.  Impaired judgment for important decisions.  Nausea or vomiting.  Some difficulty with balance. Follow these instructions at home: For the time period you were told by your health care provider:  Rest as needed.  Do not participate in activities where you could fall or become injured.  Do not drive or use machinery.  Do not drink alcohol.  Do not take sleeping pills or medicines that cause drowsiness.  Do not make important decisions or sign legal documents.  Do not take care of children on your own.      Eating and drinking  Follow the diet that is recommended by your health care  provider.  Drink enough fluid to keep your urine pale yellow.  If you vomit: ? Drink water, juice, or soup when you can drink without vomiting. ? Make sure you have little or no nausea before eating solid foods. General instructions  Have a responsible adult stay with you for the time you are told. It is important to have someone help care for you until you are awake and alert.  Take over-the-counter and prescription medicines only as told by your health care provider.  If you have sleep apnea, surgery and certain medicines can increase your risk for breathing problems. Follow instructions from your health care provider about wearing your sleep device: ? Anytime you are sleeping, including during daytime naps. ? While taking prescription pain medicines, sleeping medicines, or medicines that  make you drowsy.  Avoid smoking.  Keep all follow-up visits as told by your health care provider. This is important. Contact a health care provider if:  You keep feeling nauseous or you keep vomiting.  You feel light-headed.  You are still sleepy or having trouble with balance after 24 hours.  You develop a rash.  You have a fever.  You have redness or swelling around the IV site. Get help right away if:  You have trouble breathing.  You have new-onset confusion at home. Summary  For several hours after your procedure, you may feel tired. You may also be forgetful and have poor judgment.  Have a responsible adult stay with you for the time you are told. It is important to have someone help care for you until you are awake and alert.  Rest as told. Do not drive or operate machinery. Do not drink alcohol or take sleeping pills.  Get help right away if you have trouble breathing, or if you suddenly become confused. This information is not intended to replace advice given to you by your health care provider. Make sure you discuss any questions you have with your health care  provider. Document Revised: 10/14/2019 Document Reviewed: 12/31/2018 Elsevier Patient Education  2021 [employer].

## 2020-05-03 ENCOUNTER — Other Ambulatory Visit (HOSPITAL_COMMUNITY)
Admission: RE | Admit: 2020-05-03 | Discharge: 2020-05-03 | Disposition: A | Discharge status: Home / Self Care | Payer: Medicaid Other | Source: Ambulatory Visit | Attending: Gastroenterology | Admitting: Gastroenterology

## 2020-05-03 ENCOUNTER — Other Ambulatory Visit: Payer: Self-pay

## 2020-05-03 ENCOUNTER — Encounter (HOSPITAL_COMMUNITY): Payer: Self-pay

## 2020-05-03 ENCOUNTER — Encounter (HOSPITAL_COMMUNITY)
Admission: RE | Admit: 2020-05-03 | Discharge: 2020-05-03 | Disposition: A | Discharge status: Home / Self Care | Payer: Self-pay | Source: Ambulatory Visit | Attending: Gastroenterology | Admitting: Gastroenterology

## 2020-05-03 DIAGNOSIS — Z01812 Encounter for preprocedural laboratory examination: Secondary | ICD-10-CM | POA: Insufficient documentation

## 2020-05-03 DIAGNOSIS — Z20822 Contact with and (suspected) exposure to covid-19: Secondary | ICD-10-CM | POA: Insufficient documentation

## 2020-05-03 LAB — PREGNANCY, URINE: Preg Test, Ur: NEGATIVE

## 2020-05-04 LAB — SARS CORONAVIRUS 2 (TAT 6-24 HRS): SARS Coronavirus 2: NEGATIVE

## 2020-05-05 ENCOUNTER — Ambulatory Visit (HOSPITAL_COMMUNITY): Payer: Self-pay | Admitting: Certified Registered Nurse Anesthetist

## 2020-05-05 ENCOUNTER — Ambulatory Visit (HOSPITAL_COMMUNITY)
Admission: RE | Admit: 2020-05-05 | Discharge: 2020-05-05 | Disposition: A | Discharge status: Home / Self Care | Payer: Self-pay | Attending: Gastroenterology | Admitting: Gastroenterology

## 2020-05-05 ENCOUNTER — Encounter (HOSPITAL_COMMUNITY)
Admission: RE | Disposition: A | Discharge status: Home / Self Care | Payer: Self-pay | Source: Home / Self Care | Attending: Gastroenterology

## 2020-05-05 ENCOUNTER — Encounter (HOSPITAL_COMMUNITY): Payer: Self-pay | Admitting: Gastroenterology

## 2020-05-05 DIAGNOSIS — E063 Autoimmune thyroiditis: Secondary | ICD-10-CM | POA: Insufficient documentation

## 2020-05-05 DIAGNOSIS — Z6841 Body Mass Index (BMI) 40.0 and over, adult: Secondary | ICD-10-CM | POA: Insufficient documentation

## 2020-05-05 DIAGNOSIS — Z90721 Acquired absence of ovaries, unilateral: Secondary | ICD-10-CM | POA: Insufficient documentation

## 2020-05-05 DIAGNOSIS — K523 Indeterminate colitis: Secondary | ICD-10-CM | POA: Insufficient documentation

## 2020-05-05 DIAGNOSIS — Z79899 Other long term (current) drug therapy: Secondary | ICD-10-CM | POA: Insufficient documentation

## 2020-05-05 DIAGNOSIS — K529 Noninfective gastroenteritis and colitis, unspecified: Secondary | ICD-10-CM

## 2020-05-05 DIAGNOSIS — Z87891 Personal history of nicotine dependence: Secondary | ICD-10-CM | POA: Insufficient documentation

## 2020-05-05 DIAGNOSIS — R197 Diarrhea, unspecified: Secondary | ICD-10-CM

## 2020-05-05 DIAGNOSIS — Z888 Allergy status to other drugs, medicaments and biological substances status: Secondary | ICD-10-CM | POA: Insufficient documentation

## 2020-05-05 DIAGNOSIS — Z79890 Hormone replacement therapy (postmenopausal): Secondary | ICD-10-CM | POA: Insufficient documentation

## 2020-05-05 DIAGNOSIS — E669 Obesity, unspecified: Secondary | ICD-10-CM | POA: Insufficient documentation

## 2020-05-05 DIAGNOSIS — M8580 Other specified disorders of bone density and structure, unspecified site: Secondary | ICD-10-CM | POA: Insufficient documentation

## 2020-05-05 DIAGNOSIS — K760 Fatty (change of) liver, not elsewhere classified: Secondary | ICD-10-CM | POA: Insufficient documentation

## 2020-05-05 HISTORY — PX: COLONOSCOPY WITH PROPOFOL: SHX5780

## 2020-05-05 HISTORY — PX: BIOPSY: SHX5522

## 2020-05-05 LAB — HM COLONOSCOPY

## 2020-05-05 LAB — HEPATITIS B CORE ANTIBODY, TOTAL: Hep B Core Total Ab: NONREACTIVE

## 2020-05-05 LAB — HEPATITIS B SURFACE ANTIGEN: Hepatitis B Surface Ag: NONREACTIVE

## 2020-05-05 SURGERY — COLONOSCOPY WITH PROPOFOL
Anesthesia: General

## 2020-05-05 MED ORDER — GLYCOPYRROLATE 0.2 MG/ML IJ SOLN
INTRAMUSCULAR | Status: DC | PRN
  Administered 2020-05-05: .1 mg via INTRAVENOUS

## 2020-05-05 MED ORDER — PROPOFOL 500 MG/50ML IV EMUL
INTRAVENOUS | Status: DC | PRN
  Administered 2020-05-05: 150 ug/kg/min via INTRAVENOUS

## 2020-05-05 MED ORDER — KETAMINE HCL 10 MG/ML IJ SOLN
INTRAMUSCULAR | Status: DC | PRN
  Administered 2020-05-05: 10 mg via INTRAVENOUS
  Administered 2020-05-05: 20 mg via INTRAVENOUS

## 2020-05-05 MED ORDER — MESALAMINE 1.2 G PO TBEC: 2.4000 g | DELAYED_RELEASE_TABLET | Freq: Every day | ORAL | 1 refills | Status: DC

## 2020-05-05 MED ORDER — KETAMINE HCL 50 MG/5ML IJ SOSY
PREFILLED_SYRINGE | INTRAMUSCULAR | Status: AC
  Filled 2020-05-05: qty 5

## 2020-05-05 MED ORDER — LIDOCAINE HCL (CARDIAC) PF 100 MG/5ML IV SOSY
PREFILLED_SYRINGE | INTRAVENOUS | Status: DC | PRN
  Administered 2020-05-05: 50 mg via INTRATRACHEAL

## 2020-05-05 MED ORDER — LACTATED RINGERS IV SOLN
INTRAVENOUS | Status: DC
  Administered 2020-05-05: 1000 mL via INTRAVENOUS

## 2020-05-05 MED ORDER — PROPOFOL 10 MG/ML IV BOLUS
INTRAVENOUS | Status: DC | PRN
  Administered 2020-05-05: 30 mg via INTRAVENOUS

## 2020-05-05 MED ORDER — PROPOFOL 10 MG/ML IV BOLUS
INTRAVENOUS | Status: AC
  Filled 2020-05-05: qty 20

## 2020-05-05 NOTE — Anesthesia Postprocedure Evaluation (Signed)
Anesthesia Post Note  Patient: [name]  Procedure(s) Performed: COLONOSCOPY WITH PROPOFOL (N/A ) BIOPSY  Patient location during evaluation: PACU Anesthesia Type: General Level of consciousness: awake Pain management: pain level controlled Vital Signs Assessment: post-procedure vital signs reviewed and stable Respiratory status: spontaneous breathing, nonlabored ventilation and respiratory function stable Cardiovascular status: stable Postop Assessment: no apparent nausea or vomiting Anesthetic complications: no   No complications documented.   Last Vitals:  Vitals:   05/05/20 0823  BP: 113/81  Pulse: 69  Resp: 18  Temp: 36.8 C  SpO2: 98%    Last Pain:  Vitals:   05/05/20 1020  TempSrc:   PainSc: 0-No pain                 Keighan Amezcua Hristova

## 2020-05-05 NOTE — Op Note (Signed)
[facility] Patient Name: [name] Procedure Date: 05/05/2020 10:05 AM MRN: [id number] Date of Birth: [birth year] Attending MD: [name] ,  CSN: [id number] Age: 35 Admit Type: Outpatient Procedure:                Colonoscopy Indications:              Chronic diarrhea, Clinically significant diarrhea                            of unexplained origin Providers:                [name], Brain Hilts, RN, Burke Keels,                            Technician Referring MD:              Medicines:                Monitored Anesthesia Care Complications:            No immediate complications. Estimated Blood Loss:     Estimated blood loss: none. Procedure:                Pre-Anesthesia Assessment:                           - Prior to the procedure, a History and Physical                            was performed, and patient medications, allergies                            and sensitivities were reviewed. The patient's                            tolerance of previous anesthesia was reviewed.                           - The risks and benefits of the procedure and the                            sedation options and risks were discussed with the                            patient. All questions were answered and informed                            consent was obtained.                           - ASA Grade Assessment: II - A patient with mild                            systemic disease.                           After obtaining informed consent, the colonoscope  was passed under direct vision. Throughout the                            procedure, the patient's blood pressure, pulse, and                            oxygen saturations were monitored continuously. The                            [id number]-[id number] ([id number]) was introduced through the                            anus and advanced to the the terminal ileum. The                             colonoscopy was performed without difficulty. The                            patient tolerated the procedure well. Scope In: 10:28:17 AM Scope Out: 10:54:54 AM Scope Withdrawal Time: 0 hours 21 minutes 22 seconds  Total Procedure Duration: 0 hours 26 minutes 37 seconds  Findings:      The perianal and digital rectal examinations were normal.      The terminal ileum appeared normal. Biopsies were taken with a cold       forceps for histology.      Diffuse mild inflammation characterized by altered vascularity,       congestion (edema), erythema and friability was found in the descending       colon, in the transverse colon, in the ascending colon and in the cecum.       Notably, the sigmoid colon and rectum were spared. The inflammation was       worse in the right side of the colon. Cecum, ascending, transverse,       descending, simgoid colon and rectum were biopsied with a cold forceps       for histology.      The retroflexed view of the distal rectum and anal verge was normal and       showed no anal or rectal abnormalities. Impression:               - The examined portion of the ileum was normal.                            Biopsied.                           - Diffuse mild inflammation was found in the                            descending colon, in the transverse colon, in the                            ascending colon and in the cecum secondary to                            inflammatory bowel disease. Biopsied.                           -  The distal rectum and anal verge are normal on                            retroflexion view. Moderate Sedation:      Per Anesthesia Care Recommendation:           - Discharge patient to home (ambulatory).                           - Resume previous diet.                           - Continue present medications, including Imodium                            as needed for now.                           - Start Lialda 2.4 g qday.                            - Await pathology results.                           - Repeat colonoscopy for surveillance based on                            pathology results. Procedure Code(s):        --- Professional ---                           [phone], Colonoscopy, flexible; with biopsy, single                            or multiple Diagnosis Code(s):        --- Professional ---                           K52.3, Indeterminate colitis                           K52.9, Noninfective gastroenteritis and colitis,                            unspecified                           R19.7, Diarrhea, unspecified CPT copyright 2019 American Medical Association. All rights reserved. The codes documented in this report are preliminary and upon coder review may  be revised to meet current compliance requirements. [name], MD [name],  05/05/2020 11:03:31 AM This report has been signed electronically. Number of Addenda: 0

## 2020-05-05 NOTE — Transfer of Care (Signed)
Immediate Anesthesia Transfer of Care Note  Patient: [name]  Procedure(s) Performed: COLONOSCOPY WITH PROPOFOL (N/A ) BIOPSY  Patient Location: PACU  Anesthesia Type:General  Level of Consciousness: awake  Airway & Oxygen Therapy: Patient Spontanous Breathing  Post-op Assessment: Report given to RN and Post -op Vital signs reviewed and stable  Post vital signs: Reviewed and stable  Last Vitals:  Vitals Value Taken Time  BP    Temp    Pulse    Resp    SpO2      Last Pain:  Vitals:   05/05/20 1020  TempSrc:   PainSc: 0-No pain      Patients Stated Pain Goal: 7 (05/05/20 0823)  Complications: No complications documented.

## 2020-05-05 NOTE — Anesthesia Preprocedure Evaluation (Signed)
Anesthesia Evaluation  Patient identified by MRN, date of birth, ID band Patient awake    Reviewed: Allergy & Precautions, H&P , NPO status , Patient's Chart, lab work & pertinent test results, reviewed documented beta blocker date and time   Airway Mallampati: II  TM Distance: >3 FB Neck ROM: full    Dental no notable dental hx.    Pulmonary neg pulmonary ROS, former smoker,    Pulmonary exam normal breath sounds clear to auscultation       Cardiovascular Exercise Tolerance: Good negative cardio ROS   Rhythm:regular Rate:Normal     Neuro/Psych  Headaches, PSYCHIATRIC DISORDERS Anxiety Depression    GI/Hepatic negative GI ROS, (+) Hepatitis - (PBC)  Endo/Other  Hypothyroidism Morbid obesity  Renal/GU negative Renal ROS  negative genitourinary   Musculoskeletal   Abdominal   Peds  Hematology negative hematology ROS (+)   Anesthesia Other Findings   Reproductive/Obstetrics negative OB ROS                             Anesthesia Physical Anesthesia Plan  ASA: III  Anesthesia Plan: General   Post-op Pain Management:    Induction:   PONV Risk Score and Plan: Propofol infusion  Airway Management Planned:   Additional Equipment:   Intra-op Plan:   Post-operative Plan:   Informed Consent: I have reviewed the patients History and Physical, chart, labs and discussed the procedure including the risks, benefits and alternatives for the proposed anesthesia with the patient or authorized representative who has indicated his/her understanding and acceptance.     Dental Advisory Given  Plan Discussed with: CRNA  Anesthesia Plan Comments:         Anesthesia Quick Evaluation

## 2020-05-05 NOTE — Interval H&P Note (Signed)
History and Physical Interval Note:  05/05/2020 8:34 AM [name] is a 35 y.o. female with past medical history of seronegative PBC, Hashimoto thyroiditis, obesity, migraines and depression, who presents for evaluation of chronic diarrhea.  Patient reports that she has had recurrent episodes of watery bowel movements up to 8 times per day for the last year.  Denies any nausea, vomiting, fever, chills, abdominal pain, melena or hematochezia.  She has intermittent bloating.  Has recent testing for celiac disease, as well as CBC and CMP which were unremarkable.  Last colonoscopy was performed in 2020 which was within normal limits but no random colon biopsies were performed.  BP 113/81   Pulse 69   Temp 98.2 F (36.8 C) (Oral)   Resp 18   LMP 04/26/2020   SpO2 98%  GENERAL: The patient is AO x3, in no acute distress. HEENT: Head is normocephalic and atraumatic. EOMI are intact. Mouth is well hydrated and without lesions. NECK: Supple. No masses LUNGS: Clear to auscultation. No presence of rhonchi/wheezing/rales. Adequate chest expansion HEART: RRR, normal s1 and s2. ABDOMEN: Soft, nontender, no guarding, no peritoneal signs, and nondistended. BS +. No masses. EXTREMITIES: Without any cyanosis, clubbing, rash, lesions or edema. NEUROLOGIC: AOx3, no focal motor deficit. SKIN: no jaundice, no rashes  [name]  has presented today for surgery, with the diagnosis of Chronic Diarrhea.  The various methods of treatment have been discussed with the patient and family. After consideration of risks, benefits and other options for treatment, the patient has consented to  Procedure(s) with comments: COLONOSCOPY WITH PROPOFOL (N/A) - AM as a surgical intervention.  The patient's history has been reviewed, patient examined, no change in status, stable for surgery.  I have reviewed the patient's chart and labs.  Questions were answered to the patient's satisfaction.     [name]  [place]

## 2020-05-05 NOTE — Discharge Instructions (Signed)
You are being discharged to home.  Resume your previous diet.  Continue your present medications, including Imodium as needed for now.  Start Lialda 2.4 g qday. We are waiting for your pathology results.  Your physician has recommended a repeat colonoscopy for surveillance based on pathology results.   Monitored Anesthesia Care, Care After This sheet gives you information about how to care for yourself after your procedure. Your health care provider may also give you more specific instructions. If you have problems or questions, contact your health care provider. What can I expect after the procedure? After the procedure, it is common to have:  Tiredness.  Forgetfulness about what happened after the procedure.  Impaired judgment for important decisions.  Nausea or vomiting.  Some difficulty with balance. Follow these instructions at home: For the time period you were told by your health care provider:  Rest as needed.  Do not participate in activities where you could fall or become injured.  Do not drive or use machinery.  Do not drink alcohol.  Do not take sleeping pills or medicines that cause drowsiness.  Do not make important decisions or sign legal documents.  Do not take care of children on your own.      Eating and drinking  Follow the diet that is recommended by your health care provider.  Drink enough fluid to keep your urine pale yellow.  If you vomit: ? Drink water, juice, or soup when you can drink without vomiting. ? Make sure you have little or no nausea before eating solid foods. General instructions  Have a responsible adult stay with you for the time you are told. It is important to have someone help care for you until you are awake and alert.  Take over-the-counter and prescription medicines only as told by your health care provider.  If you have sleep apnea, surgery and certain medicines can increase your risk for breathing problems. Follow  instructions from your health care provider about wearing your sleep device: ? Anytime you are sleeping, including during daytime naps. ? While taking prescription pain medicines, sleeping medicines, or medicines that make you drowsy.  Avoid smoking.  Keep all follow-up visits as told by your health care provider. This is important. Contact a health care provider if:  You keep feeling nauseous or you keep vomiting.  You feel light-headed.  You are still sleepy or having trouble with balance after 24 hours.  You develop a rash.  You have a fever.  You have redness or swelling around the IV site. Get help right away if:  You have trouble breathing.  You have new-onset confusion at home. Summary  For several hours after your procedure, you may feel tired. You may also be forgetful and have poor judgment.  Have a responsible adult stay with you for the time you are told. It is important to have someone help care for you until you are awake and alert.  Rest as told. Do not drive or operate machinery. Do not drink alcohol or take sleeping pills.  Get help right away if you have trouble breathing, or if you suddenly become confused. This information is not intended to replace advice given to you by your health care provider. Make sure you discuss any questions you have with your health care provider. Document Revised: 10/14/2019 Document Reviewed: 12/31/2018 Elsevier Patient Education  2021 [employer]        Colonoscopy, Adult, Care After This sheet gives you information about how to  care for yourself after your procedure. Your doctor may also give you more specific instructions. If you have problems or questions, call your doctor. What can I expect after the procedure? After the procedure, it is common to have:  A small amount of blood in your poop (stool) for 24 hours.  Some gas.  Mild cramping or bloating in your belly (abdomen). Follow these instructions at  home: Eating and drinking  Drink enough fluid to keep your pee (urine) pale yellow.  Follow instructions from your doctor about what you cannot eat or drink.  Return to your normal diet as told by your doctor. Avoid heavy or fried foods that are hard to digest.   Activity  Rest as told by your doctor.  Do not sit for a long time without moving. Get up to take short walks every 1-2 hours. This is important. Ask for help if you feel weak or unsteady.  Return to your normal activities as told by your doctor. Ask your doctor what activities are safe for you. To help cramping and bloating:  Try walking around.  Put heat on your belly as told by your doctor. Use the heat source that your doctor recommends, such as a moist heat pack or a heating pad. ? Put a towel between your skin and the heat source. ? Leave the heat on for 20-30 minutes. ? Remove the heat if your skin turns bright red. This is very important if you are unable to feel pain, heat, or cold. You may have a greater risk of getting burned.   General instructions  If you were given a medicine to help you relax (sedative) during your procedure, it can affect you for many hours. Do not drive or use machinery until your doctor says that it is safe.  For the first 24 hours after the procedure: ? Do not sign important documents. ? Do not drink alcohol. ? Do your daily activities more slowly than normal. ? Eat foods that are soft and easy to digest.  Take over-the-counter or prescription medicines only as told by your doctor.  Keep all follow-up visits as told by your doctor. This is important. Contact a doctor if:  You have blood in your poop 2-3 days after the procedure. Get help right away if:  You have more than a small amount of blood in your poop.  You see large clumps of tissue (blood clots) in your poop.  Your belly is swollen.  You feel like you may vomit (nauseous).  You vomit.  You have a fever.  You  have belly pain that gets worse, and medicine does not help your pain. Summary  After the procedure, it is common to have a small amount of blood in your poop. You may also have mild cramping and bloating in your belly.  If you were given a medicine to help you relax (sedative) during your procedure, it can affect you for many hours. Do not drive or use machinery until your doctor says that it is safe.  Get help right away if you have a lot of blood in your poop, feel like you may vomit, have a fever, or have more belly pain. This information is not intended to replace advice given to you by your health care provider. Make sure you discuss any questions you have with your health care provider. Document Revised: 12/04/2018 Document Reviewed: 08/24/2018 Elsevier Patient Education  2021 [employer] Inc.      Monitored Anesthesia Care, Care  After This sheet gives you information about how to care for yourself after your procedure. Your health care provider may also give you more specific instructions. If you have problems or questions, contact your health care provider. What can I expect after the procedure? After the procedure, it is common to have:  Tiredness.  Forgetfulness about what happened after the procedure.  Impaired judgment for important decisions.  Nausea or vomiting.  Some difficulty with balance. Follow these instructions at home: For the time period you were told by your health care provider:  Rest as needed.  Do not participate in activities where you could fall or become injured.  Do not drive or use machinery.  Do not drink alcohol.  Do not take sleeping pills or medicines that cause drowsiness.  Do not make important decisions or sign legal documents.  Do not take care of children on your own.      Eating and drinking  Follow the diet that is recommended by your health care provider.  Drink enough fluid to keep your urine pale yellow.  If you  vomit: ? Drink water, juice, or soup when you can drink without vomiting. ? Make sure you have little or no nausea before eating solid foods. General instructions  Have a responsible adult stay with you for the time you are told. It is important to have someone help care for you until you are awake and alert.  Take over-the-counter and prescription medicines only as told by your health care provider.  If you have sleep apnea, surgery and certain medicines can increase your risk for breathing problems. Follow instructions from your health care provider about wearing your sleep device: ? Anytime you are sleeping, including during daytime naps. ? While taking prescription pain medicines, sleeping medicines, or medicines that make you drowsy.  Avoid smoking.  Keep all follow-up visits as told by your health care provider. This is important. Contact a health care provider if:  You keep feeling nauseous or you keep vomiting.  You feel light-headed.  You are still sleepy or having trouble with balance after 24 hours.  You develop a rash.  You have a fever.  You have redness or swelling around the IV site. Get help right away if:  You have trouble breathing.  You have new-onset confusion at home. Summary  For several hours after your procedure, you may feel tired. You may also be forgetful and have poor judgment.  Have a responsible adult stay with you for the time you are told. It is important to have someone help care for you until you are awake and alert.  Rest as told. Do not drive or operate machinery. Do not drink alcohol or take sleeping pills.  Get help right away if you have trouble breathing, or if you suddenly become confused. This information is not intended to replace advice given to you by your health care provider. Make sure you discuss any questions you have with your health care provider. Document Revised: 10/14/2019 Document Reviewed: 12/31/2018 Elsevier Patient  Education  2021 [employer].

## 2020-05-07 LAB — QUANTIFERON-TB GOLD PLUS (RQFGPL)
QuantiFERON Mitogen Value: >10 IU/mL
QuantiFERON Nil Value: 0.03 IU/mL
QuantiFERON TB1 Ag Value: 0.02 IU/mL
QuantiFERON TB2 Ag Value: 0.03 IU/mL

## 2020-05-07 LAB — QUANTIFERON-TB GOLD PLUS: QuantiFERON-TB Gold Plus: NEGATIVE

## 2020-05-08 LAB — SURGICAL PATHOLOGY

## 2020-05-09 ENCOUNTER — Encounter (INDEPENDENT_AMBULATORY_CARE_PROVIDER_SITE_OTHER): Payer: Self-pay | Admitting: *Deleted

## 2020-05-12 ENCOUNTER — Encounter (HOSPITAL_COMMUNITY): Payer: Self-pay | Admitting: Gastroenterology

## 2020-05-12 LAB — THIOPURINE METHYLTRANSFERASE (TPMT), RBC: TPMT Activity:: 9.9 Units/mL RBC

## 2020-06-28 ENCOUNTER — Ambulatory Visit (INDEPENDENT_AMBULATORY_CARE_PROVIDER_SITE_OTHER): Payer: Medicaid Other | Admitting: Gastroenterology

## 2020-07-24 ENCOUNTER — Other Ambulatory Visit (INDEPENDENT_AMBULATORY_CARE_PROVIDER_SITE_OTHER): Payer: Self-pay | Admitting: Gastroenterology

## 2020-07-24 ENCOUNTER — Telehealth (INDEPENDENT_AMBULATORY_CARE_PROVIDER_SITE_OTHER): Payer: Self-pay | Admitting: Gastroenterology

## 2020-07-24 DIAGNOSIS — R197 Diarrhea, unspecified: Secondary | ICD-10-CM

## 2020-07-24 DIAGNOSIS — R1084 Generalized abdominal pain: Secondary | ICD-10-CM

## 2020-07-24 MED ORDER — DICYCLOMINE HCL 10 MG PO CAPS: 10.0000 mg | ORAL_CAPSULE | Freq: Two times a day (BID) | ORAL | 1 refills | Status: AC | PRN

## 2020-07-24 NOTE — Telephone Encounter (Signed)
Dr Levon Hedger please advise?

## 2020-07-24 NOTE — Telephone Encounter (Signed)
Spoke with the patient today, reported that she was having persistent episodes of diarrhea along with abdominal cramping.  I explained to her that this was likely related to her recent diagnosis of ulcerative colitis, but the colonoscopy did not have anything to do with this.  I advised her to increase the dosage of the although to 4.8 g every day, she has an appointment to see me in clinic next week.  I will prescribe some Bentyl to decrease her abdominal pain but also she will need to collect stool samples to rule out C. difficile and GI bacteria.

## 2020-07-24 NOTE — Telephone Encounter (Signed)
Patient called the office stated since she had her colonoscopy she has had cramping,abd pain, diarrhea - please advise - ph# [phone]

## 2020-07-31 ENCOUNTER — Ambulatory Visit (INDEPENDENT_AMBULATORY_CARE_PROVIDER_SITE_OTHER): Payer: Medicaid Other | Admitting: Gastroenterology

## 2020-08-09 ENCOUNTER — Ambulatory Visit (INDEPENDENT_AMBULATORY_CARE_PROVIDER_SITE_OTHER): Payer: Medicaid Other | Admitting: Gastroenterology

## 2022-03-21 ENCOUNTER — Telehealth: Payer: Self-pay | Admitting: Gastroenterology

## 2022-03-21 NOTE — Telephone Encounter (Signed)
Okay to book her an office visit with [place].  Thanks

## 2022-03-21 NOTE — Telephone Encounter (Signed)
Good Afternoon Dr. Havery Moros,   Supervising Provider 2/8 PM  I have a referral for this patient for elevated LFTs sent by Denny Levy, PA. Patient was previously seen with Dr. Hall Busing but no longer wishes to continue care with him due to feeling like he has overlooked her initial problems she had went to see him for. She also feels that since the last colonoscopy he did on her she is no longer able to hold her bowels like she could before. Records are available to view in Epic, please review them at your earliest convenience and advise on scheduling.  Thank You!

## 2022-03-22 ENCOUNTER — Encounter: Payer: Self-pay | Admitting: Gastroenterology

## 2022-03-22 NOTE — Telephone Encounter (Signed)
Patient scheduled 4/22 at 2:10

## 2022-06-03 ENCOUNTER — Telehealth: Payer: Self-pay

## 2022-06-03 ENCOUNTER — Encounter: Payer: Self-pay | Admitting: Gastroenterology

## 2022-06-03 ENCOUNTER — Ambulatory Visit (INDEPENDENT_AMBULATORY_CARE_PROVIDER_SITE_OTHER)
Admission: RE | Admit: 2022-06-03 | Discharge: 2022-06-03 | Disposition: A | Discharge status: Home / Self Care | Payer: BC Managed Care – PPO | Source: Ambulatory Visit | Attending: Gastroenterology | Admitting: Gastroenterology

## 2022-06-03 ENCOUNTER — Ambulatory Visit (INDEPENDENT_AMBULATORY_CARE_PROVIDER_SITE_OTHER): Payer: BC Managed Care – PPO | Admitting: Gastroenterology

## 2022-06-03 ENCOUNTER — Other Ambulatory Visit (INDEPENDENT_AMBULATORY_CARE_PROVIDER_SITE_OTHER): Payer: BC Managed Care – PPO

## 2022-06-03 VITALS — BP 108/64 | HR 81 | Ht 64.0 in | Wt 267.5 lb

## 2022-06-03 DIAGNOSIS — M8580 Other specified disorders of bone density and structure, unspecified site: Secondary | ICD-10-CM

## 2022-06-03 DIAGNOSIS — K529 Noninfective gastroenteritis and colitis, unspecified: Secondary | ICD-10-CM

## 2022-06-03 DIAGNOSIS — K50818 Crohn's disease of both small and large intestine with other complication: Secondary | ICD-10-CM

## 2022-06-03 DIAGNOSIS — R7989 Other specified abnormal findings of blood chemistry: Secondary | ICD-10-CM

## 2022-06-03 DIAGNOSIS — K743 Primary biliary cirrhosis: Secondary | ICD-10-CM

## 2022-06-03 LAB — CBC WITH DIFFERENTIAL/PLATELET
Basophils Absolute: 0.1 10*3/uL (ref 0.0–0.1)
Basophils Relative: 1.4 % (ref 0.0–3.0)
Eosinophils Absolute: 0.2 10*3/uL (ref 0.0–0.7)
Eosinophils Relative: 1.8 % (ref 0.0–5.0)
HCT: 40.6 % (ref 36.0–46.0)
Hemoglobin: 13.4 g/dL (ref 12.0–15.0)
Lymphocytes Relative: 40.2 % (ref 12.0–46.0)
Lymphs Abs: 4.2 10*3/uL — ABNORMAL HIGH (ref 0.7–4.0)
MCHC: 33 g/dL (ref 30.0–36.0)
MCV: 86 fl (ref 78.0–100.0)
Monocytes Absolute: 0.5 10*3/uL (ref 0.1–1.0)
Monocytes Relative: 4.6 % (ref 3.0–12.0)
Neutro Abs: 5.4 10*3/uL (ref 1.4–7.7)
Neutrophils Relative %: 52 % (ref 43.0–77.0)
Platelets: 619 10*3/uL — ABNORMAL HIGH (ref 150.0–400.0)
RBC: 4.72 Mil/uL (ref 3.87–5.11)
RDW: 14.8 % (ref 11.5–15.5)
WBC: 10.4 10*3/uL (ref 4.0–10.5)

## 2022-06-03 LAB — IBC + FERRITIN
Ferritin: 10.8 ng/mL (ref 10.0–291.0)
Iron: 74 ug/dL (ref 42–145)
Saturation Ratios: 18.9 % — ABNORMAL LOW (ref 20.0–50.0)
TIBC: 392 ug/dL (ref 250.0–450.0)
Transferrin: 280 mg/dL (ref 212.0–360.0)

## 2022-06-03 LAB — COMPREHENSIVE METABOLIC PANEL
ALT: 29 U/L (ref 0–35)
AST: 36 U/L (ref 0–37)
Albumin: 4 g/dL (ref 3.5–5.2)
Alkaline Phosphatase: 261 U/L — ABNORMAL HIGH (ref 39–117)
BUN: 8 mg/dL (ref 6–23)
CO2: 24 mEq/L (ref 19–32)
Calcium: 8.9 mg/dL (ref 8.4–10.5)
Chloride: 103 mEq/L (ref 96–112)
Creatinine, Ser: 0.65 mg/dL (ref 0.40–1.20)
GFR: 114.87 mL/min (ref >60.00)
Glucose, Bld: 86 mg/dL (ref 70–99)
Potassium: 3.8 mEq/L (ref 3.5–5.1)
Sodium: 137 mEq/L (ref 135–145)
Total Bilirubin: 0.7 mg/dL (ref 0.2–1.2)
Total Protein: 7.8 g/dL (ref 6.0–8.3)

## 2022-06-03 LAB — VITAMIN D 25 HYDROXY (VIT D DEFICIENCY, FRACTURES): VITD: 12.77 ng/mL — ABNORMAL LOW (ref 30.00–100.00)

## 2022-06-03 LAB — GAMMA GT: GGT: 103 U/L — ABNORMAL HIGH (ref 7–51)

## 2022-06-03 LAB — HIGH SENSITIVITY CRP: CRP, High Sensitivity: 12.04 mg/L — ABNORMAL HIGH (ref 0.000–5.000)

## 2022-06-03 MED ORDER — BUDESONIDE ER 9 MG PO TB24: 9.0000 mg | ORAL_TABLET | Freq: Every day | ORAL | 0 refills | Status: DC

## 2022-06-03 MED ORDER — URSODIOL 300 MG PO CAPS
600.0000 mg | ORAL_CAPSULE | Freq: Three times a day (TID) | ORAL | 5 refills | Status: DC
Start: 2022-06-03 — End: 2023-04-16

## 2022-06-03 MED ORDER — URSODIOL 300 MG PO CAPS
600.0000 mg | ORAL_CAPSULE | Freq: Two times a day (BID) | ORAL | 3 refills | Status: DC
Start: 2022-06-03 — End: 2022-06-03

## 2022-06-03 NOTE — Patient Instructions (Addendum)
Please go to the lab in the basement of our building to have lab work done as you leave today. Hit "B" for basement when you get on the elevator.  When the doors open the lab is on your left.  We will call you with the results. Thank you.  We have sent the following medications to your pharmacy for you to pick up at your convenience: Uceris 9 mg: Take once daily for 4 weeks  We will refer you to Hepatology. They will contact you to schedule an appointment. If you have not heard from them within a week or two please let us know.  You will be contacted by [facility] Scheduling in the next 2 days to arrange a RUQ ultrasound with Elastography.  The number on your caller ID will be [phone], please answer when they call.  If you have not heard from them in 2 days please call [phone] to schedule.    You can go to the basement of our building to have a bone density scan done. Radiology is straight ahead. If you want to come back another day they are open from 8:00 am to 5:00pm _________________________________________________  Please increase your Ursodiol to 600 mg (2 capsules) three times a day.   Thank you for entrusting me with your care and for choosing [facility], Dr. Ileene Patrick  If your blood pressure at your visit was 140/90 or greater, please contact your primary care physician to follow up on this. ______________________________________________________  If you are age 35 or older, your body mass index should be between 23-30. Your Body mass index is 45.92 kg/m. If this is out of the aforementioned range listed, please consider follow up with your Primary Care Provider.  If you are age 35 or younger, your body mass index should be between 19-25. Your Body mass index is 45.92 kg/m. If this is out of the aformentioned range listed, please consider follow up with your Primary Care Provider.  ________________________________________________________  The  [facility] providers would like to encourage you to use [facility] to communicate with providers for non-urgent requests or questions.  Due to long hold times on the telephone, sending your provider a message by [facility] may be a faster and more efficient way to get a response.  Please allow 48 business hours for a response.  Please remember that this is for non-urgent requests.  _______________________________________________________  Due to recent changes in healthcare laws, you may see the results of your imaging and laboratory studies on MyChart before your provider has had a chance to review them.  We understand that in some cases there may be results that are confusing or concerning to you. Not all laboratory results come back in the same time frame and the provider may be waiting for multiple results in order to interpret others.  Please give [place] 48 hours in order for your provider to thoroughly review all the results before contacting the office for clarification of your results.

## 2022-06-03 NOTE — Telephone Encounter (Signed)
Called patient and LM to clarify change in Ursodiol. Dr. Adela Lank wants her to increase to 600 mg THREE times a day. Asked patient to call back if any questions

## 2022-06-03 NOTE — Progress Notes (Signed)
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  Referred by Donzetta Sprung, MD.  This is her first time seeing [place].  I reviewed her records and history.   She states she has had intermittent elevations in her liver enzymes for years.  Denies any family history of liver disease or cirrhosis.  She states this dates back to at least 2012 with her pregnancy.  She does not drink any alcohol routinely.  She had a serologic workup in 2020 and ultimately a liver biopsy.  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  Stage I fibrosis noted at that time. She had positive ASMA but her IgG levels were normal.  She was started on ursodiol.  Prescribed 900 mg twice daily (based on weight, 13 to 15 mg/kg), however she states she had only been taking 600 mg twice daily as she did not tolerate the higher dosing and it made her feel poorly.  She does states she has been compliant with the regimen since she has been placed on it and does not really miss any doses.  She has not had any imaging of her liver since her diagnosis.  Denies any pruritus.  She has had a history of osteopenia on remote DEXA scan that was done in 2021.  I do not see any vitamin D levels in her chart, she is not taking any thing for her bones.  Her last labs were done by her primary care from 05/29/2022 -alk phos of 261, AST 26, ALT 22, T. bili 0.2.  BUN 9, creatinine 0.7.  Her alk phos last August 2023 was in the 400s with a mild AST and ALT elevation.  TSH 4.71, free T4 1.05.  She states she currently weighs 267 pounds, that has been stable over the past 2 years or so.  Before that she states she was over 315 pounds when she was previously diagnosed with PBC.  Otherwise back in 2020 she had an abnormal CT scan in February 2020 showing a  suspected ascending colon mass with lymphadenopathy.  Also ventral wall hernia noted.  This led to a colonoscopy the following month which showed some inflammatory changes in the right colon and edema, biopsies were taken and were normal.  She states over time she has developed diarrhea with significant urgency and mucus and rectal bleeding.  This led to a follow-up colonoscopy with Dr. Levon Hedger in March 2022.  She had overt colitis and most of her colon, biopsies showed chronic active colitis.  She also had ileitis on biopsies although the ileum appeared normal.  Of note the rectum and sigmoid colon were normal on biopsies.  It appears she was started on Lialda to treat this initially.  She states she had paradoxical worsening on this regimen and her diarrhea and GI symptoms got much worse so she stopped it.  She states he was not aware of any other treatment for her colitis, essentially has not been on anything since that time.  Her bowel function is very altered.  She goes more than 10 times per day.  She has urgency and mucus in her stools with occasional bleeding.  She states she has fecal incontinence due to the urgency usually 2 or more accidents per week.  This is really influencing her quality of life and her ability to  take care of her children.  She denies any routine use of NSAIDs.  She states the food she eats comes out undigested and has rapid transit.  She denies any family history of IBD.  Her mother has IBS but states she never had it evaluated.  She denies any tobacco use.  She is currently not taking anything for her bowels.  Prior workup: Celiac panel negative 04/27/20  Colonoscopy 05/05/20: - The examined portion of the ileum was normal. Biopsied. - Diffuse mild inflammation was found in the descending colon, in the transverse colon, in the ascending colon and in the cecum secondary to inflammatory bowel disease. Biopsied. - The distal rectum and anal verge are normal on retroflexion  view.  FINAL MICROSCOPIC DIAGNOSIS:  A. TERMINAL ILEUM, BIOPSY: - Chronic mildly active ileitis. - No granuloma. - No dysplasia or malignancy.  B. COLON, CECAL, BIOPSY: - Chronic severally active colitis. - No granulomas. - No dysplasia or malignancy.  C. COLON, ASCENDING, BIOPSY: - Chronic moderately active colitis. - No granulomas. - No dysplasia or malignancy.  D. COLON, TRANSVERSE, BIOPSY: - Chronic moderately active colitis. - No granulomas. - No dysplasia or malignancy.  E. COLON, DESCENDING, BIOPSY: - Chronic moderately active colitis. - No granulomas. - No dysplasia or malignancy.  F. COLON, SIGMOID, BIOPSY: - Benign colonic mucosa. - No active inflammation. - No granulomas. - No dysplasia or malignancy.  G. RECTUM, BIOPSY: - Benign colonic mucosa. - No active inflammation. - No granulomas. - No dysplasia or malignancy.  COMMENT:  A-E. The findings are consistent with inflammatory bowel disease and the features favor Crohn's.   Colonoscopy 04/17/18: - The examined portion of the ileum was normal. - Congested mucosa in the ascending colon and in the cecum. Biopsied. - External hemorrhoids.  Colon, biopsy, cecal and ascending - BENIGN COLONIC MUCOSA WITH UNDERLYING LYMPHOID AGGREGATES. - NO DYSPLASIA OR MALIGNANCY.   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 The biopsies show multiple portal-based granulomas, some with typical 'florid duct lesions'. Similar to overall inflammation, there is patchy but significant bile duct injury including changes  consistent with patchy ductopenia. There is associated mild ductular reaction and chronic cholestasis (confirmed with immunohistochemical stains for CK7). Many of the portal tracts also show onion skin-like periductal fibrosis and focal bile duct scars. The trichrome and reticulin stains show patchy, mild portal fibrosis (please note that the degree of fibrosis can be sometimes slightly exaggerated due to subcapsular nature of the biopsy). Patient's elevated serum alkaline phosphatase levels and negative anti-mitochondrial antibody titer are noted. 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 Differential diagnosis can include sarcoidosis and primary sclerosing cholangitis (small duct type) but appear to be less likely. Rarely, drugs can cause biliary tract patterns of injury with granulomas. PASD stain is negative for intracytoplasmic globules. Iron stain is negative for stainable iron.   Past Medical History:  Diagnosis Date   Breast nodule 10/10/2014   Breast pain 10/10/2014   BV (bacterial vaginosis) 11/12/2012   Depression  Fatty liver    Hypothyroid 02/14/2015   Irregular menses 05/21/2013   Migraines    Obesity    Osteopenia    Other and unspecified ovarian cyst 05/31/2013   Right complex ?cystic vs solid mass on ovary will schedule appt with [facility]   Pregnant 02/14/2015   Primary biliary cholangitis    Thyroid disease    Reported Hashimoto's Thyroiditis in past     Past Surgical History:  Procedure Laterality Date   BIOPSY  04/17/2018   Procedure: BIOPSY;  Surgeon: Malissa Hippo, MD;  [facility];  Service: Endoscopy;;  colon   BIOPSY  05/05/2020   Procedure: BIOPSY;  Surgeon: Dolores Frame, MD;  Location: [facility];  Service:  Gastroenterology;;   CESAREAN SECTION     COLONOSCOPY     COLONOSCOPY WITH PROPOFOL N/A 04/17/2018   Procedure: COLONOSCOPY WITH PROPOFOL;  Surgeon: Malissa Hippo, MD;  [facility]: [facility];  Service: Endoscopy;  Laterality: N/A;  10:50   COLONOSCOPY WITH PROPOFOL N/A 05/05/2020   Procedure: COLONOSCOPY WITH PROPOFOL;  Surgeon: Dolores Frame, MD;  Location: [facility];  Service: Gastroenterology;  Laterality: N/A;  AM   LIVER BIOPSY N/A 08/17/2018   Procedure: LIVER BIOPSY;  Surgeon: Lucretia Roers, MD;  Location: [facility];  Service: General;  Laterality: N/A;   OOPHORECTOMY Right 10/2013   TONSILLECTOMY     TUBAL LIGATION Bilateral 08/17/2018   Procedure: LAPAROSCOPIC BILATERAL TUBAL LIGATION WITH FALLOPE RINGS;  Surgeon: Tilda Burrow, MD;  Location: [facility];  Service: Gynecology;  Laterality: Bilateral;   VENTRAL HERNIA REPAIR N/A 08/17/2018   Procedure: LAPAROSCOPIC VENTRAL HERNIA WITH MESH;  Surgeon: Lucretia Roers, MD;  Location: [facility];  Service: General;  Laterality: N/A;   WISDOM TOOTH EXTRACTION     Family History  Problem Relation Age of Onset   Diabetes Mother    Hypertension Mother    Heart attack Mother    Stroke Mother    Seizures Father    Other Father        back problems   Diabetes Maternal Grandmother    Hypertension Maternal Grandmother    Cancer Maternal Grandmother        breast   Diabetes Maternal Grandfather    Hypertension Maternal Grandfather    Leukemia Maternal Grandfather    AAA (abdominal aortic aneurysm) Maternal Grandfather    Dementia Paternal Grandmother    Kidney disease Paternal Grandfather    Cancer Maternal Aunt    Diabetes Maternal Uncle    Colon cancer Cousin    Ovarian cancer Cousin    Stomach cancer Other    Ulcerative colitis Neg Hx    Crohn's disease Neg Hx    Social History   Tobacco Use   Smoking status: Former    Packs/day: 0.25    Years: 8.00    Additional pack years: 0.00    Total pack  years: 2.00    Types: Cigarettes    Quit date: 10/07/2010    Years since quitting: 11.6   Smokeless tobacco: Never  Vaping Use   Vaping Use: Never used  Substance Use Topics   Alcohol use: Yes    Comment: rare   Drug use: No   Current Outpatient Medications  Medication Sig Dispense Refill   calcium-vitamin D (OSCAL WITH D) 500-200 MG-UNIT tablet Take 1 tablet by mouth daily with breakfast. 90 tablet 1   dicyclomine (BENTYL) 10 MG capsule Take 1 capsule (10  mg total) by mouth every 12 (twelve) hours as needed for spasms. 60 capsule 1   levothyroxine (SYNTHROID) 137 MCG tablet Take 137 mcg by mouth daily.     OVER THE COUNTER MEDICATION Take 200 mg by mouth daily. Magnesium     ursodiol (ACTIGALL) 300 MG capsule Take 3 capsules (900 mg total) by mouth 2 (two) times daily. 180 capsule 3   No current facility-administered medications for this visit.   Allergies  Allergen Reactions   Imitrex [Sumatriptan]     Makes migraines worse.    Maxalt [Rizatriptan]     Makes migraines worse   Phenergan [Promethazine Hcl] Nausea And Vomiting    Pill form; IV ok   Tape Other (See Comments)    Blisters on abdomen after C Section     Review of Systems: All systems reviewed and negative except where noted in HPI.   Labs per HPI  Physical Exam: BP 108/64 (BP Location: Left Arm, Patient Position: Sitting, Cuff Size: Normal)   Pulse 81   Ht 5\' 4"  (1.626 m)   Wt 267 lb 8 oz (121.3 kg)   SpO2 99%   BMI 45.92 kg/m  Constitutional: Pleasant,well-developed, female in no acute distress. HEENT: Normocephalic and atraumatic. Conjunctivae are normal. No scleral icterus. Neck supple.  Cardiovascular: Normal rate, regular rhythm.  Pulmonary/chest: Effort normal and breath sounds normal. No wheezing, rales or rhonchi. Abdominal: Soft, nondistended, nontender. There are no masses palpable. No hepatomegaly. Extremities: no edema Lymphadenopathy: No cervical adenopathy noted. Neurological: Alert and  oriented to person place and time. Skin: Skin is warm and dry. No rashes noted. Psychiatric: Normal mood and affect. Behavior is normal.   ASSESSMENT: 37 y.o. female here for assessment of the following  1. Crohn's disease of both small and large intestine with other complication   2. Chronic diarrhea   3. Elevated liver function tests   4. Primary biliary cholangitis   5. Osteopenia, unspecified location    New patient assessment for the following issues which we discussed at length.  It appears she has colonic Crohn's disease perhaps with some mild ileitis.  She has chronic diarrhea with fecal incontinence, passing mucus with urgency, and bleeding.  Prior colonoscopy very suggestive of Crohn's disease.  Sounds like initially placed on mesalamine and had paradoxical worsening and stopped it.  She is has not been on anything since then.  We discussed what Crohn's diseases, long-term risks of this, management options.  She needs some baseline labs for reassessment.  Will check CBC, CRP, vitamin D, iron studies, QuantiFERON gold, hepatitis B testing.  Fecal calprotectin, to restage her disease.  Ultimately she has significant symptoms, goals of care are to improve her quality of life, resolve symptoms, reduce her risk for colon cancer.  Given she failed mesalamine, she will warrant biologic therapy.  We discussed several options today, I think ultimately given her other comorbidities and safety profile of some of these regimens, Entyvio may be a good first option for her.  Following discussion of it she is very interested.  Will await her lab work first, if her insurance will cover Entyvio first-line that would be her preference.  We can discuss if she wants to start with IV and then transition to subcu pending her course.  In the interim, recommend trial of steroids to get her feeling better.  If her insurance covers it, we will give her Uceris 9 mg daily for 30 days.  If Uceris is not covered, we  will  give generic budesonide or consider systemic prednisone if any significant anemia or iron deficiency etc.  Would like to avoid prednisone use given propensity to cause weight gain and other side effects etc.  We additionally discussed her liver disease.  While it is suspected she has PBC it is not definitive.  I think she would benefit from a hepatology reviewing her liver biopsy slide and reassessment to confirm her diagnosis.  I will repeat her LFTs and fractionate GGT to see if this alk phos is biliary in etiology.  If so she may need adjustment of her regimen.  If she has PBC, her ursodiol dosing is subtherapeutic, will increase her dose to 13 to 15 mg/kg.  She states she tolerates 600 mg at a time but not 900, will increase her dose to 600 mg 3 times daily.  Given chronicity of this, recommend interval imaging of her liver with a right upper quadrant ultrasound I will add elastography.  I will also recheck an AMA.  Otherwise reviewed her bone health with her, she was osteopenic a few years ago, not on anything for that.  Will check a vitamin D level, repeat a DEXA scan.  I will see her back in the office in 6 to 8 weeks for reassessment.  Initially await labs, imaging of her liver, consultation with hepatology.  Hopefully we can start her on biologic therapy for her colitis in the upcoming weeks.  Hopefully she responds well to Uceris in the interim.  Further recommendations pending the results and her course.  PLAN: - lab today - fecal calprotectin - lab today - CBC, CMET, GGT, CRP, vitamin D, TIBC / ferritin, AMA, quantiferon gold, hepatitis B surface antigen, hepatitis B core antibody - start Uceris  / day for 4 weeks if she can get it - if not, let us know, will use generic budesonide - likely start Entyvio in the upcoming weeks if approved by insurance - continue Ursodiol - will see if she can go to  TID (couldn't tolerate  BID) - schedule RUQ [place] with elastography -  referral to Hepatology for liver disease - confirm diagnosis of suspected AMA negative PBC - schedule DEXA scan for osteopenia - f/u 6-8 weeks  I spent 65 minutes of time, including in depth chart review, independent review of results as outlined above, face-to-face time with the patient, and documentation.    Harlin Rain, MD [facility]  CC: Richardean Chimera, MD

## 2022-06-04 ENCOUNTER — Telehealth: Payer: Self-pay

## 2022-06-04 ENCOUNTER — Other Ambulatory Visit: Payer: Self-pay

## 2022-06-04 DIAGNOSIS — K50818 Crohn's disease of both small and large intestine with other complication: Secondary | ICD-10-CM | POA: Diagnosis not present

## 2022-06-04 DIAGNOSIS — K743 Primary biliary cirrhosis: Secondary | ICD-10-CM | POA: Diagnosis not present

## 2022-06-04 DIAGNOSIS — K529 Noninfective gastroenteritis and colitis, unspecified: Secondary | ICD-10-CM | POA: Diagnosis not present

## 2022-06-04 DIAGNOSIS — R7989 Other specified abnormal findings of blood chemistry: Secondary | ICD-10-CM | POA: Diagnosis not present

## 2022-06-04 LAB — HEPATITIS B CORE ANTIBODY, TOTAL: Hep B Core Total Ab: NONREACTIVE

## 2022-06-04 MED ORDER — BUDESONIDE ER 9 MG PO CP24: 9.0000 mg | ORAL_CAPSULE | Freq: Every day | ORAL | 0 refills | Status: DC

## 2022-06-04 NOTE — Telephone Encounter (Signed)
Referral faxed to [facility] Fax: [phone]. Ph: [phone]

## 2022-06-05 ENCOUNTER — Telehealth: Payer: Self-pay | Admitting: Gastroenterology

## 2022-06-05 LAB — QUANTIFERON-TB GOLD PLUS
Mitogen-NIL: >10 IU/mL
NIL: 0.02 IU/mL
QuantiFERON-TB Gold Plus: NEGATIVE
TB1-NIL: 0 IU/mL
TB2-NIL: 0.01 IU/mL

## 2022-06-05 NOTE — Telephone Encounter (Signed)
Inbound call from Maralyn Sago at [employer] in regards to Budenoside, States that medication is not on her formulary and Medicaid requires her to either try Liadol first or to have a prior authorization sent. Stated nurse could call back to dicuss provider's decision, person of contact either Maralyn Sago or [place].

## 2022-06-05 NOTE — Telephone Encounter (Signed)
Patient has been scheduled for an appointment on 08-16-22 at 11am

## 2022-06-06 ENCOUNTER — Other Ambulatory Visit: Payer: BC Managed Care – PPO

## 2022-06-06 ENCOUNTER — Telehealth: Payer: Self-pay | Admitting: Pharmacy Technician

## 2022-06-06 ENCOUNTER — Other Ambulatory Visit (HOSPITAL_COMMUNITY): Payer: Self-pay

## 2022-06-06 ENCOUNTER — Other Ambulatory Visit: Payer: Self-pay

## 2022-06-06 DIAGNOSIS — M8580 Other specified disorders of bone density and structure, unspecified site: Secondary | ICD-10-CM

## 2022-06-06 DIAGNOSIS — K743 Primary biliary cirrhosis: Secondary | ICD-10-CM

## 2022-06-06 DIAGNOSIS — K50818 Crohn's disease of both small and large intestine with other complication: Secondary | ICD-10-CM

## 2022-06-06 DIAGNOSIS — R7989 Other specified abnormal findings of blood chemistry: Secondary | ICD-10-CM

## 2022-06-06 DIAGNOSIS — K529 Noninfective gastroenteritis and colitis, unspecified: Secondary | ICD-10-CM

## 2022-06-06 MED ORDER — VITAMIN D3 125 MCG (5000 UT) PO CAPS: 1.0000 | ORAL_CAPSULE | Freq: Every day | ORAL | 0 refills | Status: DC

## 2022-06-06 MED ORDER — CALCIUM 1200 1200-1000 MG-UNIT PO CHEW: 1.0000 | CHEWABLE_TABLET | Freq: Every day | ORAL | 0 refills | Status: AC

## 2022-06-06 NOTE — Telephone Encounter (Signed)
Patient Advocate Encounter  Received notification from [facility] that prior authorization for BUDESONIDE  is required.   PA submitted on 4.25.24 Key [id number] Status is pending

## 2022-06-10 ENCOUNTER — Other Ambulatory Visit: Payer: Self-pay

## 2022-06-10 LAB — MITOCHONDRIAL ANTIBODIES: Mitochondrial M2 Ab, IgG: <=20 U (ref <=20.0)

## 2022-06-10 LAB — HEPATITIS B SURFACE ANTIGEN: Hepatitis B Surface Ag: NONREACTIVE

## 2022-06-10 NOTE — Telephone Encounter (Signed)
Yes that is fine, thank you [name]

## 2022-06-10 NOTE — Telephone Encounter (Signed)
Patient Advocate Encounter  Received a fax from [facility] regarding Prior Authorization for Budesonide ER 9MG  er tablets.   Authorization has been DENIED due to

## 2022-06-11 MED ORDER — BUDESONIDE 3 MG PO CPEP: 9.0000 mg | ORAL_CAPSULE | Freq: Every day | ORAL | 3 refills | Status: DC

## 2022-06-11 NOTE — Telephone Encounter (Signed)
Budesonide 3 mg (3 caps= 9mg ) once daily sent to [facility] pharmacy in [place]

## 2022-06-12 ENCOUNTER — Other Ambulatory Visit: Payer: Self-pay

## 2022-06-12 ENCOUNTER — Other Ambulatory Visit (HOSPITAL_COMMUNITY): Payer: Self-pay

## 2022-06-12 MED ORDER — BUDESONIDE 3 MG PO CPEP: 9.0000 mg | ORAL_CAPSULE | Freq: Every day | ORAL | 0 refills | Status: DC

## 2022-06-12 NOTE — Progress Notes (Signed)
Script for budesonide sent to [facility] per patient request

## 2022-06-13 ENCOUNTER — Other Ambulatory Visit: Payer: Self-pay | Admitting: Gastroenterology

## 2022-06-13 DIAGNOSIS — K50818 Crohn's disease of both small and large intestine with other complication: Secondary | ICD-10-CM

## 2022-06-13 DIAGNOSIS — K5090 Crohn's disease, unspecified, without complications: Secondary | ICD-10-CM | POA: Insufficient documentation

## 2022-06-13 LAB — CALPROTECTIN, FECAL: Calprotectin, Fecal: 2850 ug/g — ABNORMAL HIGH (ref 0–120)

## 2022-06-17 ENCOUNTER — Telehealth: Payer: Self-pay | Admitting: Pharmacy Technician

## 2022-06-17 NOTE — Telephone Encounter (Signed)
Dr. Damita Lack,  Patient has been approved and will be scheduled as soon as possible.  Auth Submission: APPROVED Site of care: Site of care: [facility] Payer: BCBS Medication & CPT/J Code(s) submitted: Entyvio (Vedolizumab) [id number] Route of submission (phone, fax, portal):  Phone # [phone] Fax #[phone] Auth type: Buy/Bill Units/visits requested: 7 DOSES (2100 units) Reference number: [id number] Auth # [id number] Approval from:   06/17/22 to  06/17/23  [facility] co-pay card: APPROVED ID: [id number] GR: [id number] BIN: [id number] PCN: [id number]

## 2022-06-18 ENCOUNTER — Ambulatory Visit (HOSPITAL_COMMUNITY)
Admission: RE | Admit: 2022-06-18 | Discharge: 2022-06-18 | Disposition: A | Discharge status: Home / Self Care | Payer: BC Managed Care – PPO | Source: Ambulatory Visit | Attending: Gastroenterology | Admitting: Gastroenterology

## 2022-06-18 ENCOUNTER — Encounter: Payer: Self-pay | Admitting: Gastroenterology

## 2022-06-18 DIAGNOSIS — K743 Primary biliary cirrhosis: Secondary | ICD-10-CM | POA: Diagnosis present

## 2022-06-18 DIAGNOSIS — K529 Noninfective gastroenteritis and colitis, unspecified: Secondary | ICD-10-CM | POA: Insufficient documentation

## 2022-06-18 DIAGNOSIS — M8580 Other specified disorders of bone density and structure, unspecified site: Secondary | ICD-10-CM | POA: Insufficient documentation

## 2022-06-18 DIAGNOSIS — R7989 Other specified abnormal findings of blood chemistry: Secondary | ICD-10-CM | POA: Insufficient documentation

## 2022-06-18 DIAGNOSIS — K50818 Crohn's disease of both small and large intestine with other complication: Secondary | ICD-10-CM | POA: Diagnosis not present

## 2022-06-18 NOTE — Telephone Encounter (Signed)
Noted, thanks!

## 2022-06-18 NOTE — Telephone Encounter (Signed)
Great thank you!

## 2022-06-26 ENCOUNTER — Ambulatory Visit (INDEPENDENT_AMBULATORY_CARE_PROVIDER_SITE_OTHER): Payer: BC Managed Care – PPO

## 2022-06-26 VITALS — BP 101/73 | HR 64 | Temp 98.2°F | Resp 18 | Ht 64.0 in | Wt 270.2 lb

## 2022-06-26 DIAGNOSIS — K50818 Crohn's disease of both small and large intestine with other complication: Secondary | ICD-10-CM

## 2022-06-26 MED ORDER — VEDOLIZUMAB 300 MG IV SOLR
300.0000 mg | Freq: Once | INTRAVENOUS | Status: AC
  Administered 2022-06-26: 300 mg via INTRAVENOUS
  Filled 2022-06-26: qty 5

## 2022-06-26 NOTE — Patient Instructions (Signed)
Vedolizumab Injection What is this medication? VEDOLIZUMAB (ve doe LIZ you mab) treats inflammatory bowel diseases. It works by decreasing inflammation in the digestive tract. This medicine may be used for other purposes; ask your health care provider or pharmacist if you have questions. COMMON BRAND NAME(S): Entyvio What should I tell my care team before I take this medication? They need to know if you have any of these conditions: Immune system problems Infection, such a tuberculosis (TB) or other bacterial, fungal, or viral infections Liver disease Recent or upcoming vaccine An unusual or allergic reaction to vedolizumab, other medications, foods, dyes, or preservatives Pregnant or trying to get pregnant Breastfeeding How should I use this medication? This medication is injected into a vein or under the skin. It is given by your care team in a hospital or clinic setting if it is injected into a vein. If it is injected under the skin, it may be given at home. If you get this medication at home, you will be taught how to prepare and give it. Use exactly as directed. Take it as directed on the prescription label. Keep taking it unless your care team tells you to stop. A special MedGuide will be given to you by the pharmacist with each prescription and refill. Be sure to read this information carefully each time. This medication comes with INSTRUCTIONS FOR USE. Ask your pharmacist for directions on how to use this medication. Read the information carefully. Talk to your pharmacist or care team if you have questions. Talk to your care team about the use of this medication in children. Special care may be needed. Overdosage: If you think you have taken too much of this medicine contact a poison control center or emergency room at once. NOTE: This medicine is only for you. Do not share this medicine with others. What if I miss a dose? If you get this medication at the hospital or clinic: It is  important not to miss your dose. Call your care team if you are unable to keep an appointment. If you give yourself this medication at home: If you miss a dose, take it as soon as you can. Then resume dosing every 2 weeks. Do not take double or extra doses. Call your care team with questions. What may interact with this medication? Live virus vaccines Natalizumab TNF blockers, such as adalimumab or infliximab This list may not describe all possible interactions. Give your health care provider a list of all the medicines, herbs, non-prescription drugs, or dietary supplements you use. Also tell them if you smoke, drink alcohol, or use illegal drugs. Some items may interact with your medicine. What should I watch for while using this medication? Visit your care team for regular checks on your progress. Tell your care team if your symptoms do not start to get better or if they get worse. This medication may increase your risk of getting an infection. Call your care team for advice if you get a fever, chills, sore throat, or other symptoms of a cold or flu. Do not treat yourself. Try to avoid being around people who are sick. In some patients, this medication may cause a serious brain infection that may cause death. If you have any problems seeing, thinking, speaking, walking, or standing, tell your care team right away. If you cannot reach your care team, urgently seek other source of medical care. What side effects may I notice from receiving this medication? Side effects that you should report to your care   team as soon as possible: Allergic reactions--skin rash, itching, hives, swelling of the face, lips, tongue, or throat Dizziness, loss of balance or coordination, confusion or trouble speaking Infection--fever, chills, cough, sore throat, wounds that don't heal, pain or trouble when passing urine, general feeling of discomfort or being unwell Infusion reactions--chest pain, shortness of breath or  trouble breathing, feeling faint or lightheaded Liver injury--right upper belly pain, loss of appetite, nausea, light-colored stool, dark yellow or brown urine, yellowing skin or eyes, unusual weakness or fatigue Side effects that usually do not require medical attention (report these to your care team if they continue or are bothersome): Headache Fatigue Joint pain Nausea This list may not describe all possible side effects. Call your doctor for medical advice about side effects. You may report side effects to FDA at 1-800-FDA-1088. Where should I keep my medication? Keep out of the reach of children and pets. Store in a refrigerator or at room temperature between 20 and 25 degrees C (68 and 77 degrees F). Refrigeration (preferred): Store in the refrigerator. Do not freeze. Keep unopened prefilled syringes or pens in the original packaging to protect from light. Get rid of any unused medication after the expiration date. Room temperature: This medication may be stored at room temperature for up to 7 days. Keep unopened prefilled syringes or pens in the original packaging to protect from light. Get rid of any unused medication after 7 days, or after it expires, whichever is first. To get rid of medications that are no longer needed or have expired: Take the medication to a medication take-back program. Check with your pharmacy or law enforcement to find a location. If you cannot return the medication, ask your pharmacist or care team how to get rid of this medication safely. NOTE: This sheet is a summary. It may not cover all possible information. If you have questions about this medicine, talk to your doctor, pharmacist, or health care provider.  2023 Elsevier/Gold Standard (2021-11-23 00:00:00)  

## 2022-06-26 NOTE — Progress Notes (Signed)
Diagnosis: Crohn's Disease  Provider:  Chilton Greathouse MD  Procedure: IV Infusion  IV Type: Peripheral, IV Location: R Forearm  Entyvio (Vedolizumab), Dose: 300 mg  Infusion Start Time: 1419  Infusion Stop Time: 1459  Post Infusion IV Care: Observation period completed and Peripheral IV Discontinued  Discharge: Condition: Good, Destination: Home . AVS Provided  Performed by:  Adriana Mccallum, RN

## 2022-07-10 ENCOUNTER — Ambulatory Visit (INDEPENDENT_AMBULATORY_CARE_PROVIDER_SITE_OTHER): Payer: BC Managed Care – PPO

## 2022-07-10 VITALS — BP 93/65 | HR 89 | Temp 97.8°F | Resp 16 | Ht 64.0 in | Wt 273.2 lb

## 2022-07-10 DIAGNOSIS — K50818 Crohn's disease of both small and large intestine with other complication: Secondary | ICD-10-CM

## 2022-07-10 MED ORDER — VEDOLIZUMAB 300 MG IV SOLR
300.0000 mg | Freq: Once | INTRAVENOUS | Status: AC
  Administered 2022-07-10: 300 mg via INTRAVENOUS
  Filled 2022-07-10: qty 5

## 2022-07-10 NOTE — Progress Notes (Signed)
Diagnosis: Crohn's Disease  Provider:  Chilton Greathouse MD  Procedure: IV Infusion  IV Type: Peripheral, IV Location: R Antecubital  Entyvio (Vedolizumab), Dose: 300 mg  Infusion Start Time: 1114  Infusion Stop Time: 1156  Post Infusion IV Care: Peripheral IV Discontinued  Discharge: Condition: Good, Destination: Home . AVS Provided  Performed by:  Marilynn Rail, RN

## 2022-08-08 ENCOUNTER — Ambulatory Visit (INDEPENDENT_AMBULATORY_CARE_PROVIDER_SITE_OTHER): Payer: BC Managed Care – PPO

## 2022-08-08 VITALS — BP 103/72 | HR 67 | Temp 98.1°F | Resp 18 | Ht 64.0 in | Wt 275.2 lb

## 2022-08-08 DIAGNOSIS — K50818 Crohn's disease of both small and large intestine with other complication: Secondary | ICD-10-CM | POA: Diagnosis not present

## 2022-08-08 MED ORDER — VEDOLIZUMAB 300 MG IV SOLR
300.0000 mg | Freq: Once | INTRAVENOUS | Status: AC
  Administered 2022-08-08: 300 mg via INTRAVENOUS
  Filled 2022-08-08: qty 5

## 2022-08-08 NOTE — Progress Notes (Signed)
Diagnosis: Crohn's Disease  Provider:  Chilton Greathouse MD  Procedure: IV Infusion  IV Type: Peripheral, IV Location: R Forearm  Entyvio (Vedolizumab), Dose: 300 mg  Infusion Start Time: 1109  Infusion Stop Time: 1146  Post Infusion IV Care: Peripheral IV Discontinued  Discharge: Condition: Good, Destination: Home . AVS Declined  Performed by:  Adriana Mccallum, RN

## 2022-08-14 ENCOUNTER — Other Ambulatory Visit: Payer: Self-pay | Admitting: Nurse Practitioner

## 2022-08-14 DIAGNOSIS — R7989 Other specified abnormal findings of blood chemistry: Secondary | ICD-10-CM

## 2022-08-14 DIAGNOSIS — K743 Primary biliary cirrhosis: Secondary | ICD-10-CM

## 2022-08-14 DIAGNOSIS — K7401 Hepatic fibrosis, early fibrosis: Secondary | ICD-10-CM

## 2022-08-27 ENCOUNTER — Other Ambulatory Visit: Payer: BC Managed Care – PPO

## 2022-08-27 ENCOUNTER — Ambulatory Visit (INDEPENDENT_AMBULATORY_CARE_PROVIDER_SITE_OTHER): Payer: BC Managed Care – PPO | Admitting: Gastroenterology

## 2022-08-27 ENCOUNTER — Encounter: Payer: Self-pay | Admitting: Gastroenterology

## 2022-08-27 VITALS — BP 104/70 | HR 72 | Ht 64.0 in | Wt 275.4 lb

## 2022-08-27 DIAGNOSIS — M8580 Other specified disorders of bone density and structure, unspecified site: Secondary | ICD-10-CM

## 2022-08-27 DIAGNOSIS — E559 Vitamin D deficiency, unspecified: Secondary | ICD-10-CM | POA: Diagnosis not present

## 2022-08-27 DIAGNOSIS — R748 Abnormal levels of other serum enzymes: Secondary | ICD-10-CM

## 2022-08-27 DIAGNOSIS — K50818 Crohn's disease of both small and large intestine with other complication: Secondary | ICD-10-CM

## 2022-08-27 NOTE — Patient Instructions (Addendum)
Continue Entyvio.  We will contact the infusion center to arrange every 4 weeks infusions.  You have been scheduled for a follow up appointment on Thursday, 12-19-22 at 8:30am. Please arrive 10 minutes early for registration. If you need to reschedule or cancel this appointment please call [phone] as soon as possible. Thank you.  Increase your Vitamin D to 5,000 units daily.  Continue ursodiol.  Thank you for entrusting me with your care and for choosing [facility], Dr. Ileene Patrick   If your blood pressure at your visit was 140/90 or greater, please contact your primary care physician to follow up on this. ______________________________________________________  If you are age 12 or older, your body mass index should be between 23-30. Your Body mass index is 47.27 kg/m. If this is out of the aforementioned range listed, please consider follow up with your Primary Care Provider.  If you are age 76 or younger, your body mass index should be between 19-25. Your Body mass index is 47.27 kg/m. If this is out of the aformentioned range listed, please consider follow up with your Primary Care Provider.  ________________________________________________________  The [facility] providers would like to encourage you to use [facility] to communicate with providers for non-urgent requests or questions.  Due to long hold times on the telephone, sending your provider a message by [facility] may be a faster and more efficient way to get a response.  Please allow 48 business hours for a response.  Please remember that this is for non-urgent requests.  _______________________________________________________  Due to recent changes in healthcare laws, you may see the results of your imaging and laboratory studies on MyChart before your provider has had a chance to review them.  We understand that in some cases there may be results that are confusing or concerning to you. Not all laboratory results  come back in the same time frame and the provider may be waiting for multiple results in order to interpret others.  Please give [place] 48 hours in order for your provider to thoroughly review all the results before contacting the office for clarification of your results.

## 2022-08-27 NOTE — Progress Notes (Signed)
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    Recap of major GI issues:  Liver disease: she has had intermittent elevations in her liver enzymes for years.  Denies any family history of liver disease or cirrhosis.  She states this dates back to at least 2012 with her pregnancy.  She does not drink any alcohol routinely.  She had a serologic workup in 2020 and ultimately a liver biopsy.  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  Stage I fibrosis noted at that time. She had positive ASMA but her IgG levels were normal. She was started on ursodiol.  Prescribed 900 mg twice daily (based on weight, 13 to 15 mg/kg). She has had a history of osteopenia on remote DEXA scan that was done in 2021. She also has a vitamin D deficiency  Crohn's disease: Back in 2020 she had an abnormal CT scan in February 2020 showing a suspected ascending colon mass with lymphadenopathy.  Also ventral wall hernia noted.  This led to a colonoscopy the following month which showed some inflammatory changes in the right colon and edema, biopsies were taken and were normal.   She states over time she has developed diarrhea with significant urgency and mucus and rectal bleeding.  This led to a follow-up colonoscopy with Dr. Levon Hedger in March 2022.  She had overt colitis and most of her colon, biopsies showed chronic active colitis.  She also had ileitis on biopsies although the ileum appeared normal.  Of note the rectum and sigmoid colon were normal on biopsies.  It appears she was started on Lialda to treat this initially.  She states she had paradoxical worsening on this regimen and her diarrhea and GI symptoms got much worse so she stopped it.  She states he was not aware of any other treatment for her colitis, essentially has not been on anything since that time.    SINCE LAST VISIT:  She had  some lab workup since I last saw her in April.  No anemia, iron studies okay.  Her fecal calprotectin was markedly elevated in the 2000's.  We had discussed options and started her on Entyvio.  She has received 3 Entyvio infusions so far.  She felt some joint pains within a day after the infusions, perhaps with some sore throat as well but otherwise tolerated it.  She feels that the infusions have helped her so far and she is better than she was before, however still having some symptoms.  Her stool frequency has decreased and is variable. She can still have multiple loose stools per day with urgency however it is not as severe as previous.  The mucus in her stool is gone.  She has occasional blood.  She still has some occasional urgency but she can hold her stool much better than she has before.  She did get a 1 month budesonide taper while we are transitioning to this for her.  Overall, she does feel that it has benefited her however she thinks her colitis still remains active.  She has just completed the loading protocol due for her next dose at the end of August.  In regards to her liver disease, I performed right upper quadrant ultrasound with elastography at her last visit and there was no evidence of any significant fibrotic change.  She did have some steatosis noted there.  She has  continued ursodiol.  I referred her to see hepatology, she saw [facility]  IgG4 level was sent and normal.  They have requested an MRCP to assess for possible underlying PSC.  This is pending.  She was noted to have severe vitamin D deficiency and I instructed her to take 5000 units of vitamin D daily, she is only been taking 1000 units/day.  DEXA scan was updated and she has osteopenia.  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   Prior workup: Celiac panel negative 04/27/20   Colonoscopy 05/05/20: - The examined portion of the ileum was normal. Biopsied. - Diffuse mild  inflammation was found in the descending colon, in the transverse colon, in the ascending colon and in the cecum secondary to inflammatory bowel disease. Biopsied. - The distal rectum and anal verge are normal on retroflexion view.   FINAL MICROSCOPIC DIAGNOSIS:  A. TERMINAL ILEUM, BIOPSY: - Chronic mildly active ileitis. - No granuloma. - No dysplasia or malignancy.  B. COLON, CECAL, BIOPSY: - Chronic severally active colitis. - No granulomas. - No dysplasia or malignancy.  C. COLON, ASCENDING, BIOPSY: - Chronic moderately active colitis. - No granulomas. - No dysplasia or malignancy.  D. COLON, TRANSVERSE, BIOPSY: - Chronic moderately active colitis. - No granulomas. - No dysplasia or malignancy.  E. COLON, DESCENDING, BIOPSY: - Chronic moderately active colitis. - No granulomas. - No dysplasia or malignancy.  F. COLON, SIGMOID, BIOPSY: - Benign colonic mucosa. - No active inflammation. - No granulomas. - No dysplasia or malignancy.  G. RECTUM, BIOPSY: - Benign colonic mucosa. - No active inflammation. - No granulomas. - No dysplasia or malignancy.  COMMENT:  A-E. The findings are consistent with inflammatory bowel disease and the features favor Crohn's.     Colonoscopy 04/17/18: - The examined portion of the ileum was normal. - Congested mucosa in the ascending colon and in the cecum. Biopsied. - External hemorrhoids.  Colon, biopsy, cecal and ascending - BENIGN COLONIC MUCOSA WITH UNDERLYING LYMPHOID AGGREGATES. - NO DYSPLASIA OR MALIGNANCY.     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 The biopsies show multiple portal-based granulomas, some with typical 'florid duct lesions'. Similar to overall inflammation, there is patchy but significant bile duct injury including changes consistent with patchy ductopenia. There is associated mild ductular reaction and chronic cholestasis (confirmed with immunohistochemical stains for CK7). Many of the portal tracts also show onion skin-like periductal fibrosis and focal bile duct scars. The trichrome and reticulin stains show patchy, mild portal fibrosis (please note that the degree of fibrosis can be sometimes slightly exaggerated due to subcapsular nature of the biopsy). Patient's elevated serum alkaline phosphatase levels and negative anti-mitochondrial antibody titer are noted. 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 Differential diagnosis can include sarcoidosis and primary sclerosing cholangitis (small duct type) but appear to be less likely. Rarely, drugs can cause biliary tract patterns of injury with granulomas. PASD stain is negative for intracytoplasmic  globules. Iron stain is negative for stainable iron.    Fecal calprotectin 06/06/22: 2850  QG negative Hep B surface antigen and hep B core AB negative AMA negative GGT 103 Vit D - 12.77 CRP 12  Lab Results  Component Value Date   IRON 74 06/03/2022   TIBC 392.0 06/03/2022   FERRITIN 10.8 06/03/2022    RUQ [place] elastography 06/18/22: IMPRESSION: ULTRASOUND RUQ:   Increased parenchymal echogenicity, suggesting hepatic steatosis.   ULTRASOUND HEPATIC ELASTOGRAPHY:   Median kPa:  3.3   Diagnostic category:  < or = 5 kPa: high probability of being normal   MRCP scheduled for 8/23 IgG4 45     Past Medical History:   Diagnosis Date   Breast nodule 10/10/2014   Breast pain 10/10/2014   BV (bacterial vaginosis) 11/12/2012   Crohn's disease (HCC)    Depression    Fatty liver    Hashimoto's disease    Hypothyroid 02/14/2015   Irregular menses 05/21/2013   Migraines    Obesity    Osteopenia 2021   Other and unspecified ovarian cyst 05/31/2013   Right complex ?cystic vs solid mass on ovary will schedule appt with [facility]   Pregnant 02/14/2015   Primary biliary cholangitis (HCC)    Thyroid disease    Reported Hashimoto's Thyroiditis in past     Past Surgical History:  Procedure Laterality Date   BIOPSY  04/17/2018   Procedure: BIOPSY;  Surgeon: Malissa Hippo, MD;  [facility];  Service: Endoscopy;;  colon   BIOPSY  05/05/2020   Procedure: BIOPSY;  Surgeon: Dolores Frame, MD;  [facility];  Service: Gastroenterology;;   CESAREAN SECTION     COLONOSCOPY     COLONOSCOPY WITH PROPOFOL N/A 04/17/2018   Procedure: COLONOSCOPY WITH PROPOFOL;  Surgeon: Malissa Hippo, MD;  [facility];  Service: Endoscopy;  Laterality: N/A;  10:50   COLONOSCOPY WITH PROPOFOL N/A 05/05/2020   Procedure: COLONOSCOPY WITH PROPOFOL;  Surgeon: Dolores Frame, MD;  [facility];  Service: Gastroenterology;  Laterality: N/A;  AM   LIVER BIOPSY N/A 08/17/2018   Procedure: LIVER BIOPSY;  Surgeon: Lucretia Roers, MD;  Location: [facility];  Service: General;  Laterality: N/A;   OOPHORECTOMY Right 10/2013   TONSILLECTOMY     TUBAL LIGATION Bilateral 08/17/2018   Procedure: LAPAROSCOPIC BILATERAL TUBAL LIGATION WITH FALLOPE RINGS;  Surgeon: Tilda Burrow, MD;  Location: [facility];  Service: Gynecology;  Laterality: Bilateral;   VENTRAL HERNIA REPAIR N/A 08/17/2018   Procedure: LAPAROSCOPIC VENTRAL HERNIA WITH MESH;  Surgeon: Lucretia Roers, MD;  Location: [facility];  Service: General;  Laterality: N/A;   WISDOM TOOTH EXTRACTION     Family History  Problem  Relation Age of Onset   Diabetes Mother    Hypertension Mother    Heart attack Mother    Stroke Mother    Seizures Father    Other Father        back problems   Diabetes Maternal Grandmother    Hypertension Maternal Grandmother    Cancer Maternal Grandmother        breast   Diabetes Maternal Grandfather    Hypertension Maternal Grandfather    Leukemia Maternal Grandfather    AAA (abdominal aortic aneurysm) Maternal Grandfather    Dementia Paternal Grandmother    Kidney disease Paternal Grandfather    Cancer Maternal Aunt    Diabetes Maternal Uncle    Colon cancer Cousin  Ovarian cancer Cousin    Stomach cancer Other    Ulcerative colitis Neg Hx    Crohn's disease Neg Hx    Social History   Tobacco Use   Smoking status: Former    Current packs/day: 0.00    Average packs/day: 0.3 packs/day for 8.0 years (2.0 ttl pk-yrs)    Types: Cigarettes    Start date: 10/07/2002    Quit date: 10/07/2010    Years since quitting: 11.8   Smokeless tobacco: Never  Vaping Use   Vaping status: Never Used  Substance Use Topics   Alcohol use: Yes    Comment: rare   Drug use: No   Current Outpatient Medications  Medication Sig Dispense Refill   Calcium Carbonate-Vit D-Min (CALCIUM 1200) 1200-1000 MG-UNIT CHEW Chew 1 tablet by mouth daily. 30 tablet 0   Cholecalciferol (VITAMIN D3) 125 MCG (5000 UT) CAPS Take 1 capsule (5,000 Units total) by mouth daily. 30 capsule 0   dicyclomine (BENTYL) 10 MG capsule Take 1 capsule (10 mg total) by mouth every 12 (twelve) hours as needed for spasms. 60 capsule 1   levothyroxine (SYNTHROID) 137 MCG tablet Take 137 mcg by mouth daily.     OVER THE COUNTER MEDICATION Take 200 mg by mouth daily. Magnesium     ursodiol (ACTIGALL) 300 MG capsule Take 2 capsules (600 mg total) by mouth 3 (three) times daily. 180 capsule 5   vedolizumab (ENTYVIO) 300 MG injection      budesonide (ENTOCORT EC) 3 MG 24 hr capsule Take 3 capsules (9 mg total) by mouth daily. For  4 weeks (Patient not taking: Reported on 08/27/2022) 84 capsule 0   calcium-vitamin D (OSCAL WITH D) 500-200 MG-UNIT tablet Take 1 tablet by mouth daily with breakfast. (Patient not taking: Reported on 08/27/2022) 90 tablet 1   No current facility-administered medications for this visit.   Allergies  Allergen Reactions   Imitrex [Sumatriptan]     Makes migraines worse.    Maxalt [Rizatriptan]     Makes migraines worse   Phenergan [Promethazine Hcl] Nausea And Vomiting    Pill form; IV ok   Tape Other (See Comments)    Blisters on abdomen after C Section     Review of Systems: All systems reviewed and negative except where noted in HPI.   Lab Results  Component Value Date   WBC 10.4 06/03/2022   HGB 13.4 06/03/2022   HCT 40.6 06/03/2022   MCV 86.0 06/03/2022   PLT 619.0 (H) 06/03/2022    Lab Results  Component Value Date   NA 137 06/03/2022   CL 103 06/03/2022   K 3.8 06/03/2022   CO2 24 06/03/2022   BUN 8 06/03/2022   CREATININE 0.65 06/03/2022   GFR 114.87 06/03/2022   CALCIUM 8.9 06/03/2022   ALBUMIN 4.0 06/03/2022   GLUCOSE 86 06/03/2022    Lab Results  Component Value Date   ALT 29 06/03/2022   AST 36 06/03/2022   ALKPHOS 261 (H) 06/03/2022   BILITOT 0.7 06/03/2022     Physical Exam: BP 104/70 (BP Location: Left Arm, Patient Position: Sitting)   Pulse 72   Ht 5\' 4"  (1.626 m)   Wt 275 lb 6 oz (124.9 kg)   SpO2 98%   BMI 47.27 kg/m  Constitutional: Pleasant,well-developed, female in no acute distress. Neurological: Alert and oriented to person place and time. Psychiatric: Normal mood and affect. Behavior is normal.   ASSESSMENT: 37 y.o. female here for assessment of the following  1. Crohn's disease of both small and large intestine with other complication (HCC)   2. Elevated liver enzymes   3. Osteopenia, unspecified location   4. Vitamin D deficiency    History as outlined above.  Paradoxical worsening with Lialda, has been on no therapy for  some time.  No anemia and her labs look okay, however fecal calprotectin was markedly elevated.  We started her on Entyvio since the last visit and she has completed the first 3 doses and she has definitely found some symptomatic benefit from this although her symptoms are not entirely resolved.  I think she may do better with higher dosing of Entyvio given she still has some mild symptoms.  I would like to switch her to every 4-week dosing of her insurance covers it, I will touch base with the infusion center.  If she has worsening in the interim she may need some steroids.  If Entyvio ultimately does not work to control her we will need to consider other options, which we discussed briefly today.  It is too early to consider an Entyvio failure however and will see how she does over the next few months.  I would like to do another fecal calprotectin in a few months to see how she is doing.  Ultimately we may need to do a colonoscopy as well to assess more objectively if considering change in therapy based on her course.  Reviewed her hepatology consult note with her.  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  MRCP pending to assess further for PSC.  This was ordered by hepatology and will await that result.  IgG4 level normal.  Continuing ursodiol for now, LFTs stable.  Elastography shows no significant fibrotic change which is good.  She does have osteopenia and severe vitamin D deficiency.  Now on Os-Cal and have recommended vitamin D 5000 units/day and we will repeat that lab in a few months.  She agrees  PLAN: - continue Entyvio - switch to q 4 week dosing if possible - may need steroids and switch in regimen if worsening over time - f/u 4 months or sooner with issues. Will do fecal calprotectin at that time - increase vitamin D to 5000 international units per day, continue oscal - await results of MRCP and follow up with hepatology - continue ursodiol  Harlin Rain,  MD [facility]

## 2022-09-03 ENCOUNTER — Telehealth: Payer: Self-pay | Admitting: Pharmacy Technician

## 2022-09-03 NOTE — Telephone Encounter (Signed)
Auth Submission: APPROVED Site of care: Site of care: [facility] Payer: BCBS Medication & CPT/J Code(s) submitted: Entyvio (Vedolizumab) [id number] Route of submission (phone, fax, portal):  Phone # Fax # Auth type: Buy/Bill Units/visits requested: 300MG  Q4WKS 5 DOSES Reference number: [id number] Approval from: 09/02/22 to 12/23/22

## 2022-09-12 ENCOUNTER — Ambulatory Visit (INDEPENDENT_AMBULATORY_CARE_PROVIDER_SITE_OTHER): Payer: BC Managed Care – PPO

## 2022-09-12 VITALS — BP 107/75 | HR 78 | Temp 97.5°F | Resp 17 | Ht 64.0 in | Wt 273.0 lb

## 2022-09-12 DIAGNOSIS — K50818 Crohn's disease of both small and large intestine with other complication: Secondary | ICD-10-CM | POA: Diagnosis not present

## 2022-09-12 MED ORDER — VEDOLIZUMAB 300 MG IV SOLR
300.0000 mg | Freq: Once | INTRAVENOUS | Status: AC
  Administered 2022-09-12: 300 mg via INTRAVENOUS
  Filled 2022-09-12: qty 5

## 2022-09-12 NOTE — Progress Notes (Signed)
Diagnosis: Crohn's Disease  Provider:  Chilton Greathouse MD  Procedure: IV Infusion  IV Type: Peripheral, IV Location: R Forearm  Entyvio (Vedolizumab), Dose: 300 mg  Infusion Start Time: 1326  Infusion Stop Time: 1402  Post Infusion IV Care: Peripheral IV Discontinued  Discharge: Condition: Good, Destination: Home . AVS Declined  Performed by:  Garnette Czech, RN

## 2022-09-20 ENCOUNTER — Ambulatory Visit (HOSPITAL_COMMUNITY)
Admission: RE | Admit: 2022-09-20 | Discharge: 2022-09-20 | Disposition: A | Discharge status: Home / Self Care | Payer: BC Managed Care – PPO | Source: Ambulatory Visit | Attending: Nurse Practitioner | Admitting: Nurse Practitioner

## 2022-09-20 DIAGNOSIS — K743 Primary biliary cirrhosis: Secondary | ICD-10-CM | POA: Insufficient documentation

## 2022-09-20 DIAGNOSIS — R7989 Other specified abnormal findings of blood chemistry: Secondary | ICD-10-CM | POA: Diagnosis present

## 2022-09-20 DIAGNOSIS — K7401 Hepatic fibrosis, early fibrosis: Secondary | ICD-10-CM | POA: Diagnosis present

## 2022-10-03 ENCOUNTER — Ambulatory Visit: Payer: BC Managed Care – PPO

## 2022-10-04 ENCOUNTER — Other Ambulatory Visit: Payer: BC Managed Care – PPO

## 2022-10-10 ENCOUNTER — Ambulatory Visit (INDEPENDENT_AMBULATORY_CARE_PROVIDER_SITE_OTHER): Payer: BC Managed Care – PPO | Admitting: *Deleted

## 2022-10-10 VITALS — BP 101/71 | HR 70 | Temp 98.0°F | Resp 18 | Ht 64.0 in | Wt 280.2 lb

## 2022-10-10 DIAGNOSIS — K50818 Crohn's disease of both small and large intestine with other complication: Secondary | ICD-10-CM

## 2022-10-10 MED ORDER — VEDOLIZUMAB 300 MG IV SOLR
300.0000 mg | Freq: Once | INTRAVENOUS | Status: AC
  Administered 2022-10-10: 300 mg via INTRAVENOUS
  Filled 2022-10-10: qty 5

## 2022-10-10 NOTE — Progress Notes (Signed)
Diagnosis: Crohn's Disease  Provider:  Chilton Greathouse MD  Procedure: IV Infusion  IV Type: Peripheral, IV Location: R Forearm  Entyvio (Vedolizumab), Dose: 300 mg  Infusion Start Time: 1321 pm  Infusion Stop Time: 1400 pm  Post Infusion IV Care: Observation period completed and Peripheral IV Discontinued  Discharge: Condition: Good, Destination: Home . AVS Declined  Performed by:  Forrest Moron, RN

## 2022-10-11 ENCOUNTER — Telehealth: Payer: Self-pay

## 2022-10-11 NOTE — Telephone Encounter (Signed)
Auth Submission: APPROVED Site of care: Site of care: [facility] Payer: BCBS Medication & CPT/J Code(s) submitted: Entyvio (Vedolizumab) [id number] Route of submission (phone, fax, portal): fax Phone # Fax # Auth type: Buy/Bill PB Units/visits requested: 5 visits, 300mg , q4weeks Reference number: [id number] Approval from: 09/02/22 to 12/23/22

## 2022-11-02 ENCOUNTER — Encounter: Payer: Self-pay | Admitting: Gastroenterology

## 2022-11-02 DIAGNOSIS — K50818 Crohn's disease of both small and large intestine with other complication: Secondary | ICD-10-CM

## 2022-11-05 NOTE — Telephone Encounter (Signed)
Armbruster, Willaim Rayas, MD  You1 hour ago (12:29 PM)   Sharol Harness can you order her the following at our lab - fecal calprotectin, CBC, CMET.  I will send her a message with my recommendations otherwise. Thanks  Fecal calprotectin order in epic.  Lab orders in epic.

## 2022-11-07 ENCOUNTER — Other Ambulatory Visit: Payer: BC Managed Care – PPO

## 2022-11-07 ENCOUNTER — Encounter: Payer: BC Managed Care – PPO | Attending: Gastroenterology | Admitting: Internal Medicine

## 2022-11-07 ENCOUNTER — Ambulatory Visit: Payer: BC Managed Care – PPO

## 2022-11-07 VITALS — BP 95/68 | HR 82 | Temp 98.4°F | Resp 16

## 2022-11-07 DIAGNOSIS — K50818 Crohn's disease of both small and large intestine with other complication: Secondary | ICD-10-CM

## 2022-11-07 LAB — CBC WITH DIFFERENTIAL/PLATELET
Basophils Absolute: 0.1 10*3/uL (ref 0.0–0.1)
Basophils Relative: 1.1 % (ref 0.0–3.0)
Eosinophils Absolute: 0.2 10*3/uL (ref 0.0–0.7)
Eosinophils Relative: 1.9 % (ref 0.0–5.0)
HCT: 38.9 % (ref 36.0–46.0)
Hemoglobin: 12.6 g/dL (ref 12.0–15.0)
Lymphocytes Relative: 39.4 % (ref 12.0–46.0)
Lymphs Abs: 5 10*3/uL — ABNORMAL HIGH (ref 0.7–4.0)
MCHC: 32.3 g/dL (ref 30.0–36.0)
MCV: 86 fl (ref 78.0–100.0)
Monocytes Absolute: 0.7 10*3/uL (ref 0.1–1.0)
Monocytes Relative: 5.4 % (ref 3.0–12.0)
Neutro Abs: 6.7 10*3/uL (ref 1.4–7.7)
Neutrophils Relative %: 52.2 % (ref 43.0–77.0)
Platelets: 716 10*3/uL — ABNORMAL HIGH (ref 150.0–400.0)
RBC: 4.52 Mil/uL (ref 3.87–5.11)
RDW: 14.4 % (ref 11.5–15.5)
WBC: 12.8 10*3/uL — ABNORMAL HIGH (ref 4.0–10.5)

## 2022-11-07 LAB — COMPREHENSIVE METABOLIC PANEL
ALT: 16 U/L (ref 0–35)
AST: 19 U/L (ref 0–37)
Albumin: 3.5 g/dL (ref 3.5–5.2)
Alkaline Phosphatase: 188 U/L — ABNORMAL HIGH (ref 39–117)
BUN: 4 mg/dL — ABNORMAL LOW (ref 6–23)
CO2: 25 mEq/L (ref 19–32)
Calcium: 8.4 mg/dL (ref 8.4–10.5)
Chloride: 106 mEq/L (ref 96–112)
Creatinine, Ser: 0.67 mg/dL (ref 0.40–1.20)
GFR: 113.69 mL/min (ref >60.00)
Glucose, Bld: 87 mg/dL (ref 70–99)
Potassium: 3.6 mEq/L (ref 3.5–5.1)
Sodium: 138 mEq/L (ref 135–145)
Total Bilirubin: 0.6 mg/dL (ref 0.2–1.2)
Total Protein: 7.1 g/dL (ref 6.0–8.3)

## 2022-11-07 MED ORDER — VEDOLIZUMAB 300 MG IV SOLR
300.0000 mg | Freq: Once | INTRAVENOUS | Status: AC
  Administered 2022-11-07: 300 mg via INTRAVENOUS
  Filled 2022-11-07: qty 5

## 2022-11-07 NOTE — Progress Notes (Signed)
Diagnosis: Crohn's Disease  Provider:   Ileene Patrick  Procedure: IV Infusion  IV Type: Peripheral, IV Location: L Antecubital  Entyvio (Vedolizumab), Dose: 300 mg  Infusion Start Time: 1058  Infusion Stop Time: 1131  Post Infusion IV Care: Observation period completed  Discharge: Condition: Good, Destination: Home . AVS Provided  Performed by:  Feliberto Harts, LPN

## 2022-11-08 ENCOUNTER — Other Ambulatory Visit: Payer: BC Managed Care – PPO

## 2022-11-08 DIAGNOSIS — K50818 Crohn's disease of both small and large intestine with other complication: Secondary | ICD-10-CM

## 2022-11-11 LAB — CALPROTECTIN, FECAL: Calprotectin, Fecal: 2680 ug/g — ABNORMAL HIGH (ref 0–120)

## 2022-11-13 ENCOUNTER — Other Ambulatory Visit: Payer: Self-pay | Admitting: *Deleted

## 2022-11-13 MED ORDER — PREDNISONE 10 MG PO TABS: ORAL_TABLET | ORAL | 0 refills | Status: DC

## 2022-11-18 ENCOUNTER — Ambulatory Visit (INDEPENDENT_AMBULATORY_CARE_PROVIDER_SITE_OTHER): Payer: BC Managed Care – PPO | Admitting: Gastroenterology

## 2022-11-18 ENCOUNTER — Encounter: Payer: Self-pay | Admitting: Gastroenterology

## 2022-11-18 VITALS — BP 118/76 | HR 88 | Ht 64.0 in | Wt 269.0 lb

## 2022-11-18 DIAGNOSIS — K50818 Crohn's disease of both small and large intestine with other complication: Secondary | ICD-10-CM

## 2022-11-18 DIAGNOSIS — K743 Primary biliary cirrhosis: Secondary | ICD-10-CM

## 2022-11-18 DIAGNOSIS — K760 Fatty (change of) liver, not elsewhere classified: Secondary | ICD-10-CM

## 2022-11-18 DIAGNOSIS — R197 Diarrhea, unspecified: Secondary | ICD-10-CM

## 2022-11-18 MED ORDER — PNEUMOCOCCAL 21-VAL CONJ VACC 0.5 ML IM SOSY
0.5000 mL | PREFILLED_SYRINGE | Freq: Once | INTRAMUSCULAR | 0 refills | Status: AC
Start: 2022-11-18 — End: 2022-11-18

## 2022-11-18 MED ORDER — ZOSTER VAC RECOMB ADJUVANTED 50 MCG/0.5ML IM SUSR: INTRAMUSCULAR | 0 refills | Status: DC

## 2022-11-18 NOTE — Progress Notes (Signed)
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     Recap of major GI issues:   Liver disease: she has had intermittent elevations in her liver enzymes for years.  Denies any family history of liver disease or cirrhosis.  She states this dates back to at least 2012 with her pregnancy.  She does not drink any alcohol routinely.  She had a serologic workup in 2020 and ultimately a liver biopsy.  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  Stage I fibrosis noted at that time. She had positive ASMA but her IgG levels were normal. She was started on ursodiol.  Prescribed 900 mg twice daily (based on weight, 13 to 15 mg/kg). She has had a history of osteopenia on remote DEXA scan that was done in 2021. She also has a vitamin D deficiency.  Seen by [facility].  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  MRCP shows no evidence of PSC.  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   Crohn's disease: Back in 2020 she had an abnormal CT scan in February 2020 showing a suspected ascending colon mass with lymphadenopathy.  Also ventral wall hernia noted.  This led to a colonoscopy the following month which showed some inflammatory changes in the right colon and edema, biopsies were taken and were normal.   She states over time she has developed diarrhea with significant urgency and mucus and rectal bleeding.  This led to a follow-up colonoscopy with Dr. Levon Hedger in March 2022.  She had overt colitis and most of her colon, biopsies showed chronic active colitis.  She also had ileitis on biopsies although the ileum appeared normal.  Of note the rectum and sigmoid colon were normal on biopsies.  It appears she was started on Lialda to treat this initially.  She states she had paradoxical worsening on this regimen and her diarrhea and GI symptoms got much worse so she stopped it.  She states he  was not aware of any other treatment for her colitis, essentially has not been on anything since that time.  She had been placed on Entyvio in April 2024.  Fecal calprotectin markedly elevated in the 2000's.      SINCE LAST VISIT:   Patient here for follow-up visit for her Crohn's disease.  Recall back in April fecal calprotectin was markedly elevated into the 2000's.  She has completed loading dosing of Entyvio and on maintenance therapy.  She thought she was getting better for period of time and was doing okay.  She states for the past several weeks however she felt symptoms have come back and she is not doing well again.  She is having abdominal pains that bother her in association with urgent loose stools with increased frequency.  She states she has numerous bowel movements per day, perhaps once or twice an hour.  Stools are loose with urgency.  She previously was having some bleeding with this on a routine basis.  Her abdominal discomfort was previously severe and described as a "labor pain" but now more of a dull ache.  She called me and we repeated fecal calprotectin was again elevated into the 2000's.  I started her on prednisone last week at 40 mg daily.  She states this is definitely taken the edge off and she is feeling better but still is not back to baseline and still pretty "  miserable".  She denies any sick contacts.  She does work at a nursing home.  She has never had C. difficile before that she is aware of.  She has had QuantiFERON gold that has been negative this year.  She has had her flu shot recently, she has not had pneumonia vaccine or Shingrix.  We discussed other options.     In regards to her liver disease, she has seen [facility], Annamarie Major since our last visit.  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  MRCP negative.  She remains on ursodiol, LAEs show mildly elevated AP to 188, AST / ALT normal.   Prior workup: Celiac panel negative 04/27/20   Colonoscopy  05/05/20: - The examined portion of the ileum was normal. Biopsied. - Diffuse mild inflammation was found in the descending colon, in the transverse colon, in the ascending colon and in the cecum secondary to inflammatory bowel disease. Biopsied. - The distal rectum and anal verge are normal on retroflexion view.   FINAL MICROSCOPIC DIAGNOSIS:  A. TERMINAL ILEUM, BIOPSY: - Chronic mildly active ileitis. - No granuloma. - No dysplasia or malignancy.  B. COLON, CECAL, BIOPSY: - Chronic severally active colitis. - No granulomas. - No dysplasia or malignancy.  C. COLON, ASCENDING, BIOPSY: - Chronic moderately active colitis. - No granulomas. - No dysplasia or malignancy.  D. COLON, TRANSVERSE, BIOPSY: - Chronic moderately active colitis. - No granulomas. - No dysplasia or malignancy.  E. COLON, DESCENDING, BIOPSY: - Chronic moderately active colitis. - No granulomas. - No dysplasia or malignancy.  F. COLON, SIGMOID, BIOPSY: - Benign colonic mucosa. - No active inflammation. - No granulomas. - No dysplasia or malignancy.  G. RECTUM, BIOPSY: - Benign colonic mucosa. - No active inflammation. - No granulomas. - No dysplasia or malignancy.  COMMENT:  A-E. The findings are consistent with inflammatory bowel disease and the features favor Crohn's.     Colonoscopy 04/17/18: - The examined portion of the ileum was normal. - Congested mucosa in the ascending colon and in the cecum. Biopsied. - External hemorrhoids.  Colon, biopsy, cecal and ascending - BENIGN COLONIC MUCOSA WITH UNDERLYING LYMPHOID AGGREGATES. - NO DYSPLASIA OR MALIGNANCY.     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 The biopsies show multiple portal-based granulomas, some with typical 'florid duct lesions'. Similar to overall inflammation, there is patchy but significant bile duct injury including changes consistent with patchy ductopenia. There is associated mild ductular reaction and chronic cholestasis (confirmed with immunohistochemical stains for CK7). Many of the portal tracts also show onion skin-like periductal fibrosis and focal bile duct scars. The trichrome and reticulin stains show patchy, mild portal fibrosis (please note that the degree of fibrosis can be sometimes slightly exaggerated due to subcapsular nature of the biopsy). Patient's elevated serum alkaline phosphatase levels and negative anti-mitochondrial antibody titer are noted. 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 Differential diagnosis can include sarcoidosis and primary sclerosing cholangitis (small duct type) but appear to be less  likely. Rarely, drugs can cause biliary tract patterns of injury with granulomas. PASD stain is negative for intracytoplasmic globules. Iron stain is negative for stainable iron.     Fecal calprotectin 06/06/22: 2850   QG negative Hep B surface antigen and hep B core AB negative AMA negative GGT 103 Vit D - 12.77 CRP 12    RUQ [place] elastography 06/18/22: IMPRESSION: ULTRASOUND RUQ: Increased parenchymal echogenicity, suggesting hepatic steatosis. ULTRASOUND HEPATIC ELASTOGRAPHY: Median kPa:  3.3 Diagnostic category:  < or = 5 kPa: high probability of being normal   IgG4 45   MRCP 09/22/2022: IMPRESSION: 1. No MRI evidence of hepatic fibrosis. 2. Visualized  portions of the colon demonstrate loss of haustration and some submucosal fibrofatty deposition, nonspecific but can be seen in the setting of chronic inflammatory bowel disease.      Fecal calprotectin 06/06/2022 - 2850  Fecal calprotectin 11/08/22 - 2680  Past Medical History:  Diagnosis Date   Breast nodule 10/10/2014   Breast pain 10/10/2014   BV (bacterial vaginosis) 11/12/2012   Crohn's disease (HCC)    Depression    Fatty liver    Hashimoto's disease    Hypothyroid 02/14/2015   Irregular menses 05/21/2013   Migraines    Obesity    Osteopenia 2021   Other and unspecified ovarian cyst 05/31/2013   Right complex ?cystic vs solid mass on ovary will schedule appt with [facility]   Pregnant 02/14/2015   Primary biliary cholangitis (HCC)    Thyroid disease    Reported Hashimoto's Thyroiditis in past     Past Surgical History:  Procedure Laterality Date   BIOPSY  04/17/2018   Procedure: BIOPSY;  Surgeon: Malissa Hippo, MD;  Location: [facility];  Service: Endoscopy;;  colon   BIOPSY  05/05/2020   Procedure: BIOPSY;  Surgeon: Dolores Frame, MD;  Location: [facility];  Service: Gastroenterology;;   CESAREAN SECTION     COLONOSCOPY     COLONOSCOPY WITH PROPOFOL N/A 04/17/2018   Procedure: COLONOSCOPY WITH PROPOFOL;  Surgeon: Malissa Hippo, MD;  [facility]: [facility];  Service: Endoscopy;  Laterality: N/A;  10:50   COLONOSCOPY WITH PROPOFOL N/A 05/05/2020   Procedure: COLONOSCOPY WITH PROPOFOL;  Surgeon: Dolores Frame, MD;  Location: [facility];  Service: Gastroenterology;  Laterality: N/A;  AM   LIVER BIOPSY N/A 08/17/2018   Procedure: LIVER BIOPSY;  Surgeon: Lucretia Roers, MD;  Location: [facility];  Service: General;  Laterality: N/A;   OOPHORECTOMY Right 10/2013   TONSILLECTOMY     TUBAL LIGATION Bilateral 08/17/2018   Procedure: LAPAROSCOPIC BILATERAL TUBAL LIGATION WITH FALLOPE RINGS;  Surgeon: Tilda Burrow, MD;  Location: [facility];  Service: Gynecology;  Laterality: Bilateral;   VENTRAL HERNIA REPAIR N/A 08/17/2018   Procedure: LAPAROSCOPIC VENTRAL HERNIA WITH MESH;  Surgeon: Lucretia Roers, MD;  Location: [facility];  Service: General;  Laterality: N/A;   WISDOM TOOTH EXTRACTION     Family History  Problem Relation Age of Onset   Diabetes Mother    Hypertension Mother    Heart attack Mother    Stroke Mother    Seizures Father    Other Father        back problems   Diabetes Maternal Grandmother    Hypertension Maternal Grandmother    Cancer Maternal Grandmother        breast   Diabetes Maternal Grandfather    Hypertension Maternal Grandfather    Leukemia Maternal Grandfather  AAA (abdominal aortic aneurysm) Maternal Grandfather    Dementia Paternal Grandmother    Kidney disease Paternal Grandfather    Cancer Maternal Aunt    Diabetes Maternal Uncle    Colon cancer Cousin    Ovarian cancer Cousin    Stomach cancer Other    Ulcerative colitis Neg Hx    Crohn's disease Neg Hx    Social History   Tobacco Use   Smoking status: Former    Current packs/day: 0.00    Average packs/day: 0.3 packs/day for 8.0 years (2.0 ttl pk-yrs)    Types: Cigarettes    Start date: 10/07/2002    Quit date: 10/07/2010    Years since quitting: 12.1   Smokeless tobacco: Never  Vaping Use   Vaping status: Never Used  Substance Use Topics   Alcohol use: Yes    Comment: rare   Drug use: No   Current Outpatient Medications  Medication Sig Dispense Refill   Vitamin D, Ergocalciferol, (DRISDOL) 1.25 MG (50000 UNIT) CAPS capsule Take 50,000 Units by mouth every 7 (seven) days.     Calcium Carbonate-Vit D-Min (CALCIUM 1200) 1200-1000 MG-UNIT CHEW Chew 1 tablet by mouth daily. 30 tablet 0   calcium-vitamin D (OSCAL WITH D) 500-200 MG-UNIT tablet Take 1 tablet by mouth daily with breakfast. (Patient not taking: Reported on 08/27/2022) 90 tablet 1   Cholecalciferol (VITAMIN D3) 125 MCG (5000 UT) CAPS Take 1 capsule (5,000  Units total) by mouth daily. 30 capsule 0   dicyclomine (BENTYL) 10 MG capsule Take 1 capsule (10 mg total) by mouth every 12 (twelve) hours as needed for spasms. 60 capsule 1   levothyroxine (SYNTHROID) 137 MCG tablet Take 150 mcg by mouth daily.     OVER THE COUNTER MEDICATION Take 200 mg by mouth daily. Magnesium     predniSONE (DELTASONE) 10 MG tablet Take 4 tabs (40 mg) by mouth once daily x 14 days, then take 3.5 tab (35 mg) by mouth daily x 7 days, then take 3 tab (30 mg) daily x 7 days, then take 2.5 tab (25 mg) daily x 7 days, then take 2 tab (20 mg) daily x 7 days, then take 1.5 tab (15 mg) daily x 7 days, then take 1 tab (10 mg) daily x 7 days, then take 0.5 tab (5 mg) daily x 7 days, then discontinue 154 tablet 0   ursodiol (ACTIGALL) 300 MG capsule Take 2 capsules (600 mg total) by mouth 3 (three) times daily. 180 capsule 5   vedolizumab (ENTYVIO) 300 MG injection      No current facility-administered medications for this visit.   Allergies  Allergen Reactions   Imitrex [Sumatriptan]     Makes migraines worse.    Maxalt [Rizatriptan]     Makes migraines worse   Phenergan [Promethazine Hcl] Nausea And Vomiting    Pill form; IV ok   Tape Other (See Comments)    Blisters on abdomen after C Section     Review of Systems: All systems reviewed and negative except where noted in HPI.   Lab Results  Component Value Date   WBC 12.8 (H) 11/07/2022   HGB 12.6 11/07/2022   HCT 38.9 11/07/2022   MCV 86.0 11/07/2022   PLT 716.0 (H) 11/07/2022    Lab Results  Component Value Date   NA 138 11/07/2022   CL 106 11/07/2022   K 3.6 11/07/2022   CO2 25 11/07/2022   BUN 4 (L) 11/07/2022   CREATININE 0.67 11/07/2022  GFR 113.69 11/07/2022   CALCIUM 8.4 11/07/2022   ALBUMIN 3.5 11/07/2022   GLUCOSE 87 11/07/2022    Lab Results  Component Value Date   ALT 16 11/07/2022   AST 19 11/07/2022   ALKPHOS 188 (H) 11/07/2022   BILITOT 0.6 11/07/2022     Physical Exam: BP  118/76   Pulse 88   Ht 5\' 4"  (1.626 m)   Wt 269 lb (122 kg)   BMI 46.17 kg/m  Constitutional: Pleasant,well-developed, female in no acute distress. Neurological: Alert and oriented to person place and time. Psychiatric: Normal mood and affect. Behavior is normal.   ASSESSMENT: 37 y.o. female here for assessment of the following  1. Crohn's disease of both small and large intestine with other complication (HCC)   2. Diarrhea, unspecified type   3. Primary biliary cholangitis (HCC)   4. Metabolic dysfunction-associated steatotic liver disease (MASLD)    Crohn's disease appears to be uncontrolled.  Fecal calprotectin has remained elevated with symptoms.  Initially we tried her on Entyvio, she got better for short period of time but ultimately breaking through this despite every 4-week dosing.  I think she ultimately needs to be transition to a different biologic therapy and we discussed several options.  Today we sent a rectal swab for C. difficile and GI pathogen panel to ensure we are not missing that as a cause for her breakthrough symptoms.  Assuming that is negative and her current symptoms are a result of her Crohn's disease, following discussion of options we will try to get her on Skyrizi which I think is an excellent next choice.  We discussed risks and benefits of the regimen.  Hopefully her insurance will cover this.  I recommend she be immunized to pneumonia with PCV 21 vaccine, as well as Shingrix vaccine.  Prescription given for those.  In the interim we will continue prednisone taper, total of 40 mg daily for 2 weeks then slow taper by 5 mg grams per week until done.  If we can get her on Skyrizi in the near future we will taper her more quickly.  She has been established with hepatology, on ursodiol.  She is going to follow-up with them in a few months for reassessment.  I defer to them on adding additional drugs to her regimen if alk phos remains elevated in light of PBC.   Continue ursodiol for now.  She agrees, she has follow-up with [place] in 1 month for reassessment.  I told her to call us in the interim if she is not doing well despite the prednisone and we need to make adjustments sooner.  She agrees  PLAN: - Diatherix rectal swab for C diff and GI pathogen panel done today in office per Lucius Conn CMA - continue prednisone taper - 40mg  / day for 2 weeks and then taper by 5mg  / week until done - will send orders for [facility] assuming negative infectious workup - prescription for PCV21 vaccine and Shingrix - continue Ursodiol - follow up with Hepatology January - f/u next month or sooner with issues  Harlin Rain, MD [facility]

## 2022-11-18 NOTE — Patient Instructions (Addendum)
.  _______________________________________________________  If your blood pressure at your visit was 140/90 or greater, please contact your primary care physician to follow up on this.  _______________________________________________________  If you are age 35 or older, your body mass index should be between 23-30. Your Body mass index is 46.17 kg/m. If this is out of the aforementioned range listed, please consider follow up with your Primary Care Provider.  If you are age 40 or younger, your body mass index should be between 19-25. Your Body mass index is 46.17 kg/m. If this is out of the aformentioned range listed, please consider follow up with your Primary Care Provider.   ________________________________________________________  The [facility] providers would like to encourage you to use [facility] to communicate with providers for non-urgent requests or questions.  Due to long hold times on the telephone, sending your provider a message by [facility] may be a faster and more efficient way to get a response.  Please allow 48 business hours for a response.  Please remember that this is for non-urgent requests.  _______________________________________________________   Your provider has ordered "Diatherix" stool testing for you. You have received a kit from our office today containing all necessary supplies to complete this test. Please carefully read the stool collection instructions provided in the kit before opening the accompanying materials. In addition, be sure to place the label from the top left corner of the laboratory request sheet onto the "puritan opti-swab" tube that is supplied in the kit. This label should include your full name and date of birth. After completing the test, you should secure the purtian tube into the specimen biohazard bag. The laboratory request information sheet (including date and time of specimen collection) should be placed into the outside pocket of the specimen  biohazard bag and returned to the [place] lab with 2 days of collection.    Continue your prednisone taper  We are giving you a script for PVC21 vaccine and the Shingrix vaccine today which you can take to your pharmacy to have these administered.  Thank you for entrusting me with your care and for choosing [facility], Dr. Ileene Patrick

## 2022-11-19 ENCOUNTER — Other Ambulatory Visit: Payer: Self-pay

## 2022-11-19 DIAGNOSIS — R197 Diarrhea, unspecified: Secondary | ICD-10-CM

## 2022-11-20 ENCOUNTER — Telehealth: Payer: Self-pay | Admitting: Gastroenterology

## 2022-11-20 MED ORDER — CIPROFLOXACIN HCL 500 MG PO TABS: 500.0000 mg | ORAL_TABLET | Freq: Two times a day (BID) | ORAL | 0 refills | Status: DC

## 2022-11-20 NOTE — Telephone Encounter (Signed)
[name] can you help relay the following to the patient:  - GI diatherix swab negative for C Diff - the test is positive for Salmonella infection. I think overall her Crohn's is probably causing her symptoms, as sometimes we see false positives for this stool test, but given the result and her symptoms, and that she is on Entyvio, I think we should for Salmonella to see if she gets better.  - Recommend ciprofloxacin 500mg  BID for one week - Let's see how she does with this. If still symptomatic next week she should let me know, I think likely place orders for Skyrizi at that time. Continue prednisone per her taper otherwise, that has seemed to help her somewhat so far.   Let me know if any questions. Thanks

## 2022-11-20 NOTE — Telephone Encounter (Signed)
Patient has been advised that stool testing did return +salmonella infection, however, we feel that her symptoms are more related to crohn's as we often get false positive salmonella test results. Advised that we will go ahead and treat her with cipro twice daily x 7 days out of caution. She is to let us know how she is doing in 1 week and if no better, we may need to look into changing some of her other medications. Patient verbalizes understanding of this. Rx sent to pharmacy.

## 2022-11-28 ENCOUNTER — Encounter: Payer: Self-pay | Admitting: Gastroenterology

## 2022-11-29 ENCOUNTER — Other Ambulatory Visit: Payer: Self-pay | Admitting: Gastroenterology

## 2022-12-02 ENCOUNTER — Telehealth: Payer: Self-pay

## 2022-12-02 NOTE — Telephone Encounter (Signed)
Hello,  Patient will be scheduled as soon as possible.  Auth Submission: APPROVED Site of care: Site of care: [facility] Payer: [place] [employer] Medication & CPT/J Code(s) submitted: Skyrizi [place]) [phone] Route of submission (phone, fax, portal): fax Phone # Fax # Auth type: Buy/Bill PB Units/visits requested: 600mg , q4weeks. 3 visits Reference number: [id number] Approval from: 12/02/22 to 02/24/23   Thank you

## 2022-12-02 NOTE — Telephone Encounter (Signed)
Excellent news! Thank you.

## 2022-12-05 ENCOUNTER — Encounter: Payer: BC Managed Care – PPO | Attending: Gastroenterology

## 2022-12-05 VITALS — BP 106/70 | HR 93 | Temp 98.6°F | Resp 18

## 2022-12-05 DIAGNOSIS — K50818 Crohn's disease of both small and large intestine with other complication: Secondary | ICD-10-CM | POA: Insufficient documentation

## 2022-12-05 MED ORDER — SODIUM CHLORIDE 0.9 % IV SOLN
600.0000 mg | Freq: Once | INTRAVENOUS | Status: AC
  Administered 2022-12-05: 600 mg via INTRAVENOUS
  Filled 2022-12-05: qty 10

## 2022-12-05 NOTE — Progress Notes (Signed)
Diagnosis: Crohn's Disease  Provider:   Ileene Patrick MD  Procedure: IV Infusion  IV Type: Peripheral, IV Location: R Antecubital  Skyrizi (risankizumab-rzaa), Dose: 600 mg  Infusion Start Time: 1024  Infusion Stop Time: 1128  Post Infusion IV Care: Observation period completed and Peripheral IV Discontinued  Discharge: Condition: Good, Destination: Home . AVS Provided  Performed by:  Arrie Senate, RN

## 2022-12-16 ENCOUNTER — Other Ambulatory Visit: Payer: Self-pay | Admitting: Gastroenterology

## 2022-12-16 NOTE — Telephone Encounter (Signed)
Jan thanks.  We can give her enough to complete prednisone taper - should be decreasing by 5mg  / week until done. I had placed orders for [facility] for her - can you let me know if she has started it or not? I think she has? If so should continue to taper off prednisone slowly. Thanks

## 2022-12-16 NOTE — Telephone Encounter (Signed)
Called and spoke to patient.  She has enough prednisone to finish her taper which has 4 more weeks, currently on 20 mg daily.  She is scheduled for Skyrizi infusion in 2 1/2 weeks on 11-21.  Should she finish the taper all the way to 5 mg which would continue for almost 2 weeks past when she starts Skyrizi?

## 2022-12-16 NOTE — Telephone Encounter (Signed)
Yes that's fine, thank you

## 2022-12-19 ENCOUNTER — Ambulatory Visit: Payer: BC Managed Care – PPO | Admitting: Gastroenterology

## 2023-01-02 ENCOUNTER — Encounter: Payer: BC Managed Care – PPO | Attending: Gastroenterology | Admitting: Emergency Medicine

## 2023-01-02 VITALS — BP 108/80 | HR 81 | Temp 98.4°F | Resp 18 | Ht 64.0 in | Wt 267.0 lb

## 2023-01-02 DIAGNOSIS — K50818 Crohn's disease of both small and large intestine with other complication: Secondary | ICD-10-CM | POA: Insufficient documentation

## 2023-01-02 MED ORDER — SODIUM CHLORIDE 0.9 % IV SOLN
600.0000 mg | Freq: Once | INTRAVENOUS | Status: AC
  Administered 2023-01-02: 600 mg via INTRAVENOUS
  Filled 2023-01-02: qty 10

## 2023-01-02 NOTE — Progress Notes (Signed)
Diagnosis: Crohn's Disease  Provider:  Ileene Patrick MD  Procedure: IV Infusion  IV Type: Peripheral, IV Location: L Antecubital  Skyrizi (risankizumab-rzaa), Dose: 600mg   Infusion Start Time: 1119  Infusion Stop Time: 1230  Post Infusion IV Care: Peripheral IV Discontinued  Discharge: Condition: Good, Destination: Home . AVS Provided  Performed by:  Arrie Senate, RN

## 2023-01-30 ENCOUNTER — Ambulatory Visit: Payer: BC Managed Care – PPO

## 2023-02-06 ENCOUNTER — Encounter: Payer: BC Managed Care – PPO | Attending: Gastroenterology | Admitting: Emergency Medicine

## 2023-02-06 VITALS — BP 108/79 | HR 81 | Temp 97.8°F | Resp 18

## 2023-02-06 DIAGNOSIS — K50818 Crohn's disease of both small and large intestine with other complication: Secondary | ICD-10-CM | POA: Insufficient documentation

## 2023-02-06 MED ORDER — SODIUM CHLORIDE 0.9 % IV SOLN
600.0000 mg | Freq: Once | INTRAVENOUS | Status: AC
  Administered 2023-02-06: 600 mg via INTRAVENOUS
  Filled 2023-02-06: qty 10

## 2023-02-06 NOTE — Progress Notes (Signed)
Diagnosis: Crohn's Disease  Provider:  Ileene Patrick MD  Procedure: IV Infusion  IV Type: Peripheral, IV Location: R Antecubital  Skyrizi (risankizumab-rzaa), Dose: 600 mg  Infusion Start Time: 1416  Infusion Stop Time: 1523  Post Infusion IV Care: Peripheral IV Discontinued  Discharge: Condition: Good, Destination: Home . AVS Provided  Performed by:  Arrie Senate, RN

## 2023-03-20 ENCOUNTER — Ambulatory Visit (INDEPENDENT_AMBULATORY_CARE_PROVIDER_SITE_OTHER): Payer: BC Managed Care – PPO | Admitting: Obstetrics & Gynecology

## 2023-03-20 ENCOUNTER — Other Ambulatory Visit (HOSPITAL_COMMUNITY)
Admission: RE | Admit: 2023-03-20 | Discharge: 2023-03-20 | Disposition: A | Discharge status: Home / Self Care | Payer: BC Managed Care – PPO | Source: Ambulatory Visit | Attending: Obstetrics & Gynecology | Admitting: Obstetrics & Gynecology

## 2023-03-20 ENCOUNTER — Encounter: Payer: Self-pay | Admitting: Obstetrics & Gynecology

## 2023-03-20 VITALS — BP 109/75 | HR 80 | Ht 64.0 in | Wt 285.4 lb

## 2023-03-20 DIAGNOSIS — R102 Pelvic and perineal pain unspecified side: Secondary | ICD-10-CM

## 2023-03-20 DIAGNOSIS — Z124 Encounter for screening for malignant neoplasm of cervix: Secondary | ICD-10-CM | POA: Insufficient documentation

## 2023-03-20 DIAGNOSIS — N946 Dysmenorrhea, unspecified: Secondary | ICD-10-CM | POA: Diagnosis not present

## 2023-03-20 DIAGNOSIS — K50818 Crohn's disease of both small and large intestine with other complication: Secondary | ICD-10-CM

## 2023-03-20 DIAGNOSIS — N809 Endometriosis, unspecified: Secondary | ICD-10-CM

## 2023-03-20 DIAGNOSIS — Z90721 Acquired absence of ovaries, unilateral: Secondary | ICD-10-CM

## 2023-03-20 DIAGNOSIS — Z6841 Body Mass Index (BMI) 40.0 and over, adult: Secondary | ICD-10-CM

## 2023-03-20 DIAGNOSIS — K529 Noninfective gastroenteritis and colitis, unspecified: Secondary | ICD-10-CM

## 2023-03-20 MED ORDER — NORELGESTROMIN-ETH ESTRADIOL 150-35 MCG/24HR TD PTWK: 1.0000 | MEDICATED_PATCH | TRANSDERMAL | 4 refills | Status: DC

## 2023-03-20 NOTE — Progress Notes (Signed)
 GYN VISIT Patient name: [name] MRN [id number]  Date of birth: 04/08/1985 Chief Complaint:   Menstrual Problem  History of Present Illness:   [name] is a 38 y.o. G64P3004 female being seen today for the following concerns:  AUB: Notes h/o endometrioma- s/p right oophorectomy.  Menses were regular for a while, but recently notes that now her periods have started to be every other month again. (Similar to when she had a cyst and required surgery)  In Dec- noted considerable right-sided pain before and during period as well as pain in her leg.  Menses uses to be about 3 days, but may vary from 1-7 days.  Also notes passage of quarter-sized clots.  +dysmenorrhea  Denies dyspareunia.  Notes h/o Crohn's struggles with diarrhea and a daily issue.    Recently started on Entyvio   Contraception: BTL in 2020 as part of ventral hernia repair  Prior Op note and US  reviewed- 2015 noted ~ 7cm right complex cyst.  Confirmed endometrioma by pathology.  No mention of significant scar tissue/endometriosis.  Patient's last menstrual period was 03/08/2023.    Review of Systems:   Pertinent items are noted in HPI Denies fever/chills, dizziness, headaches, visual disturbances, fatigue, shortness of breath, chest pain, abdominal pain, vomiting Pertinent History Reviewed:   Past Surgical History:  Procedure Laterality Date   BIOPSY  04/17/2018   Procedure: BIOPSY;  Surgeon: Golda Claudis PENNER, MD;  [facility];  Service: Endoscopy;;  colon   BIOPSY  05/05/2020   Procedure: BIOPSY;  Surgeon: Eartha Angelia Sieving, MD;  [facility]: [facility];  Service: Gastroenterology;;   CESAREAN SECTION     COLONOSCOPY     COLONOSCOPY WITH PROPOFOL  N/A 04/17/2018   Procedure: COLONOSCOPY WITH PROPOFOL ;  Surgeon: Golda Claudis PENNER, MD;  [facility]: [facility];  Service: Endoscopy;  Laterality: N/A;  10:50   COLONOSCOPY WITH PROPOFOL  N/A 05/05/2020   Procedure: COLONOSCOPY WITH  PROPOFOL ;  Surgeon: Eartha Angelia Sieving, MD;  [facility]: [facility];  Service: Gastroenterology;  Laterality: N/A;  AM   LIVER BIOPSY N/A 08/17/2018   Procedure: LIVER BIOPSY;  Surgeon: Kallie Manuelita BROCKS, MD;  Location: [facility];  Service: General;  Laterality: N/A;   OOPHORECTOMY Right 10/2013   TONSILLECTOMY     TUBAL LIGATION Bilateral 08/17/2018   Procedure: LAPAROSCOPIC BILATERAL TUBAL LIGATION WITH FALLOPE RINGS;  Surgeon: Edsel Norleen GAILS, MD;  Location: [facility];  Service: Gynecology;  Laterality: Bilateral;   VENTRAL HERNIA REPAIR N/A 08/17/2018   Procedure: LAPAROSCOPIC VENTRAL HERNIA WITH MESH;  Surgeon: Kallie Manuelita BROCKS, MD;  Location: [facility];  Service: General;  Laterality: N/A;   WISDOM TOOTH EXTRACTION      Past Medical History:  Diagnosis Date   Breast nodule 10/10/2014   Breast pain 10/10/2014   BV (bacterial vaginosis) 11/12/2012   Crohn's disease (HCC)    Depression    Fatty liver    Hashimoto's disease    Hypothyroid 02/14/2015   Irregular menses 05/21/2013   Migraines    Obesity    Osteopenia 2021   Other and unspecified ovarian cyst 05/31/2013   Right complex ?cystic vs solid mass on ovary will schedule appt with [facility]   Pregnant 02/14/2015   Primary biliary cholangitis (HCC)    Thyroid  disease    Reported Hashimoto's Thyroiditis in past   Reviewed problem list, medications and allergies. Physical Assessment:   Vitals:   03/20/23 1442  BP: 109/75  Pulse: 80  Weight: 285  lb 6.4 oz (129.5 kg)  Height: 5' 4 (1.626 m)  Body mass index is 48.99 kg/m.       Physical Examination:   General appearance: alert, well appearing, and in no distress  Psych: mood appropriate, normal affect  Skin: warm & dry   Cardiovascular: normal heart rate noted  Respiratory: normal respiratory effort, no distress  Abdomen: obese, soft, no rebound, no guarding, minimal diffuse discomfort in lower abdomen  Pelvic: VULVA: normal appearing vulva with no masses,  tenderness or lesions, VAGINA: normal appearing vagina with normal color and discharge, no lesions, CERVIX: normal appearing cervix without discharge or lesions, UTERUS: uterus is normal size, shape, consistency and nontender, ADNEXA: no masses or tenderness appreciated- exam limited due to body habitus  Extremities: no edema   Chaperone: Alan Fischer    Assessment & Plan:  1) Pelvic pain with h/o endometriosis -Discussed underlying etiology and reviewed that it may be likely of a recurrence of an endometrioma on her left -Unfortunately due to her GI history, patient cannot take NSAIDs -Recommendation for hormonal management of her menses and dysmenorrhea OCP risk assessment: Pt denies personal history of VTE, stroke or heart attack.  Denies personal h/o breast cancer.  Pt is either a non-smoker or smoker under the age of 36yo.  Denies h/o migraines with aura  -reviewed all options including pills, patch, ring, Depot or LARCs -risk/benefit and potential side effects of each were reviewed -questions and concerns were addressed, pt desires to proceed with patch -Plan to follow-up in 3 months for an ultrasound and medical management  2) Preventive screening -pap collected reviewed ASCCP guidelines  Meds ordered this encounter  Medications   norelgestromin -ethinyl estradiol  (XULANE) 150-35 MCG/24HR transdermal patch    Sig: Place 1 patch onto the skin once a week.    Dispense:  9 patch    Refill:  4      Orders Placed This Encounter  Procedures   US  PELVIC COMPLETE WITH TRANSVAGINAL    Return in about 3 months (around 06/17/2023) for medication follow up and pelvic US .   Winford Hehn, DO Attending Obstetrician & Gynecologist, [facility], [facility]

## 2023-03-28 ENCOUNTER — Encounter: Payer: Self-pay | Admitting: Obstetrics & Gynecology

## 2023-03-28 LAB — CYTOLOGY - PAP
Comment: NEGATIVE
Diagnosis: NEGATIVE
High risk HPV: NEGATIVE

## 2023-04-01 ENCOUNTER — Ambulatory Visit (INDEPENDENT_AMBULATORY_CARE_PROVIDER_SITE_OTHER): Payer: BC Managed Care – PPO | Admitting: Gastroenterology

## 2023-04-01 ENCOUNTER — Other Ambulatory Visit (INDEPENDENT_AMBULATORY_CARE_PROVIDER_SITE_OTHER): Payer: BC Managed Care – PPO

## 2023-04-01 ENCOUNTER — Other Ambulatory Visit: Payer: Self-pay

## 2023-04-01 ENCOUNTER — Encounter: Payer: Self-pay | Admitting: Gastroenterology

## 2023-04-01 VITALS — BP 118/70 | HR 103 | Ht 64.0 in | Wt 284.0 lb

## 2023-04-01 DIAGNOSIS — Z79899 Other long term (current) drug therapy: Secondary | ICD-10-CM

## 2023-04-01 DIAGNOSIS — K743 Primary biliary cirrhosis: Secondary | ICD-10-CM

## 2023-04-01 DIAGNOSIS — K760 Fatty (change of) liver, not elsewhere classified: Secondary | ICD-10-CM | POA: Diagnosis not present

## 2023-04-01 DIAGNOSIS — Z6841 Body Mass Index (BMI) 40.0 and over, adult: Secondary | ICD-10-CM

## 2023-04-01 DIAGNOSIS — K50818 Crohn's disease of both small and large intestine with other complication: Secondary | ICD-10-CM | POA: Diagnosis not present

## 2023-04-01 LAB — CBC WITH DIFFERENTIAL/PLATELET
Basophils Absolute: 0.1 10*3/uL (ref 0.0–0.1)
Basophils Relative: 1 % (ref 0.0–3.0)
Eosinophils Absolute: 0.3 10*3/uL (ref 0.0–0.7)
Eosinophils Relative: 2.2 % (ref 0.0–5.0)
HCT: 40.6 % (ref 36.0–46.0)
Hemoglobin: 13.1 g/dL (ref 12.0–15.0)
Lymphocytes Relative: 38.5 % (ref 12.0–46.0)
Lymphs Abs: 5.2 10*3/uL — ABNORMAL HIGH (ref 0.7–4.0)
MCHC: 32.3 g/dL (ref 30.0–36.0)
MCV: 83.4 fL (ref 78.0–100.0)
Monocytes Absolute: 0.7 10*3/uL (ref 0.1–1.0)
Monocytes Relative: 5 % (ref 3.0–12.0)
Neutro Abs: 7.2 10*3/uL (ref 1.4–7.7)
Neutrophils Relative %: 53.3 % (ref 43.0–77.0)
Platelets: 653 10*3/uL — ABNORMAL HIGH (ref 150.0–400.0)
RBC: 4.87 Mil/uL (ref 3.87–5.11)
RDW: 15.2 % (ref 11.5–15.5)
WBC: 13.6 10*3/uL — ABNORMAL HIGH (ref 4.0–10.5)

## 2023-04-01 NOTE — Patient Instructions (Addendum)
Please go to the lab in the basement of our building to have lab work done as you leave today. Hit "B" for basement when you get on the elevator.  When the doors open the lab is on your left.  We will call you with the results. Thank you.  You will be due for a fecal calprotectin stool study in April.    Please follow up in June (please call back in 2 weeks to check if the schedule is out yet).   Thank you for entrusting me with your care and for choosing [facility], Dr. Ileene Patrick    If your blood pressure at your visit was 140/90 or greater, please contact your primary care physician to follow up on this. ______________________________________________________  If you are age 36 or older, your body mass index should be between 23-30. Your Body mass index is 48.75 kg/m. If this is out of the aforementioned range listed, please consider follow up with your Primary Care Provider.  If you are age 59 or younger, your body mass index should be between 19-25. Your Body mass index is 48.75 kg/m. If this is out of the aformentioned range listed, please consider follow up with your Primary Care Provider.  ________________________________________________________  The [facility] providers would like to encourage you to use [facility] to communicate with providers for non-urgent requests or questions.  Due to long hold times on the telephone, sending your provider a message by [facility] may be a faster and more efficient way to get a response.  Please allow 48 business hours for a response.  Please remember that this is for non-urgent requests.  _______________________________________________________  Due to recent changes in healthcare laws, you may see the results of your imaging and laboratory studies on MyChart before your provider has had a chance to review them.  We understand that in some cases there may be results that are confusing or concerning to you. Not all laboratory results come  back in the same time frame and the provider may be waiting for multiple results in order to interpret others.  Please give [place] 48 hours in order for your provider to thoroughly review all the results before contacting the office for clarification of your results.

## 2023-04-01 NOTE — Progress Notes (Signed)
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     Recap of major GI issues:   Liver disease: intermittent elevations in her liver enzymes for years.  Denies any family history of liver disease or cirrhosis.  Dates back to at least 2012 with her pregnancy.  She does not drink any alcohol routinely.  She had a serologic workup in 2020 and ultimately a liver biopsy.  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  Stage I fibrosis noted at that time. She had positive ASMA but her IgG levels were normal. She was started on ursodiol.  Prescribed 900 mg twice daily (based on weight, 13 to 15 mg/kg). She has had a history of osteopenia on remote DEXA scan that was done in 2021. She also has a vitamin D deficiency.   Follows with [facility].  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  MRCP shows no evidence of PSC.  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   Crohn's disease: Back in 2020 she had an abnormal CT scan in February 2020 showing a suspected ascending colon mass with lymphadenopathy.  Also ventral wall hernia noted.  This led to a colonoscopy the following month which showed some inflammatory changes in the right colon and edema, biopsies were taken and were normal.   She states over time she has developed diarrhea with significant urgency and mucus and rectal bleeding.  This led to a follow-up colonoscopy with Dr. Levon Hedger in March 2022.  She had overt colitis and most of her colon, biopsies showed chronic active colitis.  She also had ileitis on biopsies although the ileum appeared normal.  Of note the rectum and sigmoid colon were normal on biopsies.  It appears she was started on Lialda to treat this initially.  She states she had paradoxical worsening on this regimen and her diarrhea and GI symptoms got much worse so she stopped it.  She states he was not aware of any  other treatment for her colitis, essentially has not been on anything since that time.   She had been placed on Entyvio in April 2024.  Fecal calprotectin markedly elevated in the 2000's. Transitioned to [place] end of 2024.     SINCE LAST VISIT:    Patient here for follow-up visit.  Recall after her last visit we did a stool test which was positive for Salmonella.  She was treated with a course of Cipro.  She states that did help her symptoms to some extent but she continued to have loose stools and abdominal pain.  We eventually got approval to switch her biologic to [employer].  Entyvio was stopped.  She has completed 3 infusions of Skyrizi from October through December 26.  She states she is tolerating it pretty well in general he is doing better.  She states the prednisone taper we gave her helped with her bowel symptoms but her mood was quite variable and she did not like being on the steroids.  Currently she has a few bowel movements in the morning and a few more later in the day.  She does not have any significant abdominal pain most of the time, when she has menstrual cycles that can become worse.  She does have some urgency with her stools at time but no blood.  She thinks in general she is doing better on the [place].  She has not  received any outpatient education or notification of getting the injection which she is due to start very soon  Since have last seen her she has received the Shingrix vaccine as well as the PCV PCV21 vaccine.   She has had QuantiFERON gold that has been negative this year.     In regards to her liver disease, she has seen [facility], Annamarie Major since our last visit.  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  MRCP negative. Also component of MALSD. She remains on ursodiol, LAEs show elevated AP , AST / ALT normal.  She has had follow-up visit with [facility], they had fracture lysed her alk phos and there is a significant intestinal component and thus they have not  recommended more aggressive therapy of PBC at this point time.  She remains on ursodiol.  Further, for her fatty liver and obesity she was referred to Dr. Donnamarie Rossetti of [facility].  They have seen her and are starting a calorie restricted diet for her with high-protein.  She inquired if she could start one of the few medications offered to her such as Topamax, phentermine, and GLP-1 agonists.  She asked my opinion on this   Of note, on labs in January 28 that she had WBC of 10.5 and platelet count of 598.  Hemoglobin 12.0.   Prior workup: Celiac panel negative 04/27/20   Colonoscopy 05/05/20: - The examined portion of the ileum was normal. Biopsied. - Diffuse mild inflammation was found in the descending colon, in the transverse colon, in the ascending colon and in the cecum secondary to inflammatory bowel disease. Biopsied. - The distal rectum and anal verge are normal on retroflexion view.   FINAL MICROSCOPIC DIAGNOSIS:  A. TERMINAL ILEUM, BIOPSY: - Chronic mildly active ileitis. - No granuloma. - No dysplasia or malignancy.  B. COLON, CECAL, BIOPSY: - Chronic severally active colitis. - No granulomas. - No dysplasia or malignancy.  C. COLON, ASCENDING, BIOPSY: - Chronic moderately active colitis. - No granulomas. - No dysplasia or malignancy.  D. COLON, TRANSVERSE, BIOPSY: - Chronic moderately active colitis. - No granulomas. - No dysplasia or malignancy.  E. COLON, DESCENDING, BIOPSY: - Chronic moderately active colitis. - No granulomas. - No dysplasia or malignancy.  F. COLON, SIGMOID, BIOPSY: - Benign colonic mucosa. - No active inflammation. - No granulomas. - No dysplasia or malignancy.  G. RECTUM, BIOPSY: - Benign colonic mucosa. - No active inflammation. - No granulomas. - No dysplasia or malignancy.  COMMENT:  A-E. The findings are consistent with inflammatory bowel disease and the features favor Crohn's.     Colonoscopy  04/17/18: - The examined portion of the ileum was normal. - Congested mucosa in the ascending colon and in the cecum. Biopsied. - External hemorrhoids.  Colon, biopsy, cecal and ascending - BENIGN COLONIC MUCOSA WITH UNDERLYING LYMPHOID AGGREGATES. - NO DYSPLASIA OR MALIGNANCY.     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 The biopsies show multiple portal-based granulomas, some with typical 'florid duct lesions'. Similar to overall inflammation, there is patchy but significant bile duct injury including changes consistent with patchy ductopenia. There is associated mild ductular reaction and chronic  cholestasis (confirmed with immunohistochemical stains for CK7). Many of the portal tracts also show onion skin-like periductal fibrosis and focal bile duct scars. The trichrome and reticulin stains show patchy, mild portal fibrosis (please note that the degree of fibrosis can be sometimes slightly exaggerated due to subcapsular nature of the biopsy). Patient's elevated serum alkaline phosphatase levels and negative anti-mitochondrial antibody titer are noted. 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 Differential diagnosis can include sarcoidosis and primary sclerosing cholangitis (small duct type) but appear to be less likely. Rarely, drugs can cause biliary tract patterns of injury with  granulomas. PASD stain is negative for intracytoplasmic globules. Iron stain is negative for stainable iron.     Fecal calprotectin 06/06/22: 2850   QG negative Hep B surface antigen and hep B core AB negative AMA negative GGT 103 Vit D - 12.77 CRP 12     RUQ [place] elastography 06/18/22: IMPRESSION: ULTRASOUND RUQ: Increased parenchymal echogenicity, suggesting hepatic steatosis. ULTRASOUND HEPATIC ELASTOGRAPHY: Median kPa:  3.3 Diagnostic category:  < or = 5 kPa: high probability of being normal    IgG4 45   MRCP 09/22/2022: IMPRESSION: 1. No MRI evidence of hepatic fibrosis. 2. Visualized portions of the colon demonstrate loss of haustration and some submucosal fibrofatty deposition, nonspecific but can be seen in the setting of chronic inflammatory bowel disease.       Fecal calprotectin 06/06/2022 - 2850   Fecal calprotectin 11/08/22 - 2680    Past Medical History:  Diagnosis Date   Breast nodule 10/10/2014   Breast pain 10/10/2014   BV (bacterial vaginosis) 11/12/2012   Crohn's disease (HCC)    Depression    Fatty liver    Hashimoto's disease    Hypothyroid 02/14/2015   Irregular menses 05/21/2013   Migraines    Obesity    Osteopenia 2021   Other and unspecified ovarian cyst 05/31/2013   Right complex ?cystic vs solid mass on ovary will schedule appt with [facility]   Pregnant 02/14/2015   Primary biliary cholangitis (HCC)    Thyroid disease    Reported Hashimoto's Thyroiditis in past     Past Surgical History:  Procedure Laterality Date   BIOPSY  04/17/2018   Procedure: BIOPSY;  Surgeon: Malissa Hippo, MD;  Location: [facility];  Service: Endoscopy;;  colon   BIOPSY  05/05/2020   Procedure: BIOPSY;  Surgeon: Dolores Frame, MD;  Location: [facility];  Service: Gastroenterology;;   CESAREAN SECTION     COLONOSCOPY     COLONOSCOPY WITH PROPOFOL N/A 04/17/2018   Procedure: COLONOSCOPY WITH PROPOFOL;  Surgeon: Malissa Hippo, MD;   [facility]: [facility];  Service: Endoscopy;  Laterality: N/A;  10:50   COLONOSCOPY WITH PROPOFOL N/A 05/05/2020   Procedure: COLONOSCOPY WITH PROPOFOL;  Surgeon: Dolores Frame, MD;  Location: [facility];  Service: Gastroenterology;  Laterality: N/A;  AM   LIVER BIOPSY N/A 08/17/2018   Procedure: LIVER BIOPSY;  Surgeon: Lucretia Roers, MD;  Location: [facility];  Service: General;  Laterality: N/A;   OOPHORECTOMY Right 10/2013   TONSILLECTOMY     TUBAL LIGATION Bilateral 08/17/2018   Procedure: LAPAROSCOPIC BILATERAL TUBAL LIGATION WITH FALLOPE RINGS;  Surgeon: Tilda Burrow, MD;  Location: [facility];  Service: Gynecology;  Laterality: Bilateral;   VENTRAL HERNIA REPAIR  N/A 08/17/2018   Procedure: LAPAROSCOPIC VENTRAL HERNIA WITH MESH;  Surgeon: Lucretia Roers, MD;  Location: [facility];  Service: General;  Laterality: N/A;   WISDOM TOOTH EXTRACTION     Family History  Problem Relation Age of Onset   Diabetes Mother    Hypertension Mother    Heart attack Mother    Stroke Mother    Seizures Father    Other Father        back problems   Diabetes Maternal Grandmother    Hypertension Maternal Grandmother    Cancer Maternal Grandmother        breast   Diabetes Maternal Grandfather    Hypertension Maternal Grandfather    Leukemia Maternal Grandfather    AAA (abdominal aortic aneurysm) Maternal Grandfather    Dementia Paternal Grandmother    Kidney disease Paternal Grandfather    Cancer Maternal Aunt    Diabetes Maternal Uncle    Colon cancer Cousin    Ovarian cancer Cousin    Stomach cancer Other    Ulcerative colitis Neg Hx    Crohn's disease Neg Hx    Social History   Tobacco Use   Smoking status: Former    Current packs/day: 0.00    Average packs/day: 0.3 packs/day for 8.0 years (2.0 ttl pk-yrs)    Types: Cigarettes    Start date: 10/07/2002    Quit date: 10/07/2010    Years since quitting: 12.4   Smokeless tobacco: Never  Vaping Use   Vaping status:  Never Used  Substance Use Topics   Alcohol use: Yes    Comment: rare   Drug use: No   Current Outpatient Medications  Medication Sig Dispense Refill   Calcium Carbonate-Vit D-Min (CALCIUM 1200) 1200-1000 MG-UNIT CHEW Chew 1 tablet by mouth daily. 30 tablet 0   calcium-vitamin D (OSCAL WITH D) 500-200 MG-UNIT tablet Take 1 tablet by mouth daily with breakfast. 90 tablet 1   cholecalciferol (VITAMIN D3) 25 MCG (1000 UNIT) tablet Take 1,000 Units by mouth daily.     dicyclomine (BENTYL) 10 MG capsule Take 1 capsule (10 mg total) by mouth every 12 (twelve) hours as needed for spasms. 60 capsule 1   levothyroxine (SYNTHROID) 137 MCG tablet Take 150 mcg by mouth daily.     OVER THE COUNTER MEDICATION Take 200 mg by mouth daily. Magnesium     risankizumab-rzaa 600 mg in sodium chloride 0.9 % 250 mL Inject 600 mg into the vein once.     ursodiol (ACTIGALL) 300 MG capsule Take 2 capsules (600 mg total) by mouth 3 (three) times daily. 180 capsule 5   Vitamin D, Ergocalciferol, (DRISDOL) 1.25 MG (50000 UNIT) CAPS capsule Take 50,000 Units by mouth every 7 (seven) days.     Zoster Vaccine Adjuvanted [employer]) injection Please administer vaccine for patient with Crohns 0.5 mL 0   Cholecalciferol (VITAMIN D3) 125 MCG (5000 UT) CAPS Take 1 capsule (5,000 Units total) by mouth daily. (Patient not taking: Reported on 04/01/2023) 30 capsule 0   norelgestromin-ethinyl estradiol [employer]) 150-35 MCG/24HR transdermal patch Place 1 patch onto the skin once a week. (Patient not taking: Reported on 04/01/2023) 9 patch 4   Vedolizumab (ENTYVIO ) Inject into the skin. (Patient not taking: Reported on 04/01/2023)     No current facility-administered medications for this visit.   Allergies  Allergen Reactions   Imitrex [Sumatriptan]     Makes migraines worse.    Maxalt [Rizatriptan]     Makes migraines worse  Phenergan [Promethazine Hcl] Nausea And Vomiting    Pill form; IV ok   Tape Other (See Comments)     Blisters on abdomen after C Section     Review of Systems: All systems reviewed and negative except where noted in HPI.   Labs reviewed in epic  Physical Exam: BP 118/70   Pulse (!) 103   Ht 5\' 4"  (1.626 m)   Wt 284 lb (128.8 kg)   LMP 03/08/2023   BMI 48.75 kg/m  Constitutional: Pleasant,well-developed, female in no acute distress. Neurological: Alert and oriented to person place and time. Psychiatric: Normal mood and affect. Behavior is normal.   ASSESSMENT: 38 y.o. female here for assessment of the following  1. Crohn's disease of both small and large intestine with other complication (HCC)   2. High risk medication use   3. Primary biliary cholangitis (HCC)   4. Metabolic dysfunction-associated steatotic liver disease (MASLD)   5. Morbid obesity (HCC)    Patient has significantly active Crohn's disease, paradoxical worsening with Lialda, failure of improvement on Entyvio.  Recently changed to [facility] and clinically doing better.  She has completed her infusions and now over due to start injectable therapy but for some reason has not had any confirmation of getting the drug nor education about it.  I will reach out to our nursing staff to make sure outpatient injectable Cristy Folks has been ordered / approved and we should start this ASAP. Will place request to get 360mg  subcutaneous every 12 weeks.  I discussed risks of the therapy with her and she understands and wishes to continue.  When she is on stable dosing of this we will repeat fecal calprotectin.  I will recheck CBC today to reassess her platelet count and WBC which were elevated previously, suspect reactive to her colitis.  Otherwise following closely with hepatology for her PBC and fatty liver.  Elevated alk phos appears to be coming from her bowel inflammation.  Hepatology is monitoring, continuing ursodiol.  She has been referred to bariatrics for weight loss.  I think any of these drugs are okay to start for her weight  loss in relation to her Crohn's.  In fact given her fatty liver disease and diarrhea, GLP-1 agonist I think would help her fatty liver, that may be preferable initial regimen for her if we can get her Crohn's disease a bit better controlled.  She understands and will ask Dr. Cletis Athens about this.   PLAN: - talk with nursing staff about ordering / approval for Skyrizi 360mg  subcutaneous q 12 weeks, start ASAP - CBC today - fecal calprotectin in a few months  - f/u clinic 4 months - continue ursodiol - no change in med, AP elevation seems related to her bowel - follow up with weight loss clinic - okay to use GLP1 agonist or other regimens for weight loss from my perspective  Harlin Rain, MD [facility]

## 2023-04-02 ENCOUNTER — Other Ambulatory Visit (HOSPITAL_COMMUNITY): Payer: Self-pay

## 2023-04-02 ENCOUNTER — Telehealth: Payer: Self-pay

## 2023-04-02 ENCOUNTER — Telehealth: Payer: Self-pay | Admitting: Pharmacy Technician

## 2023-04-02 ENCOUNTER — Telehealth: Payer: Self-pay | Admitting: Pharmacist

## 2023-04-02 DIAGNOSIS — K50818 Crohn's disease of both small and large intestine with other complication: Secondary | ICD-10-CM

## 2023-04-02 MED ORDER — SKYRIZI 360 MG/2.4ML ~~LOC~~ SOCT: 1.0000 | SUBCUTANEOUS | Status: DC

## 2023-04-02 NOTE — Telephone Encounter (Signed)
-----   Message from Benancio Deeds sent at 04/01/2023  6:16 PM EST ----- Regarding: [name] question [facility].  Question about [name] for this patient. She got approved for outpatient IV Skyrizi infusions which she completed in December, but she is overdue to start her subcu Skyrizi injections dosed at 360 mg every 8 weeks.  Can you check to see if this patient got approved for outpatient Skyrizi injection and if so when she can get it?  Need to start this ASAP, she is overdue starting it after having the IV infusion.  Can you let me know what you find out or how long this may take? Thanks very much I appreciate all your help with it.  Dr. Mervyn Skeeters

## 2023-04-02 NOTE — Telephone Encounter (Signed)
An expedited appeal has been submitted for [employer]. Will advise when response is received.  Appeal letter and supporting documentation have been faxed to [phone] on 04/02/2023 @4 :40 pm/  Thank you, Dellie Burns, PharmD Clinical Pharmacist  [place]  Direct Dial: [phone]

## 2023-04-02 NOTE — Telephone Encounter (Signed)
Pharmacy Patient Advocate Encounter   Received notification from Physician's Office that prior authorization for SKYRIZI 360MG  is required/requested.   Insurance verification completed.   The patient is insured through [facility] .   Per test claim: PA required; PA submitted to above mentioned insurance via CoverMyMeds Key/confirmation #/EOC [facility] Status is pending

## 2023-04-02 NOTE — Telephone Encounter (Signed)
Pharmacy Patient Advocate Encounter  Received notification from [facility] that Prior Authorization for [facility] 360MG  has been DENIED.  Full denial letter will be uploaded to the media tab. See denial reason below.   PA #/Case ID/Reference #:  [id number]

## 2023-04-02 NOTE — Telephone Encounter (Signed)
Good morning, [place]!   Will you be able to submit and expedited PA for Skyrizi 360 mg every 8 weeks? She is overdue to maintenance injection. I will place order as 'No print".   Dx: Crohn's disease of both small and large intestine with other complication (K50.818).   Thanks,  [place], Charity fundraiser

## 2023-04-02 NOTE — Telephone Encounter (Signed)
PA has been submitted and will likely be denied due to not having a trial of any of the preferred medications. Notice was not provided before start of infusion therapy to start PA for maintenance dosing of Skyrizi. If denied provider (Dr. Adela Lank) would like to appeal.

## 2023-04-03 ENCOUNTER — Other Ambulatory Visit: Payer: Self-pay

## 2023-04-03 DIAGNOSIS — D75839 Thrombocytosis, unspecified: Secondary | ICD-10-CM

## 2023-04-03 DIAGNOSIS — D72829 Elevated white blood cell count, unspecified: Secondary | ICD-10-CM

## 2023-04-03 NOTE — Telephone Encounter (Signed)
See alternate telephone encounters from yesterday with update

## 2023-04-03 NOTE — Telephone Encounter (Signed)
Okay great. Thank you. If they deny it I would like to appeal. She has severe colitis. Thanks

## 2023-04-04 ENCOUNTER — Other Ambulatory Visit (HOSPITAL_COMMUNITY): Payer: Self-pay

## 2023-04-07 ENCOUNTER — Other Ambulatory Visit (HOSPITAL_COMMUNITY): Payer: Self-pay

## 2023-04-07 MED ORDER — SKYRIZI 360 MG/2.4ML ~~LOC~~ SOCT: 1.0000 | SUBCUTANEOUS | 5 refills | Status: DC

## 2023-04-07 NOTE — Telephone Encounter (Signed)
Great, thank you very much 

## 2023-04-07 NOTE — Addendum Note (Signed)
 Addended by: Marisa Sprinkles on: 04/07/2023 08:40 AM   Modules accepted: Orders

## 2023-04-07 NOTE — Telephone Encounter (Signed)
 Skyrizi 360 mg RX sent to [facility]. Patient notified via MyChart.

## 2023-04-07 NOTE — Telephone Encounter (Signed)
 Thank you very much this is great news. Hopefully it is for every 8 weeks? I originally had in my note every 12 weeks, but I meant that was supposed to start at week 12 but should be every 8 weeks. Hopefully once on this she can submit fecal calprotectin in a few months to assess response, as I had discussed with her in the office. thanks

## 2023-04-07 NOTE — Telephone Encounter (Signed)
 Yes, RX was sent for every 8 weeks. Will place 3 month reminder for fecal calprotectin.

## 2023-04-07 NOTE — Telephone Encounter (Signed)
 Received a message from [facility] that Cristy Folks had been approved-a letter will be mailed to the office.  Thank you, Dellie Burns, PharmD Clinical Pharmacist  [place]  Direct Dial: [phone]

## 2023-04-08 ENCOUNTER — Other Ambulatory Visit (HOSPITAL_COMMUNITY): Payer: Self-pay

## 2023-04-08 ENCOUNTER — Telehealth: Payer: Self-pay | Admitting: Pharmacy Technician

## 2023-04-08 DIAGNOSIS — K50818 Crohn's disease of both small and large intestine with other complication: Secondary | ICD-10-CM

## 2023-04-08 NOTE — Telephone Encounter (Signed)
 ERROR

## 2023-04-09 ENCOUNTER — Other Ambulatory Visit (HOSPITAL_COMMUNITY): Payer: Self-pay

## 2023-04-09 MED ORDER — SKYRIZI 360 MG/2.4ML ~~LOC~~ SOCT: 1.0000 | SUBCUTANEOUS | 5 refills | Status: DC

## 2023-04-09 NOTE — Addendum Note (Signed)
 Addended by: Marisa Sprinkles on: 04/09/2023 02:47 PM   Modules accepted: Orders

## 2023-04-09 NOTE — Telephone Encounter (Signed)
 Skyrizi prescription sent to [facility] per insurance request.

## 2023-04-10 ENCOUNTER — Other Ambulatory Visit: Payer: Self-pay | Admitting: *Deleted

## 2023-04-10 ENCOUNTER — Inpatient Hospital Stay: Payer: BC Managed Care – PPO | Attending: Oncology | Admitting: Oncology

## 2023-04-10 ENCOUNTER — Other Ambulatory Visit: Payer: Self-pay

## 2023-04-10 ENCOUNTER — Encounter: Payer: Self-pay | Admitting: Oncology

## 2023-04-10 ENCOUNTER — Inpatient Hospital Stay: Payer: BC Managed Care – PPO | Admitting: Oncology

## 2023-04-10 VITALS — BP 113/93 | HR 77 | Temp 98.6°F | Resp 18 | Ht 63.98 in | Wt 286.6 lb

## 2023-04-10 DIAGNOSIS — E063 Autoimmune thyroiditis: Secondary | ICD-10-CM | POA: Insufficient documentation

## 2023-04-10 DIAGNOSIS — D75839 Thrombocytosis, unspecified: Secondary | ICD-10-CM

## 2023-04-10 DIAGNOSIS — R7989 Other specified abnormal findings of blood chemistry: Secondary | ICD-10-CM | POA: Diagnosis not present

## 2023-04-10 DIAGNOSIS — K50818 Crohn's disease of both small and large intestine with other complication: Secondary | ICD-10-CM | POA: Diagnosis not present

## 2023-04-10 DIAGNOSIS — K50911 Crohn's disease, unspecified, with rectal bleeding: Secondary | ICD-10-CM | POA: Insufficient documentation

## 2023-04-10 DIAGNOSIS — Z79899 Other long term (current) drug therapy: Secondary | ICD-10-CM | POA: Insufficient documentation

## 2023-04-10 DIAGNOSIS — D72820 Lymphocytosis (symptomatic): Secondary | ICD-10-CM

## 2023-04-10 DIAGNOSIS — G479 Sleep disorder, unspecified: Secondary | ICD-10-CM | POA: Insufficient documentation

## 2023-04-10 DIAGNOSIS — D72829 Elevated white blood cell count, unspecified: Secondary | ICD-10-CM | POA: Diagnosis present

## 2023-04-10 DIAGNOSIS — Z90721 Acquired absence of ovaries, unilateral: Secondary | ICD-10-CM | POA: Insufficient documentation

## 2023-04-10 DIAGNOSIS — Z87891 Personal history of nicotine dependence: Secondary | ICD-10-CM | POA: Insufficient documentation

## 2023-04-10 DIAGNOSIS — R0602 Shortness of breath: Secondary | ICD-10-CM | POA: Diagnosis not present

## 2023-04-10 DIAGNOSIS — K7400 Hepatic fibrosis, unspecified: Secondary | ICD-10-CM | POA: Insufficient documentation

## 2023-04-10 DIAGNOSIS — K743 Primary biliary cirrhosis: Secondary | ICD-10-CM | POA: Diagnosis not present

## 2023-04-10 DIAGNOSIS — Z888 Allergy status to other drugs, medicaments and biological substances status: Secondary | ICD-10-CM | POA: Insufficient documentation

## 2023-04-10 LAB — COMPREHENSIVE METABOLIC PANEL
ALT: 32 U/L (ref 0–44)
AST: 39 U/L (ref 15–41)
Albumin: 3.5 g/dL (ref 3.5–5.0)
Alkaline Phosphatase: 200 U/L — ABNORMAL HIGH (ref 38–126)
Anion gap: 10 (ref 5–15)
BUN: 11 mg/dL (ref 6–20)
CO2: 24 mmol/L (ref 22–32)
Calcium: 9.5 mg/dL (ref 8.9–10.3)
Chloride: 105 mmol/L (ref 98–111)
Creatinine, Ser: 0.67 mg/dL (ref 0.44–1.00)
GFR, Estimated: >60 mL/min (ref >60)
Glucose, Bld: 98 mg/dL (ref 70–99)
Potassium: 4.1 mmol/L (ref 3.5–5.1)
Sodium: 139 mmol/L (ref 135–145)
Total Bilirubin: 0.7 mg/dL (ref 0.0–1.2)
Total Protein: 7.4 g/dL (ref 6.5–8.1)

## 2023-04-10 LAB — CBC WITH DIFFERENTIAL/PLATELET
Abs Immature Granulocytes: 0.04 10*3/uL (ref 0.00–0.07)
Basophils Absolute: 0.2 10*3/uL — ABNORMAL HIGH (ref 0.0–0.1)
Basophils Relative: 2 %
Eosinophils Absolute: 0.3 10*3/uL (ref 0.0–0.5)
Eosinophils Relative: 3 %
HCT: 40.1 % (ref 36.0–46.0)
Hemoglobin: 12.8 g/dL (ref 12.0–15.0)
Immature Granulocytes: 0 %
Lymphocytes Relative: 40 %
Lymphs Abs: 4 10*3/uL (ref 0.7–4.0)
MCH: 27.1 pg (ref 26.0–34.0)
MCHC: 31.9 g/dL (ref 30.0–36.0)
MCV: 84.8 fL (ref 80.0–100.0)
Monocytes Absolute: 0.6 10*3/uL (ref 0.1–1.0)
Monocytes Relative: 6 %
Neutro Abs: 4.9 10*3/uL (ref 1.7–7.7)
Neutrophils Relative %: 49 %
Platelets: 651 10*3/uL — ABNORMAL HIGH (ref 150–400)
RBC: 4.73 MIL/uL (ref 3.87–5.11)
RDW: 15.6 % — ABNORMAL HIGH (ref 11.5–15.5)
WBC: 9.9 10*3/uL (ref 4.0–10.5)
nRBC: 0 % (ref 0.0–0.2)

## 2023-04-10 LAB — IRON AND TIBC
Iron: 27 ug/dL — ABNORMAL LOW (ref 28–170)
Saturation Ratios: 7 % — ABNORMAL LOW (ref 10.4–31.8)
TIBC: 412 ug/dL (ref 250–450)
UIBC: 385 ug/dL

## 2023-04-10 LAB — C-REACTIVE PROTEIN: CRP: 2 mg/dL — ABNORMAL HIGH (ref <1.0)

## 2023-04-10 LAB — URIC ACID: Uric Acid, Serum: 6.6 mg/dL (ref 2.5–7.1)

## 2023-04-10 LAB — LACTATE DEHYDROGENASE: LDH: 112 U/L (ref 98–192)

## 2023-04-10 LAB — VITAMIN B12: Vitamin B-12: 340 pg/mL (ref 180–914)

## 2023-04-10 LAB — FOLATE: Folate: 10.6 ng/mL (ref >5.9)

## 2023-04-10 LAB — SEDIMENTATION RATE: Sed Rate: 37 mm/h — ABNORMAL HIGH (ref 0–22)

## 2023-04-10 LAB — FERRITIN: Ferritin: 8 ng/mL — ABNORMAL LOW (ref 11–307)

## 2023-04-10 NOTE — Progress Notes (Signed)
 Patient made aware of recommendations and IV iron scheduled.  Patient made aware and verbalized understanding.

## 2023-04-10 NOTE — Assessment & Plan Note (Signed)
 Continue to follow with GI.  On Skyrizi currently

## 2023-04-10 NOTE — Assessment & Plan Note (Signed)
 Leukocytosis with lymphocyte predominance likely secondary to chronic inflammation from Crohn's disease. -Will obtain labs including ESR, CRP and repeat CBC today. -Will send for flow cytometry to rule out CLL  Return to clinic in 2 weeks to discuss results

## 2023-04-10 NOTE — Assessment & Plan Note (Signed)
 Increased platelets likely secondary to iron deficiency.  Patient also reports some shortness of breath.?  Medication association since it started after starting monoclonal antibodies for Crohn's disease. -Repeat iron labs today.  Will replace as needed. -Will send for JAK2 testing  Return to clinic in 2 weeks to discuss results

## 2023-04-10 NOTE — Patient Instructions (Signed)
 VISIT SUMMARY:  During today's visit, we discussed your elevated platelet and white blood cell counts, ongoing management of Crohn's disease, chronic fatigue, and menstrual irregularities. We have planned further tests to better understand your condition and adjust your treatment as needed.  YOUR PLAN:  -ELEVATED PLATELET COUNT AND WHITE BLOOD CELL COUNT: Your elevated platelet and white blood cell counts may be due to your chronic inflammatory condition (Crohn's disease) and potential iron deficiency from occasional gastrointestinal bleeding. We will conduct labs to check for iron deficiency and a JAK2 mutation, and order flow cytometry to rule out chronic lymphocytic leukemia (CLL).  -CHRONIC FATIGUE: Chronic fatigue can be caused by multiple factors including Crohn's disease, potential iron deficiency, and other chronic conditions like Hashimoto's and primary biliary cirrhosis. If iron deficiency is confirmed, we will schedule you for an IV iron infusion at the infusion center.  INSTRUCTIONS:  Please follow up in 2 weeks to discuss your lab results and further management.

## 2023-04-10 NOTE — Progress Notes (Signed)
 [facility] at [facility] HEMATOLOGY NEW VISIT  [place], [place], [place]  REASON FOR REFERRAL: Leukocytosis and thrombocytosis  HISTORY OF PRESENT ILLNESS: [name] 38 y.o. female referred for leukocytosis and thrombocytosis.  She has a past medical history of Crohn's disease and is currently on Skyrizi.Previously, she was on Entyvio, which was ineffective in managing her symptoms. She occasionally experiences blood in her stools, which she attributes to her Crohn's disease. She also reports difficulty sleeping, often waking up multiple times during the night to use the bathroom. She denies shortness of breath. She also reports chronic fatigue, which she attributes to her Crohn's disease, Hashimoto's, and primary biliary cirrhosis.  She has a menstrual cycle every month, with alternating months of heavy bleeding and clotting. 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  We reviewed the labs together.  Previous labs did show some iron deficiency but patient has not been aware of it and never took iron supplementation.  Patient denies melena, weight loss, dyspnea on exertion, shortness of breath.  She consumes alcohol rarely, quit smoking in 2012.  She lives with her 5 kids in [place].   I have reviewed the past medical history, past surgical history, social history and family history with the patient   ALLERGIES:  is allergic to imitrex [sumatriptan], maxalt [rizatriptan], phenergan [promethazine hcl], and tape.  MEDICATIONS:  Current Outpatient Medications  Medication Sig Dispense Refill   Calcium Carbonate-Vit D-Min (CALCIUM 1200) 1200-1000 MG-UNIT CHEW Chew 1 tablet by mouth daily. 30 tablet 0   calcium-vitamin D (OSCAL WITH D) 500-200 MG-UNIT tablet Take 1 tablet by mouth daily with breakfast. 90 tablet 1   Cholecalciferol (VITAMIN D3) 125 MCG (5000 UT) CAPS Take 1 capsule (5,000 Units total) by mouth daily. 30  capsule 0   cholecalciferol (VITAMIN D3) 25 MCG (1000 UNIT) tablet Take 1,000 Units by mouth daily.     dicyclomine (BENTYL) 10 MG capsule Take 1 capsule (10 mg total) by mouth every 12 (twelve) hours as needed for spasms. 60 capsule 1   levothyroxine (SYNTHROID) 150 MCG tablet Take 150 mcg by mouth daily before breakfast.     OVER THE COUNTER MEDICATION Take 200 mg by mouth daily. Magnesium     ursodiol (ACTIGALL) 300 MG capsule Take 2 capsules (600 mg total) by mouth 3 (three) times daily. 180 capsule 5   Vitamin D, Ergocalciferol, (DRISDOL) 1.25 MG (50000 UNIT) CAPS capsule Take 50,000 Units by mouth every 7 (seven) days.     ferrous sulfate 324 MG TBEC Take 324 mg by mouth every other day.     norelgestromin-ethinyl estradiol [employer]ico) 150-35 MCG/24HR transdermal patch Place 1 patch onto the skin once a week. (Patient not taking: Reported on 04/10/2023) 9 patch 4   Risankizumab-rzaa (SKYRIZI) 360 MG/2.4ML SOCT Inject 1 Cartridge into the skin every 8 (eight) weeks. (Patient not taking: Reported on 04/10/2023) 2.4 mL 5   No current facility-administered medications for this visit.     REVIEW OF SYSTEMS:   Constitutional: Denies fevers, chills or night sweats Eyes: Denies blurriness of vision Ears, nose, mouth, throat, and face: Denies mucositis or sore throat Respiratory: Denies cough, dyspnea or wheezes Cardiovascular: Denies palpitation, chest discomfort or lower extremity swelling Gastrointestinal:  Denies nausea, heartburn or change in bowel habits Skin: Denies abnormal skin rashes Lymphatics: Denies new lymphadenopathy or easy bruising Neurological:Denies numbness, tingling or new weaknesses Behavioral/Psych: Mood is stable, no new changes  All other systems were reviewed with the patient and are negative.  PHYSICAL EXAMINATION:   Vitals:   04/10/23 1006  BP: (!) 113/93  Pulse: 77  Resp: 18  Temp: 98.6 F (37 C)  SpO2: 99%    GENERAL:alert, no distress and  comfortable LYMPH:  no palpable lymphadenopathy in the cervical, axillary or inguinal LUNGS: clear to auscultation and percussion with normal breathing effort HEART: regular rate & rhythm and no murmurs and no lower extremity edema ABDOMEN:abdomen soft, non-tender and normal bowel sounds Musculoskeletal:no cyanosis of digits and no clubbing  NEURO: alert & oriented x 3 with fluent speech  LABORATORY DATA:  I have reviewed the data as listed   Latest Reference Range & Units 04/01/23 14:14  WBC 4.0 - 10.5 K/uL 13.6 (H)  RBC 3.87 - 5.11 Mil/uL 4.87  Hemoglobin 12.0 - 15.0 g/dL 16.1  HCT 09.6 - 04.5 % 40.6  MCV 78.0 - 100.0 fl 83.4  MCHC 30.0 - 36.0 g/dL 40.9  RDW 81.1 - 91.4 % 15.2  Platelets 150.0 - 400.0 K/uL 653.0 (H)  Neutrophils 43.0 - 77.0 % 53.3  Lymphocytes 12.0 - 46.0 % 38.5  Monocytes Relative 3.0 - 12.0 % 5.0  Eosinophil 0.0 - 5.0 % 2.2  Basophil 0.0 - 3.0 % 1.0  NEUT# 1.4 - 7.7 K/uL 7.2  Lymphs Abs 0.7 - 4.0 K/uL 5.2 (H)  Monocyte # 0.1 - 1.0 K/uL 0.7  Eosinophils Absolute 0.0 - 0.7 K/uL 0.3  Basophils Absolute 0.0 - 0.1 K/uL 0.1  (H): Data is abnormally high    Chemistry      Component Value Date/Time   NA 139 04/10/2023 1046   NA 139 12/20/2017 1043   K 4.1 04/10/2023 1046   CL 105 04/10/2023 1046   CO2 24 04/10/2023 1046   BUN 11 04/10/2023 1046   BUN 4 (L) 12/20/2017 1043   CREATININE 0.67 04/10/2023 1046   CREATININE 0.53 04/27/2020 1108      Component Value Date/Time   CALCIUM 9.5 04/10/2023 1046   ALKPHOS 200 (H) 04/10/2023 1046   AST 39 04/10/2023 1046   ALT 32 04/10/2023 1046   BILITOT 0.7 04/10/2023 1046   BILITOT 0.4 12/20/2017 1043      Latest Reference Range & Units 06/03/22 15:36  Iron 42 - 145 ug/dL 74  TIBC 782.9 - 562.1 mcg/dL 308.6  Saturation Ratios 20.0 - 50.0 % 18.9 (L)  Ferritin 10.0 - 291.0 ng/mL 10.8  Transferrin 212.0 - 360.0 mg/dL 578.4  (L): Data is abnormally low  RADIOGRAPHIC STUDIES: I have personally reviewed the  radiological images as listed and agreed with the findings in the report.  MR ABDOMEN MRCP WO CONTRAST CLINICAL DATA:  Primary biliary cholangitis, evaluate hepatic fibrosis, elevated liver function tests.  EXAM: MRI ABDOMEN WITHOUT CONTRAST  (INCLUDING MRCP)  TECHNIQUE: Multiplanar multisequence MR imaging of the abdomen was performed. Heavily T2-weighted images of the biliary and pancreatic ducts were obtained, and three-dimensional MRCP images were rendered by post processing.  COMPARISON:  CT April 07, 2018.  FINDINGS: Lower chest: No acute abnormality  Hepatobiliary: No significant hepatic fat or iron deposition. No contour nodularity or fissural widening. No enlargement of the caudate or lateral left lobe of the liver. No suspicious hepatic lesion on noncontrast enhanced examination  Gallbladder is unremarkable.  No biliary ductal dilation  Pancreas: Intrinsic T1 signal of the pancreatic parenchyma is within normal limits. No pancreatic ductal dilation.  Spleen:  No splenomegaly.  Adrenals/Urinary Tract: Bilateral adrenal glands  appear normal. No hydronephrosis. No suspicious renal mass.  Stomach/Bowel: Visualized portions of the colon demonstrate loss of haustration and some submucosal fibrofatty deposition  Vascular/Lymphatic: Normal caliber abdominal aorta. Smooth IVC contours. No pathologically enlarged abdominal lymph nodes.  Other: Prior ventral hernia repair with a fat containing recurrent ventral hernia  Musculoskeletal: No suspicious bone lesions identified.  IMPRESSION: 1. No MRI evidence of hepatic fibrosis. 2. Visualized portions of the colon demonstrate loss of haustration and some submucosal fibrofatty deposition, nonspecific but can be seen in the setting of chronic inflammatory bowel disease.  Electronically Signed   By: Maudry Mayhew M.D.   On: 09/22/2022 09:08    ASSESSMENT & PLAN:  Patient is a 38 year old female with past medical  history of Crohn's disease referred for thrombocytosis and leukocytosis   Leukocytosis Leukocytosis with lymphocyte predominance likely secondary to chronic inflammation from Crohn's disease. -Will obtain labs including ESR, CRP and repeat CBC today. -Will send for flow cytometry to rule out CLL  Return to clinic in 2 weeks to discuss results  Thrombocytosis Increased platelets likely secondary to iron deficiency.  Patient also reports some shortness of breath.?  Medication association since it started after starting monoclonal antibodies for Crohn's disease. -Repeat iron labs today.  Will replace as needed. -Will send for JAK2 testing  Return to clinic in 2 weeks to discuss results  Inflammatory bowel disease (Crohn's disease) (HCC) Continue to follow with GI.  On Skyrizi currently   Orders Placed This Encounter  Procedures   Ferritin    Standing Status:   Future    Number of Occurrences:   1    Expected Date:   04/10/2023    Expiration Date:   04/09/2024   Folate    Standing Status:   Future    Number of Occurrences:   1    Expected Date:   04/10/2023    Expiration Date:   04/09/2024   Vitamin B12    Standing Status:   Future    Number of Occurrences:   1    Expected Date:   04/10/2023    Expiration Date:   04/09/2024   CBC with Differential/Platelet    Standing Status:   Future    Number of Occurrences:   1    Expected Date:   04/10/2023    Expiration Date:   04/09/2024   Comprehensive metabolic panel    Standing Status:   Future    Number of Occurrences:   1    Expected Date:   04/10/2023    Expiration Date:   04/09/2024   Iron and TIBC    Standing Status:   Future    Number of Occurrences:   1    Expected Date:   04/10/2023    Expiration Date:   04/09/2024   Lactate dehydrogenase    Standing Status:   Future    Number of Occurrences:   1    Expected Date:   04/10/2023    Expiration Date:   04/09/2024   Uric acid    Standing Status:   Future    Number of Occurrences:    1    Expected Date:   04/10/2023    Expiration Date:   04/09/2024   Flow Cytometry, Peripheral Blood (Oncology)    Standing Status:   Future    Number of Occurrences:   1    Expected Date:   04/10/2023    Expiration Date:   04/09/2024   JAK2 V617F  rfx CALR/MPL/E12-15    Standing Status:   Future    Number of Occurrences:   1    Expected Date:   04/10/2023    Expiration Date:   04/09/2024   C-reactive protein    Standing Status:   Future    Number of Occurrences:   1    Expected Date:   04/10/2023    Expiration Date:   04/09/2024   Sedimentation rate    Standing Status:   Future    Number of Occurrences:   1    Expected Date:   04/10/2023    Expiration Date:   04/09/2024    The total time spent in the appointment was 60 minutes encounter with patients including review of chart and various tests results, discussions about plan of care and coordination of care plan   All questions were answered. The patient knows to call the clinic with any problems, questions or concerns. No barriers to learning was detected.   Cindie Crumbly, MD 2/27/20253:30 PM

## 2023-04-11 LAB — SURGICAL PATHOLOGY

## 2023-04-14 ENCOUNTER — Other Ambulatory Visit: Payer: Self-pay | Admitting: Oncology

## 2023-04-14 DIAGNOSIS — E611 Iron deficiency: Secondary | ICD-10-CM | POA: Insufficient documentation

## 2023-04-15 ENCOUNTER — Inpatient Hospital Stay: Payer: BC Managed Care – PPO | Attending: Hematology

## 2023-04-15 VITALS — BP 107/75 | HR 65 | Temp 98.9°F | Resp 16

## 2023-04-15 DIAGNOSIS — Z79890 Hormone replacement therapy (postmenopausal): Secondary | ICD-10-CM | POA: Diagnosis not present

## 2023-04-15 DIAGNOSIS — Z79899 Other long term (current) drug therapy: Secondary | ICD-10-CM | POA: Diagnosis not present

## 2023-04-15 DIAGNOSIS — N920 Excessive and frequent menstruation with regular cycle: Secondary | ICD-10-CM | POA: Insufficient documentation

## 2023-04-15 DIAGNOSIS — K5090 Crohn's disease, unspecified, without complications: Secondary | ICD-10-CM | POA: Insufficient documentation

## 2023-04-15 DIAGNOSIS — D72829 Elevated white blood cell count, unspecified: Secondary | ICD-10-CM | POA: Diagnosis present

## 2023-04-15 DIAGNOSIS — Z888 Allergy status to other drugs, medicaments and biological substances status: Secondary | ICD-10-CM | POA: Insufficient documentation

## 2023-04-15 DIAGNOSIS — D75839 Thrombocytosis, unspecified: Secondary | ICD-10-CM | POA: Insufficient documentation

## 2023-04-15 DIAGNOSIS — E611 Iron deficiency: Secondary | ICD-10-CM | POA: Diagnosis not present

## 2023-04-15 DIAGNOSIS — R5383 Other fatigue: Secondary | ICD-10-CM | POA: Insufficient documentation

## 2023-04-15 MED ORDER — CETIRIZINE HCL 10 MG PO TABS
10.0000 mg | ORAL_TABLET | Freq: Once | ORAL | Status: AC
  Administered 2023-04-15: 10 mg via ORAL
  Filled 2023-04-15: qty 1

## 2023-04-15 MED ORDER — SODIUM CHLORIDE 0.9 % IV SOLN: INTRAVENOUS | Status: DC

## 2023-04-15 MED ORDER — SODIUM CHLORIDE 0.9 % IV SOLN
510.0000 mg | Freq: Once | INTRAVENOUS | Status: AC
  Administered 2023-04-15: 510 mg via INTRAVENOUS
  Filled 2023-04-15: qty 510

## 2023-04-15 MED ORDER — ACETAMINOPHEN 325 MG PO TABS
650.0000 mg | ORAL_TABLET | Freq: Once | ORAL | Status: AC
  Administered 2023-04-15: 650 mg via ORAL
  Filled 2023-04-15: qty 2

## 2023-04-15 NOTE — Progress Notes (Signed)
 Reviewed iron infusion and information given for review.  All questions asked and answered.   Patient tolerated iron infusion with no complaints voiced.  Peripheral IV site clean and dry with good blood return noted before and after infusion.  Band aid applied.  VSS with discharge and left in satisfactory condition with no s/s of distress noted.

## 2023-04-15 NOTE — Patient Instructions (Signed)

## 2023-04-16 ENCOUNTER — Other Ambulatory Visit: Payer: Self-pay | Admitting: Gastroenterology

## 2023-04-16 DIAGNOSIS — K743 Primary biliary cirrhosis: Secondary | ICD-10-CM

## 2023-04-16 LAB — FLOW CYTOMETRY

## 2023-04-17 LAB — CALR +MPL + E12-E15  (REFLEX)

## 2023-04-17 LAB — JAK2 V617F RFX CALR/MPL/E12-15

## 2023-04-18 ENCOUNTER — Telehealth: Payer: Self-pay | Admitting: Gastroenterology

## 2023-04-18 DIAGNOSIS — K50818 Crohn's disease of both small and large intestine with other complication: Secondary | ICD-10-CM

## 2023-04-18 NOTE — Telephone Encounter (Signed)
 Patient called and stated that her insurance told her that they where not going to pay for her Cristy Folks and she would have to pay 100% for her medication and they are going to charge her 2100 for the [place]. Patient is requesting a call back. Please advise.

## 2023-04-18 NOTE — Telephone Encounter (Signed)
 Hello, is someone able to assist with message below regarding cost of Skyrizi? Is patient eligible for assistance?

## 2023-04-22 ENCOUNTER — Other Ambulatory Visit (HOSPITAL_COMMUNITY): Payer: Self-pay

## 2023-04-22 NOTE — Telephone Encounter (Signed)
 Secure chat has been sent to [name] to follow up on message that was sent to RX prior auth pool last week. Appeal was approved, patient will have to pay 100% of cost. I am waiting on a response to see if patient is eligible for free drug or patient assistance.

## 2023-04-22 NOTE — Telephone Encounter (Signed)
 Patient called again and stated that she was prescribed Skyrizi and had already been denied by her insurance and they denied Dr. Adela Lank  appeal as well. She has not been able to take they [facility] Dr. Harland Dingwall has switched her too and is now wanting to know what her next step are since she just can not afford this medication. Patient is requesting a call back. Please advise.

## 2023-04-23 ENCOUNTER — Other Ambulatory Visit (HOSPITAL_COMMUNITY): Payer: Self-pay

## 2023-04-24 ENCOUNTER — Ambulatory Visit: Payer: BC Managed Care – PPO | Admitting: Oncology

## 2023-04-24 ENCOUNTER — Other Ambulatory Visit (HOSPITAL_COMMUNITY): Payer: Self-pay

## 2023-04-25 ENCOUNTER — Inpatient Hospital Stay: Payer: BC Managed Care – PPO

## 2023-04-25 ENCOUNTER — Inpatient Hospital Stay (HOSPITAL_BASED_OUTPATIENT_CLINIC_OR_DEPARTMENT_OTHER): Payer: BC Managed Care – PPO | Admitting: Oncology

## 2023-04-25 VITALS — BP 103/35 | HR 80 | Temp 98.3°F | Resp 18 | Wt 285.0 lb

## 2023-04-25 VITALS — BP 98/71 | HR 80 | Temp 98.2°F | Resp 18

## 2023-04-25 DIAGNOSIS — D75839 Thrombocytosis, unspecified: Secondary | ICD-10-CM | POA: Diagnosis not present

## 2023-04-25 DIAGNOSIS — E611 Iron deficiency: Secondary | ICD-10-CM

## 2023-04-25 DIAGNOSIS — D72829 Elevated white blood cell count, unspecified: Secondary | ICD-10-CM | POA: Diagnosis not present

## 2023-04-25 DIAGNOSIS — K50818 Crohn's disease of both small and large intestine with other complication: Secondary | ICD-10-CM | POA: Diagnosis not present

## 2023-04-25 MED ORDER — SODIUM CHLORIDE 0.9 % IV SOLN
510.0000 mg | Freq: Once | INTRAVENOUS | Status: AC
  Administered 2023-04-25: 510 mg via INTRAVENOUS
  Filled 2023-04-25: qty 510

## 2023-04-25 MED ORDER — CETIRIZINE HCL 10 MG PO TABS
10.0000 mg | ORAL_TABLET | Freq: Once | ORAL | Status: AC
Start: 2023-04-25 — End: 2023-04-25
  Administered 2023-04-25: 10 mg via ORAL
  Filled 2023-04-25: qty 1

## 2023-04-25 MED ORDER — SODIUM CHLORIDE 0.9 % IV SOLN: INTRAVENOUS | Status: DC

## 2023-04-25 MED ORDER — ACETAMINOPHEN 325 MG PO TABS
650.0000 mg | ORAL_TABLET | Freq: Once | ORAL | Status: AC
  Administered 2023-04-25: 650 mg via ORAL
  Filled 2023-04-25: qty 2

## 2023-04-25 NOTE — Progress Notes (Signed)
 [facility] at [facility] HEMATOLOGY FOLLOW-UP VISIT  [place], [place], [place]  REASON FOR FOLLOW-UP: Thrombocytosis  ASSESSMENT & PLAN:  Send is a 38 year old female with past medical history of Crohn's disease following for thrombocytosis.  Leukocytosis  Likely secondary to chronic inflammation. Flow cytometry negative.  Resolved at this time.   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  Likely also a component of medication induced by [employer].  Patient has severe iron deficiency evident on previous labs.  No evidence of thrombosis at this time.  Started on IV iron infusion and is due for his second dose today. -Continue to monitor counts -Will repeat labs in 6 weeks and assess for response  Inflammatory bowel disease (Crohn's disease) (HCC) Continue to follow with GI.  On Skyrizi currently  Iron deficiency Severe iron deficiency likely secondary to chronic inflammatory bowel disease and heavy menstrual bleeding.  Improvement in fatigue since the first IV iron infusion.  Due for her second infusion today. - Administer iron infusion today. - Advise oral iron supplementation at home every other day. - Encourage intake of protein and green leafy vegetables. -Continue to follow with GI -Patient has a GYN but wishes to see a new GYN for menorrhagia.  Schedule follow-up in six weeks to repeat labs and assess iron levels.   Orders Placed This Encounter  Procedures   Ferritin    Standing Status:   Future    Expected Date:   06/02/2023    Expiration Date:   04/24/2024   Folate    Standing Status:   Future    Expected Date:   06/02/2023    Expiration Date:   04/24/2024   Vitamin B12    Standing Status:   Future    Expected Date:   06/02/2023    Expiration Date:   04/24/2024   CBC with Differential/Platelet    Standing Status:   Future    Expected Date:   06/02/2023    Expiration Date:   04/24/2024   Comprehensive metabolic panel     Standing Status:   Future    Expected Date:   06/02/2023    Expiration Date:   04/24/2024   Iron and TIBC    Standing Status:   Future    Expected Date:   06/02/2023    Expiration Date:   04/24/2024    The total time spent in the appointment was 20 minutes encounter with patients including review of chart and various tests results, discussions about plan of care and coordination of care plan  All questions were answered. The patient knows to call the clinic with any problems, questions or concerns. No barriers to learning was detected.  Cindie Crumbly, MD 3/14/20251:58 PM   SUMMARY OF HEMATOLOGIC HISTORY: -Thrombocytosis likely secondary to iron deficiency and medication induced by Cristy Folks -S/p IV Feraheme first dose on 04/15/2023   INTERVAL HISTORY: Neita Goodnight 38 y.o. female following for thrombocytosis and iron deficiency.  Patient reports some fatigue and brain fog but reports significant improvement after the first IV iron infusion.  She was report occasional heavy menstrual bleeding and was seen by GYN prior to this and was advised birth control.  Patient is planning to see a new GYN for the next visit to address this.  She has no other complaints today.  She is due for her second IV iron infusion and tolerated first one very well.  I have reviewed the past medical history, past surgical history, social history and  family history with the patient   ALLERGIES:  is allergic to imitrex [sumatriptan], maxalt [rizatriptan], phenergan [promethazine hcl], and tape.  MEDICATIONS:  Current Outpatient Medications  Medication Sig Dispense Refill   Calcium Carbonate-Vit D-Min (CALCIUM 1200) 1200-1000 MG-UNIT CHEW Chew 1 tablet by mouth daily. 30 tablet 0   calcium-vitamin D (OSCAL WITH D) 500-200 MG-UNIT tablet Take 1 tablet by mouth daily with breakfast. 90 tablet 1   Cholecalciferol (VITAMIN D3) 125 MCG (5000 UT) CAPS Take 1 capsule (5,000 Units total) by mouth daily. 30 capsule 0    cholecalciferol (VITAMIN D3) 25 MCG (1000 UNIT) tablet Take 1,000 Units by mouth daily.     dicyclomine (BENTYL) 10 MG capsule Take 1 capsule (10 mg total) by mouth every 12 (twelve) hours as needed for spasms. 60 capsule 1   ferrous sulfate 324 MG TBEC Take 324 mg by mouth every other day.     levothyroxine (SYNTHROID) 150 MCG tablet Take 150 mcg by mouth daily before breakfast.     norelgestromin-ethinyl estradiol Burr Medico) 150-35 MCG/24HR transdermal patch Place 1 patch onto the skin once a week. 9 patch 4   OVER THE COUNTER MEDICATION Take 200 mg by mouth daily. Magnesium     Risankizumab-rzaa (SKYRIZI) 360 MG/2.4ML SOCT Inject 1 Cartridge into the skin every 8 (eight) weeks. 2.4 mL 5   ursodiol (ACTIGALL) 300 MG capsule TAKE TWO CAPSULES BY MOUTH THREE TIMES DAILY 540 capsule 1   Vitamin D, Ergocalciferol, (DRISDOL) 1.25 MG (50000 UNIT) CAPS capsule Take 50,000 Units by mouth every 7 (seven) days.     No current facility-administered medications for this visit.   Facility-Administered Medications Ordered in Other Visits  Medication Dose Route Frequency Provider Last Rate Last Admin   0.9 %  sodium chloride infusion   Intravenous Continuous Cindie Crumbly, MD   Stopped at 04/25/23 1103     REVIEW OF SYSTEMS:   Constitutional: Denies fevers, chills or night sweats Eyes: Denies blurriness of vision Ears, nose, mouth, throat, and face: Denies mucositis or sore throat Respiratory: Denies cough, dyspnea or wheezes Cardiovascular: Denies palpitation, chest discomfort or lower extremity swelling Gastrointestinal:  Denies nausea, heartburn or change in bowel habits Skin: Denies abnormal skin rashes Lymphatics: Denies new lymphadenopathy or easy bruising Neurological:Denies numbness, tingling or new weaknesses Behavioral/Psych: Mood is stable, no new changes  All other systems were reviewed with the patient and are negative.  PHYSICAL EXAMINATION:   Vitals:   04/25/23 0920  BP: (!)  103/35  Pulse: 80  Resp: 18  Temp: 98.3 F (36.8 C)  SpO2: 100%    GENERAL:alert, no distress and comfortable LUNGS: clear to auscultation and percussion with normal breathing effort HEART: regular rate & rhythm and no murmurs and no lower extremity edema ABDOMEN:abdomen soft, non-tender and normal bowel sounds Musculoskeletal:no cyanosis of digits and no clubbing  NEURO: alert & oriented x 3 with fluent speech  LABORATORY DATA:  I have reviewed the data as listed  Lab Results  Component Value Date   WBC 9.9 04/10/2023   NEUTROABS 4.9 04/10/2023   HGB 12.8 04/10/2023   HCT 40.1 04/10/2023   MCV 84.8 04/10/2023   PLT 651 (H) 04/10/2023       Chemistry      Component Value Date/Time   NA 139 04/10/2023 1046   NA 139 12/20/2017 1043   K 4.1 04/10/2023 1046   CL 105 04/10/2023 1046   CO2 24 04/10/2023 1046   BUN 11 04/10/2023 1046   BUN  4 (L) 12/20/2017 1043   CREATININE 0.67 04/10/2023 1046   CREATININE 0.53 04/27/2020 1108      Component Value Date/Time   CALCIUM 9.5 04/10/2023 1046   ALKPHOS 200 (H) 04/10/2023 1046   AST 39 04/10/2023 1046   ALT 32 04/10/2023 1046   BILITOT 0.7 04/10/2023 1046   BILITOT 0.4 12/20/2017 1043       04/10/23 10:46  CALR +MPL + E12-E15  (REFLEX) Negative  JAK2 V617F RFX CALR/MPL/E12-15 Negative  (C): Corrected Rpt: View report in Results Review for more information   Latest Reference Range & Units 04/10/23 10:46  Sed Rate 0 - 22 mm/hr 37 (H)  (H): Data is abnormally high   Latest Reference Range & Units 04/10/23 10:46  Iron 28 - 170 ug/dL 27 (L)  UIBC ug/dL 161  TIBC 096 - 045 ug/dL 409  Saturation Ratios 10.4 - 31.8 % 7 (L)  Ferritin 11 - 307 ng/mL 8 (L)  Folate >5.9 ng/mL 10.6  CRP <1.0 mg/dL 2.0 (H)  Vitamin W11 914 - 914 pg/mL 340  (L): Data is abnormally low (H): Data is abnormally high  Flow cytometry: 04/10/23  DIAGNOSIS:   Peripheral blood, flow cytometry:  -  No immunophenotypic evidence of a  lymphoproliferative disorder (i.e.  no monoclonal B cells or immunophenotypically abnormal T cells  detected).   GATING AND PHENOTYPIC ANALYSIS:   Gated population: Flow cytometric immunophenotyping is performed using  antibodies to the antigens listed in the table below. Electronic gates  are placed around a cell cluster displaying light scatter properties  corresponding to: lymphocytes   Abnormal Cells in gated population: N/A   Phenotype of Abnormal Cells: N/A

## 2023-04-25 NOTE — Assessment & Plan Note (Addendum)
 Likely secondary to chronic inflammation. Flow cytometry negative.  Resolved at this time.

## 2023-04-25 NOTE — Assessment & Plan Note (Signed)
 Continue to follow with GI.  On Skyrizi currently

## 2023-04-25 NOTE — Progress Notes (Signed)
 Patient tolerated iron infusion with no complaints voiced.  Peripheral IV site clean and dry with good blood return noted before and after infusion.  Band aid applied. Pt observed for 20 minutes post Feraheme infusion per pt request without any complications.  VSS with discharge and left in satisfactory condition with no s/s of distress noted. All follow ups as scheduled.   Annalee Meyerhoff [employer]

## 2023-04-25 NOTE — Assessment & Plan Note (Signed)
 Severe iron deficiency likely secondary to chronic inflammatory bowel disease and heavy menstrual bleeding.  Improvement in fatigue since the first IV iron infusion.  Due for her second infusion today. - Administer iron infusion today. - Advise oral iron supplementation at home every other day. - Encourage intake of protein and green leafy vegetables. -Continue to follow with GI -Patient has a GYN but wishes to see a new GYN for menorrhagia.  Schedule follow-up in six weeks to repeat labs and assess iron levels.

## 2023-04-25 NOTE — Patient Instructions (Signed)

## 2023-04-25 NOTE — Assessment & Plan Note (Signed)
 Thrombocytosis likely secondary to iron deficiency.  Likely also a component of medication induced by [employer].  Patient has severe iron deficiency evident on previous labs.  No evidence of thrombosis at this time.  Started on IV iron infusion and is due for his second dose today. -Continue to monitor counts -Will repeat labs in 6 weeks and assess for response

## 2023-04-25 NOTE — Patient Instructions (Signed)
 VISIT SUMMARY:  During today's visit, we discussed your ongoing issues with iron deficiency, Crohn's disease, heavy menstrual bleeding, and borderline low vitamin B12 levels. We reviewed your recent lab results and symptoms, and we have developed a plan to address each of these concerns.  YOUR PLAN:  -IRON DEFICIENCY: Iron deficiency means your body does not have enough iron, which is essential for making red blood cells. This can cause symptoms like fatigue and brain fog. Today, you will receive an iron infusion, and we will follow up in six weeks to check your iron levels. You should also take oral iron supplements every other day and eat more protein and green leafy vegetables.  -CROHN'S DISEASE: Crohn's disease is a chronic inflammatory condition of the digestive tract. It can cause elevated inflammatory markers and contribute to iron deficiency. We will continue to monitor your condition with regular colonoscopies.  -HEAVY MENSTRUAL BLEEDING: Heavy menstrual bleeding, or menorrhagia, can lead to significant blood loss and contribute to iron deficiency. Since you are not interested in using birth control, we recommend consulting with a different gynecologist for further evaluation and management.  -VITAMIN B12 DEFICIENCY: Vitamin B12 deficiency can cause fatigue and low energy levels. We recommend taking vitamin B12 supplements daily, either through a prescription or over-the-counter options.  INSTRUCTIONS:  Please schedule a follow-up appointment in six weeks to repeat your labs and assess your iron levels. Additionally, consider consulting with a different gynecologist for your heavy menstrual bleeding.

## 2023-04-28 ENCOUNTER — Other Ambulatory Visit (HOSPITAL_COMMUNITY): Payer: Self-pay

## 2023-04-28 NOTE — Telephone Encounter (Signed)
 Sorry for delay. Pt is not eligible for patient assistance due to having medicaid as well. Call was placed to patient on 3.11.25 and 3.12.25 regarding follow up with [facility]. Several calls made to [facility] and [employer]. Call again made to [facility] and they still do not have a record of this patient. Please send a new script to [facility] (not [facility]) with pt insurance information on script. I was unable to provide any insurance information to [facility] because they do not have in the system.   [facility] BIN: [id number] PCN: [id number] GRP: [id number] ID: [id number]  [facility] Medicaid [id number]:[id number] PCN: [id number] GRP: [id number] ID: [id number]

## 2023-04-29 ENCOUNTER — Encounter: Payer: Self-pay | Admitting: Gastroenterology

## 2023-04-29 MED ORDER — SKYRIZI 360 MG/2.4ML ~~LOC~~ SOCT: 1.0000 | SUBCUTANEOUS | 5 refills | Status: DC

## 2023-04-29 NOTE — Telephone Encounter (Signed)
 New prescription has been sent to [facility] along with insurance information that you provided. Thanks  Also, please send any future correspondences for this patient to [facility] POD A Triage

## 2023-05-13 ENCOUNTER — Telehealth: Payer: Self-pay

## 2023-05-13 DIAGNOSIS — Z79899 Other long term (current) drug therapy: Secondary | ICD-10-CM

## 2023-05-13 DIAGNOSIS — K50818 Crohn's disease of both small and large intestine with other complication: Secondary | ICD-10-CM

## 2023-05-13 NOTE — Telephone Encounter (Signed)
-----   Message from [facility] sent at 04/01/2023  2:17 PM EST ----- Regarding: fecal cal in April Patient due for fecal calprotectin in April.  MyChart patient and put in order

## 2023-05-13 NOTE — Telephone Encounter (Signed)
 Order for fecal calprotectin placed. MyChart message to patient sent

## 2023-05-14 NOTE — Telephone Encounter (Signed)
 Pt sent a message that she has been trying to get her skyrizi on body injector

## 2023-05-14 NOTE — Telephone Encounter (Signed)
 Please check on the status of pts skyrizi injections. She ststes she has been trying to get it from the pharmacy for over a month. Pt is calling.

## 2023-05-15 ENCOUNTER — Encounter: Payer: Self-pay | Admitting: Oncology

## 2023-05-15 ENCOUNTER — Other Ambulatory Visit (HOSPITAL_COMMUNITY): Payer: Self-pay

## 2023-05-16 ENCOUNTER — Other Ambulatory Visit (HOSPITAL_COMMUNITY): Payer: Self-pay

## 2023-05-16 ENCOUNTER — Encounter: Payer: Self-pay | Admitting: Oncology

## 2023-05-16 ENCOUNTER — Other Ambulatory Visit: Payer: Self-pay | Admitting: Pharmacy Technician

## 2023-05-16 ENCOUNTER — Other Ambulatory Visit: Payer: Self-pay

## 2023-05-16 DIAGNOSIS — K50818 Crohn's disease of both small and large intestine with other complication: Secondary | ICD-10-CM

## 2023-05-16 MED ORDER — SKYRIZI 360 MG/2.4ML ~~LOC~~ SOCT
1.0000 | SUBCUTANEOUS | 5 refills | Status: DC
Start: 2023-05-16 — End: 2023-09-10
  Filled 2023-05-20: qty 2.4, 30d supply, fill #0
  Filled 2023-07-03: qty 2.4, 30d supply, fill #1
  Filled 2023-09-05: qty 2.4, 30d supply, fill #2

## 2023-05-20 ENCOUNTER — Other Ambulatory Visit: Payer: Self-pay

## 2023-05-20 ENCOUNTER — Other Ambulatory Visit (HOSPITAL_COMMUNITY): Payer: Self-pay

## 2023-05-20 NOTE — Progress Notes (Signed)
 Initial fill has been queued in [place].

## 2023-05-20 NOTE — Progress Notes (Signed)
 Specialty Pharmacy Initial Fill Coordination Note  [name] is a 38 y.o. female contacted today regarding initial fill of specialty medication(s) Merlyn Albert Cristy Folks)   Patient requested Delivery   Delivery date: 05/22/23   Verified address: [address]   [place] [address]   Medication will be filled on 4.9.25.   Patient is aware of $4 copayment.   --MUST BILL AT 30 DAY SUPPLY--

## 2023-05-20 NOTE — Progress Notes (Signed)
 Specialty Pharmacy Initiation Note   [name] is a 38 y.o. female who will be followed by the specialty pharmacy service for RxSp Crohn's Disease    Review of administration, indication, effectiveness, safety, potential side effects, storage/disposable, and missed dose instructions occurred today for patient's specialty medication(s) Risankizumab-rzaa Cristy Folks)     Patient/Caregiver asked additional questions regarding the time between start date of IV therapy and initiation of SQ therapy. Patient had IV therapy 02/06/23 and had to delay starting SQ therapy due to insurance approval.  Her doctor advised her to go ahead and start the SQ therapy despite the delay.  I advised her to follow her MD's direction.  Patient's therapy is appropriate to: Initiate    Goals Addressed             This Visit's Progress    Stabilization of disease       Patient is initiating therapy. Patient will maintain adherence         Servando Snare Specialty Pharmacist

## 2023-05-22 ENCOUNTER — Other Ambulatory Visit (HOSPITAL_COMMUNITY): Payer: Self-pay

## 2023-05-22 ENCOUNTER — Other Ambulatory Visit: Payer: Self-pay

## 2023-05-23 ENCOUNTER — Other Ambulatory Visit: Payer: Self-pay

## 2023-05-26 ENCOUNTER — Other Ambulatory Visit (HOSPITAL_COMMUNITY): Payer: Self-pay

## 2023-05-30 ENCOUNTER — Inpatient Hospital Stay: Attending: Hematology

## 2023-05-30 ENCOUNTER — Other Ambulatory Visit: Payer: Self-pay

## 2023-05-30 DIAGNOSIS — Z79899 Other long term (current) drug therapy: Secondary | ICD-10-CM | POA: Diagnosis not present

## 2023-05-30 DIAGNOSIS — E538 Deficiency of other specified B group vitamins: Secondary | ICD-10-CM | POA: Insufficient documentation

## 2023-05-30 DIAGNOSIS — D75839 Thrombocytosis, unspecified: Secondary | ICD-10-CM

## 2023-05-30 DIAGNOSIS — K50818 Crohn's disease of both small and large intestine with other complication: Secondary | ICD-10-CM

## 2023-05-30 DIAGNOSIS — D72820 Lymphocytosis (symptomatic): Secondary | ICD-10-CM

## 2023-05-30 DIAGNOSIS — E611 Iron deficiency: Secondary | ICD-10-CM

## 2023-05-30 DIAGNOSIS — D72829 Elevated white blood cell count, unspecified: Secondary | ICD-10-CM | POA: Diagnosis present

## 2023-05-30 DIAGNOSIS — D509 Iron deficiency anemia, unspecified: Secondary | ICD-10-CM | POA: Diagnosis present

## 2023-05-30 LAB — COMPREHENSIVE METABOLIC PANEL WITH GFR
ALT: 60 U/L — ABNORMAL HIGH (ref 0–44)
AST: 38 U/L (ref 15–41)
Albumin: 3.5 g/dL (ref 3.5–5.0)
Alkaline Phosphatase: 320 U/L — ABNORMAL HIGH (ref 38–126)
Anion gap: 6 (ref 5–15)
BUN: 12 mg/dL (ref 6–20)
CO2: 22 mmol/L (ref 22–32)
Calcium: 8.8 mg/dL — ABNORMAL LOW (ref 8.9–10.3)
Chloride: 106 mmol/L (ref 98–111)
Creatinine, Ser: 0.64 mg/dL (ref 0.44–1.00)
GFR, Estimated: >60 mL/min (ref >60)
Glucose, Bld: 97 mg/dL (ref 70–99)
Potassium: 3.9 mmol/L (ref 3.5–5.1)
Sodium: 134 mmol/L — ABNORMAL LOW (ref 135–145)
Total Bilirubin: 0.7 mg/dL (ref 0.0–1.2)
Total Protein: 7.2 g/dL (ref 6.5–8.1)

## 2023-05-30 LAB — CBC WITH DIFFERENTIAL/PLATELET
Abs Immature Granulocytes: 0.03 10*3/uL (ref 0.00–0.07)
Basophils Absolute: 0.1 10*3/uL (ref 0.0–0.1)
Basophils Relative: 1 %
Eosinophils Absolute: 0.2 10*3/uL (ref 0.0–0.5)
Eosinophils Relative: 2 %
HCT: 42.4 % (ref 36.0–46.0)
Hemoglobin: 13.4 g/dL (ref 12.0–15.0)
Immature Granulocytes: 0 %
Lymphocytes Relative: 42 %
Lymphs Abs: 4.6 10*3/uL — ABNORMAL HIGH (ref 0.7–4.0)
MCH: 28.7 pg (ref 26.0–34.0)
MCHC: 31.6 g/dL (ref 30.0–36.0)
MCV: 90.8 fL (ref 80.0–100.0)
Monocytes Absolute: 0.7 10*3/uL (ref 0.1–1.0)
Monocytes Relative: 7 %
Neutro Abs: 5.3 10*3/uL (ref 1.7–7.7)
Neutrophils Relative %: 48 %
Platelets: 562 10*3/uL — ABNORMAL HIGH (ref 150–400)
RBC: 4.67 MIL/uL (ref 3.87–5.11)
RDW: 17.6 % — ABNORMAL HIGH (ref 11.5–15.5)
WBC: 11 10*3/uL — ABNORMAL HIGH (ref 4.0–10.5)
nRBC: 0 % (ref 0.0–0.2)

## 2023-05-30 LAB — FOLATE: Folate: 8 ng/mL (ref >5.9)

## 2023-05-30 LAB — IRON AND TIBC
Iron: 70 ug/dL (ref 28–170)
Saturation Ratios: 26 % (ref 10.4–31.8)
TIBC: 269 ug/dL (ref 250–450)
UIBC: 199 ug/dL

## 2023-05-30 LAB — VITAMIN B12: Vitamin B-12: 262 pg/mL (ref 180–914)

## 2023-05-30 LAB — GAMMA GT: GGT: 189 U/L — ABNORMAL HIGH (ref 7–50)

## 2023-05-30 LAB — FERRITIN: Ferritin: 78 ng/mL (ref 11–307)

## 2023-06-03 ENCOUNTER — Encounter: Payer: Self-pay | Admitting: Gastroenterology

## 2023-06-06 ENCOUNTER — Inpatient Hospital Stay (HOSPITAL_BASED_OUTPATIENT_CLINIC_OR_DEPARTMENT_OTHER): Admitting: Oncology

## 2023-06-06 VITALS — BP 124/84 | HR 82 | Temp 98.1°F | Resp 16 | Wt 275.4 lb

## 2023-06-06 DIAGNOSIS — K50818 Crohn's disease of both small and large intestine with other complication: Secondary | ICD-10-CM

## 2023-06-06 DIAGNOSIS — D72829 Elevated white blood cell count, unspecified: Secondary | ICD-10-CM

## 2023-06-06 DIAGNOSIS — E611 Iron deficiency: Secondary | ICD-10-CM | POA: Diagnosis not present

## 2023-06-06 DIAGNOSIS — D75839 Thrombocytosis, unspecified: Secondary | ICD-10-CM

## 2023-06-06 DIAGNOSIS — D509 Iron deficiency anemia, unspecified: Secondary | ICD-10-CM | POA: Diagnosis not present

## 2023-06-06 DIAGNOSIS — E538 Deficiency of other specified B group vitamins: Secondary | ICD-10-CM

## 2023-06-06 NOTE — Patient Instructions (Signed)
 VISIT SUMMARY:  During your follow-up visit, we discussed your progress with iron deficiency anemia, vitamin B12 levels, chronic inflammatory disease, liver enzyme elevation, and osteopenia. You have shown significant improvement in your iron levels and overall energy since starting treatment. However, your vitamin B12 levels have decreased, and we need to address this. We also reviewed your chronic inflammatory disease and its impact on your white blood cell and platelet counts, as well as your liver enzyme levels and osteopenia.  YOUR PLAN:  -IRON DEFICIENCY ANEMIA: Iron deficiency anemia occurs when your body doesn't have enough iron to produce adequate red blood cells. Your iron levels have improved significantly, and you have more energy. Continue taking your iron pills every other day. We will repeat your labs in three months to monitor your iron levels.  -VITAMIN B12 DEFICIENCY: Vitamin B12 deficiency means your body lacks enough vitamin B12, which is essential for nerve function and red blood cell production. Your B12 levels have decreased, likely due to inconsistent supplementation. Please take your vitamin B12 pills daily to improve your levels.  -CHRONIC INFLAMMATORY DISEASE: Chronic inflammatory disease can cause elevated white blood cell counts and platelet issues. Your platelet count has improved, likely due to your medication, Skyrizi . We will repeat your labs in three months to monitor your white blood cell count and platelet levels.   INSTRUCTIONS:  Please continue taking your iron pills every other day and your vitamin B12 pills daily. We will repeat your labs in three months to monitor your iron levels, white blood cell count, and platelet levels. Follow up with your gastroenterologist and hepatologist as recommended.

## 2023-06-06 NOTE — Assessment & Plan Note (Signed)
 Likely secondary to chronic inflammation.  Flow cytometry negative.    - Continue to monitor counts

## 2023-06-06 NOTE — Assessment & Plan Note (Signed)
 Severe iron deficiency likely secondary to chronic inflammatory bowel disease and heavy menstrual bleeding.   S/p IV iron with improvement in iron levels  -Follow-up with GYN and GI  Return to clinic in 3 months with labs to assess for further IV iron needs

## 2023-06-06 NOTE — Progress Notes (Signed)
 [facility] at [facility]  HEMATOLOGY FOLLOW-UP VISIT  [name], [name], [place]  REASON FOR FOLLOW-UP: Thrombocytosis  ASSESSMENT & PLAN:  Send is a 38 year old female with past medical history of Crohn's disease following for thrombocytosis.  Thrombocytosis Thrombocytosis likely secondary to iron deficiency and likely also a component of medication induced by Skyrizi .  No evidence of thrombosis at this time.  Negative JAK2/CALR/MPL/E12-15: Negative  S/p IV iron Stable platelet counts at this time.  - Continue to monitor platelets  Iron deficiency Severe iron deficiency likely secondary to chronic inflammatory bowel disease and heavy menstrual bleeding.   S/p IV iron with improvement in iron levels  -Follow-up with GYN and GI  Return to clinic in 3 months with labs to assess for further IV iron needs  Inflammatory bowel disease (Crohn's disease) (HCC) Continue to follow with GI.  On Skyrizi  currently  Leukocytosis  Likely secondary to chronic inflammation.  Flow cytometry negative.    - Continue to monitor counts  Vitamin B12 deficiency Mild vitamin B12 deficiencies with levels less than 490  - Continue vitamin B12 orally every day    Orders Placed This Encounter  Procedures   Ferritin    Standing Status:   Future    Expected Date:   09/01/2023    Expiration Date:   06/05/2024   Folate    Standing Status:   Future    Expected Date:   09/01/2023    Expiration Date:   06/05/2024   Vitamin B12    Standing Status:   Future    Expected Date:   09/01/2023    Expiration Date:   06/05/2024   CBC with Differential/Platelet    Standing Status:   Future    Expected Date:   09/01/2023    Expiration Date:   06/05/2024   Comprehensive metabolic panel with GFR    Standing Status:   Future    Expected Date:   09/01/2023    Expiration Date:   06/05/2024   Iron and TIBC    Standing Status:   Future    Expected Date:   09/01/2023    Expiration Date:    06/05/2024    The total time spent in the appointment was 20 minutes encounter with patients including review of chart and various tests results, discussions about plan of care and coordination of care plan  All questions were answered. The patient knows to call the clinic with any problems, questions or concerns. No barriers to learning was detected.  Eduardo Grade, MD 4/25/20252:15 PM   SUMMARY OF HEMATOLOGIC HISTORY: -Thrombocytosis likely secondary to iron deficiency and medication induced by Skyrizi  -S/p IV Feraheme 510 mg on 04/15/2023 and 04/25/2023   INTERVAL HISTORY: [name] 38 y.o. female following for thrombocytosis and iron deficiency. She has experienced increased energy levels, improved ability to sit still, and reduced need for naps since starting treatment for iron deficiency anemia. She is currently taking iron pills every other day.  She sometimes takes vitamin B12 pills but often forgets to take them. No fever, chills, weight loss, or loss of appetite. She feels better than during her initial visit.  She is under the care of a gastroenterologist and a hepatologist for elevated liver enzymes and alkaline phosphatase levels.  I have reviewed the past medical history, past surgical history, social history and family history with the patient   ALLERGIES:  is allergic to imitrex [sumatriptan], maxalt [rizatriptan], phenergan [promethazine hcl], and tape.  MEDICATIONS:  Current Outpatient Medications  Medication Sig Dispense Refill   Calcium  Carbonate-Vit D-Min (CALCIUM  1200) 1200-1000 MG-UNIT CHEW Chew 1 tablet by mouth daily. 30 tablet 0   calcium -vitamin D  (OSCAL WITH D) 500-200 MG-UNIT tablet Take 1 tablet by mouth daily with breakfast. 90 tablet 1   Cholecalciferol (VITAMIN D3) 125 MCG (5000 UT) CAPS Take 1 capsule (5,000 Units total) by mouth daily. 30 capsule 0   cholecalciferol (VITAMIN D3) 25 MCG (1000 UNIT) tablet Take 1,000 Units by mouth daily.      dicyclomine  (BENTYL ) 10 MG capsule Take 1 capsule (10 mg total) by mouth every 12 (twelve) hours as needed for spasms. 60 capsule 1   ferrous sulfate 324 MG TBEC Take 324 mg by mouth every other day.     levothyroxine  (SYNTHROID ) 150 MCG tablet Take 150 mcg by mouth daily before breakfast.     norelgestromin -ethinyl estradiol  (XULANE) 150-35 MCG/24HR transdermal patch Place 1 patch onto the skin once a week. 9 patch 4   OVER THE COUNTER MEDICATION Take 200 mg by mouth daily. Magnesium     Phentermine HCl 8 MG TABS Take once daily around noon     Risankizumab -rzaa (SKYRIZI ) 360 MG/2.4ML SOCT Inject 1 Cartridge into the skin every 8 (eight) weeks. 2.4 mL 5   topiramate (TOPAMAX) 15 MG capsule Take 15 mg by mouth 2 (two) times daily.     ursodiol  (ACTIGALL ) 300 MG capsule TAKE TWO CAPSULES BY MOUTH THREE TIMES DAILY 540 capsule 1   Vitamin D , Ergocalciferol , (DRISDOL) 1.25 MG (50000 UNIT) CAPS capsule Take 50,000 Units by mouth every 7 (seven) days.     No current facility-administered medications for this visit.     REVIEW OF SYSTEMS:   Constitutional: Denies fevers, chills or night sweats Eyes: Denies blurriness of vision Ears, nose, mouth, throat, and face: Denies mucositis or sore throat Respiratory: Denies cough, dyspnea or wheezes Cardiovascular: Denies palpitation, chest discomfort or lower extremity swelling Gastrointestinal:  Denies nausea, heartburn or change in bowel habits Skin: Denies abnormal skin rashes Lymphatics: Denies new lymphadenopathy or easy bruising Neurological:Denies numbness, tingling or new weaknesses Behavioral/Psych: Mood is stable, no new changes  All other systems were reviewed with the patient and are negative.  PHYSICAL EXAMINATION:   Vitals:   06/06/23 0941  BP: 124/84  Pulse: 82  Resp: 16  Temp: 98.1 F (36.7 C)  SpO2: 97%    GENERAL:alert, no distress and comfortable LUNGS: clear to auscultation and percussion with normal breathing  effort HEART: regular rate & rhythm and no murmurs and no lower extremity edema ABDOMEN:abdomen soft, non-tender and normal bowel sounds Musculoskeletal:no cyanosis of digits and no clubbing  NEURO: alert & oriented x 3 with fluent speech  LABORATORY DATA:  I have reviewed the data as listed  Lab Results  Component Value Date   WBC 11.0 (H) 05/30/2023   NEUTROABS 5.3 05/30/2023   HGB 13.4 05/30/2023   HCT 42.4 05/30/2023   MCV 90.8 05/30/2023   PLT 562 (H) 05/30/2023       Chemistry      Component Value Date/Time   NA 134 (L) 05/30/2023 0848   NA 139 12/20/2017 1043   K 3.9 05/30/2023 0848   CL 106 05/30/2023 0848   CO2 22 05/30/2023 0848   BUN 12 05/30/2023 0848   BUN 4 (L) 12/20/2017 1043   CREATININE 0.64 05/30/2023 0848   CREATININE 0.53 04/27/2020 1108      Component Value Date/Time   CALCIUM  8.8 (L) 05/30/2023  0848   ALKPHOS 320 (H) 05/30/2023 0848   AST 38 05/30/2023 0848   ALT 60 (H) 05/30/2023 0848   BILITOT 0.7 05/30/2023 0848   BILITOT 0.4 12/20/2017 1043       04/10/23 10:46  CALR +MPL + E12-E15  (REFLEX) Negative  JAK2 V617F RFX CALR/MPL/E12-15 Negative  (C): Corrected Rpt: View report in Results Review for more information   Latest Reference Range & Units 04/10/23 10:46  Sed Rate 0 - 22 mm/hr 37 (H)  (H): Data is abnormally high   Latest Reference Range & Units 05/30/23 08:47 05/30/23 08:48  Iron 28 - 170 ug/dL 70   UIBC ug/dL 865   TIBC 784 - 696 ug/dL 295   Saturation Ratios 10.4 - 31.8 % 26   Ferritin 11 - 307 ng/mL 78   Folate >5.9 ng/mL  8.0  Vitamin B12 180 - 914 pg/mL 262     Flow cytometry: 04/10/23  DIAGNOSIS:   Peripheral blood, flow cytometry:  -  No immunophenotypic evidence of a lymphoproliferative disorder (i.e.  no monoclonal B cells or immunophenotypically abnormal T cells  detected).   GATING AND PHENOTYPIC ANALYSIS:   Gated population: Flow cytometric immunophenotyping is performed using  antibodies to the  antigens listed in the table below. Electronic gates  are placed around a cell cluster displaying light scatter properties  corresponding to: lymphocytes   Abnormal Cells in gated population: N/A   Phenotype of Abnormal Cells: N/A

## 2023-06-06 NOTE — Assessment & Plan Note (Signed)
 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  No evidence of thrombosis at this time.  Negative JAK2/CALR/MPL/E12-15: Negative  S/p IV iron Stable platelet counts at this time.  - Continue to monitor platelets

## 2023-06-06 NOTE — Assessment & Plan Note (Signed)
 Mild vitamin B12 deficiencies with levels less than 490  - Continue vitamin B12 orally every day

## 2023-06-06 NOTE — Assessment & Plan Note (Signed)
 Continue to follow with GI.  On Skyrizi currently

## 2023-07-01 ENCOUNTER — Other Ambulatory Visit: Payer: Self-pay

## 2023-07-03 ENCOUNTER — Other Ambulatory Visit: Payer: Self-pay

## 2023-07-03 ENCOUNTER — Other Ambulatory Visit: Payer: Self-pay | Admitting: Pharmacy Technician

## 2023-07-03 NOTE — Progress Notes (Signed)
 Specialty Pharmacy Refill Coordination Note  [name] is a 38 y.o. female contacted today regarding refills of specialty medication(s) Risankizumab -rzaa (Skyrizi )   Patient requested Delivery   Delivery date: 07/15/23   Verified address: [address] [place] [address]   Medication will be filled on 07/14/23.

## 2023-07-14 ENCOUNTER — Other Ambulatory Visit: Payer: Self-pay

## 2023-07-30 ENCOUNTER — Other Ambulatory Visit

## 2023-07-30 DIAGNOSIS — K50818 Crohn's disease of both small and large intestine with other complication: Secondary | ICD-10-CM

## 2023-07-30 DIAGNOSIS — Z79899 Other long term (current) drug therapy: Secondary | ICD-10-CM

## 2023-08-01 LAB — CALPROTECTIN, FECAL: Calprotectin, Fecal: 658 ug/g — ABNORMAL HIGH (ref 0–120)

## 2023-08-06 ENCOUNTER — Ambulatory Visit: Payer: Self-pay | Admitting: Gastroenterology

## 2023-08-06 DIAGNOSIS — K50818 Crohn's disease of both small and large intestine with other complication: Secondary | ICD-10-CM

## 2023-08-08 MED ORDER — NA SULFATE-K SULFATE-MG SULF 17.5-3.13-1.6 GM/177ML PO SOLN: ORAL | 0 refills | Status: DC

## 2023-08-08 NOTE — Telephone Encounter (Signed)
 I have spoken to patient to advise of Dr Hassan recommendation to move forward with colonoscopy for objective assessment of current colon inflammation. She has scheduled appointment in [facility] for 09/04/23. She has been advised of time/date/location for upcoming procedure and has been given generalized verbal prep instructions. Discussed that a care partner 18 years or older should bring her, stay for the procedure and drive home due to sedation. Written instructions have been made available to the patient for additional review via mychart.

## 2023-09-04 ENCOUNTER — Ambulatory Visit: Admitting: Gastroenterology

## 2023-09-04 ENCOUNTER — Encounter: Payer: Self-pay | Admitting: Oncology

## 2023-09-04 ENCOUNTER — Encounter: Payer: Self-pay | Admitting: Gastroenterology

## 2023-09-04 VITALS — BP 92/68 | HR 84 | Temp 96.4°F | Resp 20 | Ht 64.0 in | Wt 273.6 lb

## 2023-09-04 DIAGNOSIS — K50818 Crohn's disease of both small and large intestine with other complication: Secondary | ICD-10-CM | POA: Diagnosis present

## 2023-09-04 DIAGNOSIS — D125 Benign neoplasm of sigmoid colon: Secondary | ICD-10-CM

## 2023-09-04 DIAGNOSIS — K5190 Ulcerative colitis, unspecified, without complications: Secondary | ICD-10-CM | POA: Diagnosis not present

## 2023-09-04 DIAGNOSIS — K529 Noninfective gastroenteritis and colitis, unspecified: Secondary | ICD-10-CM | POA: Diagnosis not present

## 2023-09-04 MED ORDER — BUDESONIDE ER 9 MG PO TB24: 9.0000 mg | ORAL_TABLET | Freq: Every day | ORAL | 1 refills | Status: DC

## 2023-09-04 MED ORDER — SODIUM CHLORIDE 0.9 % IV SOLN: 500.0000 mL | Freq: Once | INTRAVENOUS | Status: DC

## 2023-09-04 NOTE — Patient Instructions (Signed)

## 2023-09-04 NOTE — Progress Notes (Signed)
 Pt's states no medical or surgical changes since previsit or office visit.

## 2023-09-04 NOTE — Progress Notes (Signed)
 Vss nad trans to pacu

## 2023-09-04 NOTE — Progress Notes (Signed)
 [facility] History and Physical   Primary Care Physician:  Job Bolt, [place]   Reason for Procedure:   Crohn's colitis  Plan:    colonoscopy     HPI: [name] is a 38 y.o. female  here for colonoscopy to evaluate her Crohn's disease - on Skyrizi  every 8 weeks, fecal calprotectin improved from previous but remained in 600s on last check.   Patient has been feeling okay in regards to her Crohn's. Otherwise feels well without any cardiopulmonary symptoms.   I have discussed risks / benefits of anesthesia and endoscopic procedure with [name] and they wish to proceed with the exams as outlined today.    Past Medical History:  Diagnosis Date   Anemia    Breast nodule 10/10/2014   Breast pain 10/10/2014   BV (bacterial vaginosis) 11/12/2012   Crohn's disease (HCC)    Depression    Fatty liver    Hashimoto's disease    Hypothyroid 02/14/2015   Irregular menses 05/21/2013   Migraines    Obesity    Osteopenia 2021   Other and unspecified ovarian cyst 05/31/2013   Right complex ?cystic vs solid mass on ovary will schedule appt with [facility]   Pregnant 02/14/2015   Primary biliary cholangitis (HCC)    Thyroid  disease    Reported Hashimoto's Thyroiditis in past    Past Surgical History:  Procedure Laterality Date   BIOPSY  04/17/2018   Procedure: BIOPSY;  Surgeon: Golda Claudis PENNER, MD;  [facility]: [facility];  Service: Endoscopy;;  colon   BIOPSY  05/05/2020   Procedure: BIOPSY;  Surgeon: Eartha Angelia Sieving, MD;  Location: [facility];  Service: Gastroenterology;;   CESAREAN SECTION     COLONOSCOPY     COLONOSCOPY WITH PROPOFOL  N/A 04/17/2018   Procedure: COLONOSCOPY WITH PROPOFOL ;  Surgeon: Golda Claudis PENNER, MD;  Location: [facility];  Service: Endoscopy;  Laterality: N/A;  10:50   COLONOSCOPY WITH PROPOFOL  N/A 05/05/2020   Procedure: COLONOSCOPY WITH PROPOFOL ;  Surgeon: Eartha Angelia Sieving, MD;  Location: [facility];   Service: Gastroenterology;  Laterality: N/A;  AM   LIVER BIOPSY N/A 08/17/2018   Procedure: LIVER BIOPSY;  Surgeon: Kallie Manuelita BROCKS, MD;  Location: [facility];  Service: General;  Laterality: N/A;   OOPHORECTOMY Right 10/2013   TONSILLECTOMY     TUBAL LIGATION Bilateral 08/17/2018   Procedure: LAPAROSCOPIC BILATERAL TUBAL LIGATION WITH FALLOPE RINGS;  Surgeon: Edsel Norleen GAILS, MD;  Location: [facility];  Service: Gynecology;  Laterality: Bilateral;   VENTRAL HERNIA REPAIR N/A 08/17/2018   Procedure: LAPAROSCOPIC VENTRAL HERNIA WITH MESH;  Surgeon: Kallie Manuelita BROCKS, MD;  Location: [facility];  Service: General;  Laterality: N/A;   WISDOM TOOTH EXTRACTION      Prior to Admission medications   Medication Sig Start Date End Date Taking? Authorizing Provider  Calcium  Carbonate-Vit D-Min (CALCIUM  1200) 1200-1000 MG-UNIT CHEW Chew 1 tablet by mouth daily. 06/06/22  Yes Yahaira Bruski, Elspeth SQUIBB, MD  calcium -vitamin D  (OSCAL WITH D) 500-200 MG-UNIT tablet Take 1 tablet by mouth daily with breakfast. 01/25/20  Yes Eartha Angelia, Sieving, MD  Cholecalciferol (VITAMIN D3) 125 MCG (5000 UT) CAPS Take 1 capsule (5,000 Units total) by mouth daily. 06/06/22  Yes Arria Naim, Elspeth SQUIBB, MD  cholecalciferol (VITAMIN D3) 25 MCG (1000 UNIT) tablet Take 1,000 Units by mouth daily.   Yes [provider]  ferrous sulfate 324 MG TBEC Take 324 mg by mouth every other day. 04/10/23  Yes  [provider]  LIVDELZI 10 MG CAPS  06/06/23  Yes [provider]  OVER THE COUNTER MEDICATION Take 200 mg by mouth daily. Magnesium   Yes [provider]  Vitamin D , Ergocalciferol , (DRISDOL) 1.25 MG (50000 UNIT) CAPS capsule Take 50,000 Units by mouth every 7 (seven) days.   Yes [provider]  dicyclomine  (BENTYL ) 10 MG capsule Take 1 capsule (10 mg total) by mouth every 12 (twelve) hours as needed for spasms. 07/24/20   Castaneda Mayorga, Daniel, MD  levothyroxine  (SYNTHROID ) 150 MCG tablet Take 150  mcg by mouth daily before breakfast.    [provider]  norelgestromin -ethinyl estradiol  (XULANE) 150-35 MCG/24HR transdermal patch Place 1 patch onto the skin once a week. 03/20/23 06/18/23  Ozan, Jennifer, DO  Phentermine HCl 8 MG TABS Take once daily around noon 05/05/23   [provider]  Risankizumab -rzaa (SKYRIZI ) 360 MG/2.4ML SOCT Inject 1 Cartridge into the skin every 8 (eight) weeks. 05/16/23   Rosaleigh Brazzel, Elspeth SQUIBB, MD  topiramate (TOPAMAX) 15 MG capsule Take 15 mg by mouth 2 (two) times daily.    [provider]    Current Outpatient Medications  Medication Sig Dispense Refill   Calcium  Carbonate-Vit D-Min (CALCIUM  1200) 1200-1000 MG-UNIT CHEW Chew 1 tablet by mouth daily. 30 tablet 0   calcium -vitamin D  (OSCAL WITH D) 500-200 MG-UNIT tablet Take 1 tablet by mouth daily with breakfast. 90 tablet 1   Cholecalciferol (VITAMIN D3) 125 MCG (5000 UT) CAPS Take 1 capsule (5,000 Units total) by mouth daily. 30 capsule 0   cholecalciferol (VITAMIN D3) 25 MCG (1000 UNIT) tablet Take 1,000 Units by mouth daily.     ferrous sulfate 324 MG TBEC Take 324 mg by mouth every other day.     LIVDELZI 10 MG CAPS      OVER THE COUNTER MEDICATION Take 200 mg by mouth daily. Magnesium     Vitamin D , Ergocalciferol , (DRISDOL) 1.25 MG (50000 UNIT) CAPS capsule Take 50,000 Units by mouth every 7 (seven) days.     dicyclomine  (BENTYL ) 10 MG capsule Take 1 capsule (10 mg total) by mouth every 12 (twelve) hours as needed for spasms. 60 capsule 1   levothyroxine  (SYNTHROID ) 150 MCG tablet Take 150 mcg by mouth daily before breakfast.     norelgestromin -ethinyl estradiol  (XULANE) 150-35 MCG/24HR transdermal patch Place 1 patch onto the skin once a week. 9 patch 4   Phentermine HCl 8 MG TABS Take once daily around noon     Risankizumab -rzaa (SKYRIZI ) 360 MG/2.4ML SOCT Inject 1 Cartridge into the skin every 8 (eight) weeks. 2.4 mL 5   topiramate (TOPAMAX) 15 MG capsule Take 15 mg by mouth 2  (two) times daily.     Current Facility-Administered Medications  Medication Dose Route Frequency Provider Last Rate Last Admin   0.9 %  sodium chloride  infusion  500 mL Intravenous Once Kae Lauman, Elspeth SQUIBB, MD        Allergies as of 09/04/2023 - Review Complete 09/04/2023  Allergen Reaction Noted   Imitrex [sumatriptan] Other (See Comments) 09/10/2012   Maxalt [rizatriptan] Other (See Comments) 09/10/2012   Phenergan [promethazine hcl] Nausea And Vomiting 09/10/2012   Tape Other (See Comments) 10/12/2013    Family History  Problem Relation Age of Onset   Diabetes Mother    Hypertension Mother    Heart attack Mother    Stroke Mother    Seizures Father    Other Father        back problems  Cancer Maternal Aunt    Diabetes Maternal Uncle    Diabetes Maternal Grandmother    Hypertension Maternal Grandmother    Cancer Maternal Grandmother        breast   Diabetes Maternal Grandfather    Hypertension Maternal Grandfather    Leukemia Maternal Grandfather    AAA (abdominal aortic aneurysm) Maternal Grandfather    Dementia Paternal Grandmother    Kidney disease Paternal Grandfather    Colon cancer Cousin    Ovarian cancer Cousin    Stomach cancer Other    Ulcerative colitis Neg Hx    Crohn's disease Neg Hx    Esophageal cancer Neg Hx    Rectal cancer Neg Hx     Social History   Socioeconomic History   Marital status: Married    Spouse name: Not on file   Number of children: 4   Years of education: Not on file   Highest education level: Not on file  Occupational History   Not on file  Tobacco Use   Smoking status: Former    Current packs/day: 0.00    Average packs/day: 0.3 packs/day for 8.0 years (2.0 ttl pk-yrs)    Types: Cigarettes    Start date: 10/07/2002    Quit date: 10/07/2010    Years since quitting: 12.9   Smokeless tobacco: Never  Vaping Use   Vaping status: Never Used  Substance and Sexual Activity   Alcohol use: Yes    Comment: rare   Drug use:  No   Sexual activity: Yes    Birth control/protection: None, Condom  Other Topics Concern   Not on file  Social History Narrative   Not on file   Social Drivers of Health   Financial Resource Strain: Low Risk  (03/20/2023)   Overall Financial Resource Strain (CARDIA)    Difficulty of Paying Living Expenses: Not very hard  Food Insecurity: Food Insecurity Present (03/20/2023)   Hunger Vital Sign    Worried About Running Out of Food in the Last Year: Never true    Ran Out of Food in the Last Year: Sometimes true  Transportation Needs: No Transportation Needs (03/20/2023)   PRAPARE - Administrator, Civil Service (Medical): No    Lack of Transportation (Non-Medical): No  Physical Activity: Insufficiently Active (03/20/2023)   Exercise Vital Sign    Days of Exercise per Week: 2 days    Minutes of Exercise per Session: 30 min  Stress: No Stress Concern Present (03/20/2023)   [employer] of Occupational Health - Occupational Stress Questionnaire    Feeling of Stress : Not at all  Social Connections: Moderately Integrated (03/20/2023)   Social Connection and Isolation Panel    Frequency of Communication with Friends and Family: More than three times a week    Frequency of Social Gatherings with Friends and Family: Once a week    Attends Religious Services: More than 4 times per year    Active Member of [employer] or Organizations: No    Attends Banker Meetings: Never    Marital Status: Married  Catering manager Violence: Not At Risk (03/20/2023)   Humiliation, Afraid, Rape, and Kick questionnaire    Fear of Current or Ex-Partner: No    Emotionally Abused: No    Physically Abused: No    Sexually Abused: No    Review of Systems: All other review of systems negative except as mentioned in the HPI.  Physical Exam: Vital signs BP 120/79   Pulse  85   Temp (!) 96.4 F (35.8 C)   Ht 5' 4 (1.626 m)   Wt 273 lb 9.6 oz (124.1 kg)   SpO2 98%   BMI 46.96 kg/m    General:   Alert,  Well-developed, pleasant and cooperative in NAD Lungs:  Clear throughout to auscultation.   Heart:  Regular rate and rhythm Abdomen:  Soft, nontender and nondistended.   Neuro/Psych:  Alert and cooperative. Normal mood and affect. A and O x 3  Marcey Naval, MD [facility]

## 2023-09-04 NOTE — Op Note (Addendum)
 [facility] Patient Name: [name] Procedure Date: 09/04/2023 1:05 PM MRN: [id number] Endoscopist: Elspeth P. Leigh , MD, [id number] Age: 38 Referring MD:  Date of Birth: 1985-05-02 Gender: Female Account #: [id number] Procedure:                Colonoscopy Indications:              Disease activity assessment of Crohn's disease of                            the small bowel and colon - on Skyrizi  every 8                            weeks. Improved but not in clinical remission,                            fecal calprotectin > 600 last month. Has                            historically failed mesalamine  and Entyvio . Medicines:                Monitored Anesthesia Care Procedure:                Pre-Anesthesia Assessment:                           - Prior to the procedure, a History and Physical                            was performed, and patient medications and                            allergies were reviewed. The patient's tolerance of                            previous anesthesia was also reviewed. The risks                            and benefits of the procedure and the sedation                            options and risks were discussed with the patient.                            All questions were answered, and informed consent                            was obtained. Prior Anticoagulants: The patient has                            taken no anticoagulant or antiplatelet agents. ASA                            Grade Assessment: II - A patient with mild systemic  disease. After reviewing the risks and benefits,                            the patient was deemed in satisfactory condition to                            undergo the procedure.                           After obtaining informed consent, the colonoscope                            was passed under direct vision. Throughout the                            procedure, the patient's  blood pressure, pulse, and                            oxygen  saturations were monitored continuously. The                            Olympus Scope [id number] was introduced through the                            anus and advanced to the the terminal ileum, with                            identification of the appendiceal orifice and IC                            valve. The colonoscopy was performed without                            difficulty. The patient tolerated the procedure                            well. The quality of the bowel preparation was                            good. The terminal ileum, ileocecal valve,                            appendiceal orifice, and rectum were photographed. Scope In: 1:38:01 PM Scope Out: 1:48:37 PM Scope Withdrawal Time: 0 hours 9 minutes 29 seconds  Total Procedure Duration: 0 hours 10 minutes 36 seconds  Findings:                 The perianal and digital rectal examinations were                            normal.                           Mild inflammation was found in the terminal ileum.  Biopsies were taken with a cold forceps for                            histology.                           Inflammation was found in a continuous and                            circumferential pattern from the rectum to the                            cecum. This was graded as varying between Mayo 2                            and 3. Milder in the distal left colon. Biopsies                            were taken with a cold forceps for histology.                           A 5 mm polyp was found in the sigmoid colon. The                            polyp was sessile. Biopsies were taken with a cold                            forceps for histology. I did not remove it given                            significant inflammation, likely inflammatory                            polyp, but will rule out adenoma.                           The  exam was otherwise without abnormality. Of                            note, retroflexed views not obtained due to                            inflammatory changes. Complications:            No immediate complications. Estimated blood loss:                            Minimal. Estimated Blood Loss:     Estimated blood loss was minimal. Impression:               - Mild inflammation was found in the ileum                            secondary to Crohn's disease with ileitis. Biopsied.                           -  Moderate to severe colitis. Biopsied.                           - One 5 mm polyp in the sigmoid colon. Biopsied.                            Suspect inflammatory polyp.                           - The examination was otherwise normal.                           Unfortunately, she has active disease despite                            Skyrizi . Unclear if she would respond to higher                            dosing (q 4 weeks). Will discuss with her. May want                            to switch to Infliximab if no contraindications. Recommendation:           - Patient has a contact number available for                            emergencies. The signs and symptoms of potential                            delayed complications were discussed with the                            patient. Return to normal activities tomorrow.                            Written discharge instructions were provided to the                            patient.                           - Resume previous diet.                           - Continue present medications.                           - Await pathology results.                           - Consideration for starting Uceris  for flare, 9mg                             / day.                           -  Consideration for increasing Skyrizi  to q 4 weeks                            vs. switching (likely to infliximab / biosimilar +                             thiopurine), she is not a candidate for Rinvoq                            given she has not tried anti-TNF Elspeth P. Nylan Nakatani, MD 09/04/2023 2:00:17 PM This report has been signed electronically.

## 2023-09-04 NOTE — Progress Notes (Signed)
 Called to room to assist during endoscopic procedure.  Patient ID and intended procedure confirmed with present staff. Received instructions for my participation in the procedure from the performing physician.

## 2023-09-05 ENCOUNTER — Other Ambulatory Visit: Payer: Self-pay

## 2023-09-05 ENCOUNTER — Inpatient Hospital Stay

## 2023-09-05 ENCOUNTER — Telehealth: Payer: Self-pay

## 2023-09-05 NOTE — Telephone Encounter (Signed)
  Follow up Call-     09/04/2023    1:10 PM  Call back number  Post procedure Call Back phone  # [phone]  Permission to leave phone message Yes     Patient questions:  Do you have a fever, pain , or abdominal swelling? No. Pain Score  0 *  Have you tolerated food without any problems? Yes.    Have you been able to return to your normal activities? Yes.    Do you have any questions about your discharge instructions: Diet   No. Medications  No. Follow up visit  No.  Do you have questions or concerns about your Care? No.  Actions: * If pain score is 4 or above: No action needed, pain <4.

## 2023-09-08 ENCOUNTER — Inpatient Hospital Stay: Attending: Hematology

## 2023-09-08 ENCOUNTER — Telehealth: Payer: Self-pay

## 2023-09-08 ENCOUNTER — Other Ambulatory Visit: Payer: Self-pay | Admitting: *Deleted

## 2023-09-08 ENCOUNTER — Encounter: Payer: Self-pay | Admitting: Oncology

## 2023-09-08 ENCOUNTER — Other Ambulatory Visit (HOSPITAL_COMMUNITY): Payer: Self-pay

## 2023-09-08 DIAGNOSIS — E538 Deficiency of other specified B group vitamins: Secondary | ICD-10-CM | POA: Insufficient documentation

## 2023-09-08 DIAGNOSIS — D72820 Lymphocytosis (symptomatic): Secondary | ICD-10-CM

## 2023-09-08 DIAGNOSIS — D75839 Thrombocytosis, unspecified: Secondary | ICD-10-CM | POA: Insufficient documentation

## 2023-09-08 DIAGNOSIS — D509 Iron deficiency anemia, unspecified: Secondary | ICD-10-CM | POA: Diagnosis present

## 2023-09-08 DIAGNOSIS — E611 Iron deficiency: Secondary | ICD-10-CM

## 2023-09-08 DIAGNOSIS — Z79899 Other long term (current) drug therapy: Secondary | ICD-10-CM | POA: Diagnosis not present

## 2023-09-08 DIAGNOSIS — D72829 Elevated white blood cell count, unspecified: Secondary | ICD-10-CM | POA: Diagnosis present

## 2023-09-08 LAB — CBC WITH DIFFERENTIAL/PLATELET
Abs Immature Granulocytes: 0.03 K/uL (ref 0.00–0.07)
Basophils Absolute: 0.2 K/uL — ABNORMAL HIGH (ref 0.0–0.1)
Basophils Relative: 1 %
Eosinophils Absolute: 0.3 K/uL (ref 0.0–0.5)
Eosinophils Relative: 3 %
HCT: 38.8 % (ref 36.0–46.0)
Hemoglobin: 12.3 g/dL (ref 12.0–15.0)
Immature Granulocytes: 0 %
Lymphocytes Relative: 39 %
Lymphs Abs: 4.4 K/uL — ABNORMAL HIGH (ref 0.7–4.0)
MCH: 30.1 pg (ref 26.0–34.0)
MCHC: 31.7 g/dL (ref 30.0–36.0)
MCV: 94.9 fL (ref 80.0–100.0)
Monocytes Absolute: 0.6 K/uL (ref 0.1–1.0)
Monocytes Relative: 5 %
Neutro Abs: 5.9 K/uL (ref 1.7–7.7)
Neutrophils Relative %: 52 %
Platelets: 561 K/uL — ABNORMAL HIGH (ref 150–400)
RBC: 4.09 MIL/uL (ref 3.87–5.11)
RDW: 14.3 % (ref 11.5–15.5)
WBC: 11.4 K/uL — ABNORMAL HIGH (ref 4.0–10.5)
nRBC: 0 % (ref 0.0–0.2)

## 2023-09-08 LAB — IRON AND TIBC
Iron: 21 ug/dL — ABNORMAL LOW (ref 28–170)
Saturation Ratios: 7 % — ABNORMAL LOW (ref 10.4–31.8)
TIBC: 289 ug/dL (ref 250–450)
UIBC: 268 ug/dL

## 2023-09-08 LAB — COMPREHENSIVE METABOLIC PANEL WITH GFR
ALT: 48 U/L — ABNORMAL HIGH (ref 0–44)
AST: 77 U/L — ABNORMAL HIGH (ref 15–41)
Albumin: 3.2 g/dL — ABNORMAL LOW (ref 3.5–5.0)
Alkaline Phosphatase: 241 U/L — ABNORMAL HIGH (ref 38–126)
Anion gap: 7 (ref 5–15)
BUN: 9 mg/dL (ref 6–20)
CO2: 22 mmol/L (ref 22–32)
Calcium: 8.5 mg/dL — ABNORMAL LOW (ref 8.9–10.3)
Chloride: 109 mmol/L (ref 98–111)
Creatinine, Ser: 0.62 mg/dL (ref 0.44–1.00)
GFR, Estimated: >60 mL/min (ref >60)
Glucose, Bld: 88 mg/dL (ref 70–99)
Potassium: 4 mmol/L (ref 3.5–5.1)
Sodium: 138 mmol/L (ref 135–145)
Total Bilirubin: 0.5 mg/dL (ref 0.0–1.2)
Total Protein: 7 g/dL (ref 6.5–8.1)

## 2023-09-08 LAB — FERRITIN: Ferritin: 50 ng/mL (ref 11–307)

## 2023-09-08 LAB — TSH: TSH: 8.66 u[IU]/mL — ABNORMAL HIGH (ref 0.350–4.500)

## 2023-09-08 LAB — VITAMIN B12: Vitamin B-12: 254 pg/mL (ref 180–914)

## 2023-09-08 LAB — FOLATE: Folate: 6.7 ng/mL (ref >5.9)

## 2023-09-08 NOTE — Telephone Encounter (Signed)
 Pharmacy Patient Advocate Encounter   Received notification from CoverMyMeds that prior authorization for Budesonide  ER 9MG  er tablets is required/requested.   Insurance verification completed.   The patient is insured through [facility] .   Per test claim: PA required; PA submitted to above mentioned insurance via CoverMyMeds Key/confirmation #/EOC [id number] Status is pending

## 2023-09-09 ENCOUNTER — Other Ambulatory Visit (HOSPITAL_COMMUNITY): Payer: Self-pay

## 2023-09-09 LAB — T4: T4, Total: 8.6 ug/dL (ref 4.5–12.0)

## 2023-09-09 LAB — T3: T3, Total: 145 ng/dL (ref 71–180)

## 2023-09-09 NOTE — Telephone Encounter (Signed)
 Pharmacy Patient Advocate Encounter  Received notification from [facility] that Prior Authorization for Budesonide  ER 9MG  er tablets has been DENIED.  Full denial letter will be uploaded to the media tab. See denial reason below.  Per your health plan's criteria, this drug is covered if you meet the following:  The use of this drug is supported (for a Food and Drug Administration-approved indication or by an appropriate compendia of current literature): treatment of a type of intestinal disease (ulcerative colitis)  PA #/Case ID/Reference #: [id number]

## 2023-09-09 NOTE — Telephone Encounter (Signed)
 Okay sorry to hear this. I think prednisone  would work better to get her feeling better soon, however more side effects than budesonide . We can certainly try budesonide  for a few weeks and see if it helps, I think it will to some extent but likely less than the prednisone . Budesonide  much less side effects than prednisone . She has been on it before. Can you ask her what she would prefer and let me know? Once I have her biopsies back will need to discuss options with her for long term plan.

## 2023-09-10 ENCOUNTER — Other Ambulatory Visit: Payer: Self-pay | Admitting: Gastroenterology

## 2023-09-10 ENCOUNTER — Ambulatory Visit: Payer: Self-pay | Admitting: Gastroenterology

## 2023-09-10 DIAGNOSIS — Z79899 Other long term (current) drug therapy: Secondary | ICD-10-CM

## 2023-09-10 DIAGNOSIS — R7989 Other specified abnormal findings of blood chemistry: Secondary | ICD-10-CM

## 2023-09-10 DIAGNOSIS — K50818 Crohn's disease of both small and large intestine with other complication: Secondary | ICD-10-CM

## 2023-09-10 DIAGNOSIS — K760 Fatty (change of) liver, not elsewhere classified: Secondary | ICD-10-CM

## 2023-09-10 LAB — SURGICAL PATHOLOGY

## 2023-09-10 MED ORDER — BUDESONIDE 3 MG PO CPEP: 9.0000 mg | ORAL_CAPSULE | Freq: Every day | ORAL | 1 refills | Status: DC

## 2023-09-10 NOTE — Telephone Encounter (Signed)
 Got it, thank you Jan

## 2023-09-10 NOTE — Telephone Encounter (Signed)
 Called and spoke to patient.  She would prefer budesonide  3mg  tabs sent to [facility].  Script sent

## 2023-09-11 ENCOUNTER — Telehealth: Payer: Self-pay | Admitting: Pharmacy Technician

## 2023-09-11 NOTE — Telephone Encounter (Signed)
 error

## 2023-09-12 ENCOUNTER — Encounter: Payer: Self-pay | Admitting: Gastroenterology

## 2023-09-12 ENCOUNTER — Encounter: Payer: Self-pay | Admitting: Oncology

## 2023-09-12 ENCOUNTER — Inpatient Hospital Stay: Attending: Hematology | Admitting: Oncology

## 2023-09-12 VITALS — BP 108/66 | HR 70 | Temp 98.0°F | Resp 16 | Wt 280.2 lb

## 2023-09-12 DIAGNOSIS — N920 Excessive and frequent menstruation with regular cycle: Secondary | ICD-10-CM | POA: Diagnosis not present

## 2023-09-12 DIAGNOSIS — K5000 Crohn's disease of small intestine without complications: Secondary | ICD-10-CM | POA: Diagnosis not present

## 2023-09-12 DIAGNOSIS — D75839 Thrombocytosis, unspecified: Secondary | ICD-10-CM | POA: Diagnosis present

## 2023-09-12 DIAGNOSIS — K635 Polyp of colon: Secondary | ICD-10-CM | POA: Insufficient documentation

## 2023-09-12 DIAGNOSIS — Z888 Allergy status to other drugs, medicaments and biological substances status: Secondary | ICD-10-CM | POA: Diagnosis not present

## 2023-09-12 DIAGNOSIS — D72829 Elevated white blood cell count, unspecified: Secondary | ICD-10-CM | POA: Diagnosis present

## 2023-09-12 DIAGNOSIS — E538 Deficiency of other specified B group vitamins: Secondary | ICD-10-CM | POA: Insufficient documentation

## 2023-09-12 DIAGNOSIS — K50818 Crohn's disease of both small and large intestine with other complication: Secondary | ICD-10-CM | POA: Diagnosis not present

## 2023-09-12 DIAGNOSIS — Z7952 Long term (current) use of systemic steroids: Secondary | ICD-10-CM | POA: Diagnosis not present

## 2023-09-12 DIAGNOSIS — E611 Iron deficiency: Secondary | ICD-10-CM | POA: Diagnosis not present

## 2023-09-12 DIAGNOSIS — Z79890 Hormone replacement therapy (postmenopausal): Secondary | ICD-10-CM | POA: Insufficient documentation

## 2023-09-12 DIAGNOSIS — Z79899 Other long term (current) drug therapy: Secondary | ICD-10-CM | POA: Insufficient documentation

## 2023-09-12 DIAGNOSIS — R5383 Other fatigue: Secondary | ICD-10-CM | POA: Insufficient documentation

## 2023-09-12 NOTE — Progress Notes (Signed)
 [facility] at [facility]  HEMATOLOGY FOLLOW-UP VISIT  Job, [place], [place]  REASON FOR FOLLOW-UP: Thrombocytosis  ASSESSMENT & PLAN:  Send is a 38 year old female with past medical history of Crohn's disease following for thrombocytosis.  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  No evidence of thrombosis at this time.  Negative JAK2/CALR/MPL/E12-15: Negative  S/p IV iron Labs reviewed with stable platelet counts at this time but iron deficiency persists  - Will administer 2 more doses of IV iron.  - Continue to monitor platelets Iron deficiency Severe iron deficiency likely secondary to chronic inflammatory bowel disease and heavy menstrual bleeding.   S/p IV iron with improvement in iron levels Labs today consistent with iron deficiency without anemia Recent colonoscopy with active inflammation but no evidence of bleeding  -Will administer 2 more doses of IV iron.  Reemphasized side effects including allergic reactions, nausea and headache.   -Follow-up with GYN and GI - Continue oral iron every other day.  Return to clinic in 3 months with labs to assess for further IV iron needs.  Patient might need frequent iron infusions to keep up with the micro blood loss happening with chronic bowel disease Crohn's disease of both small and large intestine with other complication (HCC) Continue to follow with GI.  On Skyrizi  currently Recent colonoscopy with inflammation consistent with colitis Patient is in discussion with GI for alternative treatment options Leukocytosis, unspecified type Likely secondary to chronic inflammation.  Flow cytometry negative.    - Continue to monitor counts Vitamin B12 deficiency Mild vitamin B12 deficiencies with levels less than 400  - Continue vitamin B12 orally every day   Orders Placed This Encounter  Procedures    Ferritin    Standing Status:   Future    Expected Date:   12/08/2023    Expiration Date:   03/07/2024   Folate    Standing Status:   Future    Expected Date:   12/08/2023    Expiration Date:   03/07/2024   Vitamin B12    Standing Status:   Future    Expected Date:   12/08/2023    Expiration Date:   03/07/2024   CBC with Differential/Platelet    Standing Status:   Future    Expected Date:   12/08/2023    Expiration Date:   03/07/2024   Comprehensive metabolic panel with GFR    Standing Status:   Future    Expected Date:   12/08/2023    Expiration Date:   03/07/2024   Iron and TIBC    Standing Status:   Future    Expected Date:   12/08/2023    Expiration Date:   03/07/2024    The total time spent in the appointment was 20 minutes encounter with patients including review of chart and various tests results, discussions about plan of care and coordination of care plan  All questions were answered. The patient knows to call the clinic with any problems, questions or concerns. No barriers to learning was detected.  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 female following for thrombocytosis and iron deficiency.  Patient reports feeling significantly  fatigued.  Does not report melena, hematochezia.  She does have abdominal pain that has been stable. Recent colonoscopy does not show any active bleeding but she will likely patient.  She is in discussions with her GI for alternative treatments for her ulcerative colitis. She continues to report heavy menstrual bleeding every other month.  No loss of appetite or no loss of weight.  I have reviewed the past medical history, past surgical history, social  history and family history with the patient   ALLERGIES:  is allergic to imitrex [sumatriptan], maxalt [rizatriptan], phenergan [promethazine hcl], and tape.  MEDICATIONS:  Current Outpatient Medications  Medication Sig Dispense Refill   budesonide  (ENTOCORT EC ) 3 MG 24 hr capsule Take 3 capsules (9 mg total) by mouth daily. 90 capsule 1   Calcium  Carbonate-Vit D-Min (CALCIUM  1200) 1200-1000 MG-UNIT CHEW Chew 1 tablet by mouth daily. 30 tablet 0   calcium -vitamin D  (OSCAL WITH D) 500-200 MG-UNIT tablet Take 1 tablet by mouth daily with breakfast. 90 tablet 1   Cholecalciferol (VITAMIN D3) 125 MCG (5000 UT) CAPS Take 1 capsule (5,000 Units total) by mouth daily. 30 capsule 0   cholecalciferol (VITAMIN D3) 25 MCG (1000 UNIT) tablet Take 1,000 Units by mouth daily.     dicyclomine  (BENTYL ) 10 MG capsule Take 1 capsule (10 mg total) by mouth every 12 (twelve) hours as needed for spasms. 60 capsule 1   ferrous sulfate 324 MG TBEC Take 324 mg by mouth every other day.     levothyroxine  (SYNTHROID ) 150 MCG tablet Take 150 mcg by mouth daily before breakfast.     LIVDELZI 10 MG CAPS      norelgestromin -ethinyl estradiol  (XULANE) 150-35 MCG/24HR transdermal patch Place 1 patch onto the skin once a week. 9 patch 4   OVER THE COUNTER MEDICATION Take 200 mg by mouth daily. Magnesium     Phentermine HCl 8 MG TABS Take once daily around noon     topiramate (TOPAMAX) 15 MG capsule Take 15 mg by mouth 2 (two) times daily.     Vitamin D , Ergocalciferol , (DRISDOL) 1.25 MG (50000 UNIT) CAPS capsule Take 50,000 Units by mouth every 7 (seven) days.     No current facility-administered medications for this visit.     REVIEW OF SYSTEMS:   Constitutional: Denies fevers, chills or night sweats Eyes: Denies blurriness of vision Ears, nose, mouth, throat, and face: Denies mucositis or sore throat Respiratory: Denies cough, dyspnea or wheezes Cardiovascular: Denies palpitation, chest discomfort or lower extremity  swelling Gastrointestinal:  Denies nausea, heartburn or change in bowel habits Skin: Denies abnormal skin rashes Lymphatics: Denies new lymphadenopathy or easy bruising Neurological:Denies numbness, tingling or new weaknesses Behavioral/Psych: Mood is stable, no new changes  All other systems were reviewed with the patient and are negative.  PHYSICAL EXAMINATION:   Vitals:   09/12/23 1007  BP: 108/66  Pulse: 70  Resp: 16  Temp: 98 F (36.7 C)  SpO2: 98%    GENERAL:alert, no distress and comfortable LUNGS: clear to auscultation and percussion with normal breathing effort HEART: regular rate & rhythm and no murmurs and no lower extremity edema ABDOMEN:abdomen soft, non-tender and normal bowel sounds Musculoskeletal:no cyanosis of digits and no clubbing  NEURO: alert & oriented x 3 with fluent speech  LABORATORY DATA:  I have reviewed the data as listed  Lab Results  Component Value Date   WBC 11.4 (H) 09/08/2023   NEUTROABS 5.9 09/08/2023   HGB 12.3 09/08/2023   HCT 38.8 09/08/2023  MCV 94.9 09/08/2023   PLT 561 (H) 09/08/2023       Chemistry      Component Value Date/Time   NA 138 09/08/2023 1404   NA 139 12/20/2017 1043   K 4.0 09/08/2023 1404   CL 109 09/08/2023 1404   CO2 22 09/08/2023 1404   BUN 9 09/08/2023 1404   BUN 4 (L) 12/20/2017 1043   CREATININE 0.62 09/08/2023 1404   CREATININE 0.53 04/27/2020 1108      Component Value Date/Time   CALCIUM  8.5 (L) 09/08/2023 1404   ALKPHOS 241 (H) 09/08/2023 1404   AST 77 (H) 09/08/2023 1404   ALT 48 (H) 09/08/2023 1404   BILITOT 0.5 09/08/2023 1404   BILITOT 0.4 12/20/2017 1043      Latest Reference Range & Units 09/08/23 14:04  Iron 28 - 170 ug/dL 21 (L)  UIBC ug/dL 731  TIBC 749 - 549 ug/dL 710  Saturation Ratios 10.4 - 31.8 % 7 (L)  Ferritin 11 - 307 ng/mL 50  Folate >5.9 ng/mL 6.7  Vitamin B12 180 - 914 pg/mL 254  (L): Data is abnormally low   04/10/23 10:46  CALR +MPL + E12-E15  (REFLEX)  Negative  JAK2 V617F RFX CALR/MPL/E12-15 Negative  (C): Corrected Rpt: View report in Results Review for more information   Latest Reference Range & Units 04/10/23 10:46  Sed Rate 0 - 22 mm/hr 37 (H)  (H): Data is abnormally high  Colonoscopy: 09/04/23: Impression:  - Mild inflammation was found in the ileum secondary to Crohn' s disease with ileitis. Biopsied.  - Moderate to severe colitis. Biopsied.  - One 5 mm polyp in the sigmoid colon. Biopsied. Suspect inflammatory polyp. - The examination was otherwise normal.  FINAL DIAGNOSIS       1. Surgical [P], small bowel, ileum :      MILD ACUTE ILEITIS      CMV STAIN NEGATIVE (IHC, ADEQUATE CONTROL)       2. Surgical [P], R colon :      CHRONIC ACTIVE COLITIS WITH ULCERATION      NEGATIVE FOR DYSPLASIA AND GRANULOMAS      CMV STAIN NEGATIVE (I[facility], ADEQUATE CONTROL)       3. Surgical [P], colon, transverse :      CHRONIC ACTIVE COLITIS WITH EROSION      NEGATIVE FOR DYSPLASIA AND GRANULOMAS      CMV STAIN NEGATIVE (I[facility], ADEQUATE CONTROL)       4. Surgical [P], left colon :      CHRONIC ACTIVE COLITIS WITH EROSION      NEGATIVE FOR DYSPLASIA AND GRANULOMAS      CMV STAIN NEGATIVE (I[facility], ADEQUATE CONTROL)       5. Surgical [P], colon, sigmoid, polyp (1) :      BENIGN INFLAMMATORY/GRANULATION TISSUE POLYP      NEGATIVE FOR DYSPLASIA AND GRANULOMAS      CMV STAIN NEGATIVE (I[facility], ADEQUATE CONTROL)   Flow cytometry: 04/10/23  DIAGNOSIS:   Peripheral blood, flow cytometry:  -  No immunophenotypic evidence of a lymphoproliferative disorder (i.e.  no monoclonal B cells or immunophenotypically abnormal T cells  detected).   GATING AND PHENOTYPIC ANALYSIS:   Gated population: Flow cytometric immunophenotyping is performed using  antibodies to the antigens listed in the table below. Electronic gates  are placed around a cell cluster displaying light scatter properties  corresponding to: lymphocytes   Abnormal Cells in gated  population: N/A   Phenotype of Abnormal Cells: N/A

## 2023-09-12 NOTE — Assessment & Plan Note (Addendum)
 Severe iron deficiency likely secondary to chronic inflammatory bowel disease and heavy menstrual bleeding.   S/p IV iron with improvement in iron levels Labs today consistent with iron deficiency without anemia Recent colonoscopy with active inflammation but no evidence of bleeding  -Will administer 2 more doses of IV iron.  Reemphasized side effects including allergic reactions, nausea and headache.   -Follow-up with GYN and GI - Continue oral iron every other day.  Return to clinic in 3 months with labs to assess for further IV iron needs.  Patient might need frequent iron infusions to keep up with the micro blood loss happening with chronic bowel disease

## 2023-09-12 NOTE — Assessment & Plan Note (Addendum)
 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  No evidence of thrombosis at this time.  Negative JAK2/CALR/MPL/E12-15: Negative  S/p IV iron Labs reviewed with stable platelet counts at this time but iron deficiency persists  - Will administer 2 more doses of IV iron.  - Continue to monitor platelets

## 2023-09-12 NOTE — Assessment & Plan Note (Addendum)
 Continue to follow with GI.  On Skyrizi  currently Recent colonoscopy with inflammation consistent with colitis Patient is in discussion with GI for alternative treatment options

## 2023-09-12 NOTE — Assessment & Plan Note (Addendum)
 Likely secondary to chronic inflammation.  Flow cytometry negative.    - Continue to monitor counts

## 2023-09-12 NOTE — Assessment & Plan Note (Addendum)
 Mild vitamin B12 deficiencies with levels less than 400  - Continue vitamin B12 orally every day

## 2023-09-15 ENCOUNTER — Other Ambulatory Visit (INDEPENDENT_AMBULATORY_CARE_PROVIDER_SITE_OTHER)

## 2023-09-15 DIAGNOSIS — K760 Fatty (change of) liver, not elsewhere classified: Secondary | ICD-10-CM

## 2023-09-15 DIAGNOSIS — K50818 Crohn's disease of both small and large intestine with other complication: Secondary | ICD-10-CM

## 2023-09-15 DIAGNOSIS — Z79899 Other long term (current) drug therapy: Secondary | ICD-10-CM

## 2023-09-15 DIAGNOSIS — R7989 Other specified abnormal findings of blood chemistry: Secondary | ICD-10-CM

## 2023-09-18 ENCOUNTER — Ambulatory Visit: Payer: Self-pay | Admitting: Gastroenterology

## 2023-09-18 LAB — QUANTIFERON-TB GOLD PLUS
Mitogen-NIL: 9.58 [IU]/mL
NIL: 0.02 [IU]/mL
QuantiFERON-TB Gold Plus: NEGATIVE
TB1-NIL: 0 [IU]/mL
TB2-NIL: 0 [IU]/mL

## 2023-09-18 LAB — HEPATITIS B SURFACE ANTIGEN: Hepatitis B Surface Ag: NONREACTIVE

## 2023-09-18 LAB — HEPATITIS B CORE ANTIBODY, TOTAL: Hep B Core Total Ab: NONREACTIVE

## 2023-09-18 NOTE — Progress Notes (Signed)
 Naomie, The auth was submitted on 7/31.  I checked yesterday and its still in pending status, being reviewed.  Once I have a response from the insurance I will f/u.  I will call again today for an update. Luke

## 2023-09-19 ENCOUNTER — Other Ambulatory Visit: Payer: Self-pay

## 2023-09-19 ENCOUNTER — Inpatient Hospital Stay

## 2023-09-19 VITALS — BP 89/65 | HR 67 | Temp 97.9°F | Resp 18

## 2023-09-19 DIAGNOSIS — D72829 Elevated white blood cell count, unspecified: Secondary | ICD-10-CM | POA: Diagnosis not present

## 2023-09-19 DIAGNOSIS — E611 Iron deficiency: Secondary | ICD-10-CM

## 2023-09-19 MED ORDER — SODIUM CHLORIDE 0.9 % IV SOLN
510.0000 mg | Freq: Once | INTRAVENOUS | Status: AC
  Administered 2023-09-19: 510 mg via INTRAVENOUS
  Filled 2023-09-19: qty 510

## 2023-09-19 MED ORDER — SODIUM CHLORIDE 0.9 % IV SOLN: INTRAVENOUS | Status: DC

## 2023-09-19 MED ORDER — CETIRIZINE HCL 10 MG PO TABS
10.0000 mg | ORAL_TABLET | Freq: Once | ORAL | Status: AC
  Administered 2023-09-19: 10 mg via ORAL
  Filled 2023-09-19: qty 1

## 2023-09-19 NOTE — Progress Notes (Signed)
 Patient is here today for her iron infusion.  She reports that she took her tylenol  1000mg  at home this morning.  She will need the Cetirizine  here.

## 2023-09-19 NOTE — Progress Notes (Signed)
 Patient tolerated iron infusion with no complaints voiced.  Peripheral IV site clean and dry with good blood return noted before and after infusion.  Band aid applied. Pt observed for 30 minutes post iron infusion without any complications.  VSS and pt stable with discharge and left in satisfactory condition with no s/s of distress noted. All follow ups as scheduled.   Tarnisha Kachmar

## 2023-09-19 NOTE — Patient Instructions (Signed)

## 2023-09-23 ENCOUNTER — Other Ambulatory Visit: Payer: Self-pay | Admitting: Pharmacy Technician

## 2023-09-23 ENCOUNTER — Other Ambulatory Visit: Payer: Self-pay

## 2023-09-23 NOTE — Progress Notes (Signed)
 Dis enrolling patient from program due to transfer of care to another pharmacy speciality per chart 7/30.   RPh Beth verified to dis enroll.

## 2023-09-24 ENCOUNTER — Telehealth: Payer: Self-pay

## 2023-09-24 NOTE — Telephone Encounter (Signed)
 Great, thank you!

## 2023-09-24 NOTE — Telephone Encounter (Addendum)
 Dr. Leigh, patient will be scheduled as soon as possible.  Auth Submission: APPROVED Site of care: Site of care: [facility] AP Payer: [employer] and [facility] Medicaid Medication & CPT/J Code(s) submitted: Remicade (Infliximab) J1745 Diagnosis Code:  Route of submission (phone, fax, portal): fax  Phone #  Fax # [phone]  Auth type: Buy/Bill PB Units/visits requested: 480 units x 8 doses Reference number: [id number] Approval from: 09/18/23 to 09/17/24

## 2023-09-26 ENCOUNTER — Inpatient Hospital Stay

## 2023-09-26 ENCOUNTER — Encounter: Payer: Self-pay | Admitting: Gastroenterology

## 2023-09-26 VITALS — BP 100/65 | HR 77 | Temp 98.0°F | Resp 18

## 2023-09-26 DIAGNOSIS — D72829 Elevated white blood cell count, unspecified: Secondary | ICD-10-CM | POA: Diagnosis not present

## 2023-09-26 DIAGNOSIS — E611 Iron deficiency: Secondary | ICD-10-CM

## 2023-09-26 MED ORDER — SODIUM CHLORIDE 0.9 % IV SOLN
510.0000 mg | Freq: Once | INTRAVENOUS | Status: AC
  Administered 2023-09-26: 510 mg via INTRAVENOUS
  Filled 2023-09-26: qty 510

## 2023-09-26 MED ORDER — SODIUM CHLORIDE 0.9 % IV SOLN
INTRAVENOUS | Status: DC
Start: 2023-09-26 — End: 2023-09-26

## 2023-09-26 NOTE — Progress Notes (Signed)
 Patient tolerated iron infusion with no complaints voiced.  Peripheral IV site clean and dry with good blood return noted before and after infusion.  Pt observed post iron without any complications. VSS with discharge and left in satisfactory condition with no s/s of distress noted. All follow ups scheduled.   [name]

## 2023-09-30 ENCOUNTER — Ambulatory Visit: Admitting: Nurse Practitioner

## 2023-10-01 ENCOUNTER — Encounter: Attending: Gastroenterology | Admitting: *Deleted

## 2023-10-01 VITALS — BP 101/68 | HR 75 | Temp 98.3°F | Resp 16 | Wt 280.2 lb

## 2023-10-01 DIAGNOSIS — K50818 Crohn's disease of both small and large intestine with other complication: Secondary | ICD-10-CM

## 2023-10-01 MED ORDER — DIPHENHYDRAMINE HCL 25 MG PO CAPS
25.0000 mg | ORAL_CAPSULE | Freq: Once | ORAL | Status: AC
  Administered 2023-10-01: 25 mg via ORAL

## 2023-10-01 MED ORDER — ACETAMINOPHEN 325 MG PO TABS
650.0000 mg | ORAL_TABLET | Freq: Once | ORAL | Status: AC
Start: 2023-10-01 — End: 2023-10-01
  Administered 2023-10-01: 650 mg via ORAL

## 2023-10-01 MED ORDER — SODIUM CHLORIDE 0.9 % IV SOLN
5.0000 mg/kg | Freq: Once | INTRAVENOUS | Status: AC
  Administered 2023-10-01: 600 mg via INTRAVENOUS
  Filled 2023-10-01: qty 60

## 2023-10-01 MED ORDER — METHYLPREDNISOLONE SODIUM SUCC 40 MG IJ SOLR
40.0000 mg | Freq: Once | INTRAMUSCULAR | Status: AC
  Administered 2023-10-01: 40 mg via INTRAVENOUS

## 2023-10-01 NOTE — Progress Notes (Signed)
 Diagnosis: Crohn's Disease  Provider:  Leigh Standing MD  Procedure: IV Infusion  IV Type: Peripheral, IV Location: R Antecubital  Remicade  (Infliximab ), Dose: 600 mg  Infusion Start Time: 1110  Infusion Stop Time: 1315  Post Infusion IV Care: Observation period completed  Discharge: Condition: Good, Destination: Home . AVS Provided  Performed by:  Baldwin Darice Helling, RN

## 2023-10-06 ENCOUNTER — Telehealth: Payer: Self-pay

## 2023-10-06 NOTE — Telephone Encounter (Signed)
-----   Message from Elspeth SHAUNNA Naval sent at 10/06/2023  2:06 PM EDT ----- Regarding: RE: fecal calprotectin Let's do about 3 months or so. Can you help book her a follow up with me in 2-46months? Thanks ----- Message ----- From: Edmundo Clarita HERO, CMA Sent: 10/06/2023  11:06 AM EDT To: Elspeth SHAUNNA Naval, MD Subject: FW: fecal calprotectin                         Can you clarify when you wanted her to have next fecal calprotectin? Thanks ----- Message ----- From: Edmundo Clarita HERO, CMA Sent: 10/06/2023  12:00 AM EDT To: Clarita HERO Edmundo, CMA Subject: fecal calprotectin                             Due for Fecal calprotectin

## 2023-10-06 NOTE — Telephone Encounter (Signed)
 Patient scheduled for appointment in Nov.  MyChart message to patient.

## 2023-10-08 ENCOUNTER — Telehealth: Payer: Self-pay | Admitting: Pharmacy Technician

## 2023-10-08 NOTE — Telephone Encounter (Signed)
 error

## 2023-10-15 ENCOUNTER — Encounter: Attending: Gastroenterology | Admitting: *Deleted

## 2023-10-15 VITALS — BP 101/70 | HR 71 | Temp 98.3°F | Resp 16

## 2023-10-15 DIAGNOSIS — K50818 Crohn's disease of both small and large intestine with other complication: Secondary | ICD-10-CM | POA: Insufficient documentation

## 2023-10-15 MED ORDER — METHYLPREDNISOLONE SODIUM SUCC 40 MG IJ SOLR
40.0000 mg | Freq: Once | INTRAMUSCULAR | Status: AC
  Administered 2023-10-15: 40 mg via INTRAVENOUS

## 2023-10-15 MED ORDER — ACETAMINOPHEN 325 MG PO TABS
650.0000 mg | ORAL_TABLET | Freq: Once | ORAL | Status: AC
  Administered 2023-10-15: 650 mg via ORAL

## 2023-10-15 MED ORDER — SODIUM CHLORIDE 0.9 % IV SOLN
5.0000 mg/kg | Freq: Once | INTRAVENOUS | Status: AC
  Administered 2023-10-15: 600 mg via INTRAVENOUS
  Filled 2023-10-15: qty 60

## 2023-10-15 MED ORDER — DIPHENHYDRAMINE HCL 25 MG PO CAPS
25.0000 mg | ORAL_CAPSULE | Freq: Once | ORAL | Status: AC
  Administered 2023-10-15: 25 mg via ORAL

## 2023-10-15 NOTE — Progress Notes (Signed)
 Diagnosis: Crohn's Disease  Provider:  Leigh Standing MD  Procedure: IV Infusion  IV Type: Peripheral, IV Location: R Antecubital  Remicade  (Infliximab ), Dose: 600 mg  Infusion Start Time: 1045  Infusion Stop Time: 1253  Post Infusion IV Care: Observation period completed  Discharge: Condition: Good, Destination: Home . AVS Provided  Performed by:  Baldwin Darice Helling, RN

## 2023-10-24 ENCOUNTER — Encounter: Payer: Self-pay | Admitting: Gastroenterology

## 2023-10-24 ENCOUNTER — Encounter: Payer: Self-pay | Admitting: Oncology

## 2023-10-29 ENCOUNTER — Encounter: Payer: Self-pay | Admitting: Gastroenterology

## 2023-10-29 DIAGNOSIS — Z79899 Other long term (current) drug therapy: Secondary | ICD-10-CM

## 2023-10-29 DIAGNOSIS — K50818 Crohn's disease of both small and large intestine with other complication: Secondary | ICD-10-CM

## 2023-11-13 ENCOUNTER — Encounter: Attending: Gastroenterology | Admitting: *Deleted

## 2023-11-13 VITALS — BP 87/63 | HR 66 | Temp 98.3°F | Resp 16 | Wt 280.2 lb

## 2023-11-13 DIAGNOSIS — K50818 Crohn's disease of both small and large intestine with other complication: Secondary | ICD-10-CM | POA: Diagnosis not present

## 2023-11-13 MED ORDER — ACETAMINOPHEN 325 MG PO TABS
650.0000 mg | ORAL_TABLET | Freq: Once | ORAL | Status: AC
  Administered 2023-11-13: 650 mg via ORAL

## 2023-11-13 MED ORDER — DIPHENHYDRAMINE HCL 25 MG PO CAPS
25.0000 mg | ORAL_CAPSULE | Freq: Once | ORAL | Status: AC
  Administered 2023-11-13: 25 mg via ORAL

## 2023-11-13 MED ORDER — SODIUM CHLORIDE 0.9 % IV SOLN
5.0000 mg/kg | Freq: Once | INTRAVENOUS | Status: AC
  Administered 2023-11-13: 600 mg via INTRAVENOUS
  Filled 2023-11-13: qty 60

## 2023-11-13 MED ORDER — METHYLPREDNISOLONE SODIUM SUCC 40 MG IJ SOLR
40.0000 mg | Freq: Once | INTRAMUSCULAR | Status: AC
  Administered 2023-11-13: 40 mg via INTRAVENOUS

## 2023-11-13 NOTE — Progress Notes (Signed)
 Diagnosis: Crohn's Disease  Provider:  Leigh Standing MD  Procedure: IV Infusion  IV Type: Peripheral, IV Location: R Antecubital  Remicade  (Infliximab ), Dose: 600 mg  Infusion Start Time: 1040  Infusion Stop Time: 1250  Post Infusion IV Care: Observation period completed  Discharge: Condition: Good, Destination: Home . AVS Provided  Performed by:  Baldwin Darice Helling, RN

## 2023-11-25 ENCOUNTER — Encounter: Payer: Self-pay | Admitting: *Deleted

## 2023-12-08 ENCOUNTER — Inpatient Hospital Stay: Attending: Hematology

## 2023-12-08 DIAGNOSIS — D75839 Thrombocytosis, unspecified: Secondary | ICD-10-CM | POA: Diagnosis present

## 2023-12-08 DIAGNOSIS — E611 Iron deficiency: Secondary | ICD-10-CM | POA: Diagnosis not present

## 2023-12-08 DIAGNOSIS — D72829 Elevated white blood cell count, unspecified: Secondary | ICD-10-CM | POA: Diagnosis present

## 2023-12-08 DIAGNOSIS — Z79899 Other long term (current) drug therapy: Secondary | ICD-10-CM | POA: Insufficient documentation

## 2023-12-08 DIAGNOSIS — E538 Deficiency of other specified B group vitamins: Secondary | ICD-10-CM | POA: Diagnosis not present

## 2023-12-08 DIAGNOSIS — K50818 Crohn's disease of both small and large intestine with other complication: Secondary | ICD-10-CM | POA: Insufficient documentation

## 2023-12-08 LAB — COMPREHENSIVE METABOLIC PANEL WITH GFR
ALT: 32 U/L (ref 0–44)
AST: 38 U/L (ref 15–41)
Albumin: 4 g/dL (ref 3.5–5.0)
Alkaline Phosphatase: 233 U/L — ABNORMAL HIGH (ref 38–126)
Anion gap: 9 (ref 5–15)
BUN: 9 mg/dL (ref 6–20)
CO2: 25 mmol/L (ref 22–32)
Calcium: 8.8 mg/dL — ABNORMAL LOW (ref 8.9–10.3)
Chloride: 107 mmol/L (ref 98–111)
Creatinine, Ser: 0.63 mg/dL (ref 0.44–1.00)
GFR, Estimated: >60 mL/min (ref >60)
Glucose, Bld: 87 mg/dL (ref 70–99)
Potassium: 4.2 mmol/L (ref 3.5–5.1)
Sodium: 140 mmol/L (ref 135–145)
Total Bilirubin: 0.3 mg/dL (ref 0.0–1.2)
Total Protein: 7.3 g/dL (ref 6.5–8.1)

## 2023-12-08 LAB — CBC WITH DIFFERENTIAL/PLATELET
Abs Immature Granulocytes: 0.02 K/uL (ref 0.00–0.07)
Basophils Absolute: 0.1 K/uL (ref 0.0–0.1)
Basophils Relative: 1 %
Eosinophils Absolute: 0.2 K/uL (ref 0.0–0.5)
Eosinophils Relative: 2 %
HCT: 41.7 % (ref 36.0–46.0)
Hemoglobin: 13.5 g/dL (ref 12.0–15.0)
Immature Granulocytes: 0 %
Lymphocytes Relative: 43 %
Lymphs Abs: 4.4 K/uL — ABNORMAL HIGH (ref 0.7–4.0)
MCH: 30.5 pg (ref 26.0–34.0)
MCHC: 32.4 g/dL (ref 30.0–36.0)
MCV: 94.1 fL (ref 80.0–100.0)
Monocytes Absolute: 0.9 K/uL (ref 0.1–1.0)
Monocytes Relative: 9 %
Neutro Abs: 4.5 K/uL (ref 1.7–7.7)
Neutrophils Relative %: 45 %
Platelets: 510 K/uL — ABNORMAL HIGH (ref 150–400)
RBC: 4.43 MIL/uL (ref 3.87–5.11)
RDW: 13.2 % (ref 11.5–15.5)
WBC: 10.1 K/uL (ref 4.0–10.5)
nRBC: 0 % (ref 0.0–0.2)

## 2023-12-08 LAB — FOLATE: Folate: 8 ng/mL (ref >5.9)

## 2023-12-08 LAB — IRON AND TIBC
Iron: 33 ug/dL (ref 28–170)
Saturation Ratios: 13 % (ref 10.4–31.8)
TIBC: 258 ug/dL (ref 250–450)
UIBC: 225 ug/dL

## 2023-12-08 LAB — VITAMIN B12: Vitamin B-12: 404 pg/mL (ref 180–914)

## 2023-12-08 LAB — FERRITIN: Ferritin: 299 ng/mL (ref 11–307)

## 2023-12-15 ENCOUNTER — Ambulatory Visit: Admitting: Oncology

## 2023-12-17 ENCOUNTER — Inpatient Hospital Stay: Attending: Hematology | Admitting: Oncology

## 2023-12-17 VITALS — BP 100/77 | HR 79 | Temp 99.7°F | Resp 18 | Ht 64.0 in | Wt 280.6 lb

## 2023-12-17 DIAGNOSIS — D75839 Thrombocytosis, unspecified: Secondary | ICD-10-CM | POA: Insufficient documentation

## 2023-12-17 DIAGNOSIS — Z79899 Other long term (current) drug therapy: Secondary | ICD-10-CM | POA: Diagnosis not present

## 2023-12-17 DIAGNOSIS — Z888 Allergy status to other drugs, medicaments and biological substances status: Secondary | ICD-10-CM | POA: Diagnosis not present

## 2023-12-17 DIAGNOSIS — D509 Iron deficiency anemia, unspecified: Secondary | ICD-10-CM | POA: Insufficient documentation

## 2023-12-17 DIAGNOSIS — K635 Polyp of colon: Secondary | ICD-10-CM | POA: Diagnosis not present

## 2023-12-17 DIAGNOSIS — K50818 Crohn's disease of both small and large intestine with other complication: Secondary | ICD-10-CM

## 2023-12-17 DIAGNOSIS — E538 Deficiency of other specified B group vitamins: Secondary | ICD-10-CM | POA: Insufficient documentation

## 2023-12-17 DIAGNOSIS — N920 Excessive and frequent menstruation with regular cycle: Secondary | ICD-10-CM | POA: Insufficient documentation

## 2023-12-17 DIAGNOSIS — K50011 Crohn's disease of small intestine with rectal bleeding: Secondary | ICD-10-CM | POA: Insufficient documentation

## 2023-12-17 DIAGNOSIS — M4854XA Collapsed vertebra, not elsewhere classified, thoracic region, initial encounter for fracture: Secondary | ICD-10-CM | POA: Diagnosis not present

## 2023-12-17 DIAGNOSIS — I719 Aortic aneurysm of unspecified site, without rupture: Secondary | ICD-10-CM | POA: Diagnosis not present

## 2023-12-17 DIAGNOSIS — D72829 Elevated white blood cell count, unspecified: Secondary | ICD-10-CM | POA: Insufficient documentation

## 2023-12-17 DIAGNOSIS — E611 Iron deficiency: Secondary | ICD-10-CM | POA: Diagnosis not present

## 2023-12-17 NOTE — Progress Notes (Signed)
 [facility] at [facility]  HEMATOLOGY FOLLOW-UP VISIT  [place], [place], [place]  REASON FOR FOLLOW-UP: Thrombocytosis  ASSESSMENT & PLAN:  [name] is a 38 y.o. 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  No evidence of thrombosis at this time.  Negative JAK2/CALR/MPL/E12-15: Negative  S/p IV iron with some improvement in platelets  - Recent change from Skyrizi  to Remicade . -Remicade /infliximab  can cause thrombocytopenia - Will continue to monitor counts at this time  Iron deficiency Severe iron deficiency likely secondary to chronic inflammatory bowel disease and heavy menstrual bleeding.   Patient reports heavy menstrual bleeding every other cycle. S/p IV iron with improvement in iron levels Labs today consistent with improvement after iron infusion Recent colonoscopy with active inflammation but no evidence of bleeding.  Patient continues to report bright red blood in stools.   - Continue to follow with GI - Patient is in search for a new GYN - Continue oral iron every other day.  No constipation reported.  Return to clinic in 3 months with labs.  Patient might need occasional IV iron infusion considering active GI bleeding and chron's disease  Leukocytosis Likely secondary to chronic inflammation Flow cytometry negative  - Resolved at this time  Crohn's disease Currently on Remicade , recently changed from Skyrizi . Recent colonoscopy with inflammation consistent with colitis   -Continue to follow with GI  Vitamin B12 deficiency Mild vitamin B12 deficiencies with initial level less than 400 Improvement in levels with supplementation   - Continue vitamin B12 orally every day    Orders Placed This Encounter  Procedures   CBC with Differential    Standing Status:   Future    Expected Date:   03/15/2024    Expiration Date:    06/13/2024   Comprehensive metabolic panel    Standing Status:   Future    Expected Date:   03/15/2024    Expiration Date:   06/13/2024   Iron and TIBC ([facility] ONLY)    Standing Status:   Future    Expected Date:   03/15/2024    Expiration Date:   06/13/2024   Ferritin    Standing Status:   Future    Expected Date:   03/15/2024    Expiration Date:   06/13/2024   Vitamin B12    Standing Status:   Future    Expected Date:   03/15/2024    Expiration Date:   06/13/2024   Folate    Standing Status:   Future    Expected Date:   03/15/2024    Expiration Date:   06/13/2024    The total time spent in the appointment was 30 minutes encounter with patients including review of chart and various tests results, discussions about plan of care and coordination of care plan  All questions were answered. The patient knows to call the clinic with any problems, questions or concerns. No barriers to learning was detected.  Mickiel Dry, MD 11/5/20255:36 PM   SUMMARY OF HEMATOLOGIC HISTORY: -Thrombocytosis likely secondary to iron deficiency and medication induced by Skyrizi  -S/p IV Feraheme 510 mg on 04/15/2023,04/25/2023, 09/19/2023, 09/26/2023 - Recent colonoscopy with active inflammation with no evidence of bleeding   INTERVAL HISTORY: Discussed the use of AI scribe software for clinical note transcription with the patient, who gave verbal consent to proceed.  History of Present Illness [name] is a 38 year old female  with Crohn's disease and iron deficiency anemia who presents for follow-up after iron infusion.  She has not experienced significant improvement in energy levels following the last iron infusion, although she does not feel drained. Her transferrin saturation increased from 7% to 13% and ferritin levels rose from 50 to 299. She is taking iron pills every other day and vitamin B12 supplements; her vitamin B12 levels increased from 254 to 404.  She is currently on  Remicade  for Crohn's disease, having received four doses since August. The infusions are administered intravenously, lasting approximately two hours, initially monthly and now transitioning to every eight weeks. Despite treatment, there is no change in symptoms, including persistent diarrhea. She experiences blood in her stools at least twice a week, with small clots visible in the toilet, and occasional larger red clots.  She experienced a fall resulting in a T4 compression fracture and coincidentally discovered a aortic aneurysm. The pain from the fracture has improved but still persists. She is awaiting an orthopedic consultation and has a cardiology appointment scheduled for January regarding the aneurysm.  She experiences heavy menstrual bleeding every other month. She has been unable to find a suitable gynecologist after her previous one suggested birth control, which she cannot take due to previous adverse effects and potential interactions with her current medications.    I have reviewed the past medical history, past surgical history, social history and family history with the patient   ALLERGIES:  is allergic to imitrex [sumatriptan], maxalt [rizatriptan], phenergan [promethazine hcl], and tape.  MEDICATIONS:  Current Outpatient Medications  Medication Sig Dispense Refill   cyclobenzaprine  (FLEXERIL ) 10 MG tablet Take 10 mg by mouth.     levothyroxine  (SYNTHROID ) 175 MCG tablet Take 175 mcg by mouth daily.     budesonide  (ENTOCORT EC ) 3 MG 24 hr capsule Take 3 capsules (9 mg total) by mouth daily. 90 capsule 1   buPROPion (WELLBUTRIN XL) 150 MG 24 hr tablet Take 150 mg by mouth every morning.     Calcium  Carbonate-Vit D-Min (CALCIUM  1200) 1200-1000 MG-UNIT CHEW Chew 1 tablet by mouth daily. 30 tablet 0   calcium -vitamin D  (OSCAL WITH D) 500-200 MG-UNIT tablet Take 1 tablet by mouth daily with breakfast. 90 tablet 1   cholecalciferol (VITAMIN D3) 25 MCG (1000 UNIT) tablet Take 1,000  Units by mouth daily.     dicyclomine  (BENTYL ) 10 MG capsule Take 1 capsule (10 mg total) by mouth every 12 (twelve) hours as needed for spasms. 60 capsule 1   ferrous sulfate 324 MG TBEC Take 324 mg by mouth every other day.     LIVDELZI 10 MG CAPS      norelgestromin -ethinyl estradiol  (XULANE) 150-35 MCG/24HR transdermal patch Place 1 patch onto the skin once a week. 9 patch 4   OVER THE COUNTER MEDICATION Take 200 mg by mouth daily. Magnesium     Phentermine HCl 8 MG TABS Take once daily around noon     topiramate (TOPAMAX) 15 MG capsule Take 15 mg by mouth 2 (two) times daily.     Vitamin D , Ergocalciferol , (DRISDOL) 1.25 MG (50000 UNIT) CAPS capsule Take 50,000 Units by mouth every 7 (seven) days.     No current facility-administered medications for this visit.     REVIEW OF SYSTEMS:   Constitutional: Denies fevers, chills or night sweats Eyes: Denies blurriness of vision Ears, nose, mouth, throat, and face: Denies mucositis or sore throat Respiratory: Denies cough, dyspnea or wheezes Cardiovascular: Denies palpitation, chest discomfort or lower extremity  swelling Gastrointestinal:  Denies nausea, heartburn or change in bowel habits Skin: Denies abnormal skin rashes Lymphatics: Denies new lymphadenopathy or easy bruising Neurological:Denies numbness, tingling or new weaknesses Behavioral/Psych: Mood is stable, no new changes  All other systems were reviewed with the patient and are negative.  PHYSICAL EXAMINATION:   Vitals:   12/17/23 1527 12/17/23 1534  BP: (!) 79/62 100/77  Pulse: 79   Resp: 18   Temp: 99.7 F (37.6 C)   SpO2: 98%     GENERAL:alert, no distress and comfortable LUNGS: clear to auscultation and percussion with normal breathing effort HEART: regular rate & rhythm and no murmurs and no lower extremity edema ABDOMEN:abdomen soft, non-tender and normal bowel sounds Musculoskeletal:no cyanosis of digits and no clubbing  NEURO: alert & oriented x 3 with  fluent speech  LABORATORY DATA:  I have reviewed the data as listed  Lab Results  Component Value Date   WBC 10.1 12/08/2023   NEUTROABS 4.5 12/08/2023   HGB 13.5 12/08/2023   HCT 41.7 12/08/2023   MCV 94.1 12/08/2023   PLT 510 (H) 12/08/2023       Chemistry      Component Value Date/Time   NA 140 12/08/2023 1123   NA 139 12/20/2017 1043   K 4.2 12/08/2023 1123   CL 107 12/08/2023 1123   CO2 25 12/08/2023 1123   BUN 9 12/08/2023 1123   BUN 4 (L) 12/20/2017 1043   CREATININE 0.63 12/08/2023 1123   CREATININE 0.53 04/27/2020 1108      Component Value Date/Time   CALCIUM  8.8 (L) 12/08/2023 1123   ALKPHOS 233 (H) 12/08/2023 1123   AST 38 12/08/2023 1123   ALT 32 12/08/2023 1123   BILITOT 0.3 12/08/2023 1123   BILITOT 0.4 12/20/2017 1043      Latest Reference Range & Units 12/08/23 11:23  Iron 28 - 170 ug/dL 33  UIBC ug/dL 774  TIBC 749 - 549 ug/dL 741  Saturation Ratios 10.4 - 31.8 % 13  Ferritin 11 - 307 ng/mL 299  Folate >5.9 ng/mL 8.0  Vitamin B12 180 - 914 pg/mL 404     04/10/23 10:46  CALR +MPL + E12-E15  (REFLEX) Negative  JAK2 [id number] RFX CALR/MPL/E12-15 Negative  (C): Corrected Rpt: View report in Results Review for more information   Latest Reference Range & Units 04/10/23 10:46  Sed Rate 0 - 22 mm/hr 37 (H)  (H): Data is abnormally high  Colonoscopy: 09/04/23: Impression:  - Mild inflammation was found in the ileum secondary to Crohn' s disease with ileitis. Biopsied.  - Moderate to severe colitis. Biopsied.  - One 5 mm polyp in the sigmoid colon. Biopsied. Suspect inflammatory polyp. - The examination was otherwise normal.  FINAL DIAGNOSIS       1. Surgical [P], small bowel, ileum :      MILD ACUTE ILEITIS      CMV STAIN NEGATIVE ([facility], ADEQUATE CONTROL)       2. Surgical [P], R colon :      CHRONIC ACTIVE COLITIS WITH ULCERATION      NEGATIVE FOR DYSPLASIA AND GRANULOMAS      CMV STAIN NEGATIVE ([facility], ADEQUATE CONTROL)       3. Surgical  [P], colon, transverse :      CHRONIC ACTIVE COLITIS WITH EROSION      NEGATIVE FOR DYSPLASIA AND GRANULOMAS      CMV STAIN NEGATIVE ([facility], ADEQUATE CONTROL)       4. Surgical [P], left colon :  CHRONIC ACTIVE COLITIS WITH EROSION      NEGATIVE FOR DYSPLASIA AND GRANULOMAS      CMV STAIN NEGATIVE ([facility], ADEQUATE CONTROL)       5. Surgical [P], colon, sigmoid, polyp (1) :      BENIGN INFLAMMATORY/GRANULATION TISSUE POLYP      NEGATIVE FOR DYSPLASIA AND GRANULOMAS      CMV STAIN NEGATIVE ([facility], ADEQUATE CONTROL)   Flow cytometry: 04/10/23  DIAGNOSIS:   Peripheral blood, flow cytometry:  -  No immunophenotypic evidence of a lymphoproliferative disorder (i.e.  no monoclonal B cells or immunophenotypically abnormal T cells  detected).   GATING AND PHENOTYPIC ANALYSIS:   Gated population: Flow cytometric immunophenotyping is performed using  antibodies to the antigens listed in the table below. Electronic gates  are placed around a cell cluster displaying light scatter properties  corresponding to: lymphocytes   Abnormal Cells in gated population: N/A   Phenotype of Abnormal Cells: N/A

## 2023-12-17 NOTE — Patient Instructions (Addendum)

## 2023-12-19 ENCOUNTER — Other Ambulatory Visit (INDEPENDENT_AMBULATORY_CARE_PROVIDER_SITE_OTHER)

## 2023-12-19 DIAGNOSIS — Z79899 Other long term (current) drug therapy: Secondary | ICD-10-CM

## 2023-12-19 DIAGNOSIS — K50818 Crohn's disease of both small and large intestine with other complication: Secondary | ICD-10-CM

## 2023-12-23 ENCOUNTER — Encounter: Payer: Self-pay | Admitting: Gastroenterology

## 2023-12-23 ENCOUNTER — Other Ambulatory Visit

## 2023-12-23 ENCOUNTER — Encounter: Payer: Self-pay | Admitting: Oncology

## 2023-12-23 ENCOUNTER — Ambulatory Visit: Payer: Self-pay | Admitting: Gastroenterology

## 2023-12-23 DIAGNOSIS — K50818 Crohn's disease of both small and large intestine with other complication: Secondary | ICD-10-CM

## 2023-12-23 LAB — QUANTIFERON-TB GOLD PLUS
Mitogen-NIL: 7.5 [IU]/mL
NIL: 0.02 [IU]/mL
QuantiFERON-TB Gold Plus: NEGATIVE
TB1-NIL: 0.01 [IU]/mL
TB2-NIL: 0 [IU]/mL

## 2023-12-28 LAB — CALPROTECTIN: Calprotectin: 1150 ug/g — ABNORMAL HIGH

## 2023-12-28 NOTE — Progress Notes (Unsigned)
 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     Recap of major GI issues:   Liver disease: intermittent elevations in her liver enzymes for years.  Denies any family history of liver disease or cirrhosis.  Dates back to at least 2012 with her pregnancy.  She does not drink any alcohol routinely.  She had a serologic workup in 2020 and ultimately a liver biopsy.  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  Stage I fibrosis noted at that time. She had positive ASMA but her IgG levels were normal. She was started on ursodiol .  Prescribed 900 mg twice daily (based on weight, 13 to 15 mg/kg). She has had a history of osteopenia on remote DEXA scan that was done in 2021. She also has a vitamin D  deficiency.   Follows with [facility].  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  MRCP shows no evidence of PSC.  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   Crohn's disease: Back in 2020 she had an abnormal CT scan in February 2020 showing a suspected ascending colon mass with lymphadenopathy.  Also ventral wall hernia noted.  This led to a colonoscopy the following month which showed some inflammatory changes in the right colon and edema, biopsies were taken and were normal.   She states over time she has developed diarrhea with significant urgency and mucus and rectal bleeding.  This led to a follow-up colonoscopy with Dr. Eartha in March 2022.  She had overt colitis and most of her colon, biopsies showed chronic active colitis.  She also had ileitis on biopsies although the ileum appeared normal.  Of note the rectum and sigmoid colon were normal on biopsies.  It appears she was started on Lialda  to treat this initially.  She states she had paradoxical worsening on this regimen and her diarrhea and GI symptoms got much worse so she stopped it.  She states he was not aware of any  other treatment for her colitis, essentially has not been on anything since that time.   She had been placed on Entyvio  in April 2024.  Fecal calprotectin markedly elevated in the 2000's. Transitioned to Skyrizi  end of 2024.     SINCE LAST VISIT:     Patient here for follow-up visit.  Recall after her last visit we did a stool test which was positive for Salmonella.  She was treated with a course of Cipro .  She states that did help her symptoms to some extent but she continued to have loose stools and abdominal pain.  We eventually got approval to switch her biologic to Skyrizi .  Entyvio  was stopped.  She has completed 3 infusions of Skyrizi  from October through December 26.  She states she is tolerating it pretty well in general he is doing better.  She states the prednisone  taper we gave her helped with her bowel symptoms but her mood was quite variable and she did not like being on the steroids.   Currently she has a few bowel movements in the morning and a few more later in the day.  She does not have any significant abdominal pain most of the time, when she has menstrual cycles that can become worse.  She does have some urgency with her stools at time but no blood.  She thinks in general she is doing better on the Skyrizi .  She  has not received any outpatient education or notification of getting the injection which she is due to start very soon   Since have last seen her she has received the Shingrix vaccine as well as the PCV PCV21 vaccine.   She has had QuantiFERON gold that has been negative this year.     In regards to her liver disease, she has seen [facility], Stephane Quest since our last visit.  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  MRCP negative. Also component of MALSD. She remains on ursodiol , LAEs show elevated AP , AST / ALT normal.  She has had follow-up visit with [facility], they had fracture lysed her alk phos and there is a significant intestinal component and thus they have not  recommended more aggressive therapy of PBC at this point time.  She remains on ursodiol .   Further, for her fatty liver and obesity she was referred to Dr. Harlene Leveda Alexander of [facility].  They have seen her and are starting a calorie restricted diet for her with high-protein.  She inquired if she could start one of the few medications offered to her such as Topamax, phentermine, and GLP-1 agonists.  She asked my opinion on this   Of note, on labs in January 28 that she had WBC of 10.5 and platelet count of 598.  Hemoglobin 12.0.     Prior workup: Celiac panel negative 04/27/20   Colonoscopy 05/05/20: - The examined portion of the ileum was normal. Biopsied. - Diffuse mild inflammation was found in the descending colon, in the transverse colon, in the ascending colon and in the cecum secondary to inflammatory bowel disease. Biopsied. - The distal rectum and anal verge are normal on retroflexion view.   FINAL MICROSCOPIC DIAGNOSIS:  A. TERMINAL ILEUM, BIOPSY: - Chronic mildly active ileitis. - No granuloma. - No dysplasia or malignancy.  B. COLON, CECAL, BIOPSY: - Chronic severally active colitis. - No granulomas. - No dysplasia or malignancy.  C. COLON, ASCENDING, BIOPSY: - Chronic moderately active colitis. - No granulomas. - No dysplasia or malignancy.  D. COLON, TRANSVERSE, BIOPSY: - Chronic moderately active colitis. - No granulomas. - No dysplasia or malignancy.  E. COLON, DESCENDING, BIOPSY: - Chronic moderately active colitis. - No granulomas. - No dysplasia or malignancy.  F. COLON, SIGMOID, BIOPSY: - Benign colonic mucosa. - No active inflammation. - No granulomas. - No dysplasia or malignancy.  G. RECTUM, BIOPSY: - Benign colonic mucosa. - No active inflammation. - No granulomas. - No dysplasia or malignancy.  COMMENT:  A-E. The findings are consistent with inflammatory bowel disease and the features favor Crohn's.     Colonoscopy  04/17/18: - The examined portion of the ileum was normal. - Congested mucosa in the ascending colon and in the cecum. Biopsied. - External hemorrhoids.  Colon, biopsy, cecal and ascending - BENIGN COLONIC MUCOSA WITH UNDERLYING LYMPHOID AGGREGATES. - NO DYSPLASIA OR MALIGNANCY.     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 The biopsies show multiple portal-based granulomas, some with typical 'florid duct lesions'. Similar to overall inflammation, there is patchy but significant bile duct injury including changes consistent with patchy ductopenia. There is  associated mild ductular reaction and chronic cholestasis (confirmed with immunohistochemical stains for CK7). Many of the portal tracts also show onion skin-like periductal fibrosis and focal bile duct scars. The trichrome and reticulin stains show patchy, mild portal fibrosis (please note that the degree of fibrosis can be sometimes slightly exaggerated due to subcapsular nature of the biopsy). Patient's elevated serum alkaline phosphatase levels and negative anti-mitochondrial antibody titer are noted. 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 Differential diagnosis can include sarcoidosis and primary sclerosing cholangitis (small duct type) but appear to be less likely. Rarely, drugs can cause biliary tract patterns of injury with  granulomas. PASD stain is negative for intracytoplasmic globules. Iron stain is negative for stainable iron.     Fecal calprotectin 06/06/22: 2850   QG negative Hep B surface antigen and hep B core AB negative AMA negative GGT 103 Vit D - 12.77 CRP 12     RUQ US  elastography 06/18/22: IMPRESSION: ULTRASOUND RUQ: Increased parenchymal echogenicity, suggesting hepatic steatosis. ULTRASOUND HEPATIC ELASTOGRAPHY: Median kPa:  3.3 Diagnostic category:  < or = 5 kPa: high probability of being normal    IgG4 45   MRCP 09/22/2022: IMPRESSION: 1. No MRI evidence of hepatic fibrosis. 2. Visualized portions of the colon demonstrate loss of haustration and some submucosal fibrofatty deposition, nonspecific but can be seen in the setting of chronic inflammatory bowel disease.       Fecal calprotectin 06/06/2022 - 2850   Fecal calprotectin 11/08/22 - 2680  37 y.o. female here for assessment of the following   1. Crohn's disease of both small and large intestine with other complication (HCC)   2. High risk medication use   3. Primary biliary cholangitis (HCC)   4. Metabolic dysfunction-associated steatotic liver disease (MASLD)   5. Morbid obesity (HCC)     Patient has significantly active Crohn's disease, paradoxical worsening with Lialda , failure of improvement on Entyvio .  Recently changed to Skyrizi  and clinically doing better.  She has completed her infusions and now over due to start injectable therapy but for some reason has not had any confirmation of getting the drug nor education about it.  I will reach out to our nursing staff to make sure outpatient injectable Skyrizi  has been ordered / approved and we should start this ASAP. Will place request to get 360mg  subcutaneous every 12 weeks.  I discussed risks of the therapy with her and she understands and wishes to continue.  When she is on stable dosing of this we will repeat fecal calprotectin.  I will recheck CBC today to reassess  her platelet count and WBC which were elevated previously, suspect reactive to her colitis.   Otherwise following closely with hepatology for her PBC and fatty liver.  Elevated alk phos appears to be coming from her bowel inflammation.  Hepatology is monitoring, continuing ursodiol .  She has been referred to bariatrics for weight loss.  I think any of these drugs are okay to start for her weight loss in relation to her Crohn's.  In fact given her fatty liver disease and diarrhea, GLP-1 agonist I think would help her fatty liver, that may be preferable initial regimen for her if we can get her Crohn's disease a bit better controlled.  She understands and will ask Dr. Zara about this.  PLAN: - talk with nursing staff about ordering / approval for Skyrizi  360mg  subcutaneous q 12 weeks, start ASAP - CBC today - fecal calprotectin in a few months  - f/u clinic 4 months - continue ursodiol  - no change in med, AP elevation seems related to her bowel - follow up with weight loss clinic - okay to use GLP1 agonist or other regimens for weight loss from my perspective    Fecal calprotectin 07/30/23 - 658   Colonoscopy 09/04/23:- The perianal and digital rectal examinations were normal. - Mild inflammation was found in the terminal ileum. Biopsies were taken with a cold forceps for histology. - Inflammation was found in a continuous and circumferential pattern from the rectum to the cecum. This was graded as varying between Mayo 2 and 3. Milder in the distal left colon. Biopsies were taken with a cold forceps for histology. - A 5 mm polyp was found in the sigmoid colon. The polyp was sessile. Biopsies were taken with a cold forceps for histology. I did not remove it given significant inflammation, likely inflammatory polyp, but will rule out adenoma. - The exam was otherwise without abnormality. Of note, retroflexed views not obtained due to inflammatory changes.  FINAL DIAGNOSIS       1. Surgical  [P], small bowel, ileum :      MILD ACUTE ILEITIS      CMV STAIN NEGATIVE (I[facility], ADEQUATE CONTROL)       2. Surgical [P], R colon :      CHRONIC ACTIVE COLITIS WITH ULCERATION      NEGATIVE FOR DYSPLASIA AND GRANULOMAS      CMV STAIN NEGATIVE ([facility], ADEQUATE CONTROL)       3. Surgical [P], colon, transverse :      CHRONIC ACTIVE COLITIS WITH EROSION      NEGATIVE FOR DYSPLASIA AND GRANULOMAS      CMV STAIN NEGATIVE ([facility], ADEQUATE CONTROL)       4. Surgical [P], left colon :      CHRONIC ACTIVE COLITIS WITH EROSION      NEGATIVE FOR DYSPLASIA AND GRANULOMAS      CMV STAIN NEGATIVE ([facility], ADEQUATE CONTROL)       5. Surgical [P], colon, sigmoid, polyp (1) :      BENIGN INFLAMMATORY/GRANULATION TISSUE POLYP      NEGATIVE FOR DYSPLASIA AND GRANULOMAS      CMV STAIN NEGATIVE ([facility], ADEQUATE CONTROL)    I called patient and reviewed the results of her recent colonoscopy and pathology results.  She tested negative for CMV.  She has active colitis throughout her colon and ileum.  I researched if Skyrizi  can be used at any higher dosing than every 8 weeks, there is no evidence to show that beneficial and I cannot find any case reports or evidence to support doing that.  Unfortunately I think she has failed Skyrizi  and we discussed other options.   Given her failure of Skyrizi  and Entyvio , with her persistent colitis I think she has severe disease and would warrant either anti-TNF or Rinvoq as best option to treat her.  Given she has not been exposed to anti-TNF I think that is her next best option as Rinvoq would not be approved without that.  Specifically I am recommending Remicade  or bio similar.  We discussed what this is, risks and benefits of this regimen, including infection, lymphoma etc., she understands and is agreeable to proceed with that if approved.  We also discussed starting a thiopurine  with this as it can help make the Remicade  worked better and prevent immunogenicity.  We did discuss  risks of this in combination therapy.  Increased risk for infection, lymphoma, pancreatitis, leukemia etc.  She understands these risks are generally rare and will need to keep close monitoring if we do this.  I will place orders to the infusion center for Remicade .  Will cancel Skyrizi .  She is on budesonide  in the interim to cover her symptoms and hopefully that will make her feel better she does need labs prior to starting thiopurine and Remicade .  She will come to the lab later this week to get it checked.  She will take th budesonide  until she starts Remicade      Jan can you help order the following and the patient will go to the lab: - QuantiFERON gold -Hepatitis B surface antigen -Hepatitis B core antibody   Can you also help book her a follow-up visit with me in a few months if she already does not have an appointment, for Crohn's disease   I will order the Remicade  infusion.  Thanks   Switched to Remicade ?     Past Medical History:  Diagnosis Date   Anemia    Breast nodule 10/10/2014   Breast pain 10/10/2014   BV (bacterial vaginosis) 11/12/2012   Crohn's disease (HCC)    Depression    Fatty liver    Hashimoto's disease    Hypothyroid 02/14/2015   Irregular menses 05/21/2013   Migraines    Obesity    Osteopenia 2021   Other and unspecified ovarian cyst 05/31/2013   Right complex ?cystic vs solid mass on ovary will schedule appt with [facility]   Pregnant 02/14/2015   Primary biliary cholangitis (HCC)    Thyroid  disease    Reported Hashimoto's Thyroiditis in past     Past Surgical History:  Procedure Laterality Date   BIOPSY  04/17/2018   Procedure: BIOPSY;  Surgeon: Golda Claudis PENNER, MD;  Location: [facility];  Service: Endoscopy;;  colon   BIOPSY  05/05/2020   Procedure: BIOPSY;  Surgeon: Eartha Angelia Sieving, MD;  Location: [facility];  Service: Gastroenterology;;   CESAREAN SECTION     COLONOSCOPY     COLONOSCOPY WITH PROPOFOL  N/A 04/17/2018    Procedure: COLONOSCOPY WITH PROPOFOL ;  Surgeon: Golda Claudis PENNER, MD;  Location: [facility];  Service: Endoscopy;  Laterality: N/A;  10:50   COLONOSCOPY WITH PROPOFOL  N/A 05/05/2020   Procedure: COLONOSCOPY WITH PROPOFOL ;  Surgeon: Eartha Angelia Sieving, MD;  Location: [facility];  Service: Gastroenterology;  Laterality: N/A;  AM   LIVER BIOPSY N/A 08/17/2018   Procedure: LIVER BIOPSY;  Surgeon: Kallie Manuelita BROCKS, MD;  Location: [facility];  Service: General;  Laterality: N/A;   OOPHORECTOMY Right 10/2013   TONSILLECTOMY     TUBAL LIGATION Bilateral 08/17/2018   Procedure: LAPAROSCOPIC BILATERAL TUBAL LIGATION WITH FALLOPE RINGS;  Surgeon: Edsel Norleen GAILS, MD;  Location: [facility];  Service: Gynecology;  Laterality: Bilateral;   VENTRAL HERNIA REPAIR N/A 08/17/2018   Procedure: LAPAROSCOPIC VENTRAL HERNIA WITH MESH;  Surgeon: Kallie Manuelita BROCKS, MD;  Location: [facility];  Service: General;  Laterality: N/A;   WISDOM TOOTH EXTRACTION     Family History  Problem Relation Age of Onset   Diabetes Mother    Hypertension Mother    Heart attack Mother    Stroke Mother    Seizures Father    Other Father  back problems   Cancer Maternal Aunt    Diabetes Maternal Uncle    Diabetes Maternal Grandmother    Hypertension Maternal Grandmother    Cancer Maternal Grandmother        breast   Diabetes Maternal Grandfather    Hypertension Maternal Grandfather    Leukemia Maternal Grandfather    AAA (abdominal aortic aneurysm) Maternal Grandfather    Dementia Paternal Grandmother    Kidney disease Paternal Grandfather    Colon cancer Cousin    Ovarian cancer Cousin    Stomach cancer Other    Ulcerative colitis Neg Hx    Crohn's disease Neg Hx    Esophageal cancer Neg Hx    Rectal cancer Neg Hx    Social History   Tobacco Use   Smoking status: Former    Current packs/day: 0.00    Average packs/day: 0.3 packs/day for 8.0 years (2.0 ttl pk-yrs)    Types: Cigarettes    Start date:  10/07/2002    Quit date: 10/07/2010    Years since quitting: 13.2   Smokeless tobacco: Never  Vaping Use   Vaping status: Never Used  Substance Use Topics   Alcohol use: Yes    Comment: rare   Drug use: No   Current Outpatient Medications  Medication Sig Dispense Refill   budesonide  (ENTOCORT EC ) 3 MG 24 hr capsule Take 3 capsules (9 mg total) by mouth daily. 90 capsule 1   buPROPion (WELLBUTRIN XL) 150 MG 24 hr tablet Take 150 mg by mouth every morning.     Calcium  Carbonate-Vit D-Min (CALCIUM  1200) 1200-1000 MG-UNIT CHEW Chew 1 tablet by mouth daily. 30 tablet 0   calcium -vitamin D  (OSCAL WITH D) 500-200 MG-UNIT tablet Take 1 tablet by mouth daily with breakfast. 90 tablet 1   cholecalciferol (VITAMIN D3) 25 MCG (1000 UNIT) tablet Take 1,000 Units by mouth daily.     cyclobenzaprine  (FLEXERIL ) 10 MG tablet Take 10 mg by mouth.     dicyclomine  (BENTYL ) 10 MG capsule Take 1 capsule (10 mg total) by mouth every 12 (twelve) hours as needed for spasms. 60 capsule 1   ferrous sulfate 324 MG TBEC Take 324 mg by mouth every other day.     levothyroxine  (SYNTHROID ) 175 MCG tablet Take 175 mcg by mouth daily.     LIVDELZI 10 MG CAPS      norelgestromin -ethinyl estradiol  (XULANE) 150-35 MCG/24HR transdermal patch Place 1 patch onto the skin once a week. 9 patch 4   OVER THE COUNTER MEDICATION Take 200 mg by mouth daily. Magnesium     Phentermine HCl 8 MG TABS Take once daily around noon     topiramate (TOPAMAX) 15 MG capsule Take 15 mg by mouth 2 (two) times daily.     Vitamin D , Ergocalciferol , (DRISDOL) 1.25 MG (50000 UNIT) CAPS capsule Take 50,000 Units by mouth every 7 (seven) days.     No current facility-administered medications for this visit.   Allergies  Allergen Reactions   Imitrex [Sumatriptan] Other (See Comments)    Makes migraines worse.    Maxalt [Rizatriptan] Other (See Comments)    Makes migraines worse   Phenergan [Promethazine Hcl] Nausea And Vomiting    Pill form; IV ok    Tape Other (See Comments)    Blisters on abdomen after C Section     Review of Systems: All systems reviewed and negative except where noted in HPI.    No results found.  Physical Exam: There were no vitals taken  for this visit. Constitutional: Pleasant,well-developed, ***female in no acute distress. HEENT: Normocephalic and atraumatic. Conjunctivae are normal. No scleral icterus. Neck supple.  Cardiovascular: Normal rate, regular rhythm.  Pulmonary/chest: Effort normal and breath sounds normal. No wheezing, rales or rhonchi. Abdominal: Soft, nondistended, nontender. Bowel sounds active throughout. There are no masses palpable. No hepatomegaly. Extremities: no edema Lymphadenopathy: No cervical adenopathy noted. Neurological: Alert and oriented to person place and time. Skin: Skin is warm and dry. No rashes noted. Psychiatric: Normal mood and affect. Behavior is normal.   ASSESSMENT: 38 y.o. female here for assessment of the following  No diagnosis found.  PLAN:   Job Bolt, [place]

## 2023-12-29 ENCOUNTER — Ambulatory Visit: Payer: Self-pay | Admitting: Gastroenterology

## 2023-12-29 ENCOUNTER — Other Ambulatory Visit: Payer: Self-pay | Admitting: Gastroenterology

## 2023-12-29 ENCOUNTER — Ambulatory Visit (INDEPENDENT_AMBULATORY_CARE_PROVIDER_SITE_OTHER): Admitting: Gastroenterology

## 2023-12-29 ENCOUNTER — Encounter: Payer: Self-pay | Admitting: Gastroenterology

## 2023-12-29 ENCOUNTER — Telehealth: Payer: Self-pay | Admitting: Gastroenterology

## 2023-12-29 VITALS — BP 100/60 | HR 104 | Ht 64.0 in | Wt 277.0 lb

## 2023-12-29 DIAGNOSIS — K760 Fatty (change of) liver, not elsewhere classified: Secondary | ICD-10-CM

## 2023-12-29 DIAGNOSIS — K50818 Crohn's disease of both small and large intestine with other complication: Secondary | ICD-10-CM | POA: Diagnosis not present

## 2023-12-29 DIAGNOSIS — K743 Primary biliary cirrhosis: Secondary | ICD-10-CM

## 2023-12-29 DIAGNOSIS — R197 Diarrhea, unspecified: Secondary | ICD-10-CM

## 2023-12-29 DIAGNOSIS — J019 Acute sinusitis, unspecified: Secondary | ICD-10-CM

## 2023-12-29 DIAGNOSIS — Z79899 Other long term (current) drug therapy: Secondary | ICD-10-CM

## 2023-12-29 MED ORDER — BUDESONIDE ER 9 MG PO TB24: 9.0000 mg | ORAL_TABLET | Freq: Every day | ORAL | 0 refills | Status: DC

## 2023-12-29 MED ORDER — AMOXICILLIN-POT CLAVULANATE 875-125 MG PO TABS: 1.0000 | ORAL_TABLET | Freq: Two times a day (BID) | ORAL | 0 refills | Status: AC

## 2023-12-29 NOTE — Telephone Encounter (Signed)
 I entered new orders for Remicade  10mg /kg every 8 weeks. Thank you!

## 2023-12-29 NOTE — Telephone Encounter (Signed)
 Dr. Leigh, Please enter a new treatment plan and I will submit the auth today and f/u once I have a response from the insurance. Thanks Luke

## 2023-12-29 NOTE — Patient Instructions (Addendum)
 Your provider has ordered [employer]athe[employer] stool testing for you. You have received a kit from our office today containing all necessary supplies to complete this test. Please carefully read the stool collection instructions provided in the kit before opening the accompanying materials. In addition, be sure there is a label providing your full name and date of birth on the puritan opti-swab tube that is supplied in the kit (if you do not see a label with this information on your test tube, please make us  aware before test collection!). After completing the test, you should secure the purtian tube into the specimen biohazard bag. The [facility] E-Req sheet (including date and time of specimen collection) should be placed into the outside pocket of the specimen biohazard bag and returned to the [facility] (basement floor of [facility]) within 3 days of collection. Please make sure to give the specimen to a staff member at the lab. DO NOT leave the specimen on the counter.   If the specimen date and time (can be found in the upper right boxed portion of the sheet) are not filled out on the E-Req sheet, the test will NOT be performed.     We have sent the following medications to your pharmacy for you to pick up at your convenience: Augmentin: Take two times daily Uceris  9mg : Take once daily  You have been scheduled for a follow up visit in January.  Thank you for entrusting me with your care and for choosing [facility], Dr. Elspeth Naval    _______________________________________________________  If your blood pressure at your visit was 140/90 or greater, please contact your primary care physician to follow up on this.  _______________________________________________________  If you are age 35 or older, your body mass index should be between 23-30. Your Body mass index is 47.55 kg/m. If this is out of the aforementioned range listed, please consider  follow up with your Primary Care Provider.  If you are age 61 or younger, your body mass index should be between 19-25. Your Body mass index is 47.55 kg/m. If this is out of the aformentioned range listed, please consider follow up with your Primary Care Provider.   ________________________________________________________  The [facility] providers would like to encourage you to use MYCHART to communicate with providers for non-urgent requests or questions.  Due to long hold times on the telephone, sending your provider a message by [facility] may be a faster and more efficient way to get a response.  Please allow 48 business hours for a response.  Please remember that this is for non-urgent requests.  _______________________________________________________  [facility] is using a team-based approach to care.  Your team is made up of your doctor and two to three APPS. Our APPS (Nurse Practitioners and Physician Assistants) work with your physician to ensure care continuity for you. They are fully qualified to address your health concerns and develop a treatment plan. They communicate directly with your gastroenterologist to care for you. Seeing the Advanced Practice Practitioners on your physician's team can help you by facilitating care more promptly, often allowing for earlier appointments, access to diagnostic testing, procedures, and other specialty referrals.

## 2023-12-29 NOTE — Telephone Encounter (Signed)
 Suzen - I would like to put in a request to change this patient's Remicade  dosing to 10 mg/kg for her next dose that scheduled on 12/1, and future dosing moving forward (10mg /kg every 8 weeks).  Can you please help process this through pharmacy/get approval?  Do need new orders for me for this?  Pod A  RN, RICK

## 2023-12-30 NOTE — Telephone Encounter (Signed)
 Dr. Armbruster, The increase has been approved.  Auth Submission: approved - increase in dose Site of care: Site of care: [facility] Payer: BC[employer] ANTHEM Medication & CPT/J Code(s) submitted: Remicade  (Infliximab ) J1745 Diagnosis Code:  Route of submission (phone, fax, portal):  Phone #[phone] Fax #[phone] Auth type: Buy/Bill PB Units/visits requested: 10MG /KG [id number] x7 doses (DOSE HAS INCREASED FROM 5MG /KG) Reference number: [id number] Approval from:  12/30/23 - 12/29/24

## 2023-12-31 ENCOUNTER — Other Ambulatory Visit (HOSPITAL_COMMUNITY): Payer: Self-pay

## 2023-12-31 NOTE — Telephone Encounter (Signed)
 Insurance will not pay for 9MG . Previously changed to the 3MG  #90 per 30 for co-pay $4.00

## 2024-01-01 MED ORDER — BUDESONIDE 3 MG PO CPEP: 9.0000 mg | ORAL_CAPSULE | Freq: Every day | ORAL | 0 refills | Status: AC

## 2024-01-02 ENCOUNTER — Encounter: Payer: Self-pay | Admitting: Gastroenterology

## 2024-01-02 ENCOUNTER — Ambulatory Visit (INDEPENDENT_AMBULATORY_CARE_PROVIDER_SITE_OTHER): Admitting: Allergy & Immunology

## 2024-01-02 ENCOUNTER — Encounter: Payer: Self-pay | Admitting: Oncology

## 2024-01-02 VITALS — BP 100/74 | HR 89 | Temp 98.5°F | Ht 64.0 in | Wt 283.0 lb

## 2024-01-02 DIAGNOSIS — K50818 Crohn's disease of both small and large intestine with other complication: Secondary | ICD-10-CM | POA: Diagnosis not present

## 2024-01-02 DIAGNOSIS — R21 Rash and other nonspecific skin eruption: Secondary | ICD-10-CM

## 2024-01-02 NOTE — Telephone Encounter (Signed)
 Great, thank you. I think she has an infusion scheduled within 2 weeks, I hope the higher dose helps her.

## 2024-01-02 NOTE — Progress Notes (Unsigned)
 NEW PATIENT  Date of Service/Encounter:  01/02/24  Consult requested by: [place], [place], [place]   Assessment:   No diagnosis found.  Plan/Recommendations:   There are no Patient Instructions on file for this visit.   {Blank single:19197::This note in its entirety was forwarded to the Provider who requested this consultation.}  Subjective:   [name] is a 38 y.o. female presenting today for evaluation of  Chief Complaint  Patient presents with   Establish Care    Pt has Crohn's Disease, she hs been in a constant flare for the past 2 years, her and her GI MD would like to r/o anything that may be triggering flare ups.    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   Encounter for tubal ligation 08/17/2018   Elevated liver function tests    Transaminitis 04/23/2018   Supraumbilical hernia without gangrene and without obstruction 04/09/2018   Normal labor 01/31/2018   Breech presentation 01/18/2018   History of vaginal delivery following previous cesarean delivery 07/02/2017   UTI (urinary tract infection) during pregnancy, first trimester 06/23/2017   Anxiety 05/30/2015   H/O cold sores 05/02/2015   Previous cesarean section complicating pregnancy 03/07/2015   S/P right oophorectomy 03/07/2015   Hypothyroidism 02/14/2015   Breast pain 10/10/2014    History obtained from: chart review and {Persons; PED relatives w/patient:19415::patient}.  Discussed the use of AI scribe software for clinical note transcription with the patient and/or guardian, who gave verbal consent to proceed.  [name] was referred by Job Bolt, PA.     [name] is a 38 y.o. female presenting for {Blank single:19197::a food challenge,a drug challenge,skin testing,a sick visit,an evaluation of ***,a follow up visit}.    Asthma/Respiratory Symptom History: ***  Allergic Rhinitis Symptom History: ***  Food Allergy Symptom History: ***  Skin Symptom History: ***  GERD Symptom History: ***  Infection Symptom History: ***  ***Otherwise, there is no history of other atopic diseases, including {Blank multiple:19196:o:asthma,food allergies,drug allergies,environmental allergies,stinging insect allergies,eczema,urticaria,contact dermatitis}. There is no significant infectious history. ***Vaccinations are up to date.    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   Encounter for tubal ligation 08/17/2018   Elevated liver function tests    Transaminitis 04/23/2018   Supraumbilical hernia without gangrene and without obstruction 04/09/2018   Normal labor 01/31/2018   Breech presentation 01/18/2018   History of vaginal delivery following previous cesarean delivery 07/02/2017   UTI (urinary tract infection) during pregnancy, first trimester 06/23/2017   Anxiety 05/30/2015   H/O cold sores 05/02/2015   Previous cesarean section complicating pregnancy 03/07/2015   S/P right oophorectomy 03/07/2015   Hypothyroidism 02/14/2015   Breast pain 10/10/2014    Medication List:  Allergies as of 01/02/2024       Reactions   Imitrex [sumatriptan] Other (See Comments)   Makes migraines  worse.    Maxalt [rizatriptan] Other (See Comments)    Makes migraines worse   Phenergan [promethazine Hcl] Nausea And Vomiting   Pill form; IV ok   Tape Other (See Comments)   Blisters on abdomen after C Section        Medication List        Accurate as of January 02, 2024  9:51 AM. If you have any questions, ask your nurse or doctor.          amoxicillin -clavulanate 875-125 MG tablet Commonly known as: AUGMENTIN  Take 1 tablet by mouth 2 (two) times daily for 7 days.   budesonide  3 MG 24 hr capsule Commonly known as: ENTOCORT EC  Take 3 capsules (9 mg total) by mouth daily.   buPROPion 150 MG 24 hr tablet Commonly known as: WELLBUTRIN XL Take 150 mg by mouth every morning.   Calcium  1200 1200-1000 MG-UNIT Chew Chew 1 tablet by mouth daily.   calcium -vitamin D  500-200 MG-UNIT tablet Commonly known as: OSCAL WITH D Take 1 tablet by mouth daily with breakfast.   cholecalciferol 25 MCG (1000 UNIT) tablet Commonly known as: VITAMIN D3 Take 1,000 Units by mouth daily.   cyclobenzaprine  10 MG tablet Commonly known as: FLEXERIL  Take 10 mg by mouth.   dicyclomine  10 MG capsule Commonly known as: Bentyl  Take 1 capsule (10 mg total) by mouth every 12 (twelve) hours as needed for spasms.   ferrous sulfate 324 MG Tbec Take 324 mg by mouth every other day.   levothyroxine  175 MCG tablet Commonly known as: SYNTHROID  Take 175 mcg by mouth daily.   Livdelzi 10 MG Caps Generic drug: Seladelpar Lysine   methocarbamol 500 MG tablet Commonly known as: ROBAXIN Take 1,000 mg by mouth 4 (four) times daily.   OVER THE COUNTER MEDICATION Take 200 mg by mouth daily. Magnesium   Remicade  100 MG injection Generic drug: inFLIXimab  Every 4 weeks   Selenium 200 MCG Caps Take by mouth.        Birth History: {Blank single:19197::non-contributory,born premature and spent time in the NICU,born at term without complications}  Developmental History: [name] has met all milestones on time. She has required no {Blank  multiple:19196:a:speech therapy,occupational therapy,physical therapy}. ***non-contributory  Past Surgical History: Past Surgical History:  Procedure Laterality Date   BIOPSY  04/17/2018   Procedure: BIOPSY;  Surgeon: Golda Claudis PENNER, MD;  [facility];  Service: Endoscopy;;  colon   BIOPSY  05/05/2020   Procedure: BIOPSY;  Surgeon: Eartha Angelia Sieving, MD;  [facility];  Service: Gastroenterology;;   CESAREAN SECTION     COLONOSCOPY     COLONOSCOPY WITH PROPOFOL  N/A 04/17/2018   Procedure: COLONOSCOPY WITH PROPOFOL ;  Surgeon: Golda Claudis PENNER, MD;  [facility];  Service: Endoscopy;  Laterality: N/A;  10:50   COLONOSCOPY WITH PROPOFOL  N/A 05/05/2020   Procedure: COLONOSCOPY WITH PROPOFOL ;  Surgeon: Eartha Angelia Sieving, MD;  [facility];  Service: Gastroenterology;  Laterality: N/A;  AM   LIVER BIOPSY N/A 08/17/2018   Procedure: LIVER BIOPSY;  Surgeon: Kallie Manuelita BROCKS, MD;  Location: [facility];  Service: General;  Laterality: N/A;   OOPHORECTOMY Right 10/2013   TONSILLECTOMY     TUBAL LIGATION Bilateral 08/17/2018   Procedure: LAPAROSCOPIC BILATERAL TUBAL LIGATION WITH FALLOPE RINGS;  Surgeon: Edsel Norleen GAILS, MD;  Location: [facility];  Service: Gynecology;  Laterality: Bilateral;   VENTRAL HERNIA REPAIR N/A 08/17/2018   Procedure: LAPAROSCOPIC VENTRAL HERNIA WITH MESH;  Surgeon:  Kallie Manuelita BROCKS, MD;  Location: [facility];  Service: General;  Laterality: N/A;   WISDOM TOOTH EXTRACTION       Family History: Family History  Problem Relation Age of Onset   Diabetes Mother    Hypertension Mother    Heart attack Mother    Stroke Mother    Seizures Father    Other Father        back problems   Cancer Maternal Aunt    Diabetes Maternal Uncle    Diabetes Maternal Grandmother    Hypertension Maternal Grandmother    Cancer Maternal Grandmother        breast   Diabetes Maternal Grandfather    Hypertension Maternal  Grandfather    Leukemia Maternal Grandfather    AAA (abdominal aortic aneurysm) Maternal Grandfather    Dementia Paternal Grandmother    Kidney disease Paternal Grandfather    Colon cancer Cousin    Ovarian cancer Cousin    Stomach cancer Other    Ulcerative colitis Neg Hx    Crohn's disease Neg Hx    Esophageal cancer Neg Hx    Rectal cancer Neg Hx      Social History: Brettany lives at home with ***. She lives in a house that was built in 1946. There is wood flooring throughout he home. They have gas heating and central cooling with window units. She has a snake and a lizard in the home. There are dust mite covering on the bed, but not the pillows. She does do a lot of vaping. 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 She does have exposure to fumes, chemicals and dust. [place].    Review of systems otherwise negative other than that mentioned in the HPI.    Objective:   Blood pressure 100/74, pulse 89, temperature 98.5 F (36.9 C), temperature source Temporal, height 5' 4 (1.626 m), weight 283 lb (128.4 kg), SpO2 99%. Body mass index is 48.58 kg/m.     Physical Exam   Diagnostic studies: {Blank single:19197::none,deferred due to recent antihistamine use,deferred due to insurance stipulations that require a separate visit for testing,labs sent instead, }  Spirometry: {Blank single:19197::results normal (FEV1: ***%, FVC: ***%, FEV1/FVC: ***%),results abnormal (FEV1: ***%, FVC: ***%, FEV1/FVC: ***%)}.    {Blank single:19197::Spirometry consistent with mild obstructive disease,Spirometry consistent with moderate obstructive disease,Spirometry consistent with severe obstructive disease,Spirometry consistent with possible restrictive disease,Spirometry consistent with mixed obstructive and restrictive disease,Spirometry uninterpretable due to technique,Spirometry consistent with normal pattern}. {Blank  single:19197::Albuterol /Atrovent nebulizer,Xopenex/Atrovent nebulizer,Albuterol  nebulizer,Albuterol  four puffs via MDI,Xopenex four puffs via MDI} treatment given in clinic with {Blank single:19197::significant improvement in FEV1 per ATS criteria,significant improvement in FVC per ATS criteria,significant improvement in FEV1 and FVC per ATS criteria,improvement in FEV1, but not significant per ATS criteria,improvement in FVC, but not significant per ATS criteria,improvement in FEV1 and FVC, but not significant per ATS criteria,no improvement}.  Allergy Studies: {Blank single:19197::none,deferred due to recent antihistamine use,deferred due to insurance stipulations that require a separate visit for testing,labs sent instead, }    {Blank single:19197::Allergy testing results were read and interpreted by myself, documented by clinical staff., }         Marty Shaggy, MD [facility] 

## 2024-01-02 NOTE — Patient Instructions (Addendum)
 1. Crohn's disease - followed by Dr. Godfrey - We will do some testing to look any food allergic triggers. - Typically food allergies do not present exactly like this, but you are an odd one.  - I will get some autoimmune labs just to be thorough.   2. Rash - We are going to get some testing for environmental allergens at the next visit.  - Because of insurance stipulations, we cannot do skin testing on the same day as your first visit. - We are all working to fight this, but for now we need to do two separate visits.  - We will know more after we do testing at the next visit.  - The skin testing visit can be squeezed in at your convenience.  - Then we can make a more full plan to address all of your symptoms. - Be sure to stop your antihistamines for 3 days before this appointment.   3. Return in about 2 weeks (around 01/16/2024) for SKIN TESTING (PEDS ENVIRO + SELECT FOODS). You can have the follow up appointment with Dr. Iva or a Nurse Practicioner (our Nurse Practitioners are excellent and always have Physician oversight!).    Please inform us  of any Emergency Department visits, hospitalizations, or changes in symptoms. Call us  before going to the ED for breathing or allergy symptoms since we might be able to fit you in for a sick visit. Feel free to contact us  anytime with any questions, problems, or concerns.  It was a pleasure to meet you today!  Websites that have reliable patient information: 1. American Academy of Asthma, Allergy, and Immunology: www.aaaai.org 2. Food Allergy Research and Education (FARE): foodallergy.org 3. Mothers of Asthmatics: http://www.asthmacommunitynetwork.org 4. American College of Allergy, Asthma, and Immunology: www.acaai.org      "Like" us  on Facebook and Instagram for our latest updates!      A healthy democracy works best when [employer] participate! Make sure you are registered to vote! If you have moved or changed any of your contact  information, you will need to get this updated before voting! Scan the QR codes below to learn more!

## 2024-01-05 ENCOUNTER — Encounter: Payer: Self-pay | Admitting: Allergy & Immunology

## 2024-01-05 LAB — SEDIMENTATION RATE: Sed Rate: 17 mm/h (ref 0–32)

## 2024-01-05 LAB — ALPHA-GAL PANEL
Allergen Lamb IgE: <0.1 kU/L
Beef IgE: <0.1 kU/L
IgE (Immunoglobulin E), Serum: 11 [IU]/mL (ref 6–495)
O215-IgE Alpha-Gal: <0.1 kU/L
Pork IgE: <0.1 kU/L

## 2024-01-05 LAB — TRYPTASE: Tryptase: 9.2 ug/L (ref 2.2–13.2)

## 2024-01-05 LAB — C-REACTIVE PROTEIN: CRP: 24 mg/L — ABNORMAL HIGH (ref 0–10)

## 2024-01-05 LAB — ANTINUCLEAR ANTIBODIES, IFA

## 2024-01-06 ENCOUNTER — Other Ambulatory Visit (HOSPITAL_COMMUNITY): Payer: Self-pay | Admitting: Gastroenterology

## 2024-01-07 ENCOUNTER — Ambulatory Visit: Payer: Self-pay | Admitting: Allergy & Immunology

## 2024-01-12 ENCOUNTER — Other Ambulatory Visit (HOSPITAL_COMMUNITY): Payer: Self-pay

## 2024-01-12 ENCOUNTER — Encounter: Attending: Gastroenterology | Admitting: Internal Medicine

## 2024-01-12 VITALS — BP 127/83 | HR 80 | Temp 98.2°F | Resp 16

## 2024-01-12 DIAGNOSIS — K50818 Crohn's disease of both small and large intestine with other complication: Secondary | ICD-10-CM | POA: Diagnosis not present

## 2024-01-12 MED ORDER — ACETAMINOPHEN 325 MG PO TABS
650.0000 mg | ORAL_TABLET | Freq: Once | ORAL | Status: AC
  Administered 2024-01-12: 650 mg via ORAL

## 2024-01-12 MED ORDER — METHYLPREDNISOLONE SODIUM SUCC 40 MG IJ SOLR
40.0000 mg | Freq: Once | INTRAMUSCULAR | Status: AC
  Administered 2024-01-12: 40 mg via INTRAVENOUS

## 2024-01-12 MED ORDER — DIPHENHYDRAMINE HCL 25 MG PO CAPS
25.0000 mg | ORAL_CAPSULE | Freq: Once | ORAL | Status: AC
  Administered 2024-01-12: 25 mg via ORAL

## 2024-01-12 MED ORDER — SODIUM CHLORIDE 0.9 % IV SOLN
10.0000 mg/kg | Freq: Once | INTRAVENOUS | Status: AC
  Administered 2024-01-12: 1300 mg via INTRAVENOUS
  Filled 2024-01-12: qty 130

## 2024-01-12 NOTE — Progress Notes (Signed)
 Diagnosis:  Crohn's Disease  Provider:  Leigh Standing MD  Procedure: IV Infusion  IV Type: Peripheral, IV Location: R Antecubital   Remicade  (Infliximab ), Dose: 1300 mg  Infusion Start Time: 1145  Infusion Stop Time: 1321  Post Infusion IV Care: Observation period completed  Discharge: Condition: Good, Destination: Home . AVS Provided  Performed by:  Blanca Selinda SAUNDERS, LPN

## 2024-01-21 ENCOUNTER — Encounter: Payer: Self-pay | Admitting: Allergy & Immunology

## 2024-01-21 ENCOUNTER — Ambulatory Visit (INDEPENDENT_AMBULATORY_CARE_PROVIDER_SITE_OTHER): Admitting: Allergy & Immunology

## 2024-01-21 DIAGNOSIS — K50818 Crohn's disease of both small and large intestine with other complication: Secondary | ICD-10-CM | POA: Diagnosis not present

## 2024-01-21 DIAGNOSIS — R21 Rash and other nonspecific skin eruption: Secondary | ICD-10-CM | POA: Diagnosis not present

## 2024-01-21 NOTE — Progress Notes (Signed)
 FOLLOW UP  Date of Service/Encounter:  01/21/24   Assessment:   Crohn's disease  Concern for a food allergy trigger - with negative testing    Rash   Plan/Recommendations:   1. Crohn's disease - followed by Dr. Godfrey - Testing was negative to all of the foods tested today. - There is a the low positive predictive value of food allergy testing and hence the high possibility of false positives. - In contrast, food allergy testing has a high negative predictive value, therefore if testing is negative we can be relatively assured that they are indeed negative.  - Typically food allergies do not present exactly like this, but you are an odd one.  - Labs were notable for an elevated CRP, so I wonder if this is related to a Crohn's flare or something like that.  - I will send my note to Dr. Leigh.  2. Rash - Testing was only slightly reactive to a couple of trees, but otherwise negative to the environmental allergy testing as well as the selected foods.  - Copy of testing results provided today. - Keep taking pictures of the rash for future reference.   3. Return if symptoms worsen or fail to improve. You can have the follow up appointment with Dr. Iva or a Nurse Practicioner (our Nurse Practitioners are excellent and always have Physician oversight!).   Subjective:   [name] is a 38 y.o. 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   Encounter for tubal ligation 08/17/2018   Elevated liver  function tests    Transaminitis 04/23/2018   Supraumbilical hernia without gangrene and without obstruction 04/09/2018   Normal labor 01/31/2018   Breech presentation 01/18/2018   History of vaginal delivery following previous cesarean delivery 07/02/2017   UTI (urinary tract infection) during pregnancy, first trimester 06/23/2017   Anxiety 05/30/2015   H/O cold sores 05/02/2015   Previous cesarean section complicating pregnancy 03/07/2015   S/P right oophorectomy 03/07/2015   Hypothyroidism 02/14/2015   Breast pain 10/10/2014    History obtained from: chart review and patient.  Discussed the use of AI scribe software for clinical note transcription with the patient and/or guardian, who gave verbal consent to proceed.  [name] is a 38 y.o. female presenting for skin testing. She was last seen on November 21st. We could not do testing because her insurance company does not cover testing on the same day as a New Patient visit. She has been off of all antihistamines 3 days in anticipation of the testing.   Otherwise, there have been no changes to her past medical history, surgical history, family history, or social history.    Review of systems otherwise negative other than that mentioned in the HPI.    Objective:   There were no vitals taken for this visit. There is no height or weight on file to calculate BMI.    Physical exam deferred since this was a skin testing appointment only.   Diagnostic studies:    Allergy Studies:  Pediatric Percutaneous Testing - 01/21/24 0900     Time Antigen Placed 9050    Allergen Manufacturer Jestine    Location Back    Number of Test 30    1. Control-Buffer 50% Glycerol Negative    2. Control-Histamine 2+    3. Bahia Negative    4. Bermuda Negative    5. Johnson Negative    6. Grass Mix, 7 Negative    7. Ragweed Mix Negative    8. Plantain, English Negative    9. Lamb's Quarters Negative    10. Sheep Sorrell Negative    11.  Mugwort, Common Negative    12. Box Elder Negative    13. Cedar, Red Negative    14. Walnut, Black Pollen Negative    15. Red Mullberry --   +/-   16. Ash Mix Negative    17. Birch Mix Negative    18. Cottonwood, Eastern --   +/-   19. Hickory, White Negative    20.SABRA Hay, Eastern Mix Negative    21. Sycamore, Eastern Negative    22. Alternaria Alternata Negative    23. Cladosporium Herbarum Negative    24. Aspergillus Mix Negative    25. Penicillium Mix Negative    26. Dust Mite Mix Negative    27. Cat Hair 10,000 BAU/ml Negative    28. Dog Epithelia Negative    29. Mixed Feathers Negative    30. Cockroach, German Negative          Food Adult Perc - 01/21/24 0900     Time Antigen Placed 9050    Allergen Manufacturer Jestine    Location Back    Number of allergen test 40    1. Peanut Negative    3. Wheat Negative    5. Milk, Cow Negative    7. Egg White, Chicken Negative    8. Shellfish Mix Negative    9. Fish Mix Negative    10. Cashew Negative    11. Walnut Food Negative    12. Almond Negative    13. Hazelnut Negative    14. Pecan Food Negative    15. Pistachio Negative    20. Salmon Negative    21. Flounder Negative    23. Shrimp Negative    24. Crab Negative    28. Oat  Negative    29. Rice Negative    33. Turkey Meat Negative    34. Chicken Meat Negative    35. Pork Negative    38. Tomato Negative    44. Squash Negative    45. Green Pepper Negative    46. Mushrooms Negative    47. Onion Negative    49. Cabbage Negative    50. Carrots Negative    53. Cucumber Negative    54. Grape (White seedless) Negative    57. Banana Negative    58. Apple Negative    59. Peach Negative    63. Cantaloupe Negative    64. Watermelon Negative    66. Chocolate/Cacao Bean Negative    70. Garlic Negative    71. Pepper, Black Negative    72. Mustard Negative          Allergy testing results were read and interpreted by myself, documented by clinical staff.       Marty Shaggy, MD  [facility] 

## 2024-01-21 NOTE — Patient Instructions (Addendum)
 1. Crohn's disease - followed by Dr. Godfrey - Testing was negative to all of the foods tested today. - There is a the low positive predictive value of food allergy testing and hence the high possibility of false positives. - In contrast, food allergy testing has a high negative predictive value, therefore if testing is negative we can be relatively assured that they are indeed negative.  - Typically food allergies do not present exactly like this, but you are an odd one.  - Labs were notable for an elevated CRP, so I wonder if this is related to a Crohn's flare or something like that.  - I will send my note to Dr. Leigh.  2. Rash - Testing was only slightly reactive to a couple of trees, but otherwise negative to the environmental allergy testing as well as the selected foods.  - Copy of testing results provided today. - Keep taking pictures of the rash for future reference.   3. Return if symptoms worsen or fail to improve. You can have the follow up appointment with Dr. Iva or a Nurse Practicioner (our Nurse Practitioners are excellent and always have Physician oversight!).    Please inform us  of any Emergency Department visits, hospitalizations, or changes in symptoms. Call us  before going to the ED for breathing or allergy symptoms since we might be able to fit you in for a sick visit. Feel free to contact us  anytime with any questions, problems, or concerns.  It was a pleasure to see you again today!  Websites that have reliable patient information: 1. American Academy of Asthma, Allergy, and Immunology: www.aaaai.org 2. Food Allergy Research and Education (FARE): foodallergy.org 3. Mothers of Asthmatics: http://www.asthmacommunitynetwork.org 4. American College of Allergy, Asthma, and Immunology: www.acaai.org      Like us  on [employer] and Instagram for our latest updates!      A healthy democracy works best when [employer] participate! Make sure you are  registered to vote! If you have moved or changed any of your contact information, you will need to get this updated before voting! Scan the QR codes below to learn more!        Pediatric Percutaneous Testing - 01/21/24 0900     Time Antigen Placed 9050    Allergen Manufacturer Jestine    Location Back    Number of Test 30    1. Control-Buffer 50% Glycerol Negative    2. Control-Histamine 2+    3. Bahia Negative    4. Bermuda Negative    5. Johnson Negative    6. Grass Mix, 7 Negative    7. Ragweed Mix Negative    8. Plantain, English Negative    9. Lamb's Quarters Negative    10. Sheep Sorrell Negative    11. Mugwort, Common Negative    12. Box Elder Negative    13. Cedar, Red Negative    14. Walnut, Black Pollen Negative    15. Red Mullberry --   +/-   16. Ash Mix Negative    17. Birch Mix Negative    18. Cottonwood, Eastern --   +/-   19. Hickory, White Negative    20.SABRA Hay, Eastern Mix Negative    21. Sycamore, Eastern Negative    22. Alternaria Alternata Negative    23. Cladosporium Herbarum Negative    24. Aspergillus Mix Negative    25. Penicillium Mix Negative    26. Dust Mite Mix Negative    27. Cat Hair 10,000 BAU/ml Negative  28. Dog Epithelia Negative    29. Mixed Feathers Negative    30. Cockroach, German Negative          Food Adult Perc - 01/21/24 0900     Time Antigen Placed 9050    Allergen Manufacturer Jestine    Location Back    Number of allergen test 40    1. Peanut Negative    3. Wheat Negative    5. Milk, Cow Negative    7. Egg White, Chicken Negative    8. Shellfish Mix Negative    9. Fish Mix Negative    10. Cashew Negative    11. Walnut Food Negative    12. Almond Negative    13. Hazelnut Negative    14. Pecan Food Negative    15. Pistachio Negative    20. Salmon Negative    21. Flounder Negative    23. Shrimp Negative    24. Crab Negative    28. Oat  Negative    29. Rice Negative    33. Turkey Meat Negative    34. Chicken  Meat Negative    35. Pork Negative    38. Tomato Negative    44. Squash Negative    45. Green Pepper Negative    46. Mushrooms Negative    47. Onion Negative    49. Cabbage Negative    50. Carrots Negative    53. Cucumber Negative    54. Grape (White seedless) Negative    57. Banana Negative    58. Apple Negative    59. Peach Negative    63. Cantaloupe Negative    64. Watermelon Negative    66. Chocolate/Cacao Bean Negative    70. Garlic Negative    71. Pepper, Black Negative    72. Mustard Negative          Reducing Pollen Exposure  The American Academy of Allergy, Asthma and Immunology suggests the following steps to reduce your exposure to pollen during allergy seasons.    Do not hang sheets or clothing out to dry; pollen may collect on these items. Do not mow lawns or spend time around freshly cut grass; mowing stirs up pollen. Keep windows closed at night.  Keep car windows closed while driving. Minimize morning activities outdoors, a time when pollen counts are usually at their highest. Stay indoors as much as possible when pollen counts or humidity is high and on windy days when pollen tends to remain in the air longer. Use air conditioning when possible.  Many air conditioners have filters that trap the pollen spores. Use a HEPA room air filter to remove pollen form the indoor air you breathe.

## 2024-01-22 ENCOUNTER — Ambulatory Visit: Attending: Internal Medicine | Admitting: Internal Medicine

## 2024-01-22 VITALS — BP 103/72 | HR 86 | Ht 64.0 in | Wt 278.0 lb

## 2024-01-22 DIAGNOSIS — E039 Hypothyroidism, unspecified: Secondary | ICD-10-CM

## 2024-01-22 DIAGNOSIS — K743 Primary biliary cirrhosis: Secondary | ICD-10-CM

## 2024-01-22 DIAGNOSIS — I7121 Aneurysm of the ascending aorta, without rupture: Secondary | ICD-10-CM | POA: Diagnosis not present

## 2024-01-22 NOTE — Patient Instructions (Addendum)
 Medication Instructions:  Your physician recommends that you continue on your current medications as directed. Please refer to the Current Medication list given to you today.   Labwork: None  Testing/Procedures: Your physician has requested that you have an echocardiogram. Echocardiography is a painless test that uses sound waves to create images of your heart. It provides your doctor with information about the size and shape of your heart and how well your hearts chambers and valves are working. This procedure takes approximately one hour. There are no restrictions for this procedure. Please do NOT wear cologne, perfume, aftershave, or lotions (deodorant is allowed). Please arrive 15 minutes prior to your appointment time.  Please note: We ask at that you not bring children with you during ultrasound (echo/ vascular) testing. Due to room size and safety concerns, children are not allowed in the ultrasound rooms during exams. Our front office staff cannot provide observation of children in our lobby area while testing is being conducted. An adult accompanying a patient to their appointment will only be allowed in the ultrasound room at the discretion of the ultrasound technician under special circumstances. We apologize for any inconvenience.   Non-Cardiac CT Angiography (CTA), is a special type of CT scan that uses a computer to produce multi-dimensional views of major blood vessels throughout the body. In CT angiography, a contrast material is injected through an IV to help visualize the blood vessels   Follow-Up: Your physician recommends that you schedule a follow-up appointment in: 1 year. You will receive a reminder call in about 8 months reminding you to schedule your appointment. If you don't receive this call, please contact our office.   Any Other Special Instructions Will Be Listed Below (If Applicable).  Avoid antibiotics with Fluoroquinolones   Thank you for choosing [facility]!     If you need a refill on your cardiac medications before your next appointment, please call your pharmacy.

## 2024-01-22 NOTE — Progress Notes (Signed)
 Cardiology Office Note  Date: 01/22/2024   ID: [name], DOB 18-Apr-1985, MRN [id number]  PCP:  Job Bolt, PA  Cardiologist:  None Electrophysiologist:  None   History of Present Illness: [name] is a 38 y.o. 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  Patient is a engineer, civil (consulting) by profession.  She had a fall at work and underwent CT chest as a part of Workmen's Compensation which was when she was noted to have 4.2 cm ascending aortic dilatation.  No family history of aorta repair.  Does not have any symptoms except for occasional chest pains.  Shortness of breath climbing stairs.  No other issues.  Past Medical History:  Diagnosis Date   Anemia    Aneurysm    in her chest   Breast nodule 10/10/2014   Breast pain 10/10/2014   BV (bacterial vaginosis) 11/12/2012   Compression fracture of spine (HCC)    T-4   Crohn's disease (HCC)    Depression    Fatty liver    Hashimoto's disease    Hypothyroid 02/14/2015   Irregular menses 05/21/2013   Migraines    Obesity    Osteopenia 2021   Other and unspecified ovarian cyst 05/31/2013   Right complex ?cystic vs solid mass on ovary will schedule appt with [facility]   Pregnant 02/14/2015   Primary biliary cholangitis (HCC)    Thyroid  disease    Reported Hashimoto's Thyroiditis in past    Past Surgical History:  Procedure Laterality Date   BIOPSY  04/17/2018   Procedure: BIOPSY;  Surgeon: Golda Claudis PENNER, MD;  [facility];  Service: Endoscopy;;  colon   BIOPSY  05/05/2020   Procedure: BIOPSY;  Surgeon: Eartha Angelia Sieving, MD;  [facility];  Service: Gastroenterology;;   CESAREAN SECTION     COLONOSCOPY     COLONOSCOPY WITH PROPOFOL  N/A 04/17/2018   Procedure: COLONOSCOPY WITH PROPOFOL ;  Surgeon: Golda Claudis PENNER, MD;  [facility];  Service: Endoscopy;   Laterality: N/A;  10:50   COLONOSCOPY WITH PROPOFOL  N/A 05/05/2020   Procedure: COLONOSCOPY WITH PROPOFOL ;  Surgeon: Eartha Angelia Sieving, MD;  [facility];  Service: Gastroenterology;  Laterality: N/A;  AM   LIVER BIOPSY N/A 08/17/2018   Procedure: LIVER BIOPSY;  Surgeon: Kallie Manuelita JAYSON, MD;  Location: [facility];  Service: General;  Laterality: N/A;   OOPHORECTOMY Right 10/2013   TONSILLECTOMY     TUBAL LIGATION Bilateral 08/17/2018   Procedure: LAPAROSCOPIC BILATERAL TUBAL LIGATION WITH FALLOPE RINGS;  Surgeon: Edsel Norleen GAILS, MD;  Location: [facility];  Service: Gynecology;  Laterality: Bilateral;   VENTRAL HERNIA REPAIR N/A 08/17/2018   Procedure: LAPAROSCOPIC VENTRAL HERNIA WITH MESH;  Surgeon: Kallie Manuelita JAYSON, MD;  Location: [facility];  Service: General;  Laterality: N/A;   WISDOM TOOTH EXTRACTION      Current Outpatient Medications  Medication Sig Dispense Refill   budesonide  (ENTOCORT EC ) 3 MG 24 hr capsule Take 3 capsules (9 mg total) by mouth daily. 90 capsule 0   buPROPion (WELLBUTRIN XL) 150 MG 24 hr tablet Take 150 mg by mouth every morning.     Calcium  Carbonate-Vit D-Min (CALCIUM  1200) 1200-1000 MG-UNIT CHEW Chew 1 tablet by mouth daily. 30 tablet 0   calcium -vitamin D  (OSCAL WITH D) 500-200 MG-UNIT tablet Take 1 tablet by mouth daily with breakfast. 90 tablet  1   cholecalciferol (VITAMIN D3) 25 MCG (1000 UNIT) tablet Take 1,000 Units by mouth daily.     cyclobenzaprine  (FLEXERIL ) 10 MG tablet Take 10 mg by mouth.     dicyclomine  (BENTYL ) 10 MG capsule Take 1 capsule (10 mg total) by mouth every 12 (twelve) hours as needed for spasms. 60 capsule 1   ferrous sulfate 324 MG TBEC Take 324 mg by mouth every other day.     inFLIXimab  (REMICADE ) 100 MG injection Every 4 weeks     levothyroxine  (SYNTHROID ) 175 MCG tablet Take 175 mcg by mouth daily.     LIVDELZI 10 MG CAPS      methocarbamol (ROBAXIN) 500 MG tablet Take 1,000 mg by mouth 4 (four) times daily.      OVER THE COUNTER MEDICATION Take 200 mg by mouth daily. Magnesium     Selenium 200 MCG CAPS Take by mouth.     No current facility-administered medications for this visit.   Allergies:  Imitrex [sumatriptan], Maxalt [rizatriptan], Phenergan [promethazine hcl], and Tape   Social History: The patient  reports that she quit smoking about 13 years ago. Her smoking use included cigarettes. She started smoking about 21 years ago. She has a 2 pack-year smoking history. She has never used smokeless tobacco. She reports current alcohol use. She reports that she does not use drugs.   Family History: The patient's family history includes AAA (abdominal aortic aneurysm) in her maternal grandfather; Cancer in her maternal aunt and maternal grandmother; Colon cancer in her cousin; Dementia in her paternal grandmother; Diabetes in her maternal grandfather, maternal grandmother, maternal uncle, and mother; Heart attack in her mother; Hypertension in her maternal grandfather, maternal grandmother, and mother; Kidney disease in her paternal grandfather; Leukemia in her maternal grandfather; Other in her father; Ovarian cancer in her cousin; Seizures in her father; Stomach cancer in an other family member; Stroke in her mother.   ROS:  Please see the history of present illness. Otherwise, complete review of systems is positive for none  All other systems are reviewed and negative.   Physical Exam: VS:  BP 103/72 (BP Location: Right Arm)   Pulse 86   Ht 5' 4 (1.626 m)   Wt 278 lb (126.1 kg)   SpO2 97%   BMI 47.72 kg/m , BMI Body mass index is 47.72 kg/m.  Wt Readings from Last 3 Encounters:  01/22/24 278 lb (126.1 kg)  01/02/24 283 lb (128.4 kg)  12/29/23 277 lb (125.6 kg)    General: Patient appears comfortable at rest. HEENT: Conjunctiva and lids normal, oropharynx clear with moist mucosa. Neck: Supple, no elevated JVP or carotid bruits, no thyromegaly. Lungs: Clear to auscultation, nonlabored  breathing at rest. Cardiac: Regular rate and rhythm, no S3 or significant systolic murmur, no pericardial rub. Abdomen: Soft, nontender, no hepatomegaly, bowel sounds present, no guarding or rebound. Extremities: No pitting edema, distal pulses 2+. Skin: Warm and dry. Musculoskeletal: No kyphosis. Neuropsychiatric: Alert and oriented x3, affect grossly appropriate.  Recent Labwork: 09/08/2023: TSH 8.660 12/08/2023: ALT 32; AST 38; BUN 9; Creatinine, Ser 0.63; Hemoglobin 13.5; Platelets 510; Potassium 4.2; Sodium 140     Component Value Date/Time   CHOL 181 01/24/2020 1132   TRIG 105 01/24/2020 1132   HDL 57 01/24/2020 1132   CHOLHDL 3.2 01/24/2020 1132   LDLCALC 104 (H) 01/24/2020 1132    Other Studies Reviewed Today:   Assessment and Plan:  Ascending aortic aneurysm 4.2 cm in October 2025 at [facility]: Risk  factor include chron's disease on infusions.  Did not achieve remission yet.  Obtain CT angio chest/aorta in 1 year.  Avoid fluoroquinolones.  Obtain echocardiogram now to rule out any aortic valve lesions.  30-minute spent in reviewing prior medical records, more than 3 labs, discussion and documentation.     Medication Adjustments/Labs and Tests Ordered: Current medicines are reviewed at length with the patient today.  Concerns regarding medicines are outlined above.    Disposition:  Follow up 1 year  Signed Kalani Sthilaire Priya Aviv Lengacher, MD, 01/22/2024 1:54 PM    [facility] [address] [place], [place], [place] [address]

## 2024-02-09 ENCOUNTER — Telehealth: Payer: Self-pay | Admitting: Internal Medicine

## 2024-02-09 ENCOUNTER — Ambulatory Visit: Attending: Internal Medicine

## 2024-02-09 ENCOUNTER — Encounter: Payer: Self-pay | Admitting: *Deleted

## 2024-02-09 DIAGNOSIS — I7121 Aneurysm of the ascending aorta, without rupture: Secondary | ICD-10-CM

## 2024-02-09 LAB — ECHOCARDIOGRAM COMPLETE
AR max vel: 3.2 cm2
AV Peak grad: 8.2 mmHg
Ao pk vel: 1.43 m/s
Area-P 1/2: 5.62 cm2
Calc EF: 69.6 %
S' Lateral: 2.2 cm
Single Plane A2C EF: 61.5 %
Single Plane A4C EF: 76.1 %

## 2024-02-09 NOTE — Telephone Encounter (Signed)
 Checking percert on the following patient for testing scheduled at [facility].    CT ANGIO CHEST -01/272026

## 2024-02-10 ENCOUNTER — Ambulatory Visit: Payer: Self-pay | Admitting: Internal Medicine

## 2024-02-18 ENCOUNTER — Ambulatory Visit: Admitting: Internal Medicine

## 2024-03-01 ENCOUNTER — Encounter: Payer: Self-pay | Admitting: Gastroenterology

## 2024-03-01 ENCOUNTER — Encounter: Payer: Self-pay | Admitting: Oncology

## 2024-03-05 ENCOUNTER — Telehealth: Payer: Self-pay

## 2024-03-05 NOTE — Telephone Encounter (Signed)
 Auth Submission: NO AUTH NEEDED Site of care: Site of care: [facility] Payer: [place] [employer]  Medication & CPT/J Code(s) submitted: Remicade  (Infliximab ) J1745 Diagnosis Code:  Route of submission (phone, fax, portal): phone Phone # Fax # Auth type: Buy/Bill HB Units/visits requested: 10mg /kg, q8weeks Reference number:  Approval from: 03/05/24 to 02/10/25

## 2024-03-08 ENCOUNTER — Ambulatory Visit

## 2024-03-09 ENCOUNTER — Ambulatory Visit (HOSPITAL_COMMUNITY)
Admission: RE | Admit: 2024-03-09 | Discharge: 2024-03-09 | Disposition: A | Discharge status: Home / Self Care | Source: Ambulatory Visit | Attending: Internal Medicine | Admitting: Internal Medicine

## 2024-03-09 ENCOUNTER — Ambulatory Visit: Admitting: Gastroenterology

## 2024-03-09 DIAGNOSIS — I7121 Aneurysm of the ascending aorta, without rupture: Secondary | ICD-10-CM | POA: Insufficient documentation

## 2024-03-09 MED ORDER — IOHEXOL 350 MG/ML SOLN
75.0000 mL | Freq: Once | INTRAVENOUS | Status: AC | PRN
  Administered 2024-03-09: 75 mL via INTRAVENOUS

## 2024-03-09 NOTE — Progress Notes (Unsigned)
 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     Recap of major GI issues:   Liver disease: intermittent elevations in her liver enzymes for years.  Denies any family history of liver disease or cirrhosis.  Dates back to at least 2012 with her pregnancy.  She does not drink any alcohol routinely.  She had a serologic workup in 2020 and ultimately a liver biopsy.  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  Stage I fibrosis noted at that time. She had positive ASMA but her IgG levels were normal. She was started on ursodiol .  Prescribed 900 mg twice daily (based on weight, 13 to 15 mg/kg). She has had a history of osteopenia on remote DEXA scan that was done in 2021. She also has a vitamin D  deficiency.   Follows with [facility].  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  MRCP shows no evidence of PSC.  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 Ursodiol  caused diarrhea and now being managed per Hepatology with Livdelzi.   Crohn's disease: Back in 2020 she had an abnormal CT scan in February 2020 showing a suspected ascending colon mass with lymphadenopathy.  Also ventral wall hernia noted.  This led to a colonoscopy the following month which showed some inflammatory changes in the right colon and edema, biopsies were taken and were normal.   She states over time she has developed diarrhea with significant urgency and mucus and rectal bleeding.  This led to a follow-up colonoscopy with Dr. Eartha in March 2022.  She had overt colitis and most of her colon, biopsies showed chronic active colitis.  She also had ileitis on biopsies although the ileum appeared normal.  Of note the rectum and sigmoid colon were normal on biopsies.  It appears she was started on Lialda  to treat this initially.  She states she had paradoxical worsening on this regimen and her  diarrhea and GI symptoms got much worse so she stopped it.  She states he was not aware of any other treatment for her colitis, essentially has not been on anything since that time.   She had been placed on Entyvio  in April 2024.  Fecal calprotectin markedly elevated in the 2000's. Transitioned to Skyrizi  end of 2024. Failed Skyrizi  over time - fecal calprotectin in June 2025 in the 600s, subsequent colonoscopy 08/2023 showed Mayo grade 2-3 pancolitis. Subsequently started on Remicade  5mg /kg every 8 weeks.      SINCE LAST VISIT   Patient has completed induction dosing of Remicade  5 mg/kg.  She is due for her first maintenance dose December 1.  She states she does think that the stool frequency has decreased but she still has loose stools with blood in it that bothers her.  She does not feel that she is in remission.  She just had a fecal calprotectin done this past week and it resulted at over thousand.   Unfortunately she has also felt that she had a flu shot in recent weeks and since that time states she has a sinus infection in her right maxillary sinus.  She has had this before requiring antibiotic therapy.  Persistent facial pain they are very consistent with her prior sinusitis symptoms.  She has not been treated for this yet.   She had a fall at work unfortunately at the end of October.  CT  scan of her back done showing a T-spine compression deformity suspicious for acute fracture.  She also has mild ascending aortic aneurysm to 4.2 cm.  She denies history of hypertension, she is referred to cardiology to further evaluate that.   She is a bit worried about her response to Remicade  thus far and has questions about long-term plan.  We discussed this for a bit.  She does work at a health care facility.  She had a TB exposure and we retested for TB after month that was negative.  She has had 2 negative TB test this year.  Flu shot is up-to-date.  She has also had the Shingrix vaccine as well as the  PCV 21 vaccine.     In regards to her liver disease, she has seen [facility], Stephane Quest since our last visit.  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  MRCP negative. Also component of MALSD. She did not tolerate ursodiol  but is on Livdelzi currently.  She is concerned about this and her recent spine fracture.  Last LFTs in October showed an alk phos of 233, ALT 32, AST 38, T. bili 0.3.  She had an MRCP with them in August of last year, no evidence of obvious fibrosis on that exam.  She sees hepatology a few times yearly     Prior workup: Celiac panel negative 04/27/20   Colonoscopy 05/05/20: - The examined portion of the ileum was normal. Biopsied. - Diffuse mild inflammation was found in the descending colon, in the transverse colon, in the ascending colon and in the cecum secondary to inflammatory bowel disease. Biopsied. - The distal rectum and anal verge are normal on retroflexion view.   FINAL MICROSCOPIC DIAGNOSIS:  A. TERMINAL ILEUM, BIOPSY: - Chronic mildly active ileitis. - No granuloma. - No dysplasia or malignancy.  B. COLON, CECAL, BIOPSY: - Chronic severally active colitis. - No granulomas. - No dysplasia or malignancy.  C. COLON, ASCENDING, BIOPSY: - Chronic moderately active colitis. - No granulomas. - No dysplasia or malignancy.  D. COLON, TRANSVERSE, BIOPSY: - Chronic moderately active colitis. - No granulomas. - No dysplasia or malignancy.  E. COLON, DESCENDING, BIOPSY: - Chronic moderately active colitis. - No granulomas. - No dysplasia or malignancy.  F. COLON, SIGMOID, BIOPSY: - Benign colonic mucosa. - No active inflammation. - No granulomas. - No dysplasia or malignancy.  G. RECTUM, BIOPSY: - Benign colonic mucosa. - No active inflammation. - No granulomas. - No dysplasia or malignancy.  COMMENT:  A-E. The findings are consistent with inflammatory bowel disease and the features favor Crohn's.     Colonoscopy 04/17/18: - The examined  portion of the ileum was normal. - Congested mucosa in the ascending colon and in the cecum. Biopsied. - External hemorrhoids.  Colon, biopsy, cecal and ascending - BENIGN COLONIC MUCOSA WITH UNDERLYING LYMPHOID AGGREGATES. - NO DYSPLASIA OR MALIGNANCY.     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 The biopsies show multiple portal-based granulomas, some with typical 'florid duct lesions'. Similar to overall inflammation, there is patchy but significant bile duct injury including changes consistent with patchy ductopenia. There is associated mild ductular reaction and chronic cholestasis (confirmed  with immunohistochemical stains for CK7). Many of the portal tracts also show onion skin-like periductal fibrosis and focal bile duct scars. The trichrome and reticulin stains show patchy, mild portal fibrosis (please note that the degree of fibrosis can be sometimes slightly exaggerated due to subcapsular nature of the biopsy). Patient's elevated serum alkaline phosphatase levels and negative anti-mitochondrial antibody titer are noted. 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 Differential diagnosis can include sarcoidosis and primary sclerosing cholangitis (small duct type) but appear to be less likely. Rarely, drugs can cause biliary tract patterns of injury with granulomas. PASD stain  is negative for intracytoplasmic globules. Iron stain is negative for stainable iron.     Fecal calprotectin 06/06/22: 2850     RUQ US  elastography 06/18/22: IMPRESSION: ULTRASOUND RUQ: Increased parenchymal echogenicity, suggesting hepatic steatosis. ULTRASOUND HEPATIC ELASTOGRAPHY: Median kPa:  3.3 Diagnostic category:  < or = 5 kPa: high probability of being normal    IgG4 45   MRCP 09/22/2022: IMPRESSION: 1. No MRI evidence of hepatic fibrosis. 2. Visualized portions of the colon demonstrate loss of haustration and some submucosal fibrofatty deposition, nonspecific but can be seen in the setting of chronic inflammatory bowel disease.       Fecal calprotectin 06/06/2022 - 2850   Fecal calprotectin 11/08/22 - 2680    Fecal calprotectin 07/30/23 - 658     Colonoscopy 09/04/23:- The perianal and digital rectal examinations were normal. - Mild inflammation was found in the terminal ileum. Biopsies were taken with a cold forceps for histology. - Inflammation was found in a continuous and circumferential pattern from the rectum to the cecum. This was graded as varying between Mayo 2 and 3. Milder in the distal left colon. Biopsies were taken with a cold forceps for histology. - A 5 mm polyp was found in the sigmoid colon. The polyp was sessile. Biopsies were taken with a cold forceps for histology. I did not remove it given significant inflammation, likely inflammatory polyp, but will rule out adenoma. - The exam was otherwise without abnormality. Of note, retroflexed views not obtained due to inflammatory changes.   FINAL DIAGNOSIS       1. Surgical [P], small bowel, ileum :      MILD ACUTE ILEITIS      CMV STAIN NEGATIVE (IHC, ADEQUATE CONTROL)       2. Surgical [P], R colon :      CHRONIC ACTIVE COLITIS WITH ULCERATION      NEGATIVE FOR DYSPLASIA AND GRANULOMAS      CMV STAIN NEGATIVE (IHC, ADEQUATE CONTROL)       3. Surgical [P], colon, transverse :      CHRONIC ACTIVE  COLITIS WITH EROSION      NEGATIVE FOR DYSPLASIA AND GRANULOMAS      CMV STAIN NEGATIVE (IHC, ADEQUATE CONTROL)       4. Surgical [P], left colon :      CHRONIC ACTIVE COLITIS WITH EROSION      NEGATIVE FOR DYSPLASIA AND GRANULOMAS      CMV STAIN NEGATIVE (IHC, ADEQUATE CONTROL)       5. Surgical [P], colon, sigmoid, polyp (1) :      BENIGN INFLAMMATORY/GRANULATION TISSUE POLYP      NEGATIVE FOR DYSPLASIA AND GRANULOMAS      CMV STAIN NEGATIVE (IHC, ADEQUATE  CONTROL)     Fecal calprotectin 12/23/23 - 1150      39 y.o. female here for assessment of the following   1. Crohn's disease of both small and large intestine with other complication (HCC)   2. High risk medication use   3. Primary biliary cholangitis (HCC)   4. Metabolic dysfunction-associated steatotic liver disease (MASLD)   5. Acute sinusitis, recurrence not specified, unspecified location     Difficult to treat Crohn's disease.  Failed mesalamine , Entyvio , Skyrizi  most recently.  Transitioned to Remicade .  After induction dosing her fecal calprotectin remains greater than thousand and she clinically does not feel that she is in remission.  We discussed options.   She does work at a medical facility, we will recheck her for C. difficile to make sure negative.  I am otherwise going to speak with the infusion center/pharmacy about increasing her dose of Remicade  to 10 mg/kg on December 1 if she is ready for that.  We discussed risks of Remicade , and that of immunosuppression. Unfortunately she has what appears to be sinusitis that has persisted and we will give her a course of Augmentin  for 1 week to treat that.  Hopefully if we can get her sinusitis under control we can treat with a higher dosing of Remicade  and see if we can get her in remission.  If she has response to Remicade , we will need to touch base with hepatology to see if she could go on a thiopurine in light of her liver disease to help maintain remission.  Hopefully  we can capture response at higher dosing, after a few doses will check levels and for antibodies to Remicade .   In the interim, until we can get her feeling better, and her concern for more immunosuppression in the setting of infection, we will see if we can get her covered for Uceris  9 mg daily for 30-day course which should have minimal systemic absorption.  If it is not covered by her insurance she will let me know and we will use generic budesonide . If she fails to respond to Remicade , next option I think would be Rinvoq.   She will continue to see hepatology for her liver disease in the interim and awaiting cardiology evaluation for aneurysm.  I would like to see her back in 3 months or sooner with any issues or failure to response to Remicade .  If she feels that the sinusitis is not cleared and she is not ready for Remicade  in 2 weeks she will let me know   PLAN: - stool for C diff - diatherix swab sent - start Uceris  9mg  / day for 30 days - start Augmentin  875/125mg  BID for one week - treat sinusitis - will try to increase dose of Remicade  to 10mg /kg for next dose and moving forward - will discuss with pharmacy to see if approved for 12/1. Will check levels in a few months - seeing hepatology for [facility] - seeing cardiology for AAA - f/u 3 months or sooner with issues     Remicade  10mg /kg every 8 weeks   Patient did not tolerate ursodiol  due to worsening of Crohn's disease.   She was started on Livdelzi 10 mg daily in April 2025. She denies side effects on treatment.     Past Medical History:  Diagnosis Date   Anemia    Aneurysm    in her chest   Breast nodule 10/10/2014   Breast pain 10/10/2014   BV (bacterial vaginosis) 11/12/2012  Compression fracture of spine (HCC)    T-4   Crohn's disease (HCC)    Depression    Fatty liver    Hashimoto's disease    Hypothyroid 02/14/2015   Irregular menses 05/21/2013   Migraines    Obesity    Osteopenia 2021   Other and  unspecified ovarian cyst 05/31/2013   Right complex ?cystic vs solid mass on ovary will schedule appt with [facility]   Pregnant 02/14/2015   Primary biliary cholangitis (HCC)    Thyroid  disease    Reported Hashimoto's Thyroiditis in past     Past Surgical History:  Procedure Laterality Date   BIOPSY  04/17/2018   Procedure: BIOPSY;  Surgeon: Golda Claudis PENNER, MD;  Location: [facility];  Service: Endoscopy;;  colon   BIOPSY  05/05/2020   Procedure: BIOPSY;  Surgeon: Eartha Angelia Sieving, MD;  Location: [facility];  Service: Gastroenterology;;   CESAREAN SECTION     COLONOSCOPY     COLONOSCOPY WITH PROPOFOL  N/A 04/17/2018   Procedure: COLONOSCOPY WITH PROPOFOL ;  Surgeon: Golda Claudis PENNER, MD;  Location: [facility];  Service: Endoscopy;  Laterality: N/A;  10:50   COLONOSCOPY WITH PROPOFOL  N/A 05/05/2020   Procedure: COLONOSCOPY WITH PROPOFOL ;  Surgeon: Eartha Angelia Sieving, MD;  Location: [facility];  Service: Gastroenterology;  Laterality: N/A;  AM   LIVER BIOPSY N/A 08/17/2018   Procedure: LIVER BIOPSY;  Surgeon: Kallie Manuelita BROCKS, MD;  Location: [facility];  Service: General;  Laterality: N/A;   OOPHORECTOMY Right 10/2013   TONSILLECTOMY     TUBAL LIGATION Bilateral 08/17/2018   Procedure: LAPAROSCOPIC BILATERAL TUBAL LIGATION WITH FALLOPE RINGS;  Surgeon: Edsel Norleen GAILS, MD;  Location: [facility];  Service: Gynecology;  Laterality: Bilateral;   VENTRAL HERNIA REPAIR N/A 08/17/2018   Procedure: LAPAROSCOPIC VENTRAL HERNIA WITH MESH;  Surgeon: Kallie Manuelita BROCKS, MD;  Location: [facility];  Service: General;  Laterality: N/A;   WISDOM TOOTH EXTRACTION     Family History  Problem Relation Age of Onset   Diabetes Mother    Hypertension Mother    Heart attack Mother    Stroke Mother    Seizures Father    Other Father        back problems   Cancer Maternal Aunt    Diabetes Maternal Uncle    Diabetes Maternal Grandmother    Hypertension Maternal Grandmother    Cancer  Maternal Grandmother        breast   Diabetes Maternal Grandfather    Hypertension Maternal Grandfather    Leukemia Maternal Grandfather    AAA (abdominal aortic aneurysm) Maternal Grandfather    Dementia Paternal Grandmother    Kidney disease Paternal Grandfather    Colon cancer Cousin    Ovarian cancer Cousin    Stomach cancer Other    Ulcerative colitis Neg Hx    Crohn's disease Neg Hx    Esophageal cancer Neg Hx    Rectal cancer Neg Hx    Social History[1] Current Outpatient Medications  Medication Sig Dispense Refill   budesonide  (ENTOCORT EC ) 3 MG 24 hr capsule Take 3 capsules (9 mg total) by mouth daily. 90 capsule 0   buPROPion (WELLBUTRIN XL) 150 MG 24 hr tablet Take 150 mg by mouth every morning.     Calcium  Carbonate-Vit D-Min (CALCIUM  1200) 1200-1000 MG-UNIT CHEW Chew 1 tablet by mouth daily. 30 tablet 0   calcium -vitamin D  (OSCAL WITH D) 500-200 MG-UNIT tablet Take 1 tablet by mouth daily with  breakfast. 90 tablet 1   cholecalciferol (VITAMIN D3) 25 MCG (1000 UNIT) tablet Take 1,000 Units by mouth daily.     cyclobenzaprine  (FLEXERIL ) 10 MG tablet Take 10 mg by mouth.     dicyclomine  (BENTYL ) 10 MG capsule Take 1 capsule (10 mg total) by mouth every 12 (twelve) hours as needed for spasms. 60 capsule 1   ferrous sulfate 324 MG TBEC Take 324 mg by mouth every other day.     inFLIXimab  (REMICADE ) 100 MG injection Every 4 weeks     levothyroxine  (SYNTHROID ) 175 MCG tablet Take 175 mcg by mouth daily.     LIVDELZI 10 MG CAPS      methocarbamol (ROBAXIN) 500 MG tablet Take 1,000 mg by mouth 4 (four) times daily.     OVER THE COUNTER MEDICATION Take 200 mg by mouth daily. Magnesium     Selenium 200 MCG CAPS Take by mouth.     No current facility-administered medications for this visit.   Allergies[2]   Review of Systems: All systems reviewed and negative except where noted in HPI.    CT ANGIO CHEST AORTA W/CM & OR WO/CM Result Date: 03/09/2024 CLINICAL DATA:   Acending Aorta Dilitation EXAM: CT ANGIOGRAPHY CHEST WITH CONTRAST TECHNIQUE: Multidetector CT imaging of the chest was performed using the standard protocol during bolus administration of intravenous contrast. Multiplanar CT image reconstructions and MIPs were obtained to evaluate the vascular anatomy. RADIATION DOSE REDUCTION: This exam was performed according to the departmental dose-optimization program which includes automated exposure control, adjustment of the mA and/or kV according to patient size and/or use of iterative reconstruction technique. CONTRAST:  75mL OMNIPAQUE  IOHEXOL  350 MG/ML SOLN COMPARISON:  CT chest, 08/22/2018. Echocardiogram report, 02/09/2024. FINDINGS: CARDIOVASCULAR: Limitations by motion: Moderate Preferential opacification of the thoracic aorta. No evidence of thoracic aortic dissection. Aortic Root: --Valve: 2.3 cm --Sinuses: 3.5 cm --Sinotubular Junction: 3.2 cm Thoracic Aorta: --Ascending Aorta: 3.7 cm --Aortic Arch: 2.7 cm --Descending Aorta: 2.4 cm Other: Normal heart size.  No pericardial effusion. LEFT aortic arch and common bovine variant, with shared origin of the brachiocephalic and LEFT common carotid arteries. No atherosclerosis or hemodynamic stenosis at the origins of the great vessels. Mediastinum/Nodes: No enlarged mediastinal, hilar, or axillary lymph nodes. Thyroid  gland, trachea, and esophagus demonstrate no significant findings. Lungs/Pleura: Lungs are clear without focal consolidation, mass or suspicious pulmonary nodule. No pleural effusion or pneumothorax. Upper Abdomen: No acute abnormality. Mesh repair of a ventral hernia. Small hiatus hernia. Musculoskeletal: No acute chest wall abnormality. No acute osseous findings. Review of the MIP images confirms the above findings. IMPRESSION: 1. No acute vascular or nonvascular abnormality 2. 3.7 cm ectatic dilatation of the ascending thoracic aorta. Nonaneurysmal, without follow-up recommended. This recommendation  follows 2010 ACCF/AHA/AATS/ACR/ASA/SCA/SCAI/SIR/STS/SVM Guidelines for the Diagnosis and Management of Patients With Thoracic Aortic Disease. Circulation. 2010; 121: E266-e369. 3. Ventral hernia repair and small hiatus hernia. Additional incidental, chronic and senescent findings as above. Electronically Signed   By: Thom Hall M.D.   On: 03/09/2024 09:35   ECHOCARDIOGRAM COMPLETE Result Date: 02/09/2024    ECHOCARDIOGRAM REPORT   Patient Name:   ALAJAH WITMAN Schnabel Date of Exam: 02/09/2024 Medical Rec #:  [id number]       Height:       64.0 in Accession #:    [id number]      Weight:       278.0 lb Date of Birth:  03/26/1985      BSA:  2.250 m Patient Age:    38 years        BP:           104/72 mmHg Patient Gender: F               HR:           76 bpm. Exam Location:  [facility] Procedure: 2D Echo, Cardiac Doppler and Color Doppler (Both Spectral and Color            Flow Doppler were utilized during procedure). Indications:    I71.2 Ascending aortic aneurysm  History:        Patient has no prior history of Echocardiogram examinations.                 Chron's disease; Risk Factors:Former Smoker and Morbid obesity.  Sonographer:    Bascom Burows RCS, RVS Referring Phys: [id number] VISHNU P MALLIPEDDI IMPRESSIONS  1. Left ventricular ejection fraction, by estimation, is 60 to 65%. The left ventricle has normal function. The left ventricle has no regional wall motion abnormalities. Left ventricular diastolic parameters were normal.  2. Right ventricular systolic function is normal. The right ventricular size is normal. There is normal pulmonary artery systolic pressure. The estimated right ventricular systolic pressure is 19.6 mmHg.  3. The mitral valve is grossly normal. Trivial mitral valve regurgitation.  4. The aortic valve is likely tricuspid based on off-axis views. Aortic valve regurgitation is not visualized. No aortic stenosis is present.  5. Aortic dilatation noted. There is mild dilatation of the ascending  aorta, measuring 41 mm.  6. The inferior vena cava is normal in size with greater than 50% respiratory variability, suggesting right atrial pressure of 3 mmHg. Comparison(s): No prior Echocardiogram. FINDINGS  Left Ventricle: Left ventricular ejection fraction, by estimation, is 60 to 65%. The left ventricle has normal function. The left ventricle has no regional wall motion abnormalities. The left ventricular internal cavity size was normal in size. There is  borderline left ventricular hypertrophy. Left ventricular diastolic parameters were normal. Right Ventricle: The right ventricular size is normal. No increase in right ventricular wall thickness. Right ventricular systolic function is normal. There is normal pulmonary artery systolic pressure. The tricuspid regurgitant velocity is 2.04 m/s, and  with an assumed right atrial pressure of 3 mmHg, the estimated right ventricular systolic pressure is 19.6 mmHg. Left [facility]: Left atrial size was normal in size. Right [facility]: Right atrial size was normal in size. Pericardium: There is no evidence of pericardial effusion. Mitral Valve: The mitral valve is grossly normal. Trivial mitral valve regurgitation. MV peak gradient, 1.7 mmHg. The mean mitral valve gradient is 1.0 mmHg. Tricuspid Valve: The tricuspid valve is grossly normal. Tricuspid valve regurgitation is trivial. Aortic Valve: The aortic valve is tricuspid. Aortic valve regurgitation is not visualized. No aortic stenosis is present. Aortic valve peak gradient measures 8.2 mmHg. Pulmonic Valve: The pulmonic valve was grossly normal. Pulmonic valve regurgitation is trivial. Aorta: Aortic dilatation noted. There is mild dilatation of the ascending aorta, measuring 41 mm. Venous: The inferior vena cava is normal in size with greater than 50% respiratory variability, suggesting right atrial pressure of 3 mmHg. IAS/Shunts: No atrial level shunt detected by color flow Doppler. Additional Comments: 3D was performed  not requiring image post processing on an independent workstation and was indeterminate.  LEFT VENTRICLE PLAX 2D LVIDd:         3.50 cm     Diastology LVIDs:  2.20 cm     LV e' medial:    8.70 cm/s LV PW:         1.00 cm     LV E/e' medial:  5.6 LV IVS:        1.00 cm     LV e' lateral:   15.70 cm/s LVOT diam:     2.40 cm     LV E/e' lateral: 3.1 LVOT Area:     4.52 cm  LV Volumes (MOD) LV vol d, MOD A2C: 74.0 ml LV vol d, MOD A4C: 90.4 ml LV vol s, MOD A2C: 28.5 ml LV vol s, MOD A4C: 21.6 ml LV SV MOD A2C:     45.5 ml LV SV MOD A4C:     90.4 ml LV SV MOD BP:      58.3 ml RIGHT VENTRICLE RV Basal diam:  2.80 cm     PULMONARY VEINS RV Mid diam:    2.50 cm     Diastolic Velocity: 34.60 cm/s RV S prime:     13.70 cm/s  S/D Velocity:       1.40 TAPSE (M-mode): 3.0 cm      Systolic Velocity:  49.10 cm/s LEFT ATRIUM             Index        RIGHT ATRIUM           Index LA diam:        3.70 cm 1.64 cm/m   RA Area:     11.30 cm LA Vol (A2C):   44.9 ml 19.95 ml/m  RA Volume:   22.20 ml  9.87 ml/m LA Vol (A4C):   39.0 ml 17.33 ml/m LA Biplane Vol: 43.1 ml 19.15 ml/m  AORTIC VALVE                 PULMONIC VALVE AV Area (Vmax): 3.20 cm     PV Vmax:          1.04 m/s AV Vmax:        143.00 cm/s  PV Peak grad:     4.3 mmHg AV Peak Grad:   8.2 mmHg     PR End Diast Vel: 3.15 msec LVOT Vmax:      101.00 cm/s  AORTA Ao Root diam: 3.85 cm Ao Asc diam:  4.10 cm MITRAL VALVE               TRICUSPID VALVE MV Area (PHT): 5.62 cm    TR Peak grad:   16.6 mmHg MV Peak grad:  1.7 mmHg    TR Vmax:        204.00 cm/s MV Mean grad:  1.0 mmHg MV Vmax:       0.65 m/s    SHUNTS MV Vmean:      47.9 cm/s   Systemic Diam: 2.40 cm MV Decel Time: 135 msec MV E velocity: 48.40 cm/s MV A velocity: 59.10 cm/s MV E/A ratio:  0.82 Jayson Sierras MD Electronically signed by Jayson Sierras MD Signature Date/Time: 02/09/2024/1:46:20 PM    Final     Physical Exam: There were no vitals taken for this visit. Constitutional:  Pleasant,well-developed, ***female in no acute distress. HEENT: Normocephalic and atraumatic. Conjunctivae are normal. No scleral icterus. Neck supple.  Cardiovascular: Normal rate, regular rhythm.  Pulmonary/chest: Effort normal and breath sounds normal. No wheezing, rales or rhonchi. Abdominal: Soft, nondistended, nontender. Bowel sounds active throughout. There are no masses palpable. No hepatomegaly. Extremities: no edema Lymphadenopathy:  No cervical adenopathy noted. Neurological: Alert and oriented to person place and time. Skin: Skin is warm and dry. No rashes noted. Psychiatric: Normal mood and affect. Behavior is normal.   ASSESSMENT: 39 y.o. female here for assessment of the following  No diagnosis found.  PLAN:   Job [place]olt, [place]    [1]  Social History Tobacco Use   Smoking status: Former    Current packs/day: 0.00    Average packs/day: 0.3 packs/day for 8.0 years (2.0 ttl pk-yrs)    Types: Cigarettes    Start date: 10/07/2002    Quit date: 10/07/2010    Years since quitting: 13.4   Smokeless tobacco: Never  Vaping Use   Vaping status: Never Used  Substance Use Topics   Alcohol use: Yes    Comment: rare   Drug use: No  [2]  Allergies Allergen Reactions   Other     Fluoroquinolones   Imitrex [Sumatriptan] Other (See Comments)    Makes migraines worse.    Maxalt [Rizatriptan] Other (See Comments)    Makes migraines worse   Phenergan [Promethazine Hcl] Nausea And Vomiting    Pill form; IV ok   Tape Other (See Comments)    Blisters on abdomen after C Section   "

## 2024-03-10 ENCOUNTER — Encounter: Payer: Self-pay | Attending: Gastroenterology | Admitting: *Deleted

## 2024-03-10 VITALS — BP 100/68 | HR 67 | Temp 98.0°F | Resp 16 | Wt 278.0 lb

## 2024-03-10 DIAGNOSIS — K50818 Crohn's disease of both small and large intestine with other complication: Secondary | ICD-10-CM | POA: Diagnosis present

## 2024-03-10 MED ORDER — SODIUM CHLORIDE 0.9 % IV SOLN
10.0000 mg/kg | Freq: Once | INTRAVENOUS | Status: AC
  Administered 2024-03-10: 1300 mg via INTRAVENOUS
  Filled 2024-03-10: qty 130

## 2024-03-10 MED ORDER — DIPHENHYDRAMINE HCL 25 MG PO CAPS
25.0000 mg | ORAL_CAPSULE | Freq: Once | ORAL | Status: AC
  Administered 2024-03-10: 25 mg via ORAL

## 2024-03-10 MED ORDER — METHYLPREDNISOLONE SODIUM SUCC 40 MG IJ SOLR
40.0000 mg | Freq: Once | INTRAMUSCULAR | Status: AC
  Administered 2024-03-10: 40 mg via INTRAVENOUS

## 2024-03-10 MED ORDER — ACETAMINOPHEN 325 MG PO TABS
650.0000 mg | ORAL_TABLET | Freq: Once | ORAL | Status: AC
  Administered 2024-03-10: 650 mg via ORAL

## 2024-03-10 NOTE — Progress Notes (Signed)
 Diagnosis:  Crohn's Disease  Provider:  Leigh Standing MD  Procedure: IV Infusion  IV Type: Peripheral, IV Location: R Antecubital  Remicade  (Infliximab ), Dose: 1300 mg  Infusion Start Time: 1143  Infusion Stop Time: 1244  Post Infusion IV Care: Peripheral IV Discontinued  Discharge: Condition: Good, Destination: Home . AVS Provided  Performed by:  Baldwin Darice Helling, RN

## 2024-03-16 ENCOUNTER — Encounter: Payer: Self-pay | Admitting: Oncology

## 2024-03-16 ENCOUNTER — Encounter: Payer: Self-pay | Admitting: Gastroenterology

## 2024-03-18 ENCOUNTER — Inpatient Hospital Stay: Attending: Hematology

## 2024-03-18 DIAGNOSIS — K50818 Crohn's disease of both small and large intestine with other complication: Secondary | ICD-10-CM

## 2024-03-18 DIAGNOSIS — E611 Iron deficiency: Secondary | ICD-10-CM

## 2024-03-18 LAB — CBC WITH DIFFERENTIAL/PLATELET
Abs Immature Granulocytes: 0.04 10*3/uL (ref 0.00–0.07)
Basophils Absolute: 0.2 10*3/uL — ABNORMAL HIGH (ref 0.0–0.1)
Basophils Relative: 1 %
Eosinophils Absolute: 0.2 10*3/uL (ref 0.0–0.5)
Eosinophils Relative: 1 %
HCT: 43.7 % (ref 36.0–46.0)
Hemoglobin: 13.7 g/dL (ref 12.0–15.0)
Immature Granulocytes: 0 %
Lymphocytes Relative: 42 %
Lymphs Abs: 5.8 10*3/uL — ABNORMAL HIGH (ref 0.7–4.0)
MCH: 29.8 pg (ref 26.0–34.0)
MCHC: 31.4 g/dL (ref 30.0–36.0)
MCV: 95.2 fL (ref 80.0–100.0)
Monocytes Absolute: 0.9 10*3/uL (ref 0.1–1.0)
Monocytes Relative: 7 %
Neutro Abs: 6.8 10*3/uL (ref 1.7–7.7)
Neutrophils Relative %: 49 %
Platelets: 644 10*3/uL — ABNORMAL HIGH (ref 150–400)
RBC: 4.59 MIL/uL (ref 3.87–5.11)
RDW: 13.5 % (ref 11.5–15.5)
Smear Review: NORMAL
WBC: 13.8 10*3/uL — ABNORMAL HIGH (ref 4.0–10.5)
nRBC: 0 % (ref 0.0–0.2)

## 2024-03-18 LAB — IRON AND TIBC
Iron: 77 ug/dL (ref 28–170)
Saturation Ratios: 26 % (ref 10.4–31.8)
TIBC: 291 ug/dL (ref 250–450)
UIBC: 214 ug/dL

## 2024-03-18 LAB — COMPREHENSIVE METABOLIC PANEL WITH GFR
ALT: 38 U/L (ref 0–44)
AST: 48 U/L — ABNORMAL HIGH (ref 15–41)
Albumin: 3.8 g/dL (ref 3.5–5.0)
Alkaline Phosphatase: 225 U/L — ABNORMAL HIGH (ref 38–126)
Anion gap: 14 (ref 5–15)
BUN: 5 mg/dL — ABNORMAL LOW (ref 6–20)
CO2: 21 mmol/L — ABNORMAL LOW (ref 22–32)
Calcium: 8.6 mg/dL — ABNORMAL LOW (ref 8.9–10.3)
Chloride: 105 mmol/L (ref 98–111)
Creatinine, Ser: 0.71 mg/dL (ref 0.44–1.00)
GFR, Estimated: >60 mL/min
Glucose, Bld: 95 mg/dL (ref 70–99)
Potassium: 3.9 mmol/L (ref 3.5–5.1)
Sodium: 140 mmol/L (ref 135–145)
Total Bilirubin: 0.4 mg/dL (ref 0.0–1.2)
Total Protein: 7.2 g/dL (ref 6.5–8.1)

## 2024-03-18 LAB — FOLATE: Folate: 6.8 ng/mL

## 2024-03-18 LAB — FERRITIN: Ferritin: 104 ng/mL (ref 11–307)

## 2024-03-18 LAB — VITAMIN B12: Vitamin B-12: 495 pg/mL (ref 180–914)

## 2024-03-19 NOTE — Telephone Encounter (Signed)
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 No indication to repeat CT scans. Bovine variant is noted, benign finding. Offer telephone visit to discuss results, APP/MD.

## 2024-03-19 NOTE — Telephone Encounter (Signed)
 Patient requested telephone visit, sent to scheduling.  Littie CHRISTELLA Croak, CMA 03/19/2024 5:12 PM

## 2024-03-25 ENCOUNTER — Inpatient Hospital Stay: Admitting: Oncology

## 2024-03-29 ENCOUNTER — Ambulatory Visit: Admitting: Gastroenterology

## 2024-05-05 ENCOUNTER — Ambulatory Visit
